# Patient Record
Sex: Male | Born: 1950 | Race: White | Hispanic: No | Marital: Married | Smoking: Former smoker
Health system: Southern US, Community
[De-identification: ages and names within clinical notes are randomized; demographics above are authoritative.]

## PROBLEM LIST (undated history)

## (undated) DIAGNOSIS — Z8709 Personal history of other diseases of the respiratory system: Secondary | ICD-10-CM

## (undated) DIAGNOSIS — M1990 Unspecified osteoarthritis, unspecified site: Secondary | ICD-10-CM

## (undated) DIAGNOSIS — I779 Disorder of arteries and arterioles, unspecified: Secondary | ICD-10-CM

## (undated) DIAGNOSIS — E039 Hypothyroidism, unspecified: Secondary | ICD-10-CM

## (undated) DIAGNOSIS — K760 Fatty (change of) liver, not elsewhere classified: Secondary | ICD-10-CM

## (undated) DIAGNOSIS — I209 Angina pectoris, unspecified: Secondary | ICD-10-CM

## (undated) DIAGNOSIS — G43909 Migraine, unspecified, not intractable, without status migrainosus: Secondary | ICD-10-CM

## (undated) DIAGNOSIS — IMO0001 Reserved for inherently not codable concepts without codable children: Secondary | ICD-10-CM

## (undated) DIAGNOSIS — Z973 Presence of spectacles and contact lenses: Secondary | ICD-10-CM

## (undated) DIAGNOSIS — G629 Polyneuropathy, unspecified: Secondary | ICD-10-CM

## (undated) DIAGNOSIS — E7800 Pure hypercholesterolemia, unspecified: Secondary | ICD-10-CM

## (undated) DIAGNOSIS — E049 Nontoxic goiter, unspecified: Secondary | ICD-10-CM

## (undated) DIAGNOSIS — J45909 Unspecified asthma, uncomplicated: Secondary | ICD-10-CM

## (undated) DIAGNOSIS — I10 Essential (primary) hypertension: Secondary | ICD-10-CM

## (undated) DIAGNOSIS — I2510 Atherosclerotic heart disease of native coronary artery without angina pectoris: Secondary | ICD-10-CM

## (undated) DIAGNOSIS — E782 Mixed hyperlipidemia: Secondary | ICD-10-CM

## (undated) DIAGNOSIS — I6529 Occlusion and stenosis of unspecified carotid artery: Secondary | ICD-10-CM

## (undated) DIAGNOSIS — K219 Gastro-esophageal reflux disease without esophagitis: Secondary | ICD-10-CM

## (undated) DIAGNOSIS — Z8701 Personal history of pneumonia (recurrent): Secondary | ICD-10-CM

## (undated) DIAGNOSIS — S3992XA Unspecified injury of lower back, initial encounter: Secondary | ICD-10-CM

## (undated) DIAGNOSIS — B9681 Helicobacter pylori [H. pylori] as the cause of diseases classified elsewhere: Secondary | ICD-10-CM

## (undated) DIAGNOSIS — K279 Peptic ulcer, site unspecified, unspecified as acute or chronic, without hemorrhage or perforation: Secondary | ICD-10-CM

## (undated) DIAGNOSIS — I639 Cerebral infarction, unspecified: Secondary | ICD-10-CM

## (undated) DIAGNOSIS — J449 Chronic obstructive pulmonary disease, unspecified: Secondary | ICD-10-CM

## (undated) DIAGNOSIS — Z87442 Personal history of urinary calculi: Secondary | ICD-10-CM

## (undated) DIAGNOSIS — J189 Pneumonia, unspecified organism: Secondary | ICD-10-CM

## (undated) HISTORY — DX: Cerebral infarction, unspecified: I63.9

## (undated) HISTORY — PX: CARPAL TUNNEL RELEASE: SHX101

## (undated) HISTORY — DX: Essential (primary) hypertension: I10

## (undated) HISTORY — DX: Personal history of pneumonia (recurrent): Z87.01

## (undated) HISTORY — DX: Fatty (change of) liver, not elsewhere classified: K76.0

## (undated) HISTORY — DX: Hypothyroidism, unspecified: E03.9

## (undated) HISTORY — DX: Peptic ulcer, site unspecified, unspecified as acute or chronic, without hemorrhage or perforation: K27.9

## (undated) HISTORY — DX: Pure hypercholesterolemia, unspecified: E78.00

## (undated) HISTORY — DX: Occlusion and stenosis of unspecified carotid artery: I65.29

## (undated) HISTORY — DX: Disorder of arteries and arterioles, unspecified: I77.9

## (undated) HISTORY — PX: FRACTURE SURGERY: SHX138

## (undated) HISTORY — DX: Helicobacter pylori (H. pylori) as the cause of diseases classified elsewhere: B96.81

## (undated) HISTORY — DX: Polyneuropathy, unspecified: G62.9

## (undated) HISTORY — DX: Nontoxic goiter, unspecified: E04.9

## (undated) HISTORY — DX: Mixed hyperlipidemia: E78.2

## (undated) HISTORY — PX: CAROTID ENDARTERECTOMY: SUR193

## (undated) HISTORY — PX: OTHER SURGICAL HISTORY: SHX169

## (undated) HISTORY — DX: Chronic obstructive pulmonary disease, unspecified: J44.9

## (undated) HISTORY — DX: Atherosclerotic heart disease of native coronary artery without angina pectoris: I25.10

## (undated) HISTORY — DX: Angina pectoris, unspecified: I20.9

---
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
## 1990-11-20 HISTORY — PX: SHOULDER OPEN ROTATOR CUFF REPAIR: SHX2407

## 1997-11-20 HISTORY — PX: SHOULDER ARTHROSCOPY W/ ROTATOR CUFF REPAIR: SHX2400

## 1998-11-20 HISTORY — PX: COLECTOMY: SHX59

## 2001-01-18 ENCOUNTER — Encounter: Payer: Self-pay | Admitting: Emergency Medicine

## 2001-01-18 ENCOUNTER — Emergency Department (HOSPITAL_COMMUNITY): Admission: EM | Admit: 2001-01-18 | Discharge: 2001-01-18 | Payer: Self-pay | Admitting: Emergency Medicine

## 2001-01-28 ENCOUNTER — Encounter: Payer: Self-pay | Admitting: Orthopedic Surgery

## 2001-01-29 ENCOUNTER — Ambulatory Visit (HOSPITAL_COMMUNITY): Admission: RE | Admit: 2001-01-29 | Discharge: 2001-01-30 | Payer: Self-pay | Admitting: Orthopedic Surgery

## 2001-01-29 ENCOUNTER — Encounter: Payer: Self-pay | Admitting: Orthopedic Surgery

## 2001-03-27 ENCOUNTER — Ambulatory Visit (HOSPITAL_COMMUNITY): Admission: RE | Admit: 2001-03-27 | Discharge: 2001-03-27 | Payer: Self-pay | Admitting: Orthopedic Surgery

## 2001-07-16 ENCOUNTER — Ambulatory Visit (HOSPITAL_COMMUNITY): Admission: RE | Admit: 2001-07-16 | Discharge: 2001-07-16 | Payer: Self-pay | Admitting: Orthopedic Surgery

## 2001-10-20 ENCOUNTER — Encounter: Payer: Self-pay | Admitting: Internal Medicine

## 2001-10-20 ENCOUNTER — Emergency Department (HOSPITAL_COMMUNITY): Admission: EM | Admit: 2001-10-20 | Discharge: 2001-10-20 | Payer: Self-pay | Admitting: Internal Medicine

## 2001-11-20 DIAGNOSIS — S3992XA Unspecified injury of lower back, initial encounter: Secondary | ICD-10-CM

## 2001-11-20 HISTORY — DX: Unspecified injury of lower back, initial encounter: S39.92XA

## 2002-01-30 ENCOUNTER — Ambulatory Visit (HOSPITAL_COMMUNITY): Admission: RE | Admit: 2002-01-30 | Discharge: 2002-01-30 | Payer: Self-pay | Admitting: Internal Medicine

## 2002-01-30 ENCOUNTER — Encounter: Payer: Self-pay | Admitting: Internal Medicine

## 2002-05-29 ENCOUNTER — Encounter: Payer: Self-pay | Admitting: Family Medicine

## 2002-05-29 ENCOUNTER — Ambulatory Visit (HOSPITAL_COMMUNITY): Admission: RE | Admit: 2002-05-29 | Discharge: 2002-05-29 | Payer: Self-pay | Admitting: Family Medicine

## 2002-06-20 HISTORY — PX: ORIF SHOULDER FRACTURE: SHX5035

## 2002-06-26 ENCOUNTER — Emergency Department (HOSPITAL_COMMUNITY): Admission: EM | Admit: 2002-06-26 | Discharge: 2002-06-26 | Payer: Self-pay | Admitting: Emergency Medicine

## 2002-06-26 ENCOUNTER — Encounter: Payer: Self-pay | Admitting: General Surgery

## 2002-06-26 ENCOUNTER — Inpatient Hospital Stay (HOSPITAL_COMMUNITY): Admission: EM | Admit: 2002-06-26 | Discharge: 2002-07-03 | Payer: Self-pay | Admitting: *Deleted

## 2002-06-26 ENCOUNTER — Encounter: Payer: Self-pay | Admitting: Emergency Medicine

## 2002-06-27 ENCOUNTER — Encounter: Payer: Self-pay | Admitting: Orthopedic Surgery

## 2002-06-30 ENCOUNTER — Encounter: Payer: Self-pay | Admitting: Orthopedic Surgery

## 2003-02-19 ENCOUNTER — Encounter: Payer: Self-pay | Admitting: Orthopedic Surgery

## 2003-02-24 ENCOUNTER — Ambulatory Visit (HOSPITAL_COMMUNITY): Admission: RE | Admit: 2003-02-24 | Discharge: 2003-02-25 | Payer: Self-pay | Admitting: Orthopedic Surgery

## 2003-02-24 ENCOUNTER — Encounter: Payer: Self-pay | Admitting: Orthopedic Surgery

## 2004-08-02 ENCOUNTER — Ambulatory Visit (HOSPITAL_COMMUNITY): Admission: RE | Admit: 2004-08-02 | Discharge: 2004-08-02 | Payer: Self-pay | Admitting: Family Medicine

## 2004-08-08 ENCOUNTER — Ambulatory Visit (HOSPITAL_COMMUNITY): Admission: RE | Admit: 2004-08-08 | Discharge: 2004-08-08 | Payer: Self-pay | Admitting: Family Medicine

## 2004-08-24 ENCOUNTER — Ambulatory Visit (HOSPITAL_COMMUNITY): Admission: RE | Admit: 2004-08-24 | Discharge: 2004-08-24 | Payer: Self-pay | Admitting: Internal Medicine

## 2004-08-24 HISTORY — PX: COLONOSCOPY: SHX174

## 2006-02-26 ENCOUNTER — Ambulatory Visit (HOSPITAL_COMMUNITY): Admission: RE | Admit: 2006-02-26 | Discharge: 2006-02-26 | Payer: Self-pay | Admitting: Family Medicine

## 2007-05-13 ENCOUNTER — Emergency Department (HOSPITAL_COMMUNITY): Admission: EM | Admit: 2007-05-13 | Discharge: 2007-05-13 | Payer: Self-pay | Admitting: Emergency Medicine

## 2008-02-21 ENCOUNTER — Ambulatory Visit (HOSPITAL_COMMUNITY): Admission: RE | Admit: 2008-02-21 | Discharge: 2008-02-21 | Payer: Self-pay | Admitting: Family Medicine

## 2008-02-26 ENCOUNTER — Ambulatory Visit: Payer: Self-pay | Admitting: Vascular Surgery

## 2008-03-09 ENCOUNTER — Inpatient Hospital Stay (HOSPITAL_COMMUNITY): Admission: RE | Admit: 2008-03-09 | Discharge: 2008-03-10 | Payer: Self-pay | Admitting: Vascular Surgery

## 2008-03-09 ENCOUNTER — Encounter: Payer: Self-pay | Admitting: Vascular Surgery

## 2008-03-09 ENCOUNTER — Ambulatory Visit: Payer: Self-pay | Admitting: Vascular Surgery

## 2008-03-27 ENCOUNTER — Ambulatory Visit: Payer: Self-pay | Admitting: Vascular Surgery

## 2008-04-21 ENCOUNTER — Ambulatory Visit (HOSPITAL_COMMUNITY): Admission: RE | Admit: 2008-04-21 | Discharge: 2008-04-21 | Payer: Self-pay | Admitting: Family Medicine

## 2008-10-02 ENCOUNTER — Ambulatory Visit: Payer: Self-pay | Admitting: Vascular Surgery

## 2009-01-25 ENCOUNTER — Emergency Department (HOSPITAL_COMMUNITY): Admission: EM | Admit: 2009-01-25 | Discharge: 2009-01-25 | Payer: Self-pay | Admitting: Emergency Medicine

## 2009-10-22 ENCOUNTER — Ambulatory Visit: Payer: Self-pay | Admitting: Vascular Surgery

## 2010-11-25 ENCOUNTER — Ambulatory Visit: Admit: 2010-11-25 | Payer: Self-pay | Admitting: Vascular Surgery

## 2011-02-17 ENCOUNTER — Other Ambulatory Visit (HOSPITAL_COMMUNITY): Payer: Self-pay | Admitting: Family Medicine

## 2011-02-17 ENCOUNTER — Ambulatory Visit (HOSPITAL_COMMUNITY)
Admission: RE | Admit: 2011-02-17 | Discharge: 2011-02-17 | Disposition: A | Discharge status: Home / Self Care | Payer: Self-pay | Source: Ambulatory Visit | Attending: Family Medicine | Admitting: Family Medicine

## 2011-02-17 DIAGNOSIS — S298XXA Other specified injuries of thorax, initial encounter: Secondary | ICD-10-CM | POA: Insufficient documentation

## 2011-02-17 DIAGNOSIS — R079 Chest pain, unspecified: Secondary | ICD-10-CM

## 2011-02-17 DIAGNOSIS — W19XXXA Unspecified fall, initial encounter: Secondary | ICD-10-CM | POA: Insufficient documentation

## 2011-02-17 DIAGNOSIS — R0789 Other chest pain: Secondary | ICD-10-CM | POA: Insufficient documentation

## 2011-03-02 LAB — BASIC METABOLIC PANEL
BUN: 15 mg/dL (ref 6–23)
CO2: 27 mEq/L (ref 19–32)
Chloride: 101 mEq/L (ref 96–112)
Creatinine, Ser: 0.84 mg/dL (ref 0.4–1.5)
Potassium: 4.3 mEq/L (ref 3.5–5.1)

## 2011-03-02 LAB — POCT CARDIAC MARKERS

## 2011-03-02 LAB — CBC
HCT: 44.3 % (ref 39.0–52.0)
MCHC: 34.7 g/dL (ref 30.0–36.0)
MCV: 90.5 fL (ref 78.0–100.0)
Platelets: 178 10*3/uL (ref 150–400)
RBC: 4.9 MIL/uL (ref 4.22–5.81)

## 2011-03-02 LAB — D-DIMER, QUANTITATIVE: D-Dimer, Quant: 0.29 ug/mL-FEU (ref 0.00–0.48)

## 2011-04-04 NOTE — Op Note (Signed)
Seth Savage, PAYER               ACCOUNT NO.:  [id number]   MEDICAL RECORD NO.:  [id number]          PATIENT TYPE:  INP   LOCATION:  3301                         FACILITY:  [facility]   PHYSICIAN:  Larina Earthly, M.D.    DATE OF BIRTH:  1951-08-12   DATE OF PROCEDURE:  03/09/2008  DATE OF DISCHARGE:                               OPERATIVE REPORT   PREOPERATIVE DIAGNOSIS:  Left carotid stenosis, severe, asymptomatic.   POSTOPERATIVE DIAGNOSIS:  Left carotid stenosis, severe, asymptomatic.   PROCEDURE:  Left carotid endarterectomy and Dacron patch angioplasty.   SURGEON:  Larina Earthly, MD.   ASSISTANTS:  1. Di Kindle. Edilia Bo, MD.  2. Wilmon Arms, PA-C.   ANESTHESIA:  General endotracheal.   COMPLICATIONS:  None.   DISPOSITION:  To recovery room, neurologically intact.   PROCEDURE IN DETAIL:  The patient was taken to the operating room,  placed in supine position.  The left neck was prepped and draped in  usual sterile fashion.  Incision was made in the anterior  sternocleidomastoid and carried down through the platysmal left cautery.  Sternocleidomastoid was reflected posteriorly and the carotid sheath was  opened.  Facial vein was ligated with 2-0 silk ties and divided.  The  common carotid artery was encircled with an umbilical tape, Rumel  tourniquet.  Dissection was carried on to the bifurcation.  The vagus  and hypoglossal nerves were identified and preserved.  The superior  thyroid artery was encircled with a 2-0 silk Potts tie, the external  carotid circled with a blue vessel loop, the internal carotid circled  with umbilical tape and Rumel tourniquet.  The patient was given 9000  units of intravenous heparin.  After adequate circulation time, the  internal and external common carotid arteries were occluded.  The common  carotid artery was opened with an 11 blade extending using with Potts  scissors through the plaque on to the internal carotid with Potts  scissors.  A 10 shunt was passed up the internal carotid artery, allowed  the back bleed, then down the common carotid artery where it was secured  the Rumel tourniquets.  The endarterectomy was begun on the common  carotid artery and the proximal plaques were divided with Potts  scissors.  The endarterectomy was continued onto the bifurcation.  The  external carotid was endarterectomized with eversion technique and the  internal carotid was endarterectomized in an open fashion.  Remaining  atheromatous debris was removed from endarterectomy plane.  The Finesse  Hemashield Dacron patch was brought into the field and sewn as patch  angioplasty with a running 6-0 Prolene suture.  Prior to completion of  the anastomosis, the flushing maneuvers were undertaken.  The  anastomosis was completed.  Flow was restored first to the external,  then the internal carotid artery.  Excellent flow characteristics were  noted with handheld Doppler in the internal and external carotid  arteries.  The patient was given 50 mg of protamine to reverse heparin.  The wounds were irrigated with saline.  Hemostasis with electrocautery.  Wounds were closed with 3-0 Vicryl reapproximated  sternocleidomastoid with the carotid sheath.  Next, platysma was closed  with running 3-0 Vicryl suture and following this, the skin was closed  with 4-0 subcuticular Vicryl stitch.  Sterile dressing was applied.  The  patient was taken to the recovery in stable condition.      Larina Earthly, M.D.  Electronically Signed     TFE/MEDQ  D:  03/09/2008  T:  03/10/2008  Job:  [id number]   cc:   Kirk Ruths, M.D.  Madelin Rear. Sherwood Gambler, MD

## 2011-04-04 NOTE — Assessment & Plan Note (Signed)
OFFICE VISIT   Driggs, Atanacio S  DOB:  [birth year]                                       10/02/2008  ZOXWR#:[id number]   The patient presents today for follow-up of his left carotid  endarterectomy with Dacron patch angioplasty on 03/09/2008.  He  continues to do quite well.  He has continued his smoking cessation.  He  reports no neurologic deficits.  He does report an occasional tingling  sensation in his left anterior chest but does not relate this to  exercise.  His health status is otherwise unchanged.   PHYSICAL EXAM:  He is a well-developed, well-nourished white male  appearing stated age 60.  His blood pressure is 142/88, pulse 73,  respirations 18.  His radial pulses are 2+ bilaterally.  He is grossly  intact neurologically.  His left neck incision is well-healed and he has  no carotid bruits bilaterally.   He underwent carotid duplex in our office and this reveals a wide  patency of his left endarterectomy with no evidence of restenosis.  He  does have a known 40-60% stenosis in his right internal carotid artery.  He will continue his usual activities and we will see him in our  vascular lab protocol at yearly intervals.  He will notify us  immediately should he develop any new neurologic deficits.   Larina Earthly, M.D.  Electronically Signed   TFE/MEDQ  D:  10/02/2008  T:  10/05/2008  Job:  [id number]   cc:   Kirk Ruths, M.D.

## 2011-04-04 NOTE — Procedures (Signed)
CAROTID DUPLEX EXAM   INDICATION:  Followup known carotid artery disease.   HISTORY:  Diabetes:  No.  Cardiac:  No.  Hypertension:  Yes.  Smoking:  Yes.  Previous Surgery:  No.  CV History:  No.  Amaurosis Fugax No, Paresthesias No, Hemiparesis No                                       RIGHT             LEFT  Brachial systolic pressure:         118               118  Brachial Doppler waveforms:         Biphasic          Biphasic  Vertebral direction of flow:        Antegrade         Antegrade  DUPLEX VELOCITIES (cm/sec)  CCA peak systolic                   88                83  ECA peak systolic                   178               224  ICA peak systolic                   124               327  ICA end diastolic                   37                118  PLAQUE MORPHOLOGY:                  Heterogenous      Heterogenous  PLAQUE AMOUNT:                      Mild              Severe  PLAQUE LOCATION:                    ICA and ECA       ICA and ECA   IMPRESSION:  1. 80-99% stenosis noted at the left bifurcation/proximal ICA.  2. 40-59% stenosis noted in the right ICA.  3. Antegrade bilateral vertebral arteries.   ___________________________________________  Larina Earthly, M.D.   MG/MEDQ  D:  02/26/2008  T:  02/26/2008  Job:  [id number]

## 2011-04-04 NOTE — Discharge Summary (Signed)
Seth Savage, Seth Savage               ACCOUNT NO.:  [id number]   MEDICAL RECORD NO.:  [id number]          PATIENT TYPE:  INP   LOCATION:  3301                         FACILITY:  [facility]   PHYSICIAN:  Larina Earthly, M.D.    DATE OF BIRTH:  12-09-1950   DATE OF ADMISSION:  03/09/2008  DATE OF DISCHARGE:  03/10/2008                               DISCHARGE SUMMARY   REFERRING CARDIOLOGIST:  Nanetta Batty, M.D.   DISCHARGE DIAGNOSES:  1. Left carotid occlusive disease.  2. Hypothyroid.  3. History of migraines.  4. History of tobacco abuse.   PROCEDURE PERFORMED:  March 09, 2008, left carotid endarterectomy by Dr.  Arbie Cookey.   DISCHARGE MEDICATIONS:  1. Synthroid 150 mcg p.o. daily.  2. Benicar 200 mg p.o. daily.  3. Vytorin 10/20 one p.o. nightly.  4. Aspirin 325 mg p.o. daily.  5. Percocet 5/325 one p.o. q.4 h. p.r.n. pain, 20 were given.   DISPOSITION:  He has been discharged home with his wounds healing well.  He is instructed not to drive for 3 weeks or lift heavy objects for 3  weeks.  He is to clean his wounds with soap and warm water.  He is to  observe the wound for drainage, increasing redness, pain, fever greater  than 101.2, or neurological changes.  He is to return to see Dr. Arbie Cookey  in 2 weeks in the office, we will call him for an appointment.   BRIEF IDENTIFYING STATEMENT:  For complete details, please refer to the  typed history and physical.  Briefly, this very pleasant 60 year old  gentleman was referred to Dr. Arbie Cookey with traumatic left carotid  occlusive disease.  Dr. Arbie Cookey evaluated him and found him to be a  suitable candidate for left carotid endarterectomy.  He was informed of  the risks and benefits of the procedure, and after careful  consideration, elected to proceed with surgery.   HOSPITAL COURSE:  Preoperative workup was completed as an outpatient.  He was brought in through [facility] and underwent the  aforementioned left carotid endarterectomy.   For complete details,  please refer to the typed operative report.  Procedure was without  complications.  He was returned to the  postanesthesia care unit,  extubated.  Following stabilization, he was transferred to a bed on a  surgical stepdown unit.  He was observed overnight.  The following  morning, his wound was healing well.  Neurologically, he was intact,  although he did have a very slight deviation of his tongue to the left.  He was found stable and was discharged home.      Wilmon Arms, PA      Larina Earthly, M.D.  Electronically Signed    KEL/MEDQ  D:  03/10/2008  T:  03/10/2008  Job:  [id number]   cc:   Kirk Ruths, M.D.  Madelin Rear. Sherwood Gambler, MD

## 2011-04-04 NOTE — Assessment & Plan Note (Signed)
OFFICE VISIT   Seth Savage, Seth Savage  DOB:  [birth year]                                       03/27/2008  ZOXWR#:[id number]   The patient presents today for followup of his left carotid  endarterectomy on 03/09/2008.  He did well and was discharged to home on  postoperative day 1.  He does have the usual amount of peri-incisional  soreness.  He did have a recurrence of migraine headache following his  endarterectomy and this has now resolved.  He does report that he is no  longer having pulsatile tenderness that he had preop.   PHYSICAL EXAMINATION:  His neck is well-healed.  He has no bruits.  He  is grossly intact neurologically.   He will continue his usual activity.  I plan to see him again in 6  months with repeat carotid duplex at that time.   Larina Earthly, M.D.  Electronically Signed   TFE/MEDQ  D:  03/27/2008  T:  03/28/2008  Job:  [id number]   cc:   Madelin Rear. Sherwood Gambler, MD

## 2011-04-04 NOTE — H&P (Signed)
HISTORY AND PHYSICAL EXAMINATION   February 26, 2008   Re:  Seth, Savage               DOB:  1951-09-10   ADMITTING DIAGNOSIS:  Severe asymptomatic left internal carotid artery  stenosis.   HISTORY OF PRESENT ILLNESS:  The patient is an active, healthy 60-year-  old white male who was found to have carotid bruit on physical  examination.  He underwent carotid duplex at [facility] and  this revealed critical left internal carotid artery stenosis and mild to  moderate right carotid stenosis.  He denies any prior symptoms related  to this, specifically any amaurosis fugax, transient ischemic attack or  stroke.  He has had some bilateral blurred vision in the past which he  attributes to medication.  He denies any prior cardiac history.  He is  hypertensive on medication and also has elevated cholesterol on  medication for this as well.   FAMILY HISTORY:  He denies any premature atherosclerotic disease in his  family.  He did have a brother with stroke secondary to trauma.   SOCIAL HISTORY:  He is married.  He works as a Music therapist.  He does smoke  currently.  He does drink approximately 3 beers per month.   REVIEW OF SYSTEMS:  CONSTITUTIONAL:  His weight is stated at 218 pounds.  Height is 6 feet tall.  CARDIOVASCULAR:  Noted for some occasional palpitations.  He has no  pulmonary, GI or GU symptoms.  NEUROLOGICAL:  He occasionally has dizziness.  ORTHOPEDICS:  He has had multiple orthopedic injuries and arthritic  changes.   MEDICATION ALLERGIES:  LYRICA.   CURRENT MEDICATIONS:  Synthroid, Vytorin and an unknown antihypertensive  medication.   PHYSICAL EXAMINATION:  General:  A well-developed, well-nourished white  male appearing his stated age of 48.  Vital signs:  His blood pressure  122/78 in his left arm and 126/83 in his right arm.  Pulse is 80,  respirations 18.  Neurological:  He is grossly intact neurologically.  His carotid arteries do  show a soft left bruit, no bruit on the right.  Radial pulses are 2+.  He has 2+ femoral and 2+ dorsalis pedis pulses  bilaterally.  Heart:  Regular rate and rhythm without murmur.  Chest:  Clear bilaterally without any rales.  Abdomen:  Exam reveals no  aneurysm.  He has no organomegaly and no tenderness.   I discussed this with the patient.  I explained the significance of his  severe asymptomatic left carotid disease.  He is right-handed.  I  explained this puts his dominant hemisphere at risk.  I explained that  we currently are proceeding with carotid endarterectomy if we can  confirm normal internal carotid artery above the level of plaque.  He  underwent confirmatory duplex in our office and this did concur with  severe stenosis in his left internal carotid artery and normal internal  carotid distal to this.  I have recommended endarterectomy for reduction  of stroke risk.  I explained the procedure to include 1-2% risk of  stroke with surgery and also very low risk for cranial nerve injury.  He  understands and wishes to proceed.  We will schedule this at his  convenience on April 20 for admission day surgery at [facility].  He understands anticipation of discharge on postoperative day  1.  He will continue his aspirin therapy up until the time of surgery.   Tawanna Cooler  Shirline Frees, M.D.  Electronically Signed   TFE/MEDQ  D:  02/26/2008  T:  02/27/2008  Job:  [id number]   cc:   Kirk Ruths, M.D.  Madelin Rear. Sherwood Gambler, MD

## 2011-04-04 NOTE — Procedures (Signed)
CAROTID DUPLEX EXAM   INDICATION:  Follow-up left carotid endarterectomy.   HISTORY:  Diabetes:  No.  Cardiac:  No.  Hypertension:  Yes.  Smoking:  Yes.  Previous Surgery:  Left carotid endarterectomy.  CV History:  Amaurosis Fugax No, Paresthesias No, Hemiparesis No.                                       RIGHT             LEFT  Brachial systolic pressure:         120               122  Brachial Doppler waveforms:         Biphasic          Biphasic  Vertebral direction of flow:        Antegrade         Antegrade  DUPLEX VELOCITIES (cm/sec)  CCA peak systolic                   104               147  ECA peak systolic                   246               100  ICA peak systolic                   143               84  ICA end diastolic                   36                36  PLAQUE MORPHOLOGY:                  Heterogeneous     None  PLAQUE AMOUNT:                      Moderate          None  PLAQUE LOCATION:                    ICA and ECA       None   IMPRESSION:  1. A 40-59% stenosis noted in the right ICA.  2. Normal carotid duplex noted in the left ICA.  3. Status post left carotid endarterectomy.  4. Antegrade bilateral vertebral arteries.   ___________________________________________  Larina Earthly, M.D.   MG/MEDQ  D:  10/02/2008  T:  10/02/2008  Job:  [id number]

## 2011-04-04 NOTE — Procedures (Signed)
CAROTID DUPLEX EXAM   INDICATION:  Follow up of carotid disease.   HISTORY:  Diabetes:  No.  Cardiac:  No.  Hypertension:  Yes.  Smoking:  Yes.  Previous Surgery:  Left carotid endarterectomy.  CV History:  Asymptomatic.  Amaurosis Fugax No, Paresthesias No, Hemiparesis No.                                       RIGHT             LEFT  Brachial systolic pressure:         125               122  Brachial Doppler waveforms:         Triphasic         Triphasic  Vertebral direction of flow:        Antegrade         Antegrade  DUPLEX VELOCITIES (cm/sec)  CCA peak systolic                   112               154  ECA peak systolic                   154               65  ICA peak systolic                   174               62  ICA end diastolic                   48                15  PLAQUE MORPHOLOGY:                  Mixed             None  PLAQUE AMOUNT:                      Moderate          None  PLAQUE LOCATION:                    ICA   IMPRESSION:  1. 40-59% stenosis noted in the right internal carotid artery.  2. No evidence of restenosis of the left internal carotid artery.        ___________________________________________  Larina Earthly, M.D.   CJ/MEDQ  D:  10/22/2009  T:  10/22/2009  Job:  [id number]

## 2011-04-07 NOTE — Op Note (Signed)
NAME:  Seth Savage, Seth Savage                         ACCOUNT NO.:  [id number]   MEDICAL RECORD NO.:  [id number]                   PATIENT TYPE:  INP   LOCATION:  5033                                 FACILITY:  [facility]   PHYSICIAN:  Burnard Bunting, M.D.                 DATE OF BIRTH:  Sep 29, 1951   DATE OF PROCEDURE:  02/24/2003  DATE OF DISCHARGE:  02/25/2003                                 OPERATIVE REPORT   PREOPERATIVE DIAGNOSIS:  Nonunion, right distal radius fracture.   POSTOPERATIVE DIAGNOSIS:  Nonunion, right distal radius fracture.   PROCEDURE:  Right distal radius fracture removal of hardware with replating  and iliac crest bone grafting supplemented by platelet derived growth  factor.   SURGEON:  Burnard Bunting, M.D.   ANESTHESIA:  General endotracheal.   ESTIMATED BLOOD LOSS:  100 mL   DRAINS:  None.   TOURNIQUET TIME:  2 hours and 10 minutes at 250 mmHg.   DESCRIPTION OF PROCEDURE:  The patient was brought to the operating room  where general endotracheal anesthesia was induced. Preoperative IV  antibiotics were administered. The right arm and right iliac crest were  prepped with Duraprep solution and draped in a sterile manner. Collier Flowers was  used to cover the operative field. The iliac crest was first approached  through a 4 cm incision about 2 cm proximal to the anterior superior iliac  crest. The skin and subcutaneous tissue was sharply divided. The avascular  plane from the anterolateral edge of the iliac crest was identified,  periosteal elevation was used to develop full-thickness flaps off of the  iliac crest. A wet sponge was placed in the incision. This time the arm was  elevated and exsanguinated with the Esmarch wrap and the tourniquet was  inflated. A prior incision was utilized. The FCR tendon was visualized. The  FCR tendon and radial artery were mobilized radialward. The median nerve was  visualized and mobilized ulnarward. The blunt dissection was performed  down  to the radius. The plate was then visualized and the screws were removed.  This did require gentle retraction on the median nerve. All screws were  removed. Two of the screws were actually loose. These were removed. At this  time, the bony nonunion site was identified. It was debrided of intervening  fibrous tissue. At this point, using a combination of distraction and ulnar  deviation, an attempt was made to restore radial height, inclination and  offset. This was very difficult due to shortening of the fracture. A K wire  was placed in order to hold the reduced distal radial piece. This was  supplemented with a laminar spreader to achieve distraction. All in all due  to the patient's prior fracture deformity, it was difficult to achieve  radial length and inclination; however, the articular surface did achieve  near neutral angulation. At this time, the plate was then applied with  full-  thickness bicortical screws placed in the distal fragment. Again due to the  size of the distal fragment and the way it tapered towards the ulna, it was  difficult to achieve optimal placement of the plate due to the patient's  distorted anatomy. Contouring of the plate to the shape of the distal radius  would not allow for bicortical fixation, less contouring of the plate would  not allow for secure fixation of the screws proximally into the shaft,  compromise was achieved. The plate also required different angulation from  its original placement in order to achieve purchase in screw holes.  Following plate fixation with the DVR plate, the iliac crest bone was  harvested. This was supplemented with platelet derived growth factor from  the patient's own platelets. This flurry was placed into the nonunion site.  The full amount of bone graft was utilized. This gone graft was harvested  using osteotomes and a window technique from the iliac crest followed by  curettes. The platelet  IV factor was then  placed into the hip incision for  hemostasis. The incision was then closed using #0 figure-of-eight sutures  followed by interrupted inverted 2-0 Vicryl sutures and skin staples. At  this time, the tourniquet on the wrist was released, bleeding point  encountered and were controlled using electrocautery. The incision was  thoroughly irrigated prior to bone graft placement. Good hemostasis was  achieved. The median nerve was intact at the time of closure, skin was  closed using interrupted inverted 2-0 Vicryl followed by interrupted 3-0  nylon sutures. The patient was placed in a sugar-tong splint. The hand  perfused nicely after tourniquet released. The patient tolerated the  procedure well without immediate complications. He was transferred to the  recovery room in stable condition.                                               Burnard Bunting, M.D.    GSD/MEDQ  D:  02/26/2003  T:  02/28/2003  Job:  [id number]

## 2011-04-07 NOTE — H&P (Signed)
[place]  Patient:    Seth Savage, Seth Savage                      MRN: [id number] Adm. Date:  [id number] Disc. Date: [id number] Attending:  Wende Mott                         History and Physical  CHIEF COMPLAINT:  Hardware external fixated bilateral wrist and forearm.  HISTORY OF PRESENT ILLNESS:  This is a 60 year old who had sustained injury to both wrists with severe Colles fractures treated with manipulated reduction and external fixation.  Patient returns presently for removal of the pins and external fixators bilateral wrists and forearm.  PAST MEDICAL HISTORY:  Bilateral wrist fractures with manipulated reduction and external fixation.  Patient also has thyroid disease.  He has had bilateral shoulder arthroscopy and history of introsusception of bowel.  ALLERGIES:  None known.  MEDICATIONS:  Celebrex, Darvocet-N 100, Synthroid 0.125 daily.  HABITS:  Patient drinks beer occasionally.  Smokes one-half pack per day x 28 years.  FAMILY HISTORY:  Not contributory.  REVIEW OF SYSTEMS:  Some shoulder discomfort with stiffness.  No cardiac or respiratory, no urinary or bowel symptoms.  Occasional cluster headaches.  PHYSICAL EXAMINATION:  VITAL SIGNS:  Temperature 97.6, pulse 88, respirations 16, blood pressure 130/80.  Height 6 feet, weight 211.  HEENT:  Normocephalic, ______, sclerae clear.  NECK:  Supple.  CHEST:  Clear.  CARDIAC:  S1, S2, regular.  EXTREMITIES:  Bilateral forearm and wrist with external fixators.  Wounds have healed quite well.  Good finger movement.  Notable swelling of the fingers. Neurovascular status is intact.  No sign of pin tracking.  IMPRESSION:  Colles fracture bilateral wrists with external fixators. DD:  03/27/01 TD:  03/27/01 Job: [id number]

## 2011-04-07 NOTE — Op Note (Signed)
[place]  Patient:    Seth Savage, Seth Savage                      MRN: [id number] Proc. Date: 03/27/01 Adm. Date:  [id number] Disc. Date: [id number] Attending:  Wende Mott                           Operative Report  PREOPERATIVE DIAGNOSIS:  External fixators of bilateral wrists with Colles fractures.  POSTOPERATIVE DIAGNOSIS:  External fixators of bilateral wrists with Colles fractures.  ANESTHESIA:  General.  PROCEDURE:  Removal of pins and external fixator, bilateral wrists and forearms.  DESCRIPTION:  Patient was taken to the operating room after given adequate preoperative medication and given general anesthesia.  Both forearms were scrubbed with Betadine scrub and painted with Betadine solution, draped in a sterile manner.   External fixators were removed and the four pins with both forearms were removed.  The wounds were closed and compressive dressing was applied.  An ______ of both wrists and MCP joints of both hands and wrists were done, and good range of motion passively was able to be obtained.  Range of motion is better in the left wrist than in the right wrist.  Patient tolerated the procedure quite well.  Marcaine 0.5% was injected into the wrist.  Patient went to the recovery room in stable and satisfactory condition.  Discharged on Percocet one q.4h. p.r.n. pain, wrist splint bilaterally, and ice pack.  Return to the office in one week.  Patient was discharged in stable and satisfactory condition. DD:  03/27/01 TD:  03/27/01 Job: [id number]

## 2011-04-07 NOTE — Op Note (Signed)
NAME:  Seth Savage, Seth Savage               ACCOUNT NO.:  [id number]   MEDICAL RECORD NO.:  [id number]          PATIENT TYPE:  AMB   LOCATION:  DAY                           FACILITY:  [facility]   PHYSICIAN:  R. Roetta Sessions, M.D. DATE OF BIRTH:  11/18/1951   DATE OF PROCEDURE:  08/24/2004  DATE OF DISCHARGE:                                 OPERATIVE REPORT   INDICATION FOR PROCEDURE:  The patient is a 60 year old gentleman who has  had intermittent low-volume painless hematochezia.  He has never had a  colonoscopy.  There is no family history of colorectal neoplasia.  Please  see my handwritten H&P for more information.   PROCEDURE NOTE:  O2 saturation, blood pressure, pulse, and respirations were  monitored throughout the entire procedure.   CONSCIOUS SEDATION:  Versed 4 mg IV, Demerol 75 mg IV.   INSTRUMENT USED:  Olympus video chip system.   FINDINGS:  Digital exam revealed no abnormalities.   ENDOSCOPIC FINDINGS:  Prep was good.   Rectum:  Examination of the rectal mucosa including a retroflexed view of  the anal verge revealed a normal-appearing rectal mucosa.   Colon:  Colonic mucosa was surveyed from the rectosigmoid junction through  the left, transverse, and right colon, to the area of the appendiceal  orifice, ileocecal valve, and cecum.  These structures were well-seen and  photographed for the record.  From this level the scope was slowly withdrawn  and all previously-mentioned mucosal surfaces were again seen.  There was a  single 5 mm pedunculated polyp at 25 cm that was cold snared.  The remainder  of colonic mucosa appeared normal.  The patient tolerated the procedure  well, was reacted in endoscopy.   IMPRESSION:  1.  Normal rectum.  2.  Small polyp at 25 cm, cold snared.  3.  The remainder of the colonic mucosa appeared normal.  4.  I suspect the patient has experienced trivial bleeding from anorectal      origin.   RECOMMENDATIONS:  1.  A 10-day course of Anusol  suppositories one per rectum at bedtime.  2.  He has had some right upper quadrant pain recently.  A set of LFTs      recently came back completely normal through our office.  I will plan to      see this gentleman back in the office in three weeks for further      evaluation.     Otelia Sergeant   RMR/MEDQ  D:  08/24/2004  T:  08/24/2004  Job:  [id number]   cc:   Kirk Ruths, M.D.  [address]  [place]  [place] [address]  Fax: [phone]

## 2011-04-07 NOTE — Discharge Summary (Signed)
NAME:  Seth Savage, Seth Savage                         ACCOUNT NO.:  [id number]   MEDICAL RECORD NO.:  [id number]                   PATIENT TYPE:  EMS   LOCATION:  ED                                   FACILITY:  [facility]   PHYSICIAN:  Marta Lamas. Lindie Spruce, M.D.                DATE OF BIRTH:  06-17-1951   DATE OF ADMISSION:  DATE OF DISCHARGE:  06/26/2002                                 DISCHARGE SUMMARY   FINAL DIAGNOSIS:  1. Fall.  2. Right radial and ulna fracture.  3. Left humerus fracture.  4. Left knee injury.  5. Right knee joint effusion.  6. Right anterior cruciate ligament injury.  7. Extensive anterior cruciate ligament sprain.   PROCEDURES:  1. Open reduction internal fixation of right ulna and radial fractures done     on 06/27/02 performed by G. Dorene Grebe, M.D.  2. Open reduction internal fixation of left humerus fracture done on     06/27/02 performed by G. Dorene Grebe, M.D.   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  He was initially taken to [facility] and found to have  bilateral extremity fractures.  At that point, he also had a question of C-  spine fracture.  He was subsequently to [facility] where a work-up  was done, showing negative C-spine fracture per x-ray and CT.  Neck CT was  done which was negative as noted.  He had x-rays done which showed the right  proximal radial and ulnar head fractures.  He also had a humerus fracture.  These findings were noted and subsequently Dr. August Saucer was consulted.  Dr. August Saucer  saw the patient and subsequently he was taken to the OR for open reduction  internal fixation of the right wrist fracture.  He then was scheduled to  undergo open reduction internal fixation of left humerus fracture.  These  were done during his stay.  He tolerated the procedures satisfactorily with  no intraoperative complications  occurring.  Postoperatively, the patient did  well.  Movement was limited due to the fractures.  He needed significant  assistance in the hospital and he will need continued assistance outside  with family for ADLs.  This has been worked out with his wife who will be  home during the evening and also coming home and checking him during  lunchtime.  The patient is satisfied with this and is wanting to go at this  time.  He is doing well and is able to get about and ambulate.  At this  point, he is prepared for discharge.   The patient will be given Percocet 1-2 p.o. q.4-6h. p.r.n. for pain.  He  will continue on his other medications that he was taking prior to admission  and he will followup with Dr. August Saucer as indicated by Dr. August Saucer.  As far as  trauma is  concerned, there are no injuries that require followup from our standpoint.  He will be given our phone number and asked to call if he should have any  other questions or any other problems not pertaining to his fractures.  The  patient is subsequently discharged hom in satisfactory and stable condition  on 07/03/2002.     Phineas Semen, PA                        Marta Lamas. Lindie Spruce, M.D.    CL/MEDQ  D:  07/03/2002  T:  07/07/2002  Job:  [id number]   cc:   Marta Lamas. Gae Bon, M.D.  Fax: [phone]   G. Dorene Grebe, M.D.

## 2011-04-07 NOTE — Op Note (Signed)
[place]. [facility]  Patient:    Seth Savage, Seth Savage                      MRN: [id number] Proc. Date: 01/29/01 Adm. Date:  [id number] Disc. Date: [id number] Attending:  Wende Mott                           Operative Report  PREOPERATIVE DIAGNOSIS:  Bilateral comminuted intra-articular Colles fractures.  POSTOPERATIVE DIAGNOSIS:  Bilateral comminuted intra-articular Colles fractures.  SURGEON:  Kennieth Rad, M.D.  ANESTHESIA:  General.  PROCEDURE:  Closed manipulative reduction, bilateral Colles fractures, and external fixation, bilateral wrists.  DESCRIPTION OF PROCEDURE:  Patient was taken to the operating room after giving adequate preop medication and given general anesthesia and intubated. Bilateral wrists were painted with Duraprep and draped in a sterile manner. The right wrist was done first with a puncture wound made over the second metacarpal.  Two pins were placed into the second metacarpal and two pins proximal to the fracture into the radius.  These were done with small puncture wounds with sharp and blunt dissection down to the bone.  After placement of the pins, external fixator was placed and with traction and manipulation, the fracture site was pulled out back to length, with good position in both AP lateral and obliques.  The external fixator was set, holding the fracture site in a good stable and satisfactory alignment.  Next, attention was turned to the left wrist.  Again, skin incision was made over the second metacarpal and with sharp and blunt dissection down to the bone, with placement of two pins in the second metacarpal and two separate puncture wounds in the radius, followed by sharp and blunt dissection down to the bone and placement of two pins into the radius.  External fixator was attached, manipulative reduction and traction applied and anatomic reduction was possible.  Copious irrigation with antibiotic  solution was done.  Some sutures were placed into the punctured areas.  Compressive dressings were applied.  Patient tolerated procedure quite well and went to the recovery room in stable and satisfactory condition.  Patient was discharged to home directly from the recovery room and to return to the office in one week.  Patient was instructed in using a Nerf ball for gripping exercise, elevation, ice pack, continue his Celebrex 200 mg b.i.d., Percocet 10, one q.4h. p.r.n. for pain.  Patient was discharged in stable and satisfactory condition. DD:  02/11/01 TD:  02/12/01 Job: [id number]

## 2011-04-07 NOTE — Op Note (Signed)
NAME:  Seth Savage, Seth Savage                         ACCOUNT NO.:  [id number]   MEDICAL RECORD NO.:  [id number]                   PATIENT TYPE:  INP   LOCATION:  5002                                 FACILITY:  [facility]   PHYSICIAN:  Burnard Bunting, M.D.                 DATE OF BIRTH:  Oct 13, 1951   DATE OF PROCEDURE:  07/08/2002  DATE OF DISCHARGE:                                 OPERATIVE REPORT   PREOPERATIVE DIAGNOSES:  1. Right distal radius fracture.  2. Right ulnar shaft fracture.  3. Carpal tunnel syndrome.   POSTOPERATIVE DIAGNOSES:  1. Right distal radius fracture.  2. Right ulnar shaft fracture.  3. Carpal tunnel syndrome.   PROCEDURES:  1. Open reduction and internal fixation of right distal radius fracture.  2. Open reduction and internal fixation of right ulnar shaft fracture.  3. Carpal tunnel release.   SURGEON:  Eric L. August Saucer, M.D.   ANESTHESIA:  General endotracheal.   ESTIMATED BLOOD LOSS:  50 cc.   TOURNIQUET TIME:  2 hours 7 minutes at 250 mmHg, followed by 45 minutes down  time with subsequent 8-10 minute tourniquet time for the carpal tunnel  release.   DESCRIPTION OF PROCEDURE:  The patient was brought to the operating room,  where general endotracheal anesthesia was induced.  Preoperative IV  antibiotics were administered.  The right arm was prepped with Duraprep  solution and draped in a sterile manner.  An Collier Flowers was used to cover the  operative field.  The arm was elevated and exsanguinated with the Esmarch  wrap.  The tourniquet was inflated.  A skin incision was first made over the  ulna; skin and subcutaneous tissue were sharply divided.  The anterior  cutaneous branch was identified and mobilized posteriorly.  Using periosteal  elevation, the ulnar shaft and fracture were identified.  The ulnar shaft  was anatomically reduced and then fixed with a six-hole Synthes locking  plate.  Excellent purchase was achieved, and anatomic reduction of the  fracture was achieved.  The wrist incision was irrigated.  Care was taken to  avoid traction on the ulnar nerve on the ventral aspect of  the exposure.  The skin was closed using interrupted, inverted 0 Vicryl, followed by a  simple 3-0 nylon suture.  The skin had some swelling, and thus retention  sutures were used.  At this time a volar FCR-splitting incision was utilized  on the wrist.  Extending at the wrist flexion crease and extending  proximally along the course of the FCR, the skin and subcutaneous tissue  were sharply divided.  The FCR tendon sheath was divided on its volar  aspect.  The tendon was then moved radialward and the inferior dorsal aspect  of the FCR tendon sheath was divided.  The FCR tendon and radial artery and  nerve were mobilized radially.  The median nerve and the finger flexors were  mobilized ulnarly.  The pronator quadratus was identified and the fascia  removed from the distal radius.  The periosteum around the fracture site was  elevated.  The patient had had a prior fracture into the distal radius.  The  fracture site was then manually reduced and held in position with a K-wire  placed under fluoroscopic guidance from the radial styloid into the radial  shaft.  Following confirmation of reduction in the AP and lateral planes, a  volar [employer] plate was placed.  Good screw purchase was obtained  in the distal fragment.  Three cortical screws were placed in the radial  shaft.  Correct reduction and non-intra-articular placement of the screws  was confirmed in the AP, lateral, and fluoroscopic planes using fluoroscopy.  The wrist incision was then irrigated and closed using interrupted, inverted  3-0 Vicryl to reapproximate the skin edges and 3-0 nylon placed in simple as  well as centrally in fashion.  At this time the tourniquet was released  after 2 hours 7 minutes and allowed to stay down for 45 minutes while skin  closure was achieved.  The  tourniquet was then reinflated for the carpal  tunnel release.  Beginning at Kaplan's cardinal line on the radial border of  the ring finger, incision was made down to the wrist flexion crease.  Skin  and subcutaneous tissue were sharply divided.  Palmar fascia was encountered  and divided.  Palmaris brevis was identified and divided.  The proximal and  distal aspect of the transverse carpal ligament were visualized.  A self-  retaining Heiss retractor was placed.  The midportion of the ligament was  divided using a 15 blade.  Freer elevator was placed underneath the  ligament, which was then divided out to its proximal and distal margins  under direct visualization with the Therapist, nutritional present to protect the  nerve.  The nerve itself appeared bruised but structurally intact.  At this  time the tourniquet was released, bleeding points encountered were  controlled with some bipolar electrocautery.  The skin was also closed using  3-0 nylon suture.  The patient was then placed in a bulky double sugar tong  splint, and he tolerated the procedure well without immediate complications.  The motor branch of the median nerve was visualized during the carpal tunnel  release.                                               Burnard Bunting, M.D.    GSD/MEDQ  D:  07/08/2002  T:  07/09/2002  Job:  [id number]

## 2011-04-07 NOTE — H&P (Signed)
[place]. [facility]  Patient:    Seth Savage, Seth Savage                      MRN: [id number] Adm. Date:  [id number] Disc. Date: [id number] Attending:  Wende Mott                         History and Physical  CHIEF COMPLAINT:  Painful swollen bilateral wrists with fractures.  HISTORY OF PRESENT ILLNESS:  This is a 60 year old male who sustained injuries to both his wrists and shoulders after a fall while at work.  The patient was initially seen through an emergency room in [facility] and put in sugar-tong casts and referred out for definitive treatment.  Patient was subsequently seen at [facility] and referred to the office.  Patient was seen at the office with bilateral comminuted deformed Colles fractures and with contusion of his left knee and bilateral shoulder strains.  PAST MEDICAL HISTORY:  Left rotator cuff repair in 1992, right rotator cuff repair in 1999, colectomy in February 2000, thyroid disorder.  ALLERGIES:  None known.  MEDICATIONS:  OxyContin, Celebrex and Synthroid.  HABITS:  Smokes one and a half packs per day.  FAMILY HISTORY:  Noncontributory.  REVIEW OF SYSTEMS:  Basically that of history of present illness.  No cardiac or respiratory, no urinary or bowel symptoms.  PHYSICAL EXAMINATION:  VITAL SIGNS:  Temperature 97.2, pulse 84, respirations 16, blood pressure 160/100.  Height 6 feet.  Weight 217.  GENERAL:  Alert and oriented, no acute distress.  HEENT:  Head:  Normocephalic.  Eyes:  Conjunctivae and sclerae clear.  NECK:  Supple.  CHEST:  Clear.  CARDIAC:  S1, S2, regular.  EXTREMITIES:  Bilateral arms in sugar-tong casts.  Good finger movement. Moderate swelling on the fingers.  Bilateral shoulders tender anteriorly, with good range of motion.  Left knee tender anteriorly over the patella.  Range of motion is full, stable.  X-RAY FINDINGS:  X-rays of bilateral wrists reveal comminuted  angulated Colles fractures with severe impaction, intra-articular, bilaterally.  IMPRESSION:  Comminuted bilateral Colles fractures with angulation, intra-articular. DD:  02/11/01 TD:  02/12/01 Job: [id number]

## 2011-04-07 NOTE — Consult Note (Signed)
NAME:  Seth Savage, Seth Savage               ACCOUNT NO.:  [id number]   MEDICAL RECORD NO.:  [id number]           PATIENT TYPE:   LOCATION:                                 FACILITY:   PHYSICIAN:  R. Roetta Sessions, M.D. DATE OF BIRTH:  [birth year]   DATE OF CONSULTATION:  08/12/2004  DATE OF DISCHARGE:                                   CONSULTATION   REASON FOR CONSULTATION:  Possible obstruction of common bile duct.   HISTORY OF PRESENT ILLNESS:  Seth Savage is a 60 year old Caucasian gentleman  who over the last several weeks has had intermittent bouts of right upper-  quadrant discomfort.  On a couple of occasions, he has had severe pain which  has doubled him over.  He has noted these symptoms without any relationship  to meals.  They tend to last 10-15 minutes at a time.  On one occasion, it  occurred while he was driving.  He does continue to have some mild  discomfort in this region, and notes this at times to eating greasy foods.  He has occasional nausea, but no vomiting.  He denies any heartburn  symptoms.  His bowel movements are very regular.  He does occasionally see  red blood mixed in his stools.  He denies any dysuria, hematuria, dysphagia,  odynophagia or weight loss.   He had an abdominal ultrasound on August 02, 2004, which revealed a  normal common bile duct with diameter of 4 mm.  The gallbladder appeared  normal without any stones or wall thickening.  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  There was no significant  intestinal activity seen within the first hour of the study.  This could be  related to a decreased gallbladder ejection fraction.  However, radiologist  felt that additional partial obstruction could not be excluded.  The patient  does not recall having any symptoms related to drinking the half and half.   CURRENT MEDICATIONS:  Synthroid 0.15 mg  daily.   ALLERGIES:  ARTHRITIC MEDICATIONS UPSET HIS STOMACH, ESPECIALLY CELEBREX.   PAST MEDICAL HISTORY:  Hypothyroidism.   SURGICAL HISTORY:  He has had surgery for intussusception five or six years  ago requiring resection of about 14 inches of bowel.  It sounds like this  involved the terminal ileum and proximal colon.  He also has had multiple  surgeries on both arms, wrists and left shoulder due to fractures.  He has  also broken his back due to falling from a second story roof.   FAMILY HISTORY:  Mother has had MI and COPD.  Sister had a stroke.  No  family history of colorectal cancer or chronic GI illnesses.   SOCIAL HISTORY:  He is married.  He has stepchildren, but no biological  children.  He does random construction-type jobs including carpentry.  He  has been smoking since age 24 and smokes 1-1/2 packs of cigarettes daily.  He consumes alcohol approximately once monthly in the way of one or two  drinks.  REVIEW OF SYSTEMS:  Please see HPI for GI.  CARDIOPULMONARY:  Denies any  chest pain or shortness of breath.  Please see HPI for GU.   PHYSICAL EXAMINATION:  VITAL SIGNS:  Weight 217 pounds, height 6 feet,  temperature 97.9, blood pressure 150/82, pulse 76.  GENERAL:  A pleasant, well-nourished, well-developed Caucasian male in no  acute distress.  SKIN:  Warm and dry, no jaundice.  HEENT:  Conjunctivae are pink.  Sclerae are nonicteric.  Oropharyngeal  mucosa moist and pink, no lesions, erythema or exudate.  No lymphadenopathy  or thyromegaly.  CHEST:  Lungs clear to auscultation.  CARDIAC:  Exam revealed regular rate and rhythm, normal S1 and S2, no  murmurs rubs, or gallops.  ABDOMEN:  Positive bowel sounds.  Soft, nondistended, nontender.  No  organomegaly or masses.  EXTREMITIES:  No edema.  RECTAL:  Examination is deferred to time of colonoscopy.   IMPRESSION:  Seth Savage is a 60 year old gentleman with some vague right  upper quadrant abdominal pain at  times related to greasy foods, but often  unrelated to meals.  He has had a couple of bouts of severe right upper  quadrant pain which lasts 10-15 minutes at a time.  Symptoms are not classic  for biliary disease, nor are they classic for peptic ulcer disease or  gastroesophageal reflux disease.  It may be that he does have some biliary  dyskinesia, and it will eventually declare itself.  I suspect lack of  intestinal activity seen after one hour on hepatobiliary scan is  unremarkable.  We will obtain LFTs, however, common bile duct diameter did  appear to be normal on prior ultrasound.  He also reports occasional  intermittent hematochezia.  Would recommend colonoscopy for further  evaluation as well as screening for colorectal cancer.   PLAN:  1.  Colonoscopy in the near future.  2.  Liver function tests.  3.  Protonix 40 mg daily.  Four weeks of samples given.  4.  If he continues to have right upper quadrant abdominal pain, would      consider further evaluation prior to suggesting cholecystectomy.  5.  Further recommendations to follow.   I would like to thank Dr. Regino Schultze for allowing [place] to take part in the care  of his patient.      ________________________________________  Tana Coast, P.A.  ___________________________________________  R. Roetta Sessions, M.D.    LL/MEDQ  D:  08/12/2004  T:  08/12/2004  Job:  [id number]   cc:   Kirk Ruths, M.D.  [address]  [place]  [place] [address]  Fax: [phone]

## 2011-04-07 NOTE — Op Note (Signed)
[place]. [facility]  Patient:    Seth Savage, Seth Savage Visit Number: [id number] MRN: [id number]          Service Type: DSU Location: [facility] 2899 25 Attending Physician:  Wende Mott Dictated by:   Kennieth Rad, M.D. Proc. Date: 07/16/01 Admit Date:  07/16/2001 Discharge Date: 07/16/2001                             Operative Report  PREOPERATIVE DIAGNOSIS:  Osteophyte, right wrist joint.  POSTOPERATIVE DIAGNOSIS:  Osteophyte, right wrist joint.  ANESTHESIA:  General.  PROCEDURE:  Excision of osteophyte, right wrist, ostectomy.  DESCRIPTION OF PROCEDURE:  The patient was taken to the operating room after given adequate preoperative medications, given general anesthesia, and intubated.  Right wrist and forearm was prepped with Duraprep and draped in a sterile manner.  Tourniquet used for hemostasis.  Curved linear incision made over the dorsal aspect of the wrist going through the skin and subcutaneous tissue down to the fascia.  Sharp and blunt dissection was made down to the distal radius, and the osteophyte was identified and resected with an osteotome, smoothed with a bone rasp of the surface.  Copious irrigation was then done, and fascial tissue reapproximated.  Skin was closed with 4-0 nylon. Compressive dressing was applied, and wrist splint applied.  The patient tolerated the entire procedure quite well and sent to the recovery room in stable and satisfactory condition.  DISPOSITION:  The patient is being discharged home on Percocet one q.4 p.r.n. for pain.  Ice packs, elevation, and return to the office in one week.  CONDITION ON DISCHARGE:  Stable and satisfactory condition. Dictated by:   Kennieth Rad, M.D. Attending Physician:  Wende Mott DD:  08/01/01 TD:  08/01/01 Job: [id number]

## 2011-04-07 NOTE — Consult Note (Signed)
NAME:  LEW, PROUT                         ACCOUNT NO.:  [id number]   MEDICAL RECORD NO.:  [id number]                   PATIENT TYPE:  INP   LOCATION:  5002                                 FACILITY:  [facility]   PHYSICIAN:  Burnard Bunting, M.D.                 DATE OF BIRTH:  1951/10/24   DATE OF CONSULTATION:  DATE OF DISCHARGE:  07/03/2002                          ORTHOPEDIC SURGERY CONSULTATION   CHIEF COMPLAINT:  Multi-trauma with multiple fractures.   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  There was  questionable loss of consciousness.  He does report right wrist pain,left  shoulder pain, back pain, and right knee pain.  The patient was injured and  was transferred from [facility].   PAST MEDICAL HISTORY:  Significant for intussusception.   PAST SURGICAL HISTORY:  Notable for bilateral shoulder rotator cuff repairs;  left shoulder occurred about four years ago.  The right shoulder has been  worked on twice within the past decade.  He did fall one year ago from a  height and sustained bilateral distal radius fractures treated with  bilateral external fixation.   CURRENT MEDICATIONS:  Synthroid and Celebrex.   ALLERGIES:  No known drug allergies   REVIEW OF SYSTEMS:  He denies any orthopedic complaints other than wrist  pain, shoulder pain, back pain, and right knee pain.   PHYSICAL EXAMINATION:  GENERAL:  He is alert and oriented x 3 and in mild  distress.  MUSCULOSKELETAL:  C spine has mild tenderness but full range of motion.  The  patient has right wrist swelling with volar abrasion on the ulnar side.  The  compartments are soft.  He has mild global paresthesias in the ulnar and  median distribution.  Radial pulses 2+/4.  Fingers are perfused.  Hand is  also swollen.  He has excellent elbow and shoulder range of motion on the  right-hand side without crepitus.   EPLs are 5+/5 on the right hand, FPLs  5/5.  Interosseus is very weak. STP is 4+/5.  FBP is 4/5 digits 4 and 5.  Right elbow and shoulder range of motion is full again without crepitus or  tenderness.  He has no paresthesias in the C5 to T1 distribution proximal to  the wrist.  Reflexes are 0 to 1+/4 bilateral biceps and triceps. Left upper  extremity demonstrates significant swelling of the shoulder region.  He has  pain but no open abrasions in this region.  He has good left-sided elbow and  wrist range of motion, palpable radial pulse, intact sensation in the  median, radial, and ulnar distribution with excellent __________  interosseus/__________  biceps and triceps function.  No paresthesias in the  deltoid distribution.  No lymphadenopathy.  No abrasions noted on the skin  on the left-hand  side  The patient does have tenderness to direct palpation  of the lumbosacral spine without bruising.   On lower extremity examination, he has bilateral pedal pulses, intact  sensation L1 to S1 bilaterally.  Negative clonus, negative Babinski.  No hip  or groin pain with internal or external range of motion.  He does have a  right knee effusion which is mild to moderate.  ANTERIOR CRUCIATE LIGAMENT  laxity is noted on that side.  PCL, MCL, and LCL are intact.  There is no  focal joint line tenderness. Extension mechanism is intact bilaterally.  He  has 5/5 dorsiflexion, plantar flexion, quad and hamstring strength.  He has  no nerve root tension signs.  He has no saddle paresthesias.  The left knee  has no effusion. The bilateral ankles have full range of motion with good  tendon function.  No crepitus or swelling is noted in the ankle region.  No  paresthesia on dorsal or plantar aspect of the foot.  The patient has  abrasions bilateral legs on the anterior sides.  ADENOPATHY:  There is no supraclavicular or cervical adenopathy.   LABORATORY DATA:  X-rays are reviewed.  He has a distal radius  fracture on  the right which is intra-articular and dorsally displaced.  He also has an  ulnar shaft fracture with mild angulation.  This was approximately 5 to 6 cm  proximal to the joint line.   Radiographs of the left shoulder show a two to three-part fracture with  located head on the left-hand side.  He also has transverse process  fractures on the right side at L1-2.  He had vertebral body segments L1 an  L2.   Head, neck, and abdominal CTs are negative.   Chest x-ray is negative.   Laboratory values include hematocrit 47.  Sodium 136, potassium 3.8, BUN 22,  creatinine 1.3.  Platelets 263.  PT 12.8, PTT 21.   IMPRESSION:  Right distal radius and ulnar shaft fracture with possible  carpal tunnel syndrome.  The patient has abrasions on the wrists, but none  of  these fractures are enclosed.  The patient may possibly also have an  ulnar neuropathy from the injury.  The patient also needs open reduction,  internal fixation of the left shoulder fracture because he has a multi-  trauma.  I want to get a CT scan through the wrist and shoulder for  preoperative planning.  I also want to get an MRI scan on right knee to  evaluate his anterior cruciate ligament injury.  Plain x-rays of the right  knee area unremarkable.   The risks and benefits of fracture fixation on the wrist and shoulder  include but are not limited to bleeding, infection, nonunion, malunion,  nerve or vessel damage, as well as need for more surgery and possible  amputation.  The patient understands, and we will proceed with open  reduction, internal fixation of the distal radius tomorrow.  I would like to  elevate the hand tonight to decrease swelling.  I want to hold off on the  shoulder until a little bit more swelling comes out of the shoulder joint.  The patient understands. We also discussed this with his wife.                                               G.  Dorene Grebe, M.D.   GSD/MEDQ  D:  07/02/2002   T:  07/06/2002  Job:  [id number]

## 2011-04-07 NOTE — Op Note (Signed)
NAME:  Seth Savage, Seth Savage                           ACCOUNT NO.:  [id number]   MEDICAL RECORD NO.:  [id number]                   PATIENT TYPE:   LOCATION:                                       FACILITY:   PHYSICIAN:  Burnard Bunting, M.D.                 DATE OF BIRTH:   DATE OF PROCEDURE:  DATE OF DISCHARGE:                                 OPERATIVE REPORT   PREOPERATIVE DIAGNOSIS:  Left shoulder comminuted proximal humerus fracture.   POSTOPERATIVE DIAGNOSIS:  Left shoulder comminuted proximal humerus  fracture.   PROCEDURE:  Open reduction and internal fixation of left shoulder proximal  comminuted humerus fracture.   SURGEON:  Burnard Bunting, M.D.   ASSISTANT:  Colon Flattery. Ollen Bowl, M.D.   ANESTHESIA:  General endotracheal anesthesia.   ESTIMATED BLOOD LOSS:  150 cc.   DRAINS:  None.   DESCRIPTION OF PROCEDURE:  The patient was brought to the operating room,  where general endotracheal anesthesia was induced.  Preoperative IV  antibiotics were administered.  The left shoulder was prepped with  Duraprep  solution and draped in a sterile manner.  The operative field was covered  with Ioban.  A standard deltopectoral approach to the shoulder was utilized.  Prior incision was noted, which crossed transversely across the operative  field.  Skin and subcutaneous tissue were sharply divided.  The cephalic  vein was identified distally within the incision and then was moved proximal  and then was dissected proximally to identify the interval between the  deltoid and pectoralis.  The vein was mobilized laterally with the deltoid.  A self-retaining retractor was placed.  Digital mobilization of the deltoid  off of the rotator cuff was made.  The conjoined tendon was also mobilized.  The fracture site was palpated.  The midshaft had displaced anteriorly in  relation to the head.  The head was intact with the exception of a cortical  piece on the posterior aspect.  This piece measured  approximately 1 x 1.5  cm.  The axillary nerve was palpated anteriorly and posteriorly, anterior to  the humeral neck and posterior to the humeral neck, at this time.  The  periosteal elevator was used to elevate periosteum from around the fracture  site to enable cortical reads to be made for reduction.  The fracture was  then reduced.  The lateral locking plate was then fixed to the lateral  aspect of the humerus with the fracture reduced, and a good reduction was  confirmed in the AP and the axillary planes.  A cortical screw was placed  within the plate and several locking screws were placed into the head.  Again, fracture reduction was visualized and found to be intact.  The head  was then filled with locking screws, the correct length of which were  measured and confirmed under fluoroscopic guidance.  The plate was then  locked to  the humeral shaft using three cortical locking screws.  The arm  had excellent range of motion following fracture fixation.  Axillary nerve  remained intact following fracture fixation.  Screw lengths were confirmed  again in the AP and lateral planes and found to be nonpenetrating within the  glenohumeral joint.  At this time the incision was thoroughly irrigated.  The deltopectoral interval was closed using a 0 Vicryl suture.  The  integrity of the cephalic  vein was maintained.  The skin and subcutaneous tissue were then  reapproximated using interrupted, inverted 3-0 Vicryl suture, followed by  skin staples.  Impervious dressing and shoulder sling were placed.  The  patient had a palpable radial pulse and squeezed my hand prior to leaving  the OR.                                               Burnard Bunting, M.D.    GSD/MEDQ  D:  07/08/2002  T:  07/09/2002  Job:  [id number]

## 2011-04-07 NOTE — H&P (Signed)
[place]  Patient:    Seth Savage, Seth Savage Visit Number: [id number] MRN: [id number]          Service Type: DSU Location: [facility] 2899 25 Attending Physician:  Wende Mott Dictated by:   Kennieth Rad, M.D. Admit Date:  07/16/2001 Discharge Date: 07/16/2001                           History and Physical  CHIEF COMPLAINT:  Painful blockage right wrist.  HISTORY OF PRESENT ILLNESS:  The patient is a 60 year old who has been treated for comminuted bilateral wrist fractures.  The patient has done well with the right one but has progressively lost more motion in the right wrist with onset of pain dorsally.  The patient had external fixation which was removed approximately six weeks ago.  The patient had a loose fragment dorsally over the intercarpal joints.  X-rays revealed that the fragment has attached to the distal radius blocking dorsal flexion of the wrist.  PAST MEDICAL HISTORY: 1. Left rotator cuff repair in 1992. 2. Right rotator cuff repair in 1999. 3. Colectomy in 2000. 4. Closed reduction and external fixation bilateral wrists, 2002. 5. Thyroid disorder.  ALLERGIES:  None.  ADMISSION MEDICATIONS: 1. Synthroid. 2. Celebrex.  HABITS:  Smokes 1-1/2 pack per day for the past 30 years.  FAMILY HISTORY:  Noncontributory.  REVIEW OF SYSTEMS:  Basically as in history of the present illness. Otherwise, there is some shoulder discomfort occasionally.  PHYSICAL EXAMINATION:  VITAL SIGNS:  Temperature is 96.9, pulse 70, respirations 16, and blood pressure 144/81.  Height 6 feet.  Weight 217 pounds.  HEENT:  Normocephalic.  Eyes:  Conjunctivae are clear.  NECK:  Supple.  CHEST:  Clear.  CARDIOVASCULAR:  S1 and S2 regular.  EXTREMITIES:  Right wrist with decreased dorsiflexion.  The patient is able to dorsiflex only to a neutral position.  Tender dorsally with palpable nodule over intercarpal joint.  X-ray revealed  osteophyte where the free fragment had attached to the distal radius.  IMPRESSION:  Osteophyte bone fragment, right wrist joint. Dictated by:   Kennieth Rad, M.D. Attending Physician:  Wende Mott DD:  08/01/01 TD:  08/01/01 Job: [id number]

## 2011-08-15 LAB — URINALYSIS, ROUTINE W REFLEX MICROSCOPIC
Bilirubin Urine: NEGATIVE
Glucose, UA: NEGATIVE
Hgb urine dipstick: NEGATIVE
Ketones, ur: NEGATIVE
Protein, ur: NEGATIVE
Urobilinogen, UA: 1

## 2011-08-15 LAB — CBC
HCT: 47.3
Hemoglobin: 12.7 — ABNORMAL LOW
Hemoglobin: 15.9
MCHC: 33.9
MCV: 91.6
RBC: 4.09 — ABNORMAL LOW
RBC: 5.22
RDW: 13.6
WBC: 12.3 — ABNORMAL HIGH

## 2011-08-15 LAB — BASIC METABOLIC PANEL
CO2: 25
Chloride: 103
Creatinine, Ser: 0.99
GFR calc Af Amer: >60
Sodium: 135

## 2011-08-15 LAB — COMPREHENSIVE METABOLIC PANEL
ALT: 29
Alkaline Phosphatase: 99
BUN: 18
CO2: 26
Chloride: 102
GFR calc non Af Amer: >60
Glucose, Bld: 91
Potassium: 4.7
Sodium: 137
Total Bilirubin: 0.5
Total Protein: 6.6

## 2011-08-15 LAB — ABO/RH: ABO/RH(D): A POS

## 2011-08-15 LAB — PROTIME-INR
INR: 0.9
Prothrombin Time: 12.3

## 2012-09-16 ENCOUNTER — Encounter: Payer: Self-pay | Admitting: Internal Medicine

## 2012-09-17 ENCOUNTER — Other Ambulatory Visit: Payer: Self-pay

## 2012-09-17 ENCOUNTER — Encounter: Payer: Self-pay | Admitting: Gastroenterology

## 2012-09-17 ENCOUNTER — Ambulatory Visit (INDEPENDENT_AMBULATORY_CARE_PROVIDER_SITE_OTHER): Payer: Medicare Other | Admitting: Gastroenterology

## 2012-09-17 VITALS — BP 154/83 | HR 72 | Temp 98.4°F | Ht 69.0 in | Wt 219.0 lb

## 2012-09-17 DIAGNOSIS — K921 Melena: Secondary | ICD-10-CM

## 2012-09-17 DIAGNOSIS — R1012 Left upper quadrant pain: Secondary | ICD-10-CM

## 2012-09-17 DIAGNOSIS — K625 Hemorrhage of anus and rectum: Secondary | ICD-10-CM

## 2012-09-17 MED ORDER — PEG-KCL-NACL-NASULF-NA ASC-C 100 G PO SOLR: 1.0000 | ORAL | Status: DC

## 2012-09-17 NOTE — Patient Instructions (Addendum)
We have set you up for a colonoscopy with Dr. Jena Gauss.  If you notice any recurrence of the left-sided discomfort, please call our office.   Follow a low-fat diet. Avoid greasy, fatty foods.

## 2012-09-17 NOTE — Progress Notes (Signed)
Referring Provider: Karleen Hampshire, MD Primary Care Physician:  Kirk Ruths, MD Primary Gastroenterologist:  Dr. Jena Gauss   Chief Complaint  Patient presents with  . Rectal Bleeding  . Abdominal Pain    HPI:   62 year old male presenting today at the request of Dr. Regino Schultze secondary to abdominal pain and rectal bleeding. Last TCS in 2005 by Dr. Jena Gauss with normal rectum and colon, small polyp at 25 cm, path unknown at time of this visit. Notes a few incidences of LUQ pain over the past month, intermittent in nature, lasting 3-4 days each episode. None in past 4 days. No association with eating. Denies nausea or decrease appetite. Notes bloating with these episodes of LUQ discomfort. Denies reflux or dysphagia. No wt loss. Also reports looser stools than normal. Eats corn causing looser stools. Large volume hematochezia "like a bottle of ketchup" recently. Recently prescribed Naprosyn by PCP.   Past Medical History  Diagnosis Date  . Hypertension   . Hypercholesterolemia   . Goiter     now on thyroid medication, s/p radioactive iodine treatment    Past Surgical History  Procedure Date  . Colonoscopy 08/24/2004     Normal rectum,Small polyp at 25 cm, cold snared/ The remainder of the colonic mucosa appeared normal  . Rotator cuff repair 1992    left, metal implant   . Rotator cuff repair 1999    right  . Colectomy 2000    secondary to intussuception  . Radioactive iodine for thyroid   . Back surgery 2003  . Shoulder surgery   . Multiple bilateral arm/wrist surgeries after falls     Current Outpatient Prescriptions  Medication Sig Dispense Refill  . atorvastatin (LIPITOR) 10 MG tablet Take 10 mg by mouth daily.       Marland Kitchen losartan (COZAAR) 100 MG tablet Take 100 mg by mouth daily.       . naproxen (NAPROSYN) 500 MG tablet Take 500 mg by mouth 2 (two) times daily with a meal.       . SYNTHROID 150 MCG tablet Take 150 mcg by mouth daily.       . peg 3350 powder (MOVIPREP)  100 G SOLR Take 1 kit (100 g total) by mouth as directed.  1 kit  0    Allergies as of 09/17/2012 - Review Complete 09/17/2012  Allergen Reaction Noted  . Lyrica (pregabalin)  09/17/2012    Family History  Problem Relation Age of Onset  . Colon cancer Neg Hx     History   Social History  . Marital Status: Married    Spouse Name: N/A    Number of Children: N/A  . Years of Education: N/A   Occupational History  . Not on file.   Social History Main Topics  . Smoking status: Current Some Day Smoker -- 0.2 packs/day    Types: Cigarettes  . Smokeless tobacco: Not on file  . Alcohol Use: Yes     Comment: 6 pack in a month  . Drug Use: No  . Sexually Active: Not on file   Other Topics Concern  . Not on file   Social History Narrative  . No narrative on file    Review of Systems: Gen: Denies any fever, chills, loss of appetite, fatigue, weight loss. CV: Denies chest pain, heart palpitations, syncope, peripheral edema. Resp: Denies shortness of breath with rest, cough, wheezing GI: Denies dysphagia or odynophagia. Denies hematemesis, fecal incontinence, or jaundice.  GU : Denies urinary burning, urinary  frequency, urinary incontinence.  MS: Denies joint pain, muscle weakness, cramps, limited movement Derm: Denies rash, itching, dry skin Psych: Denies depression, anxiety, confusion or memory loss  Heme: Denies bruising, bleeding, and enlarged lymph nodes.  Physical Exam: BP 154/83  Pulse 72  Temp 98.4 F (36.9 C) (Temporal)  Ht 5\' 9"  (1.753 m)  Wt 219 lb (99.338 kg)  BMI 32.34 kg/m2 General:   Alert and oriented. Well-developed, well-nourished, pleasant and cooperative. Head:  Normocephalic and atraumatic. Eyes:  Conjunctiva pink, sclera clear, no icterus.   Conjunctiva pink. Ears:  Normal auditory acuity. Nose:  No deformity, discharge,  or lesions. Mouth:  No deformity or lesions, mucosa pink and moist.  Neck:  Supple, without mass or thyromegaly. Lungs:   Clear to auscultation bilaterally, without wheezing, rales, or rhonchi.  Heart:  S1, S2 present without murmurs noted.  Abdomen:  +BS, soft, non-tender and non-distended. Without mass or HSM. No rebound or guarding. No hernias noted. Rectal:  Deferred  Msk:  Symmetrical without gross deformities. Normal posture. Extremities:  Without clubbing or edema. Neurologic:  Alert and  oriented x4;  grossly normal neurologically. Skin:  Intact, warm and dry without significant lesions or rashes Cervical Nodes:  No significant cervical adenopathy. Psych:  Alert and cooperative. Normal mood and affect.

## 2012-09-18 ENCOUNTER — Encounter (HOSPITAL_COMMUNITY): Payer: Self-pay | Admitting: Pharmacy Technician

## 2012-09-22 ENCOUNTER — Encounter: Payer: Self-pay | Admitting: Gastroenterology

## 2012-09-23 ENCOUNTER — Encounter: Payer: Self-pay | Admitting: Gastroenterology

## 2012-09-23 DIAGNOSIS — K921 Melena: Secondary | ICD-10-CM | POA: Insufficient documentation

## 2012-09-23 DIAGNOSIS — R1012 Left upper quadrant pain: Secondary | ICD-10-CM | POA: Insufficient documentation

## 2012-09-23 NOTE — Assessment & Plan Note (Signed)
Only a few episodes, pt does not want to pursue evaluation. Instructed on avoidance of fatty, greasy foods. Limit naprosyn. May need upper GI evaluation if continues.

## 2012-09-23 NOTE — Progress Notes (Signed)
Faxed to PCP

## 2012-09-23 NOTE — Assessment & Plan Note (Signed)
61 year old male with incidences of large volume hematochezia, last TCS in 2005 by Dr. Jena Gauss overall normal with small polyp, path unknown at time of visit. No FH of colon cancer. Recently prescribed Naprosyn by PCP, but this was after incidence of hematochezia. Doubt dealing with NSAID-related injury, as Naprosyn prescribed after incidents. Query benign anorectal source, but due to amount of time since last TCS, will proceed with re-evaluation of lower GI tract.   Proceed with TCS with Dr. Jena Gauss in near future: the risks, benefits, and alternatives have been discussed with the patient in detail. The patient states understanding and desires to proceed. Seek medical attention if worsening.

## 2012-09-24 ENCOUNTER — Encounter (HOSPITAL_COMMUNITY): Payer: Self-pay | Admitting: *Deleted

## 2012-09-24 ENCOUNTER — Ambulatory Visit (HOSPITAL_COMMUNITY)
Admission: RE | Admit: 2012-09-24 | Discharge: 2012-09-24 | Disposition: A | Discharge status: Home / Self Care | Payer: Medicare Other | Source: Ambulatory Visit | Attending: Internal Medicine | Admitting: Internal Medicine

## 2012-09-24 ENCOUNTER — Encounter (HOSPITAL_COMMUNITY)
Admission: RE | Disposition: A | Discharge status: Home / Self Care | Payer: Self-pay | Source: Ambulatory Visit | Attending: Internal Medicine

## 2012-09-24 DIAGNOSIS — K648 Other hemorrhoids: Secondary | ICD-10-CM | POA: Insufficient documentation

## 2012-09-24 DIAGNOSIS — K5730 Diverticulosis of large intestine without perforation or abscess without bleeding: Secondary | ICD-10-CM

## 2012-09-24 DIAGNOSIS — K625 Hemorrhage of anus and rectum: Secondary | ICD-10-CM

## 2012-09-24 DIAGNOSIS — K921 Melena: Secondary | ICD-10-CM

## 2012-09-24 DIAGNOSIS — I10 Essential (primary) hypertension: Secondary | ICD-10-CM | POA: Insufficient documentation

## 2012-09-24 HISTORY — PX: COLONOSCOPY: SHX5424

## 2012-09-24 SURGERY — COLONOSCOPY
Anesthesia: Moderate Sedation

## 2012-09-24 MED ORDER — MEPERIDINE HCL 100 MG/ML IJ SOLN
INTRAMUSCULAR | Status: AC
  Filled 2012-09-24: qty 1

## 2012-09-24 MED ORDER — SODIUM CHLORIDE 0.45 % IV SOLN
INTRAVENOUS | Status: DC
  Administered 2012-09-24: 1000 mL via INTRAVENOUS

## 2012-09-24 MED ORDER — MIDAZOLAM HCL 5 MG/5ML IJ SOLN
INTRAMUSCULAR | Status: AC
  Filled 2012-09-24: qty 10

## 2012-09-24 MED ORDER — MEPERIDINE HCL 100 MG/ML IJ SOLN
INTRAMUSCULAR | Status: DC | PRN
  Administered 2012-09-24: 25 mg via INTRAVENOUS
  Administered 2012-09-24: 50 mg via INTRAVENOUS

## 2012-09-24 MED ORDER — MIDAZOLAM HCL 5 MG/5ML IJ SOLN
INTRAMUSCULAR | Status: DC | PRN
  Administered 2012-09-24 (×2): 2 mg via INTRAVENOUS

## 2012-09-24 MED ORDER — SIMETHICONE 40 MG/0.6ML PO SUSP
ORAL | Status: DC | PRN
  Administered 2012-09-24: 13:00:00

## 2012-09-24 NOTE — H&P (View-Only) (Signed)
 Referring Provider: McGough, William, MD Primary Care Physician:  MCGOUGH,WILLIAM M, MD Primary Gastroenterologist:  Dr. Rourk   Chief Complaint  Patient presents with  . Rectal Bleeding  . Abdominal Pain    HPI:   61-year-old male presenting today at the request of Dr. McGough secondary to abdominal pain and rectal bleeding. Last TCS in 2005 by Dr. Rourk with normal rectum and colon, small polyp at 25 cm, path unknown at time of this visit. Notes a few incidences of LUQ pain over the past month, intermittent in nature, lasting 3-4 days each episode. None in past 4 days. No association with eating. Denies nausea or decrease appetite. Notes bloating with these episodes of LUQ discomfort. Denies reflux or dysphagia. No wt loss. Also reports looser stools than normal. Eats corn causing looser stools. Large volume hematochezia "like a bottle of ketchup" recently. Recently prescribed Naprosyn by PCP.   Past Medical History  Diagnosis Date  . Hypertension   . Hypercholesterolemia   . Goiter     now on thyroid medication, s/p radioactive iodine treatment    Past Surgical History  Procedure Date  . Colonoscopy 08/24/2004     Normal rectum,Small polyp at 25 cm, cold snared/ The remainder of the colonic mucosa appeared normal  . Rotator cuff repair 1992    left, metal implant   . Rotator cuff repair 1999    right  . Colectomy 2000    secondary to intussuception  . Radioactive iodine for thyroid   . Back surgery 2003  . Shoulder surgery   . Multiple bilateral arm/wrist surgeries after falls     Current Outpatient Prescriptions  Medication Sig Dispense Refill  . atorvastatin (LIPITOR) 10 MG tablet Take 10 mg by mouth daily.       . losartan (COZAAR) 100 MG tablet Take 100 mg by mouth daily.       . naproxen (NAPROSYN) 500 MG tablet Take 500 mg by mouth 2 (two) times daily with a meal.       . SYNTHROID 150 MCG tablet Take 150 mcg by mouth daily.       . peg 3350 powder (MOVIPREP)  100 G SOLR Take 1 kit (100 g total) by mouth as directed.  1 kit  0    Allergies as of 09/17/2012 - Review Complete 09/17/2012  Allergen Reaction Noted  . Lyrica (pregabalin)  09/17/2012    Family History  Problem Relation Age of Onset  . Colon cancer Neg Hx     History   Social History  . Marital Status: Married    Spouse Name: N/A    Number of Children: N/A  . Years of Education: N/A   Occupational History  . Not on file.   Social History Main Topics  . Smoking status: Current Some Day Smoker -- 0.2 packs/day    Types: Cigarettes  . Smokeless tobacco: Not on file  . Alcohol Use: Yes     Comment: 6 pack in a month  . Drug Use: No  . Sexually Active: Not on file   Other Topics Concern  . Not on file   Social History Narrative  . No narrative on file    Review of Systems: Gen: Denies any fever, chills, loss of appetite, fatigue, weight loss. CV: Denies chest pain, heart palpitations, syncope, peripheral edema. Resp: Denies shortness of breath with rest, cough, wheezing GI: Denies dysphagia or odynophagia. Denies hematemesis, fecal incontinence, or jaundice.  GU : Denies urinary burning, urinary   frequency, urinary incontinence.  MS: Denies joint pain, muscle weakness, cramps, limited movement Derm: Denies rash, itching, dry skin Psych: Denies depression, anxiety, confusion or memory loss  Heme: Denies bruising, bleeding, and enlarged lymph nodes.  Physical Exam: BP 154/83  Pulse 72  Temp 98.4 F (36.9 C) (Temporal)  Ht 5' 9" (1.753 m)  Wt 219 lb (99.338 kg)  BMI 32.34 kg/m2 General:   Alert and oriented. Well-developed, well-nourished, pleasant and cooperative. Head:  Normocephalic and atraumatic. Eyes:  Conjunctiva pink, sclera clear, no icterus.   Conjunctiva pink. Ears:  Normal auditory acuity. Nose:  No deformity, discharge,  or lesions. Mouth:  No deformity or lesions, mucosa pink and moist.  Neck:  Supple, without mass or thyromegaly. Lungs:   Clear to auscultation bilaterally, without wheezing, rales, or rhonchi.  Heart:  S1, S2 present without murmurs noted.  Abdomen:  +BS, soft, non-tender and non-distended. Without mass or HSM. No rebound or guarding. No hernias noted. Rectal:  Deferred  Msk:  Symmetrical without gross deformities. Normal posture. Extremities:  Without clubbing or edema. Neurologic:  Alert and  oriented x4;  grossly normal neurologically. Skin:  Intact, warm and dry without significant lesions or rashes Cervical Nodes:  No significant cervical adenopathy. Psych:  Alert and cooperative. Normal mood and affect.   

## 2012-09-24 NOTE — Op Note (Signed)
[facility] [address] [place], [address]   COLONOSCOPY PROCEDURE REPORT  PATIENT: Seth Savage, Seth Savage  MR#:         [id number] BIRTHDATE: 05/09/1951 , 61  yrs. old GENDER: Male ENDOSCOPIST: R.  Roetta Sessions, MD FACP FACG REFERRED BY:  Karleen Hampshire, M.D. PROCEDURE DATE:  09/24/2012 PROCEDURE:   diagnostic colonoscopy  INDICATIONS: Self-limited bout of hematochezia  INFORMED CONSENT:  The risks, benefits, alternatives and imponderables including but not limited to bleeding, perforation as well as the possibility of a missed lesion have been reviewed.  The potential for biopsy, lesion removal, etc. have also been discussed.  Questions have been answered.  All parties agreeable. Please see the history and physical in the medical record for more information.  MEDICATIONS: Versed 4 mg IV and Demerol 75 mg IV in divided doses.  DESCRIPTION OF PROCEDURE:  After a digital rectal exam was performed, the EC-[id number] ([id number])  colonoscope was advanced from the anus through the rectum and colon to the area of the cecum, ileocecal valve and appendiceal orifice.  The cecum was deeply intubated.  These structures were well-seen and photographed for the record.  From the level of the cecum and ileocecal valve, the scope was slowly and cautiously withdrawn.  The mucosal surfaces were carefully surveyed utilizing scope tip deflection to facilitate fold flattening as needed.  The scope was pulled down into the rectum where a thorough examination including retroflexion was performed.    FINDINGS:  Adequate preparation. Minimal internal hemorrhoids; otherwise, normal appearing colonic mucosa. Few scattered pancolonic diverticula; the remainder of the colonic mucosa appeared normal.  THERAPEUTIC / DIAGNOSTIC MANEUVERS PERFORMED:  None  COMPLICATIONS: none  CECAL WITHDRAWAL TIME:  8 minutes  IMPRESSION:  Internal hemorrhoids-likely source of hematochezia. Colonic  diverticulosis.  RECOMMENDATIONS: Hemorrhoid information provided. Consider daily fiber supplement such as Benefiber 2 tablespoons   _______________________________ eSigned:  R. Roetta Sessions, MD FACP [facility] 09/24/2012 1:36 PM   CC:

## 2012-09-24 NOTE — Interval H&P Note (Signed)
History and Physical Interval Note:  09/24/2012 1:02 PM  Seth Savage  has presented today for surgery, with the diagnosis of Rectal Bleed  The various methods of treatment have been discussed with the patient and family. After consideration of risks, benefits and other options for treatment, the patient has consented to  Procedure(s) (LRB) with comments: COLONOSCOPY (N/A) - 2:00 PM as a surgical intervention .  The patient's history has been reviewed, patient examined, no change in status, stable for surgery.  I have reviewed the patient's chart and labs.  Questions were answered to the patient's satisfaction.     Seth Savage  No further abdominal pain and no further hematochezia. Patient describes regular bowel movements daily. Colonoscopy now being performed per plan.The risks, benefits, limitations, alternatives and imponderables have been reviewed with the patient. Questions have been answered. All parties are agreeable.

## 2012-09-27 ENCOUNTER — Encounter (HOSPITAL_COMMUNITY): Payer: Self-pay | Admitting: Internal Medicine

## 2012-11-04 ENCOUNTER — Encounter: Payer: Self-pay | Admitting: Vascular Surgery

## 2012-11-29 ENCOUNTER — Other Ambulatory Visit: Payer: Self-pay | Admitting: *Deleted

## 2012-11-29 DIAGNOSIS — I6529 Occlusion and stenosis of unspecified carotid artery: Secondary | ICD-10-CM

## 2012-11-29 DIAGNOSIS — Z48812 Encounter for surgical aftercare following surgery on the circulatory system: Secondary | ICD-10-CM

## 2012-12-02 ENCOUNTER — Ambulatory Visit (HOSPITAL_COMMUNITY)
Admission: RE | Admit: 2012-12-02 | Discharge: 2012-12-02 | Disposition: A | Discharge status: Home / Self Care | Payer: Medicare Other | Source: Ambulatory Visit | Attending: Family Medicine | Admitting: Family Medicine

## 2012-12-02 ENCOUNTER — Other Ambulatory Visit (HOSPITAL_COMMUNITY): Payer: Self-pay | Admitting: Family Medicine

## 2012-12-02 DIAGNOSIS — J209 Acute bronchitis, unspecified: Secondary | ICD-10-CM

## 2012-12-02 DIAGNOSIS — R05 Cough: Secondary | ICD-10-CM | POA: Insufficient documentation

## 2012-12-02 DIAGNOSIS — R059 Cough, unspecified: Secondary | ICD-10-CM | POA: Insufficient documentation

## 2012-12-02 DIAGNOSIS — J069 Acute upper respiratory infection, unspecified: Secondary | ICD-10-CM

## 2012-12-02 DIAGNOSIS — R509 Fever, unspecified: Secondary | ICD-10-CM | POA: Insufficient documentation

## 2012-12-11 ENCOUNTER — Encounter: Payer: Self-pay | Admitting: Neurosurgery

## 2012-12-12 ENCOUNTER — Other Ambulatory Visit (INDEPENDENT_AMBULATORY_CARE_PROVIDER_SITE_OTHER): Payer: Medicare Other | Admitting: *Deleted

## 2012-12-12 ENCOUNTER — Encounter: Payer: Self-pay | Admitting: Neurosurgery

## 2012-12-12 ENCOUNTER — Ambulatory Visit (INDEPENDENT_AMBULATORY_CARE_PROVIDER_SITE_OTHER): Payer: Medicare Other | Admitting: Neurosurgery

## 2012-12-12 VITALS — BP 140/89 | HR 70 | Resp 16 | Ht 72.0 in | Wt 212.0 lb

## 2012-12-12 DIAGNOSIS — I6529 Occlusion and stenosis of unspecified carotid artery: Secondary | ICD-10-CM

## 2012-12-12 DIAGNOSIS — Z48812 Encounter for surgical aftercare following surgery on the circulatory system: Secondary | ICD-10-CM

## 2012-12-12 NOTE — Progress Notes (Signed)
[facility] Carotid Office Note  CC: Carotid surveillance Referring Physician: Early  History of Present Illness: 62 year old male patient of Dr. Arbie Cookey status post left CEA in April 2009. The patient denies any signs or symptoms of CVA, TIA, amaurosis fugax or any neural deficit. The patient denies any new medical diagnoses or recent surgery.  Past Medical History  Diagnosis Date  . Hypertension   . Hypercholesterolemia   . Goiter     now on thyroid medication, s/p radioactive iodine treatment  . Carotid artery occlusion   . COPD (chronic obstructive pulmonary disease)     ROS: [x]  Positive   [ ]  Denies    General: [ ]  Weight loss, [ ]  Fever, [ ]  chills Neurologic: [ ]  Dizziness, [ ]  Blackouts, [ ]  Seizure [ ]  Stroke, [ ]  "Mini stroke", [ ]  Slurred speech, [ ]  Temporary blindness; [ ]  weakness in arms or legs, [ ]  Hoarseness Cardiac: [ ]  Chest pain/pressure, [ ]  Shortness of breath at rest [ ]  Shortness of breath with exertion, [ ]  Atrial fibrillation or irregular heartbeat Vascular: [ ]  Pain in legs with walking, [ ]  Pain in legs at rest, [ ]  Pain in legs at night,  [ ]  Non-healing ulcer, [ ]  Blood clot in vein/DVT,   Pulmonary: [ ]  Home oxygen, [ ]  Productive cough, [ ]  Coughing up blood, [ ]  Asthma,  [ ]  Wheezing Musculoskeletal:  [ ]  Arthritis, [ ]  Low back pain, [ ]  Joint pain Hematologic: [ ]  Easy Bruising, [ ]  Anemia; [ ]  Hepatitis Gastrointestinal: [ ]  Blood in stool, [ ]  Gastroesophageal Reflux/heartburn, [ ]  Trouble swallowing Urinary: [ ]  chronic Kidney disease, [ ]  on HD - [ ]  MWF or [ ]  TTHS, [ ]  Burning with urination, [ ]  Difficulty urinating Skin: [ ]  Rashes, [ ]  Wounds Psychological: [ ]  Anxiety, [ ]  Depression   Social History History  Substance Use Topics  . Smoking status: Current Some Day Smoker -- 0.2 packs/day    Types: Cigarettes  . Smokeless tobacco: Never Used  . Alcohol Use: Yes     Comment: 6 pack in a month     Family History Family History  Problem Relation Age of Onset  . Colon cancer Neg Hx   . COPD Mother   . Mental illness Mother   . Hypertension Father   . Alcohol abuse Father     Allergies  Allergen Reactions  . Lyrica (Pregabalin) Shortness Of Breath    Current Outpatient Prescriptions  Medication Sig Dispense Refill  . atorvastatin (LIPITOR) 10 MG tablet Take 10 mg by mouth daily.       Marland Kitchen losartan (COZAAR) 100 MG tablet Take 100 mg by mouth daily.       . naproxen (NAPROSYN) 500 MG tablet Take 500 mg by mouth 2 (two) times daily with a meal.       . peg 3350 powder (MOVIPREP) 100 G SOLR Take 1 kit (100 g total) by mouth as directed.  1 kit  0  . SYNTHROID 150 MCG tablet Take 150 mcg by mouth daily.         Physical Examination  Filed Vitals:   12/12/12 1103  BP: 140/89  Pulse: 70  Resp:     Body mass index is 28.75 kg/(m^2).  General:  WDWN in NAD Gait: Normal HEENT: WNL Eyes: Pupils equal Pulmonary: normal non-labored breathing , without Rales, rhonchi,  wheezing Cardiac: RRR, without  Murmurs, rubs  or gallops; Abdomen: soft, NT, no masses Skin: no rashes, ulcers noted  Vascular Exam Pulses: 3+ radial pulses bilaterally Carotid bruits : Carotid pulses to auscultation no bruits are heard Extremities without ischemic changes, no Gangrene , no cellulitis; no open wounds;  Musculoskeletal: no muscle wasting or atrophy   Neurologic: A&O X 3; Appropriate Affect ; SENSATION: normal; MOTOR FUNCTION:  moving all extremities equally. Speech is fluent/normal  Non-Invasive Vascular Imaging CAROTID DUPLEX 12/12/2012  Right ICA 40-59% stenosis Left ICA 0 - 19% stenosis   ASSESSMENT/PLAN: Asymptomatic patient with a slight increase in right ICA stenosis from previous exam. The patient will followup in one year with repeat carotid duplex. The patient's questions were encouraged and answered, he is in agreement with this plan.  Lauree Chandler ANP   Clinic MD:  Edilia Bo on call

## 2012-12-13 NOTE — Addendum Note (Signed)
Addended by: Lorin Mercy K on: 12/13/2012 12:12 PM   Modules accepted: Orders

## 2013-05-19 ENCOUNTER — Encounter (HOSPITAL_COMMUNITY): Payer: Self-pay | Admitting: *Deleted

## 2013-05-19 ENCOUNTER — Emergency Department (HOSPITAL_COMMUNITY)
Admission: EM | Admit: 2013-05-19 | Discharge: 2013-05-20 | Disposition: A | Discharge status: Home / Self Care | Payer: Medicare Other | Attending: Emergency Medicine | Admitting: Emergency Medicine

## 2013-05-19 ENCOUNTER — Emergency Department (HOSPITAL_COMMUNITY): Payer: Medicare Other

## 2013-05-19 DIAGNOSIS — Z8679 Personal history of other diseases of the circulatory system: Secondary | ICD-10-CM | POA: Insufficient documentation

## 2013-05-19 DIAGNOSIS — R6883 Chills (without fever): Secondary | ICD-10-CM | POA: Insufficient documentation

## 2013-05-19 DIAGNOSIS — E049 Nontoxic goiter, unspecified: Secondary | ICD-10-CM | POA: Insufficient documentation

## 2013-05-19 DIAGNOSIS — J449 Chronic obstructive pulmonary disease, unspecified: Secondary | ICD-10-CM | POA: Insufficient documentation

## 2013-05-19 DIAGNOSIS — Z79899 Other long term (current) drug therapy: Secondary | ICD-10-CM | POA: Insufficient documentation

## 2013-05-19 DIAGNOSIS — I10 Essential (primary) hypertension: Secondary | ICD-10-CM | POA: Insufficient documentation

## 2013-05-19 DIAGNOSIS — M549 Dorsalgia, unspecified: Secondary | ICD-10-CM | POA: Insufficient documentation

## 2013-05-19 DIAGNOSIS — J4489 Other specified chronic obstructive pulmonary disease: Secondary | ICD-10-CM | POA: Insufficient documentation

## 2013-05-19 DIAGNOSIS — E7800 Pure hypercholesterolemia, unspecified: Secondary | ICD-10-CM | POA: Insufficient documentation

## 2013-05-19 DIAGNOSIS — Z8719 Personal history of other diseases of the digestive system: Secondary | ICD-10-CM | POA: Insufficient documentation

## 2013-05-19 DIAGNOSIS — F17200 Nicotine dependence, unspecified, uncomplicated: Secondary | ICD-10-CM | POA: Insufficient documentation

## 2013-05-19 DIAGNOSIS — K859 Acute pancreatitis without necrosis or infection, unspecified: Secondary | ICD-10-CM | POA: Insufficient documentation

## 2013-05-19 DIAGNOSIS — Z7982 Long term (current) use of aspirin: Secondary | ICD-10-CM | POA: Insufficient documentation

## 2013-05-19 LAB — COMPREHENSIVE METABOLIC PANEL
Alkaline Phosphatase: 104 U/L (ref 39–117)
BUN: 18 mg/dL (ref 6–23)
CO2: 28 mEq/L (ref 19–32)
Chloride: 101 mEq/L (ref 96–112)
GFR calc Af Amer: >90 mL/min (ref >90)
Glucose, Bld: 96 mg/dL (ref 70–99)
Potassium: 4.2 mEq/L (ref 3.5–5.1)
Total Bilirubin: 0.2 mg/dL — ABNORMAL LOW (ref 0.3–1.2)

## 2013-05-19 LAB — CBC WITH DIFFERENTIAL/PLATELET
Hemoglobin: 15.4 g/dL (ref 13.0–17.0)
Lymphocytes Relative: 17 % (ref 12–46)
Lymphs Abs: 1.6 10*3/uL (ref 0.7–4.0)
MCH: 31 pg (ref 26.0–34.0)
Monocytes Relative: 8 % (ref 3–12)
Neutro Abs: 7.2 10*3/uL (ref 1.7–7.7)
Neutrophils Relative %: 75 % (ref 43–77)
RBC: 4.97 MIL/uL (ref 4.22–5.81)

## 2013-05-19 LAB — LIPASE, BLOOD: Lipase: 46 U/L (ref 11–59)

## 2013-05-19 MED ORDER — ONDANSETRON HCL 4 MG/2ML IJ SOLN
4.0000 mg | Freq: Once | INTRAMUSCULAR | Status: AC
  Administered 2013-05-19: 4 mg via INTRAVENOUS
  Filled 2013-05-19: qty 2

## 2013-05-19 MED ORDER — HYDROMORPHONE HCL PF 1 MG/ML IJ SOLN
1.0000 mg | Freq: Once | INTRAMUSCULAR | Status: AC
  Administered 2013-05-19: 1 mg via INTRAVENOUS
  Filled 2013-05-19: qty 1

## 2013-05-19 MED ORDER — SODIUM CHLORIDE 0.9 % IV SOLN
Freq: Once | INTRAVENOUS | Status: AC
  Administered 2013-05-19: via INTRAVENOUS

## 2013-05-19 MED ORDER — IOHEXOL 300 MG/ML  SOLN
50.0000 mL | Freq: Once | INTRAMUSCULAR | Status: AC | PRN
  Administered 2013-05-19: 50 mL via ORAL

## 2013-05-19 NOTE — ED Provider Notes (Signed)
History     This chart was scribed for [employer]. Colon Branch, MD, MD by Smitty Pluck, ED Scribe. The patient was seen in room APA03/APA03 and the patient's care was started at 11:13 PM.  CSN: [id number] Arrival date & time 05/19/13  2003  Chief Complaint  Patient presents with  . Abdominal Pain   The history is provided by the patient and medical records. No language interpreter was used.   HPI Comments: Seth Savage is a 62 y.o. male who presents to the Emergency Department complaining of sudden, constant, moderate epigastric abdominal pain onset today at 4AM. Pt mentions that he feels bloated. Pt reports hx of gallstones 6 years ago and he states this feels similar. Denies any abdominal surgeries. He reports having chills today and associated back pain. He denies pain being aggravated by eating. He denies taking medication PTA. He states he had normal BM today. Pt denies fever, nausea, vomiting, diarrhea, weakness, cough, SOB and any other pain.    Past Medical History  Diagnosis Date  . Hypertension   . Hypercholesterolemia   . Goiter     now on thyroid medication, s/p radioactive iodine treatment  . Carotid artery occlusion   . COPD (chronic obstructive pulmonary disease)    Past Surgical History  Procedure Laterality Date  . Colonoscopy  08/24/2004     Normal rectum,Small polyp at 25 cm, cold snared/ The remainder of the colonic mucosa appeared normal  . Rotator cuff repair  1992    left, metal implant   . Rotator cuff repair  1999    right  . Colectomy  2000    secondary to intussuception  . Radioactive iodine for thyroid    . Back surgery  2003  . Shoulder surgery    . Multiple bilateral arm/wrist surgeries after falls    . Colonoscopy  09/24/2012    Procedure: COLONOSCOPY;  Surgeon: Corbin Ade, MD;  Location: [facility];  Service: Endoscopy;  Laterality: N/A;  2:00 PM  . Spine surgery    . Carotid endarterectomy    . Decreased functioning of  gb     Family  History  Problem Relation Age of Onset  . Colon cancer Neg Hx   . COPD Mother   . Mental illness Mother   . Hypertension Father   . Alcohol abuse Father    History  Substance Use Topics  . Smoking status: Current Some Day Smoker -- 0.20 packs/day    Types: Cigarettes  . Smokeless tobacco: Never Used  . Alcohol Use: Yes     Comment: 6 pack in a month    Review of Systems 10 Systems reviewed and all are negative for acute change except as noted in the HPI.   Allergies  Lyrica  Home Medications   Current Outpatient Rx  Name  Route  Sig  Dispense  Refill  . aspirin EC 81 MG tablet   Oral   Take 81 mg by mouth daily.         Marland Kitchen atorvastatin (LIPITOR) 10 MG tablet   Oral   Take 10 mg by mouth at bedtime.          Marland Kitchen losartan (COZAAR) 100 MG tablet   Oral   Take 100 mg by mouth at bedtime.          Marland Kitchen SYNTHROID 150 MCG tablet   Oral   Take 150 mcg by mouth every morning.          Marland Kitchen  naproxen (NAPROSYN) 500 MG tablet   Oral   Take 500 mg by mouth 2 (two) times daily with a meal.           BP 152/60  Pulse 74  Temp(Src) 97.8 F (36.6 C) (Oral)  Resp 20  Ht 6' (1.829 m)  SpO2 98% Physical Exam  Nursing note and vitals reviewed. Constitutional: He is oriented to person, place, and time. He appears well-developed and well-nourished. No distress.  HENT:  Head: Normocephalic and atraumatic.  Eyes: Conjunctivae are normal. Pupils are equal, round, and reactive to light.  Neck: Normal range of motion. Neck supple. No tracheal deviation present.  Cardiovascular: Normal rate, regular rhythm and normal heart sounds.   No murmur heard. Pulmonary/Chest: Effort normal and breath sounds normal. No respiratory distress. He has no wheezes. He has no rales.  Abdominal: Soft. Bowel sounds are normal. He exhibits no distension. There is tenderness (mild epigastric ). There is no rebound and no guarding.  No focal tenderness to RUQ  Musculoskeletal: Normal range of  motion.  Neurological: He is alert and oriented to person, place, and time.  Skin: Skin is warm and dry.  Psychiatric: He has a normal mood and affect. His behavior is normal.    ED Course  Procedures (including critical care time) Results for orders placed during the hospital encounter of 05/19/13  CBC WITH DIFFERENTIAL      Result Value Range   WBC 9.7  4.0 - 10.5 K/uL   RBC 4.97  4.22 - 5.81 MIL/uL   Hemoglobin 15.4  13.0 - 17.0 g/dL   HCT 09.8  11.9 - 14.7 %   MCV 88.5  78.0 - 100.0 fL   MCH 31.0  26.0 - 34.0 pg   MCHC 35.0  30.0 - 36.0 g/dL   RDW 82.9  56.2 - 13.0 %   Platelets 191  150 - 400 K/uL   Neutrophils Relative % 75  43 - 77 %   Neutro Abs 7.2  1.7 - 7.7 K/uL   Lymphocytes Relative 17  12 - 46 %   Lymphs Abs 1.6  0.7 - 4.0 K/uL   Monocytes Relative 8  3 - 12 %   Monocytes Absolute 0.8  0.1 - 1.0 K/uL   Eosinophils Relative 1  0 - 5 %   Eosinophils Absolute 0.1  0.0 - 0.7 K/uL   Basophils Relative 0  0 - 1 %   Basophils Absolute 0.0  0.0 - 0.1 K/uL  COMPREHENSIVE METABOLIC PANEL      Result Value Range   Sodium 138  135 - 145 mEq/L   Potassium 4.2  3.5 - 5.1 mEq/L   Chloride 101  96 - 112 mEq/L   CO2 28  19 - 32 mEq/L   Glucose, Bld 96  70 - 99 mg/dL   BUN 18  6 - 23 mg/dL   Creatinine, Ser 8.65  0.50 - 1.35 mg/dL   Calcium 9.3  8.4 - 78.4 mg/dL   Total Protein 7.1  6.0 - 8.3 g/dL   Albumin 3.8  3.5 - 5.2 g/dL   AST 10  0 - 37 U/L   ALT 16  0 - 53 U/L   Alkaline Phosphatase 104  39 - 117 U/L   Total Bilirubin 0.2 (*) 0.3 - 1.2 mg/dL   GFR calc non Af Amer 87 (*) >90 mL/min   GFR calc Af Amer >90  >90 mL/min  LIPASE, BLOOD  Result Value Range   Lipase 46  11 - 59 U/L  AMMONIA      Result Value Range   Ammonia 19  11 - 60 umol/L   DIAGNOSTIC STUDIES: Oxygen Saturation is 98% on room air, normal by my interpretation.    COORDINATION OF CARE: 11:17 PM Discussed ED treatment with pt and pt agrees.  Medications - No data to display  Ct Abdomen  Pelvis W Contrast  05/20/2013   *RADIOLOGY REPORT*  Clinical Data: Epigastric pain beginning at 4:00 am on 05/19/2013.  CT ABDOMEN AND PELVIS WITH CONTRAST  Technique:  Multidetector CT imaging of the abdomen and pelvis was performed following the standard protocol during bolus administration of intravenous contrast.  Contrast: 50mL OMNIPAQUE IOHEXOL 300 MG/ML  SOLN, OMNIPAQUE IOHEXOL 300 MG/ML  SOLN  Comparison: CT abdomen pelvis 06/26/2012.  Findings: Mild linear atelectasis or scar is identified in the lung bases.  There is no pleural or pericardial effusion.  The gallbladder, liver, spleen, adrenal glands and right kidney appear normal.  A punctate nonobstructing stone is identified upper pole of the left kidney.  There is stranding about the head of the pancreas.  The pancreas enhances homogeneously and no fluid collection is identified.  The splenic and portal veins are patent. No focal fluid collection is identified.  The stomach and small and large bowel appear normal.  There is no lymphadenopathy.  No focal bony abnormality is identified.  IMPRESSION:  1.  Mild stranding about the head of the pancreas most consistent with pancreatitis. 2.  Punctate nonobstructing stone upper pole left kidney.   Original Report Authenticated By: Holley Dexter, M.D.   Medications  0.9 %  sodium chloride infusion ( Intravenous New Bag/Given 05/19/13 2340)  ondansetron (ZOFRAN) injection 4 mg (4 mg Intravenous Given 05/19/13 2341)  HYDROmorphone (DILAUDID) injection 1 mg (1 mg Intravenous Given 05/19/13 2341)  iohexol (OMNIPAQUE) 300 MG/ML solution 50 mL (50 mLs Oral Contrast Given 05/19/13 2334)  iohexol (OMNIPAQUE) 300 MG/ML solution 100 mL (100 mLs Intravenous Contrast Given 05/20/13 0036)  HYDROmorphone (DILAUDID) injection 1 mg (1 mg Intravenous Given 05/20/13 0206)     MDM  Patient presents with abdominal pain to the epigastric area with pain that radiates to his back. Denies vomiting, diarrhea. Began around  4 AM and has been steady throughout the day. Given dilaudid x 2 and zofran with relief. Labs are unremarkable. CT with stranding around the head of the pancreas c/w pancreatitis. Reviewed results with patient and his wife.  Pt stable in ED with no significant deterioration in condition.The patient appears reasonably screened and/or stabilized for discharge and I doubt any other medical condition or other [facility] requiring further screening, evaluation, or treatment in the ED at this time prior to discharge.  I personally performed the services described in this documentation, which was scribed in my presence. The recorded information has been reviewed and considered.  MDM Reviewed: nursing note and vitals Interpretation: labs and CT scan        Nicoletta Dress. Colon Branch, MD 05/20/13 [phone]

## 2013-05-19 NOTE — ED Notes (Addendum)
Pt states he has had gallbladder problems in the past, Dr. Benard Rink told the pt his gallbladder "only empties partially and is about 50 percent effective", pt states that was about 5 years ago. Pt co pain that starts under rt ribs and radiates to back, described as "squeezing".

## 2013-05-19 NOTE — ED Notes (Signed)
abd pain, no vomiting,  No diarrhea.  Onset 4 am.

## 2013-05-20 MED ORDER — HYDROMORPHONE HCL PF 1 MG/ML IJ SOLN
1.0000 mg | Freq: Once | INTRAMUSCULAR | Status: AC
  Administered 2013-05-20: 1 mg via INTRAVENOUS
  Filled 2013-05-20: qty 1

## 2013-05-20 MED ORDER — IOHEXOL 300 MG/ML  SOLN
100.0000 mL | Freq: Once | INTRAMUSCULAR | Status: AC | PRN
  Administered 2013-05-20: 100 mL via INTRAVENOUS

## 2013-05-20 MED ORDER — HYDROCODONE-ACETAMINOPHEN 5-325 MG PO TABS: 1.0000 | ORAL_TABLET | ORAL | Status: DC | PRN

## 2013-05-20 MED ORDER — ONDANSETRON 4 MG PO TBDP: 4.0000 mg | ORAL_TABLET | Freq: Three times a day (TID) | ORAL | Status: DC | PRN

## 2013-05-20 NOTE — Discharge Instructions (Signed)
Your blood work is all normal. Your CT shows a pancreatitis. Eat bland food for several days and progress as tolerated.    Acute Pancreatitis Acute pancreatitis is a disease in which the pancreas becomes suddenly inflamed. The pancreas is a large gland located behind your stomach. The pancreas produces enzymes that help digest food. The pancreas also releases the hormones glucagon and insulin that help regulate blood sugar. Damage to the pancreas occurs when the digestive enzymes from the pancreas are activated and begin attacking the pancreas before being released into the intestine. Most acute attacks last a couple of days and can cause serious complications. Some people become dehydrated and develop low blood pressure. In severe cases, bleeding into the pancreas can lead to shock and can be life-threatening. The lungs, heart, and kidneys may fail. CAUSES  Pancreatitis can happen to anyone. In some cases, the cause is unknown. Most cases are caused by:  Alcohol abuse.  Gallstones. Other less common causes are:  Certain medicines.  Exposure to certain chemicals.  Infection.  Damage caused by an accident (trauma).  Abdominal surgery. SYMPTOMS   Pain in the upper abdomen that may radiate to the back.  Tenderness and swelling of the abdomen.  Nausea and vomiting. DIAGNOSIS  Your caregiver will perform a physical exam. Blood and stool tests may be done to confirm the diagnosis. Imaging tests may also be done, such as X-rays, CT scans, or an ultrasound of the abdomen. TREATMENT  Treatment usually requires a stay in the hospital. Treatment may include:  Pain medicine.  Fluid replacement through an intravenous line (IV).  Placing a tube in the stomach to remove stomach contents and control vomiting.  Not eating for 3 or 4 days. This gives your pancreas a rest, because enzymes are not being produced that can cause further damage.  Antibiotic medicines if your condition is caused  by an infection.  Surgery of the pancreas or gallbladder. HOME CARE INSTRUCTIONS   Follow the diet advised by your caregiver. This may involve avoiding alcohol and decreasing the amount of fat in your diet.  Eat smaller, more frequent meals. This reduces the amount of digestive juices the pancreas produces.  Drink enough fluids to keep your urine clear or pale yellow.  Only take over-the-counter or prescription medicines as directed by your caregiver.  Avoid drinking alcohol if it caused your condition.  Do not smoke.  Get plenty of rest.  Check your blood sugar at home as directed by your caregiver.  Keep all follow-up appointments as directed by your caregiver. SEEK MEDICAL CARE IF:   You do not recover as quickly as expected.  You develop new or worsening symptoms.  You have persistent pain, weakness, or nausea.  You recover and then have another episode of pain. SEEK IMMEDIATE MEDICAL CARE IF:   You are unable to eat or keep fluids down.  Your pain becomes severe.  You have a fever or persistent symptoms for more than 2 to 3 days.  You have a fever and your symptoms suddenly get worse.  Your skin or the white part of your eyes turn yellow (jaundice).  You develop vomiting.  You feel dizzy, or you faint.  Your blood sugar is high (over 300 mg/dL). MAKE SURE YOU:   Understand these instructions.  Will watch your condition.  Will get help right away if you are not doing well or get worse. Document Released: 11/06/2005 Document Revised: 05/07/2012 Document Reviewed: 02/15/2012 [facility] Patient Information 2014 [place], [place].

## 2013-05-20 NOTE — ED Notes (Signed)
Pt alert & oriented x4, stable gait. Patient given discharge instructions, paperwork & prescription(s). Patient  instructed to stop at the registration desk to finish any additional paperwork. Patient verbalized understanding. Pt left department w/ no further questions. 

## 2013-08-29 ENCOUNTER — Other Ambulatory Visit (HOSPITAL_COMMUNITY): Payer: Self-pay | Admitting: Family Medicine

## 2013-08-29 DIAGNOSIS — R51 Headache: Secondary | ICD-10-CM

## 2013-09-01 ENCOUNTER — Ambulatory Visit (HOSPITAL_COMMUNITY)
Admission: RE | Admit: 2013-09-01 | Discharge: 2013-09-01 | Disposition: A | Discharge status: Home / Self Care | Payer: Medicare Other | Source: Ambulatory Visit | Attending: Family Medicine | Admitting: Family Medicine

## 2013-09-01 ENCOUNTER — Other Ambulatory Visit (HOSPITAL_COMMUNITY): Payer: Self-pay | Admitting: Family Medicine

## 2013-09-01 DIAGNOSIS — R51 Headache: Secondary | ICD-10-CM | POA: Insufficient documentation

## 2013-09-01 DIAGNOSIS — H5710 Ocular pain, unspecified eye: Secondary | ICD-10-CM | POA: Insufficient documentation

## 2013-09-01 DIAGNOSIS — R220 Localized swelling, mass and lump, head: Secondary | ICD-10-CM | POA: Insufficient documentation

## 2013-09-01 MED ORDER — GADOBENATE DIMEGLUMINE 529 MG/ML IV SOLN
20.0000 mL | Freq: Once | INTRAVENOUS | Status: AC | PRN
  Administered 2013-09-01: 20 mL via INTRAVENOUS

## 2013-09-02 ENCOUNTER — Ambulatory Visit (HOSPITAL_COMMUNITY): Payer: Medicare Other

## 2013-09-02 LAB — POCT I-STAT CREATININE: Creatinine, Ser: 1.3 mg/dL (ref 0.50–1.35)

## 2013-09-03 ENCOUNTER — Ambulatory Visit (INDEPENDENT_AMBULATORY_CARE_PROVIDER_SITE_OTHER): Payer: Medicare Other | Admitting: Neurology

## 2013-09-03 ENCOUNTER — Encounter: Payer: Self-pay | Admitting: Neurology

## 2013-09-03 VITALS — BP 171/78 | HR 72 | Ht 73.0 in | Wt 207.0 lb

## 2013-09-03 DIAGNOSIS — R519 Headache, unspecified: Secondary | ICD-10-CM | POA: Insufficient documentation

## 2013-09-03 DIAGNOSIS — R51 Headache: Secondary | ICD-10-CM | POA: Insufficient documentation

## 2013-09-03 MED ORDER — TOPIRAMATE 100 MG PO TABS: 100.0000 mg | ORAL_TABLET | Freq: Two times a day (BID) | ORAL | Status: DC

## 2013-09-03 MED ORDER — RIZATRIPTAN BENZOATE 5 MG PO TBDP: 5.0000 mg | ORAL_TABLET | ORAL | Status: DC | PRN

## 2013-09-03 NOTE — Progress Notes (Signed)
[facility]  PATIENT: Seth Savage DOB: [birth year]  HISTORICAL Seth Savage is a 62 years old right-handed Caucasian male, referred by his primary care physician Dr. Regino Savage for evaluation of headaches.  He had a history of falling injury in 2003, he fell off the roof, injured his right hand, broke his left shoulder, but there was no significant head trauma, no loss of consciousness,  He had a history of headaches since teenager, they came in clusters, during those headache seasons, he could have 2 or 3 headaches in the day, feels horrible, it usually lasted about 2 months,  Last flare up was in 2009, he had frequent left-sided headaches, Topamax was helpful during that period of time.  Since August 2014, he began to have frequent headaches again, he had left frontal, behind left eye severe pain, he felt his left eye bulging out, blood shot red, small left pupil, headache last few hours sometimes whole day, sometimes he has blurry vision, he has to tilt his head to be able to read.  His headache would get worse when he lies down, gets better if he sits up,  He has tried Vicodin without much help,  REVIEW OF SYSTEMS: Full 14 system review of systems performed and notable only for confusion, headaches, ringing in ears, achy muscles, joint pain, blurred vision, eye pain  ALLERGIES: Allergies  Allergen Reactions  . Lyrica [Pregabalin] Shortness Of Breath    HOME MEDICATIONS: Outpatient Prescriptions Prior to Visit  Medication Sig Dispense Refill  . aspirin EC 81 MG tablet Take 81 mg by mouth daily.      Marland Kitchen HYDROcodone-acetaminophen (NORCO/VICODIN) 5-325 MG per tablet Take 1 tablet by mouth every 4 (four) hours as needed for pain.  30 tablet  0  . losartan (COZAAR) 100 MG tablet Take 100 mg by mouth at bedtime.       . naproxen (NAPROSYN) 500 MG tablet Take 500 mg by mouth 2 (two) times daily with a meal.       . ondansetron (ZOFRAN ODT) 4 MG disintegrating tablet  Take 1 tablet (4 mg total) by mouth every 8 (eight) hours as needed for nausea.  20 tablet  0  . SYNTHROID 150 MCG tablet Take 150 mcg by mouth every morning.       Marland Kitchen atorvastatin (LIPITOR) 10 MG tablet Take 10 mg by mouth at bedtime.        No facility-administered medications prior to visit.    PAST MEDICAL HISTORY: Past Medical History  Diagnosis Date  . Hypertension   . Hypercholesterolemia   . Goiter     now on thyroid medication, s/p radioactive iodine treatment  . Carotid artery occlusion, Left    . COPD (chronic obstructive pulmonary disease)     PAST SURGICAL HISTORY: Past Surgical History  Procedure Laterality Date  . Colonoscopy  08/24/2004     Normal rectum,Small polyp at 25 cm, cold snared/ The remainder of the colonic mucosa appeared normal  . Rotator cuff repair  1992    left, metal implant   . Rotator cuff repair  1999    right  . Colectomy  2000    secondary to intussuception  . Radioactive iodine for thyroid    . Back surgery  2003  . Shoulder surgery    . Multiple bilateral arm/wrist surgeries after falls    . Colonoscopy  09/24/2012    Procedure: COLONOSCOPY;  Surgeon: Corbin Ade, MD;  Location: [facility];  Service:  Endoscopy;  Laterality: N/A;  2:00 PM  . Spine surgery    . Carotid endarterectomy, left side.    . Decreased functioning of  Gallbladder      FAMILY HISTORY: Family History  Problem Relation Age of Onset  . Colon cancer Neg Hx   . COPD Mother   . Mental illness Mother   . Hypertension Father   . Alcohol abuse Father     SOCIAL HISTORY:  History   Social History  . Marital Status: Married    Spouse Name: Margie    Number of Children: 0  . 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   Social History Main Topics  . Smoking status: Current Some Day Smoker -- 0.20 packs/day for 30 years    Types: Cigarettes  . Smokeless tobacco: Never Used  . Alcohol Use: Yes       Comment: 6 pack in a month  . Drug Use: No  . Sexual Activity: Not on file    Social History Narrative   Patient lives at home with his wife. [facility]).    Disabled.   Right handed.   Caffeine- two cups daily.   Three step children.     PHYSICAL EXAM   Filed Vitals:   09/03/13 1035  BP: 171/78  Pulse: 72  Height: 6\' 1"  (1.854 m)  Weight: 207 lb (93.895 kg)   Body mass index is 27.32 kg/(m^2).   Generalized: In no acute distress  Neck: Supple, no carotid bruits   Cardiac: Regular rate rhythm  Pulmonary: Clear to auscultation bilaterally  Musculoskeletal: No deformity  Neurological examination  Mentation: Alert oriented to time, place, history taking, and causual conversation  Cranial nerve II-XII: Pupils were equal round reactive to light extraocular movements were full, visual field were full on confrontational test. facial sensation and strength were normal. hearing was intact to finger rubbing bilaterally. Uvula tongue midline.  head turning and shoulder shrug and were normal and symmetric.Tongue protrusion into cheek strength was normal.  Motor: normal tone, bulk and strength.  Sensory: Intact to fine touch, pinprick, preserved vibratory sensation, and proprioception at toes.  Coordination: Normal finger to nose, heel-to-shin bilaterally there was no truncal ataxia  Gait: Rising up from seated position without assistance, normal stance, without trunk ataxia, moderate stride, good arm swing, smooth turning, able to perform tiptoe, and heel walking without difficulty.   Romberg signs: Negative  Deep tendon reflexes: Brachioradialis 2/2, biceps 2/2, triceps 2/2, patellar 2/2, Achilles 2/2, plantar responses were flexor bilaterally.   DIAGNOSTIC DATA (LABS, IMAGING, TESTING) - I reviewed patient records, labs, notes, testing and imaging myself where available.  Lab Results  Component Value Date   WBC 9.7 05/19/2013   HGB 15.4 05/19/2013   HCT 44.0  05/19/2013   MCV 88.5 05/19/2013   PLT 191 05/19/2013      Component Value Date/Time   NA 138 05/19/2013 2120   K 4.2 05/19/2013 2120   CL 101 05/19/2013 2120   CO2 28 05/19/2013 2120   GLUCOSE 96 05/19/2013 2120   BUN 18 05/19/2013 2120   CREATININE 1.30 09/01/2013 1858   CALCIUM 9.3 05/19/2013 2120   PROT 7.1 05/19/2013 2120   ALBUMIN 3.8 05/19/2013 2120   AST 10 05/19/2013 2120   ALT 16 05/19/2013 2120   ALKPHOS 104 05/19/2013 2120   BILITOT 0.2* 05/19/2013 2120   GFRNONAA 87* 05/19/2013 2120   GFRAA >90  05/19/2013 2120    ASSESSMENT AND PLAN   62 years old male, with history of left-sided headaches, has features consistent with cluster headaches, normal neurological examination  1. Will try preventive medications Topamax 100 mg twice a day 2 Maxalt as needed 3 return to clinic  In one month  .        Levert Feinstein, M.D. Ph.D.  [facility] [address] [place], [place] [address] [phone]

## 2013-10-06 ENCOUNTER — Encounter: Payer: Self-pay | Admitting: Neurology

## 2013-10-06 ENCOUNTER — Encounter (INDEPENDENT_AMBULATORY_CARE_PROVIDER_SITE_OTHER): Payer: Self-pay

## 2013-10-06 ENCOUNTER — Ambulatory Visit (INDEPENDENT_AMBULATORY_CARE_PROVIDER_SITE_OTHER): Payer: Medicare Other | Admitting: Neurology

## 2013-10-06 VITALS — BP 163/87 | HR 76 | Ht 72.0 in | Wt 207.0 lb

## 2013-10-06 DIAGNOSIS — R51 Headache: Secondary | ICD-10-CM

## 2013-10-06 NOTE — Progress Notes (Signed)
[facility]  PATIENT: Seth Savage DOB: 03/01/1951  HISTORICAL Seth Savage is a 62 years old right-handed Caucasian male, referred by his primary care physician Dr. Regino Schultze for evaluation of headaches.  He had a history of falling injury in 2003, he fell off the roof, injured his right hand, broke his left shoulder, but there was no significant head trauma, no loss of consciousness,  He had a history of headaches since teenager, they came in clusters, during those headache seasons, he could have 2 or 3 headaches in a day, feels horrible, it usually lasted about 2 months,  Last flare up was in 2009, he had frequent left-sided headaches, Topamax was helpful during that period of time.  Since August 2014, he began to have frequent headaches again, he had left frontal, behind left eye severe pain, he felt his left eye bulging out, blood shot red, small left pupil, he has tearing from his left eye, but no running nose, his headache last few hours sometimes whole day, sometimes he has blurry vision, he has to tilt his head to be able to read.  His headache would get worse when he lies down, gets better if he sits up,  He has tried Vicodin without much help.  UPDATE 10/06/2013:  After starting Topamax 100 mg twice a day, 2 and half weeks later, his headaches almost disappeared, he has never tried Maxalt, worrying about the side effect, he has stopped Topamax about a week ago, there is no recurrent of his headaches, he was put on antibiotics, because of right ear infection, stuffiness,.     REVIEW OF SYSTEMS: Full 14 system review of systems performed and notable only for confusion, headaches, ringing in ears, achy muscles, joint pain, blurred vision, eye pain  ALLERGIES: Allergies  Allergen Reactions  . Lyrica [Pregabalin] Shortness Of Breath    HOME MEDICATIONS: Outpatient Prescriptions Prior to Visit  Medication Sig Dispense Refill  . aspirin EC 81 MG tablet Take  81 mg by mouth daily.      Marland Kitchen HYDROcodone-acetaminophen (NORCO/VICODIN) 5-325 MG per tablet Take 1 tablet by mouth every 4 (four) hours as needed for pain.  30 tablet  0  . losartan (COZAAR) 100 MG tablet Take 100 mg by mouth at bedtime.       . naproxen (NAPROSYN) 500 MG tablet Take 500 mg by mouth 2 (two) times daily with a meal.       . ondansetron (ZOFRAN ODT) 4 MG disintegrating tablet Take 1 tablet (4 mg total) by mouth every 8 (eight) hours as needed for nausea.  20 tablet  0  . rizatriptan (MAXALT-MLT) 5 MG disintegrating tablet Take 1 tablet (5 mg total) by mouth as needed for migraine. May repeat in 2 hours if needed  15 tablet  6  . SYNTHROID 150 MCG tablet Take 150 mcg by mouth every morning.       . topiramate (TOPAMAX) 100 MG tablet Take 1 tablet (100 mg total) by mouth 2 (two) times daily.  60 tablet  12   No facility-administered medications prior to visit.    PAST MEDICAL HISTORY: Past Medical History  Diagnosis Date  . Hypertension   . Hypercholesterolemia   . Goiter     now on thyroid medication, s/p radioactive iodine treatment  . Carotid artery occlusion, Left    . COPD (chronic obstructive pulmonary disease)     PAST SURGICAL HISTORY: Past Surgical History  Procedure Laterality Date  . Colonoscopy  08/24/2004  Normal rectum,Small polyp at 25 cm, cold snared/ The remainder of the colonic mucosa appeared normal  . Rotator cuff repair  1992    left, metal implant   . Rotator cuff repair  1999    right  . Colectomy  2000    secondary to intussuception  . Radioactive iodine for thyroid    . Back surgery  2003  . Shoulder surgery    . Multiple bilateral arm/wrist surgeries after falls    . Colonoscopy  09/24/2012    Procedure: COLONOSCOPY;  Surgeon: Corbin Ade, MD;  Location: [facility];  Service: Endoscopy;  Laterality: N/A;  2:00 PM  . Spine surgery    . Carotid endarterectomy, left side.    . Decreased functioning of  Gallbladder      FAMILY  HISTORY: Family History  Problem Relation Age of Onset  . Colon cancer Neg Hx   . COPD Mother   . Mental illness Mother   . Hypertension Father   . Alcohol abuse Father     SOCIAL HISTORY:  History   Social History  . Marital Status: Married    Spouse Name: Margie    Number of Children: 0  . 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   Social History Main Topics  . Smoking status: Current Some Day Smoker -- 0.20 packs/day for 30 years    Types: Cigarettes  . Smokeless tobacco: Never Used  . Alcohol Use: Yes     Comment: 6 pack in a month  . Drug Use: No  . Sexual Activity: Not on file    Social History Narrative   Patient lives at home with his wife. [facility]).    Disabled.   Right handed.   Caffeine- two cups daily.   Three step children.     PHYSICAL EXAM   Filed Vitals:   10/06/13 1048  BP: 163/87  Pulse: 76  Height: 6' (1.829 m)  Weight: 207 lb (93.895 kg)   Body mass index is 28.07 kg/(m^2).   Generalized: In no acute distress  Neck: Supple, no carotid bruits   Cardiac: Regular rate rhythm  Pulmonary: Clear to auscultation bilaterally  Musculoskeletal: No deformity  Neurological examination  Mentation: Alert oriented to time, place, history taking, and causual conversation  Cranial nerve II-XII: Pupils were equal round reactive to light extraocular movements were full, visual field were full on confrontational test. facial sensation and strength were normal. hearing was intact to finger rubbing bilaterally. Uvula tongue midline.  head turning and shoulder shrug and were normal and symmetric.Tongue protrusion into cheek strength was normal.bilateral tympanic membrane was clear,      Motor: normal tone, bulk and strength.  Sensory: Intact to fine touch, pinprick, preserved vibratory sensation, and proprioception at toes.  Coordination: Normal finger to nose, heel-to-shin bilaterally  there was no truncal ataxia  Gait: Rising up from seated position without assistance, normal stance, without trunk ataxia, moderate stride, good arm swing, smooth turning, able to perform tiptoe, and heel walking without difficulty.   Romberg signs: Negative  Deep tendon reflexes: Brachioradialis 2/2, biceps 2/2, triceps 2/2, patellar 2/2, Achilles 2/2, plantar responses were flexor bilaterally.   DIAGNOSTIC DATA (LABS, IMAGING, TESTING) - I reviewed patient records, labs, notes, testing and imaging myself where available.  Lab Results  Component Value Date   WBC 9.7 05/19/2013   HGB 15.4 05/19/2013   HCT 44.0 05/19/2013  MCV 88.5 05/19/2013   PLT 191 05/19/2013      Component Value Date/Time   NA 138 05/19/2013 2120   K 4.2 05/19/2013 2120   CL 101 05/19/2013 2120   CO2 28 05/19/2013 2120   GLUCOSE 96 05/19/2013 2120   BUN 18 05/19/2013 2120   CREATININE 1.30 09/01/2013 1858   CALCIUM 9.3 05/19/2013 2120   PROT 7.1 05/19/2013 2120   ALBUMIN 3.8 05/19/2013 2120   AST 10 05/19/2013 2120   ALT 16 05/19/2013 2120   ALKPHOS 104 05/19/2013 2120   BILITOT 0.2* 05/19/2013 2120   GFRNONAA 87* 05/19/2013 2120   GFRAA >90 05/19/2013 2120    ASSESSMENT AND PLAN   62 years old male, with history of left-sided headaches, has features consistent with cluster headaches, normal neurological examination, responded very well to Topamax   Return to clinic as needed    Levert Feinstein, M.D. Ph.D.  [facility] [address], [address] [place], [place] [address] [phone]

## 2013-11-06 ENCOUNTER — Ambulatory Visit (INDEPENDENT_AMBULATORY_CARE_PROVIDER_SITE_OTHER): Payer: Medicare Other | Admitting: Otolaryngology

## 2013-11-06 ENCOUNTER — Encounter (INDEPENDENT_AMBULATORY_CARE_PROVIDER_SITE_OTHER): Payer: Self-pay

## 2013-11-06 DIAGNOSIS — H6980 Other specified disorders of Eustachian tube, unspecified ear: Secondary | ICD-10-CM

## 2013-11-06 DIAGNOSIS — J343 Hypertrophy of nasal turbinates: Secondary | ICD-10-CM

## 2013-11-06 DIAGNOSIS — H903 Sensorineural hearing loss, bilateral: Secondary | ICD-10-CM

## 2013-11-06 DIAGNOSIS — J342 Deviated nasal septum: Secondary | ICD-10-CM

## 2013-11-06 DIAGNOSIS — H902 Conductive hearing loss, unspecified: Secondary | ICD-10-CM

## 2013-11-06 DIAGNOSIS — H6990 Unspecified Eustachian tube disorder, unspecified ear: Secondary | ICD-10-CM

## 2013-12-04 ENCOUNTER — Ambulatory Visit (INDEPENDENT_AMBULATORY_CARE_PROVIDER_SITE_OTHER): Payer: Medicare Other | Admitting: Otolaryngology

## 2013-12-04 DIAGNOSIS — H903 Sensorineural hearing loss, bilateral: Secondary | ICD-10-CM

## 2013-12-04 DIAGNOSIS — H6980 Other specified disorders of Eustachian tube, unspecified ear: Secondary | ICD-10-CM

## 2013-12-10 ENCOUNTER — Encounter: Payer: Self-pay | Admitting: Family

## 2013-12-11 ENCOUNTER — Ambulatory Visit (HOSPITAL_COMMUNITY)
Admission: RE | Admit: 2013-12-11 | Discharge: 2013-12-11 | Disposition: A | Discharge status: Home / Self Care | Payer: Medicare Other | Source: Ambulatory Visit | Attending: Family | Admitting: Family

## 2013-12-11 ENCOUNTER — Ambulatory Visit (INDEPENDENT_AMBULATORY_CARE_PROVIDER_SITE_OTHER): Payer: Medicare Other | Admitting: Family

## 2013-12-11 ENCOUNTER — Encounter (INDEPENDENT_AMBULATORY_CARE_PROVIDER_SITE_OTHER): Payer: Self-pay

## 2013-12-11 DIAGNOSIS — Z48812 Encounter for surgical aftercare following surgery on the circulatory system: Secondary | ICD-10-CM

## 2013-12-11 DIAGNOSIS — I6529 Occlusion and stenosis of unspecified carotid artery: Secondary | ICD-10-CM | POA: Insufficient documentation

## 2013-12-11 DIAGNOSIS — I658 Occlusion and stenosis of other precerebral arteries: Secondary | ICD-10-CM | POA: Insufficient documentation

## 2013-12-12 ENCOUNTER — Other Ambulatory Visit: Payer: Self-pay

## 2013-12-12 DIAGNOSIS — Z48812 Encounter for surgical aftercare following surgery on the circulatory system: Secondary | ICD-10-CM

## 2013-12-12 DIAGNOSIS — I6529 Occlusion and stenosis of unspecified carotid artery: Secondary | ICD-10-CM

## 2013-12-12 NOTE — Patient Instructions (Signed)
Please return in One Year for a repeat scan of the Carotid Artery.     Stroke Prevention Some medical conditions and behaviors are associated with an increased chance of having a stroke. You may prevent a stroke by making healthy choices and managing medical conditions. HOW CAN I REDUCE MY RISK OF HAVING A STROKE?   Stay physically active. Get at least 30 minutes of activity on most or all days.  Do not smoke. It may also be helpful to avoid exposure to secondhand smoke.  Limit alcohol use. Moderate alcohol use is considered to be:  No more than 2 drinks per day for men.  No more than 1 drink per day for nonpregnant women.  Eat healthy foods. This involves  Eating 5 or more servings of fruits and vegetables a day.  Following a diet that addresses high blood pressure (hypertension), high cholesterol, diabetes, or obesity.  Manage your cholesterol levels.  A diet low in saturated fat, trans fat, and cholesterol and high in fiber may control cholesterol levels.  Take any prescribed medicines to control cholesterol as directed by your health care provider.  Manage your diabetes.  A controlled-carbohydrate, controlled-sugar diet is recommended to manage diabetes.  Take any prescribed medicines to control diabetes as directed by your health care provider.  Control your hypertension.  A low-salt (sodium), low-saturated fat, low-trans fat, and low-cholesterol diet is recommended to manage hypertension.  Take any prescribed medicines to control hypertension as directed by your health care provider.  Maintain a healthy weight.  A reduced-calorie, low-sodium, low-saturated fat, low-trans fat, low-cholesterol diet is recommended to manage weight.  Stop drug abuse.  Avoid taking birth control pills.  Talk to your health care provider about the risks of taking birth control pills if you are over 56 years old, smoke, get migraines, or have ever had a blood clot.  Get evaluated  for sleep disorders (sleep apnea).  Talk to your health care provider about getting a sleep evaluation if you snore a lot or have excessive sleepiness.  Take medicines as directed by your health care provider.  For some people, aspirin or blood thinners (anticoagulants) are helpful in reducing the risk of forming abnormal blood clots that can lead to stroke. If you have the irregular heart rhythm of atrial fibrillation, you should be on a blood thinner unless there is a good reason you cannot take them.  Understand all your medicine instructions.  Make sure that other other conditions (such as anemia or atherosclerosis) are addressed. SEEK IMMEDIATE MEDICAL CARE IF:   You have sudden weakness or numbness of the face, arm, or leg, especially on one side of the body.  Your face or eyelid droops to one side.  You have sudden confusion.  You have trouble speaking (aphasia) or understanding.  You have sudden trouble seeing in one or both eyes.  You have sudden trouble walking.  You have dizziness.  You have a loss of balance or coordination.  You have a sudden, severe headache with no known cause.  You have new chest pain or an irregular heartbeat. Any of these symptoms may represent a serious problem that is an emergency. Do not wait to see if the symptoms will go away. Get medical help at once. Call your local emergency services  (911 in [place] Do not drive yourself to the hospital. Document Released: 12/14/2004 Document Revised: 08/27/2013 Document Reviewed: 05/09/2013 [facility] Patient Information 2014 [place].

## 2013-12-15 ENCOUNTER — Encounter: Payer: Self-pay | Admitting: Vascular Surgery

## 2014-01-15 ENCOUNTER — Ambulatory Visit (INDEPENDENT_AMBULATORY_CARE_PROVIDER_SITE_OTHER): Payer: Medicare Other | Admitting: Otolaryngology

## 2014-01-15 DIAGNOSIS — J342 Deviated nasal septum: Secondary | ICD-10-CM

## 2014-01-15 DIAGNOSIS — J310 Chronic rhinitis: Secondary | ICD-10-CM

## 2014-01-15 DIAGNOSIS — H6980 Other specified disorders of Eustachian tube, unspecified ear: Secondary | ICD-10-CM

## 2014-08-27 ENCOUNTER — Encounter (HOSPITAL_COMMUNITY): Payer: Self-pay | Admitting: Emergency Medicine

## 2014-08-27 ENCOUNTER — Emergency Department (HOSPITAL_COMMUNITY): Payer: Medicare Other

## 2014-08-27 ENCOUNTER — Emergency Department (HOSPITAL_COMMUNITY)
Admission: EM | Admit: 2014-08-27 | Discharge: 2014-08-27 | Disposition: A | Discharge status: Home / Self Care | Payer: Medicare Other | Attending: Emergency Medicine | Admitting: Emergency Medicine

## 2014-08-27 DIAGNOSIS — J449 Chronic obstructive pulmonary disease, unspecified: Secondary | ICD-10-CM | POA: Diagnosis not present

## 2014-08-27 DIAGNOSIS — R350 Frequency of micturition: Secondary | ICD-10-CM | POA: Insufficient documentation

## 2014-08-27 DIAGNOSIS — Z720 Tobacco use: Secondary | ICD-10-CM | POA: Diagnosis not present

## 2014-08-27 DIAGNOSIS — Z791 Long term (current) use of non-steroidal anti-inflammatories (NSAID): Secondary | ICD-10-CM | POA: Insufficient documentation

## 2014-08-27 DIAGNOSIS — Z79899 Other long term (current) drug therapy: Secondary | ICD-10-CM | POA: Insufficient documentation

## 2014-08-27 DIAGNOSIS — R42 Dizziness and giddiness: Secondary | ICD-10-CM | POA: Insufficient documentation

## 2014-08-27 DIAGNOSIS — R61 Generalized hyperhidrosis: Secondary | ICD-10-CM | POA: Diagnosis not present

## 2014-08-27 DIAGNOSIS — R079 Chest pain, unspecified: Secondary | ICD-10-CM | POA: Diagnosis present

## 2014-08-27 DIAGNOSIS — Z7982 Long term (current) use of aspirin: Secondary | ICD-10-CM | POA: Insufficient documentation

## 2014-08-27 DIAGNOSIS — I10 Essential (primary) hypertension: Secondary | ICD-10-CM | POA: Diagnosis not present

## 2014-08-27 DIAGNOSIS — R002 Palpitations: Secondary | ICD-10-CM | POA: Diagnosis not present

## 2014-08-27 DIAGNOSIS — R5383 Other fatigue: Secondary | ICD-10-CM | POA: Insufficient documentation

## 2014-08-27 DIAGNOSIS — E860 Dehydration: Secondary | ICD-10-CM | POA: Diagnosis not present

## 2014-08-27 LAB — COMPREHENSIVE METABOLIC PANEL
ALK PHOS: 114 U/L (ref 39–117)
ALT: 20 U/L (ref 0–53)
AST: 12 U/L (ref 0–37)
Albumin: 4 g/dL (ref 3.5–5.2)
Anion gap: 12 (ref 5–15)
BUN: 28 mg/dL — ABNORMAL HIGH (ref 6–23)
CO2: 25 meq/L (ref 19–32)
Calcium: 9.1 mg/dL (ref 8.4–10.5)
Chloride: 98 mEq/L (ref 96–112)
Creatinine, Ser: 1.22 mg/dL (ref 0.50–1.35)
GFR, EST AFRICAN AMERICAN: 72 mL/min — AB (ref >90)
GFR, EST NON AFRICAN AMERICAN: 62 mL/min — AB (ref >90)
GLUCOSE: 107 mg/dL — AB (ref 70–99)
POTASSIUM: 4.1 meq/L (ref 3.7–5.3)
Sodium: 135 mEq/L — ABNORMAL LOW (ref 137–147)
Total Bilirubin: 0.3 mg/dL (ref 0.3–1.2)
Total Protein: 7.3 g/dL (ref 6.0–8.3)

## 2014-08-27 LAB — TROPONIN I: Troponin I: <0.3 ng/mL (ref <0.30)

## 2014-08-27 LAB — CBC WITH DIFFERENTIAL/PLATELET
Basophils Absolute: 0.1 10*3/uL (ref 0.0–0.1)
Basophils Relative: 1 % (ref 0–1)
EOS ABS: 0.1 10*3/uL (ref 0.0–0.7)
EOS PCT: 1 % (ref 0–5)
HCT: 44.9 % (ref 39.0–52.0)
HEMOGLOBIN: 15.8 g/dL (ref 13.0–17.0)
LYMPHS ABS: 1.7 10*3/uL (ref 0.7–4.0)
Lymphocytes Relative: 20 % (ref 12–46)
MCH: 30.9 pg (ref 26.0–34.0)
MCHC: 35.2 g/dL (ref 30.0–36.0)
MCV: 87.7 fL (ref 78.0–100.0)
MONOS PCT: 7 % (ref 3–12)
Monocytes Absolute: 0.6 10*3/uL (ref 0.1–1.0)
NEUTROS PCT: 71 % (ref 43–77)
Neutro Abs: 5.9 10*3/uL (ref 1.7–7.7)
Platelets: 189 10*3/uL (ref 150–400)
RBC: 5.12 MIL/uL (ref 4.22–5.81)
RDW: 13.2 % (ref 11.5–15.5)
WBC: 8.3 10*3/uL (ref 4.0–10.5)

## 2014-08-27 MED ORDER — SODIUM CHLORIDE 0.9 % IV BOLUS (SEPSIS)
500.0000 mL | Freq: Once | INTRAVENOUS | Status: AC
  Administered 2014-08-27: 500 mL via INTRAVENOUS

## 2014-08-27 NOTE — Discharge Instructions (Signed)
Stop the diuretic and follow up with your md next week.

## 2014-08-27 NOTE — ED Provider Notes (Signed)
CSN: [id number]     Arrival date & time 08/27/14  1459 History  This chart was scribed for Maudry Diego, MD by Zola Button, ED Scribe. This patient was seen in room APA17/[facility]17 and the patient's care was started at 3:25 PM.     No chief complaint on file.     Patient is a 63 y.o. male presenting with chest pain. The history is provided by the patient. No language interpreter was used.  Chest Pain Pain location:  L chest Pain radiates to:  Does not radiate Pain radiates to the back: no   Onset quality:  Gradual Duration:  1 day Timing:  Constant Progression:  Unchanged Chronicity:  New Relieved by:  Nothing Worsened by:  Nothing tried Ineffective treatments:  None tried Associated symptoms: diaphoresis, fatigue and palpitations   Associated symptoms: no abdominal pain, no back pain, no cough and no headache    HPI Comments: Seth Savage is a 63 y.o. male who presents to the Emergency Department complaining of gradual onset, constant left sided chest pain that began last night. Patient states he was "weak as water" and noticed palpitations, missing a few beats, last night. He also has associated lightheadedness, increased frequency of urination, diaphoresis and fatigue. He was started on a diuretic by a PA-C because of high BP 5 days ago and did not feel right starting 3 days ago. His regular PCP has retired. Patient denies fever.  Past Medical History  Diagnosis Date  . Hypertension   . Hypercholesterolemia   . Goiter     now on thyroid medication, s/p radioactive iodine treatment  . Carotid artery occlusion   . COPD (chronic obstructive pulmonary disease)    Past Surgical History  Procedure Laterality Date  . Colonoscopy  08/24/2004     Normal rectum,Small polyp at 25 cm, cold snared/ The remainder of the colonic mucosa appeared normal  . Rotator cuff repair  1992    left, metal implant   . Rotator cuff repair  1999    right  . Colectomy  2000    secondary to  intussuception  . Radioactive iodine for thyroid    . Back surgery  2003  . Shoulder surgery    . Multiple bilateral arm/wrist surgeries after falls    . Colonoscopy  09/24/2012    Procedure: COLONOSCOPY;  Surgeon: Daneil Dolin, MD;  Location: [facility];  Service: Endoscopy;  Laterality: N/A;  2:00 PM  . Spine surgery    . Carotid endarterectomy    . Decreased functioning of  gb     Family History  Problem Relation Age of Onset  . Colon cancer Neg Hx   . COPD Mother   . Mental illness Mother   . Hypertension Father   . Alcohol abuse Father    History  Substance Use Topics  . Smoking status: Current Some Day Smoker -- 0.20 packs/day for 30 years    Types: Cigarettes  . Smokeless tobacco: Never Used  . Alcohol Use: Yes     Comment: 6 pack in a month    Review of Systems  Constitutional: Positive for diaphoresis and fatigue. Negative for appetite change.  HENT: Negative for congestion, ear discharge and sinus pressure.   Eyes: Negative for discharge.  Respiratory: Negative for cough.   Cardiovascular: Positive for chest pain and palpitations.  Gastrointestinal: Negative for abdominal pain and diarrhea.  Genitourinary: Positive for frequency. Negative for hematuria.  Musculoskeletal: Negative for back pain.  Skin: Negative for rash.  Neurological: Positive for light-headedness. Negative for seizures and headaches.  Psychiatric/Behavioral: Negative for hallucinations.      Allergies  Lyrica  Home Medications   Prior to Admission medications   Medication Sig Start Date End Date Taking? Authorizing Provider  aspirin EC 81 MG tablet Take 81 mg by mouth daily.    Historical Provider, MD  HYDROcodone-acetaminophen (NORCO/VICODIN) 5-325 MG per tablet Take 1 tablet by mouth every 4 (four) hours as needed for pain. 05/20/13   Prentiss Bells, MD  losartan (COZAAR) 100 MG tablet Take 100 mg by mouth at bedtime.  08/28/12   Historical Provider, MD  naproxen (NAPROSYN) 500 MG  tablet Take 500 mg by mouth 2 (two) times daily with a meal.  09/10/12   Historical Provider, MD  ondansetron (ZOFRAN ODT) 4 MG disintegrating tablet Take 1 tablet (4 mg total) by mouth every 8 (eight) hours as needed for nausea. 05/20/13   Prentiss Bells, MD  rizatriptan (MAXALT-MLT) 5 MG disintegrating tablet Take 1 tablet (5 mg total) by mouth as needed for migraine. May repeat in 2 hours if needed 09/03/13   Marcial Pacas, MD  SYNTHROID 150 MCG tablet Take 150 mcg by mouth every morning.  08/18/12   Historical Provider, MD  topiramate (TOPAMAX) 100 MG tablet Take 1 tablet (100 mg total) by mouth 2 (two) times daily. 09/03/13   Marcial Pacas, MD   BP 120/68  Pulse 83  Temp(Src) 97.9 F (36.6 C) (Oral)  Resp 20  Ht 6' (1.829 m)  Wt 208 lb (94.348 kg)  BMI 28.20 kg/m2  SpO2 97% Physical Exam  Nursing note and vitals reviewed. Constitutional: He is oriented to person, place, and time. He appears well-developed.  HENT:  Head: Normocephalic.  Mouth/Throat: Mucous membranes are dry.  Eyes: Conjunctivae and EOM are normal. No scleral icterus.  Neck: Neck supple. No thyromegaly present.  Cardiovascular: Normal rate and regular rhythm.  Exam reveals no gallop and no friction rub.   No murmur heard. Pulmonary/Chest: No stridor. He has no wheezes. He has no rales. He exhibits no tenderness.  Abdominal: He exhibits no distension. There is no tenderness. There is no rebound.  Musculoskeletal: Normal range of motion. He exhibits no edema.  Lymphadenopathy:    He has no cervical adenopathy.  Neurological: He is oriented to person, place, and time. He exhibits normal muscle tone. Coordination normal.  Skin: No rash noted. No erythema.  Psychiatric: He has a normal mood and affect. His behavior is normal.    ED Course  Procedures  DIAGNOSTIC STUDIES: Oxygen Saturation is 97% on RA, nml by my interpretation.    COORDINATION OF CARE: 3:29 PM-Discussed treatment plan which includes CXR and labs with pt  at bedside and pt agreed to plan.   Labs Review Labs Reviewed - No data to display  Imaging Review No results found.   EKG Interpretation   Date/Time:  Thursday August 27 2014 15:01:26 EDT Ventricular Rate:  83 PR Interval:  160 QRS Duration: 78 QT Interval:  352 QTC Calculation: 413 R Axis:   88 Text Interpretation:  Normal sinus rhythm Septal infarct , age  undetermined Abnormal ECG Confirmed by Desera Graffeo  MD, Homer Miller [phone]) on  08/27/2014 3:20:52 PM      MDM   Final diagnoses:  None   Dehydration,    The chart was scribed for me under my direct supervision.  I personally performed the history, physical, and medical decision making and all  procedures in the evaluation of this patient.. The chart was scribed for me under my direct supervision.  I personally performed the history, physical, and medical decision making and all procedures in the evaluation of this patient.Maudry Diego, MD 08/29/14 [phone]

## 2014-08-27 NOTE — ED Notes (Signed)
Feels heart is skipping and feels tight in chest , No N/v,  Has been sweating.  ,  Sl sob

## 2014-08-28 ENCOUNTER — Encounter (HOSPITAL_COMMUNITY): Payer: Self-pay | Admitting: Emergency Medicine

## 2014-08-28 ENCOUNTER — Emergency Department (HOSPITAL_COMMUNITY)
Admission: EM | Admit: 2014-08-28 | Discharge: 2014-08-28 | Disposition: A | Discharge status: Home / Self Care | Payer: Medicare Other | Attending: Emergency Medicine | Admitting: Emergency Medicine

## 2014-08-28 ENCOUNTER — Emergency Department (HOSPITAL_COMMUNITY): Payer: Medicare Other

## 2014-08-28 DIAGNOSIS — J449 Chronic obstructive pulmonary disease, unspecified: Secondary | ICD-10-CM | POA: Diagnosis not present

## 2014-08-28 DIAGNOSIS — Z7951 Long term (current) use of inhaled steroids: Secondary | ICD-10-CM | POA: Insufficient documentation

## 2014-08-28 DIAGNOSIS — Z79899 Other long term (current) drug therapy: Secondary | ICD-10-CM | POA: Insufficient documentation

## 2014-08-28 DIAGNOSIS — E860 Dehydration: Secondary | ICD-10-CM | POA: Diagnosis not present

## 2014-08-28 DIAGNOSIS — R109 Unspecified abdominal pain: Secondary | ICD-10-CM

## 2014-08-28 DIAGNOSIS — Z720 Tobacco use: Secondary | ICD-10-CM | POA: Insufficient documentation

## 2014-08-28 DIAGNOSIS — N23 Unspecified renal colic: Secondary | ICD-10-CM

## 2014-08-28 DIAGNOSIS — I10 Essential (primary) hypertension: Secondary | ICD-10-CM | POA: Diagnosis not present

## 2014-08-28 DIAGNOSIS — Z7982 Long term (current) use of aspirin: Secondary | ICD-10-CM | POA: Insufficient documentation

## 2014-08-28 DIAGNOSIS — E049 Nontoxic goiter, unspecified: Secondary | ICD-10-CM | POA: Insufficient documentation

## 2014-08-28 LAB — URINE MICROSCOPIC-ADD ON

## 2014-08-28 LAB — URINALYSIS, ROUTINE W REFLEX MICROSCOPIC
GLUCOSE, UA: NEGATIVE mg/dL
KETONES UR: NEGATIVE mg/dL
Leukocytes, UA: NEGATIVE
Nitrite: NEGATIVE
PROTEIN: 30 mg/dL — AB
Specific Gravity, Urine: 1.025 (ref 1.005–1.030)
UROBILINOGEN UA: 0.2 mg/dL (ref 0.0–1.0)
pH: 5.5 (ref 5.0–8.0)

## 2014-08-28 MED ORDER — HYDROMORPHONE HCL 1 MG/ML IJ SOLN
1.0000 mg | Freq: Once | INTRAMUSCULAR | Status: AC
  Administered 2014-08-28: 1 mg via INTRAVENOUS
  Filled 2014-08-28: qty 1

## 2014-08-28 MED ORDER — HYDROMORPHONE HCL 4 MG PO TABS: 4.0000 mg | ORAL_TABLET | ORAL | Status: DC | PRN

## 2014-08-28 MED ORDER — TAMSULOSIN HCL 0.4 MG PO CAPS: ORAL_CAPSULE | ORAL | Status: DC

## 2014-08-28 MED ORDER — TAMSULOSIN HCL 0.4 MG PO CAPS
0.4000 mg | ORAL_CAPSULE | Freq: Once | ORAL | Status: AC
  Administered 2014-08-28: 0.4 mg via ORAL
  Filled 2014-08-28: qty 1

## 2014-08-28 MED ORDER — SODIUM CHLORIDE 0.9 % IV SOLN
INTRAVENOUS | Status: DC
  Administered 2014-08-28: 1000 mL via INTRAVENOUS

## 2014-08-28 MED ORDER — ONDANSETRON HCL 4 MG/2ML IJ SOLN
4.0000 mg | Freq: Once | INTRAMUSCULAR | Status: AC
  Administered 2014-08-28: 4 mg via INTRAVENOUS
  Filled 2014-08-28: qty 2

## 2014-08-28 NOTE — ED Provider Notes (Signed)
CSN: [id number]     Arrival date & time 08/28/14  3235 History   First MD Initiated Contact with Patient 08/28/14 0406     Chief Complaint  Patient presents with  . Flank Pain     (Consider location/radiation/quality/duration/timing/severity/associated sxs/prior Treatment) HPI This is a 63 year old male who was seen here yesterday and treated for dehydration. He is here this morning due to the sudden onset of left flank pain at about 1:30 AM. The pain radiates to the left lower quadrant and is now more prominent in the left lower quadrant. There is little change in pain with movement. He denies nausea or vomiting. He has hematuria. He characterizes the pain as like previous kidney stones.  Past Medical History  Diagnosis Date  . Hypertension   . Hypercholesterolemia   . Goiter     now on thyroid medication, s/p radioactive iodine treatment  . Carotid artery occlusion   . COPD (chronic obstructive pulmonary disease)    Past Surgical History  Procedure Laterality Date  . Colonoscopy  08/24/2004     Normal rectum,Small polyp at 25 cm, cold snared/ The remainder of the colonic mucosa appeared normal  . Rotator cuff repair  1992    left, metal implant   . Rotator cuff repair  1999    right  . Colectomy  2000    secondary to intussuception  . Radioactive iodine for thyroid    . Back surgery  2003  . Shoulder surgery    . Multiple bilateral arm/wrist surgeries after falls    . Colonoscopy  09/24/2012    Procedure: COLONOSCOPY;  Surgeon: Daneil Dolin, MD;  Location: [facility];  Service: Endoscopy;  Laterality: N/A;  2:00 PM  . Spine surgery    . Carotid endarterectomy    . Decreased functioning of  gb     Family History  Problem Relation Age of Onset  . Colon cancer Neg Hx   . COPD Mother   . Mental illness Mother   . Hypertension Father   . Alcohol abuse Father    History  Substance Use Topics  . Smoking status: Current Some Day Smoker -- 0.20 packs/day for 30 years     Types: Cigarettes  . Smokeless tobacco: Never Used  . Alcohol Use: Yes     Comment: 6 pack in a month    Review of Systems  All other systems reviewed and are negative.   Allergies  Lyrica  Home Medications   Prior to Admission medications   Medication Sig Start Date End Date Taking? Authorizing Provider  aspirin EC 81 MG tablet Take 81 mg by mouth daily.   Yes Historical Provider, MD  fluticasone (FLONASE) 50 MCG/ACT nasal spray Place 2 sprays into both nostrils daily.   Yes Historical Provider, MD  hydrochlorothiazide (HYDRODIURIL) 25 MG tablet Take 25 mg by mouth daily.   Yes Historical Provider, MD  losartan (COZAAR) 100 MG tablet Take 100 mg by mouth at bedtime.  08/28/12  Yes Historical Provider, MD  rizatriptan (MAXALT-MLT) 5 MG disintegrating tablet Take 1 tablet (5 mg total) by mouth as needed for migraine. May repeat in 2 hours if needed 09/03/13  Yes Marcial Pacas, MD  SYNTHROID 150 MCG tablet Take 150 mcg by mouth every morning.  08/18/12  Yes Historical Provider, MD  topiramate (TOPAMAX) 100 MG tablet Take 100 mg by mouth 2 (two) times daily as needed (migraines).   Yes Historical Provider, MD   BP 158/80  Pulse  80  Temp(Src) 97.7 F (36.5 C) (Oral)  Resp 20  Ht 6' (1.829 m)  Wt 208 lb (94.348 kg)  BMI 28.20 kg/m2  SpO2 100%  Physical Exam General: Well-developed, well-nourished male in no acute distress; appearance consistent with age of record HENT: normocephalic; atraumatic Eyes: pupils equal, round and reactive to light; extraocular muscles intact Neck: supple Heart: regular rate and rhythm Lungs: clear to auscultation bilaterally Abdomen: soft; nondistended; minimal left lower quadrant tenderness; no masses or hepatosplenomegaly; bowel sounds present GU: No CVA tenderness Extremities: No deformity; full range of motion; pulses normal Neurologic: Awake, alert and oriented; motor function intact in all extremities and symmetric; no facial droop Skin: Warm  and dry Psychiatric: Flat affect    ED Course  Procedures (including critical care time)  MDM   Nursing notes and vitals signs, including pulse oximetry, reviewed.  Summary of this visit's results, reviewed by myself:  Labs:  Results for orders placed during the hospital encounter of 08/28/14 (from the past 24 hour(s))  URINALYSIS, ROUTINE W REFLEX MICROSCOPIC     Status: Abnormal   Collection Time    08/28/14  4:10 AM      Result Value Ref Range   Color, Urine AMBER (*) YELLOW   APPearance HAZY (*) CLEAR   Specific Gravity, Urine 1.025  1.005 - 1.030   pH 5.5  5.0 - 8.0   Glucose, UA NEGATIVE  NEGATIVE mg/dL   Hgb urine dipstick LARGE (*) NEGATIVE   Bilirubin Urine SMALL (*) NEGATIVE   Ketones, ur NEGATIVE  NEGATIVE mg/dL   Protein, ur 30 (*) NEGATIVE mg/dL   Urobilinogen, UA 0.2  0.0 - 1.0 mg/dL   Nitrite NEGATIVE  NEGATIVE   Leukocytes, UA NEGATIVE  NEGATIVE  URINE MICROSCOPIC-ADD ON     Status: Abnormal   Collection Time    08/28/14  4:10 AM      Result Value Ref Range   Squamous Epithelial / LPF FEW (*) RARE   WBC, UA 0-2  <3 WBC/hpf   RBC / HPF TOO NUMEROUS TO COUNT  <3 RBC/hpf   Bacteria, UA MANY (*) RARE    Imaging Studies: Dg Chest Portable 1 View  08/27/2014   CLINICAL DATA:  Several days of cough with sinus drainage  EXAM: PORTABLE CHEST - 1 VIEW  COMPARISON:  December 02, 2012  FINDINGS: There is evidence of underlying emphysematous change. There is no edema or consolidation. The heart size is within normal limits. Pulmonary vascularity is within normal limits. No adenopathy. There is postoperative change in the proximal left humerus.  IMPRESSION: Underlying emphysematous change.  No edema or consolidation.   Electronically Signed   By: Lowella Grip M.D.   On: 08/27/2014 15:46   Ct Renal Stone Study  08/28/2014   CLINICAL DATA:  Sudden onset of left flank pain with difficulty urinating. Personal history of renal stones. Initial encounter.  EXAM: CT  ABDOMEN AND PELVIS WITHOUT CONTRAST  TECHNIQUE: Multidetector CT imaging of the abdomen and pelvis was performed following the standard protocol without IV contrast.  COMPARISON:  CT of the abdomen and pelvis from 05/20/2013  FINDINGS: Minimal left basilar scarring is noted. Scattered coronary artery calcification is seen.  The liver and spleen are unremarkable in appearance. The gallbladder is within normal limits. The pancreas and adrenal glands are unremarkable.  There is mild left-sided hydronephrosis, with diffuse prominence of the left ureter along its entire course, and left-sided perinephric stranding and fluid. An obstructing 4 mm stone  is noted distally at the left vesicoureteral junction, along the base of the bladder.  A nonobstructing 2 mm stone is noted at the lower pole of the left kidney. The right kidney is unremarkable in appearance, aside from minimal right-sided perinephric stranding.  No free fluid is identified. The small bowel is unremarkable in appearance. Postoperative change is noted at the proximal jejunum, without evidence of obstruction. The stomach is within normal limits. No acute vascular abnormalities are seen. Mild scattered calcification is seen along the abdominal aorta.  The appendix is not well seen; there is no evidence for appendicitis. The colon is largely decompressed and is unremarkable in appearance.  The bladder is decompressed and not well assessed. A small urachal remnant is incidentally seen. The prostate remains normal in size. No inguinal lymphadenopathy is seen.  No acute osseous abnormalities are identified. Vacuum phenomenon is seen at L5-S1.  IMPRESSION: 1. Mild left-sided hydronephrosis, with diffuse prominence of the left ureter, and an obstructing 4 mm stone noted distally at the left vesicoureteral junction, along the base of the bladder. 2. Nonobstructing 2 mm stone at the lower pole of the left kidney. 3. Scattered coronary artery calcifications seen. 4.  Postoperative change at the proximal jejunum, without evidence of obstruction. 5. Mild scattered calcification along the abdominal aorta.   Electronically Signed   By: Garald Balding M.D.   On: 08/28/2014 05:49   5:57 AM Pain well controlled.   Wynetta Fines, MD 08/28/14 [phone]

## 2014-08-28 NOTE — ED Notes (Signed)
Pt states he was here earlier for dehydration, and went home feeling much better, now returns with pain to left flank with diff urinating.  Pt reports hx of previous kidney ston

## 2014-09-16 ENCOUNTER — Other Ambulatory Visit: Payer: Self-pay | Admitting: Neurology

## 2014-10-01 ENCOUNTER — Encounter: Payer: Self-pay | Admitting: *Deleted

## 2014-10-09 ENCOUNTER — Encounter: Payer: Self-pay | Admitting: Gastroenterology

## 2014-10-09 ENCOUNTER — Telehealth: Payer: Self-pay | Admitting: Internal Medicine

## 2014-10-09 ENCOUNTER — Ambulatory Visit: Payer: Medicare Other | Admitting: Gastroenterology

## 2014-10-09 NOTE — Telephone Encounter (Signed)
Patient was a no show 10/09/14  Letter sent

## 2014-11-09 ENCOUNTER — Encounter: Payer: Self-pay | Admitting: Gastroenterology

## 2014-11-09 ENCOUNTER — Other Ambulatory Visit: Payer: Self-pay

## 2014-11-09 ENCOUNTER — Ambulatory Visit (INDEPENDENT_AMBULATORY_CARE_PROVIDER_SITE_OTHER): Payer: Medicare Other | Admitting: Gastroenterology

## 2014-11-09 VITALS — BP 130/89 | HR 109 | Temp 97.1°F | Ht 72.0 in | Wt 208.4 lb

## 2014-11-09 DIAGNOSIS — R1012 Left upper quadrant pain: Secondary | ICD-10-CM

## 2014-11-09 DIAGNOSIS — K921 Melena: Secondary | ICD-10-CM

## 2014-11-09 NOTE — Assessment & Plan Note (Signed)
63 year old male with chronic, vague LUQ discomfort occasionally exacerbated by eating/drinking, with last upper GI evaluation possibly in remote past. Denies NSAIDs, aspirin powders, melena, dysphagia. Occasional rare reflux. As it has been a significant amount of time since upper GI evaluation, would pursue EGD to assess for occult gastritis, PUD. As of note, pancreatitis on CT in 2014 during ED visit with normal lipase. After EGD, would recommend CT with pancreatic protocol for further evaluation.   Proceed with upper endoscopy in the near future with Dr. Gala Romney. The risks, benefits, and alternatives have been discussed in detail with patient. They have stated understanding and desire to proceed.

## 2014-11-09 NOTE — Progress Notes (Signed)
Referring Provider: Elsie Lincoln, MD Primary Care Physician:  Leonides Grills, MD  Primary GI: Dr. Gala Romney   Chief Complaint  Patient presents with  . Hematochezia    HPI:   Seth Savage is a pleasant 63 year old male with last colonoscopy in Nov 2013 with internal hemorrhoids and colonic diverticulosis. Notes LUQ discomfort intermittently, annoying. Present for a few years. May occur a few hours after eating. Sometimes drinking fluids bother it. Notes esophageal discomfort with cold foods like milkshakes. No dysphagia. Very rare reflux, perhaps 2 times a year. No unexplained weight loss or lack of appetite. No changes in bowel habits. Very rare hematochezia, known internal hemorrhoids. Possible remote history of EGD at outside facility while on Celebrex. Denies NSAIDs, aspirin powders.   CT in July 2014 during ED visit showed mild stranding about the head of the pancreas most consistent with pancreatitis. Lipase at that time was normal.    Past Medical History  Diagnosis Date  . Hypertension   . Hypercholesterolemia   . Goiter     now on thyroid medication, s/p radioactive iodine treatment  . Carotid artery occlusion   . COPD (chronic obstructive pulmonary disease)   . Neuropathy     Past Surgical History  Procedure Laterality Date  . Colonoscopy  08/24/2004     Normal rectum,Small polyp at 25 cm, cold snared/ The remainder of the colonic mucosa appeared normal  . Rotator cuff repair  1992    left, metal implant   . Rotator cuff repair  1999    right  . Colectomy  2000    secondary to intussuception  . Radioactive iodine for thyroid    . Back surgery  2003  . Shoulder surgery    . Multiple bilateral arm/wrist surgeries after falls    . Colonoscopy  09/24/2012    Dr. Mike Craze hemorrhoids-likely source of hematochezia.Colonic diverticulosis  . Spine surgery    . Carotid endarterectomy      Current Outpatient Prescriptions  Medication Sig Dispense Refill   . aspirin EC 81 MG tablet Take 81 mg by mouth daily.    . fluticasone (FLONASE) 50 MCG/ACT nasal spray Place 2 sprays into both nostrils daily.    . hydrochlorothiazide (HYDRODIURIL) 25 MG tablet Take 25 mg by mouth daily.    Marland Kitchen losartan (COZAAR) 100 MG tablet Take 100 mg by mouth at bedtime.     . rizatriptan (MAXALT-MLT) 5 MG disintegrating tablet Take 1 tablet (5 mg total) by mouth as needed for migraine. May repeat in 2 hours if needed 15 tablet 6  . SYNTHROID 150 MCG tablet Take 150 mcg by mouth every morning.     . topiramate (TOPAMAX) 100 MG tablet Take 100 mg by mouth 2 (two) times daily as needed (migraines).     No current facility-administered medications for this visit.    Allergies as of 11/09/2014 - Review Complete 08/28/2014  Allergen Reaction Noted  . Lyrica [pregabalin] Shortness Of Breath 09/17/2012  . Codeine Rash 11/09/2014    Family History  Problem Relation Age of Onset  . Colon cancer Neg Hx   . COPD Mother   . Mental illness Mother   . Hypertension Father   . Alcohol abuse Father     History   Social History  . Marital Status: Married    Spouse Name: Margie    Number of Children: 0  . Years of Education: Ged   Occupational History  .      Disabled  Social History Main Topics  . Smoking status: Current Some Day Smoker -- 0.20 packs/day for 30 years    Types: Cigarettes  . Smokeless tobacco: Never Used  . Alcohol Use: Yes     Comment: 6 pack in a month  . Drug Use: No  . Sexual Activity: None   Other Topics Concern  . None   Social History Narrative   Patient lives at home with his wife. [facility]).    Disabled.   Right handed.   Caffeine- two cups daily.   Three step children.    Review of Systems: As mentioned in HPI.   Physical Exam: BP 130/89 mmHg  Pulse 109  Temp(Src) 97.1 F (36.2 C)  Ht 6' (1.829 m)  Wt 208 lb 6.4 oz (94.53 kg)  BMI 28.26 kg/m2 General:   Alert and oriented. No distress noted. Pleasant and cooperative.    Head:  Normocephalic and atraumatic. Eyes:  Conjuctiva clear without scleral icterus. Mouth:  Oral mucosa pink and moist. Good dentition. No lesions. Neck:  Supple, without mass or thyromegaly. Heart:  S1, S2 present without murmurs, rubs, or gallops. Regular rate and rhythm. Abdomen:  +BS, soft, non-tender and non-distended. No rebound or guarding. No HSM or masses noted. Msk:  Symmetrical without gross deformities. Normal posture. Extremities:  Without edema. Neurologic:  Alert and  oriented x4;  grossly normal neurologically. Skin:  Intact without significant lesions or rashes. Psych:  Alert and cooperative. Normal mood and affect.

## 2014-11-09 NOTE — Assessment & Plan Note (Signed)
Colonoscopy on file from 2013 as above. Very rare; likely secondary to known internal hemorrhoids. Discussed risks/benefits of outpatient banding procedure. He would like to observe for now and pursue if any change in status.

## 2014-11-09 NOTE — Patient Instructions (Signed)
We have scheduled you for an upper endoscopy with Dr. Gala Romney in the near future.  Further recommendations to follow.  Have a Merry Christmas!

## 2014-11-18 NOTE — Progress Notes (Signed)
cc'ed to pcp °

## 2014-11-20 DIAGNOSIS — K279 Peptic ulcer, site unspecified, unspecified as acute or chronic, without hemorrhage or perforation: Secondary | ICD-10-CM

## 2014-11-20 DIAGNOSIS — B9681 Helicobacter pylori [H. pylori] as the cause of diseases classified elsewhere: Secondary | ICD-10-CM

## 2014-11-20 HISTORY — DX: Helicobacter pylori (H. pylori) as the cause of diseases classified elsewhere: B96.81

## 2014-11-20 HISTORY — DX: Peptic ulcer, site unspecified, unspecified as acute or chronic, without hemorrhage or perforation: K27.9

## 2014-12-02 ENCOUNTER — Telehealth: Payer: Self-pay

## 2014-12-02 NOTE — Telephone Encounter (Signed)
Spoke with pts wife and pt is willing to change appt of procedure from 12/07/2014 to 12/09/2014.  Pt aware of new date and time

## 2014-12-09 ENCOUNTER — Encounter (HOSPITAL_COMMUNITY)
Admission: RE | Disposition: A | Discharge status: Home Health | Payer: Self-pay | Source: Ambulatory Visit | Attending: Internal Medicine

## 2014-12-09 ENCOUNTER — Encounter (HOSPITAL_COMMUNITY): Payer: Self-pay | Admitting: *Deleted

## 2014-12-09 ENCOUNTER — Ambulatory Visit (HOSPITAL_COMMUNITY)
Admission: RE | Admit: 2014-12-09 | Discharge: 2014-12-09 | Disposition: A | Discharge status: Home Health | Payer: Medicare Other | Source: Ambulatory Visit | Attending: Internal Medicine | Admitting: Internal Medicine

## 2014-12-09 DIAGNOSIS — K269 Duodenal ulcer, unspecified as acute or chronic, without hemorrhage or perforation: Secondary | ICD-10-CM | POA: Insufficient documentation

## 2014-12-09 DIAGNOSIS — B9681 Helicobacter pylori [H. pylori] as the cause of diseases classified elsewhere: Secondary | ICD-10-CM | POA: Diagnosis not present

## 2014-12-09 DIAGNOSIS — R1012 Left upper quadrant pain: Secondary | ICD-10-CM

## 2014-12-09 DIAGNOSIS — J449 Chronic obstructive pulmonary disease, unspecified: Secondary | ICD-10-CM | POA: Diagnosis not present

## 2014-12-09 DIAGNOSIS — Z7982 Long term (current) use of aspirin: Secondary | ICD-10-CM | POA: Insufficient documentation

## 2014-12-09 DIAGNOSIS — Z8719 Personal history of other diseases of the digestive system: Secondary | ICD-10-CM | POA: Insufficient documentation

## 2014-12-09 DIAGNOSIS — K2100 Gastro-esophageal reflux disease with esophagitis, without bleeding: Secondary | ICD-10-CM | POA: Insufficient documentation

## 2014-12-09 DIAGNOSIS — I10 Essential (primary) hypertension: Secondary | ICD-10-CM | POA: Insufficient documentation

## 2014-12-09 DIAGNOSIS — K2940 Chronic atrophic gastritis without bleeding: Secondary | ICD-10-CM | POA: Diagnosis not present

## 2014-12-09 DIAGNOSIS — K31819 Angiodysplasia of stomach and duodenum without bleeding: Secondary | ICD-10-CM | POA: Insufficient documentation

## 2014-12-09 DIAGNOSIS — Z800 Family history of malignant neoplasm of digestive organs: Secondary | ICD-10-CM | POA: Diagnosis not present

## 2014-12-09 DIAGNOSIS — E049 Nontoxic goiter, unspecified: Secondary | ICD-10-CM | POA: Diagnosis not present

## 2014-12-09 DIAGNOSIS — K253 Acute gastric ulcer without hemorrhage or perforation: Secondary | ICD-10-CM | POA: Diagnosis not present

## 2014-12-09 DIAGNOSIS — Z885 Allergy status to narcotic agent status: Secondary | ICD-10-CM | POA: Diagnosis not present

## 2014-12-09 DIAGNOSIS — K2950 Unspecified chronic gastritis without bleeding: Secondary | ICD-10-CM | POA: Diagnosis not present

## 2014-12-09 DIAGNOSIS — K259 Gastric ulcer, unspecified as acute or chronic, without hemorrhage or perforation: Secondary | ICD-10-CM | POA: Insufficient documentation

## 2014-12-09 DIAGNOSIS — F17210 Nicotine dependence, cigarettes, uncomplicated: Secondary | ICD-10-CM | POA: Insufficient documentation

## 2014-12-09 DIAGNOSIS — K449 Diaphragmatic hernia without obstruction or gangrene: Secondary | ICD-10-CM | POA: Diagnosis not present

## 2014-12-09 DIAGNOSIS — E780 Pure hypercholesterolemia: Secondary | ICD-10-CM | POA: Insufficient documentation

## 2014-12-09 HISTORY — PX: ESOPHAGOGASTRODUODENOSCOPY: SHX5428

## 2014-12-09 SURGERY — EGD (ESOPHAGOGASTRODUODENOSCOPY)
Anesthesia: Moderate Sedation

## 2014-12-09 MED ORDER — LIDOCAINE VISCOUS 2 % MT SOLN
OROMUCOSAL | Status: AC
  Filled 2014-12-09: qty 15

## 2014-12-09 MED ORDER — MIDAZOLAM HCL 5 MG/5ML IJ SOLN
INTRAMUSCULAR | Status: DC | PRN
  Administered 2014-12-09 (×2): 2 mg via INTRAVENOUS

## 2014-12-09 MED ORDER — ONDANSETRON HCL 4 MG/2ML IJ SOLN
INTRAMUSCULAR | Status: DC | PRN
  Administered 2014-12-09: 4 mg via INTRAVENOUS

## 2014-12-09 MED ORDER — SODIUM CHLORIDE 0.9 % IV SOLN
INTRAVENOUS | Status: DC
  Administered 2014-12-09: 14:00:00 via INTRAVENOUS

## 2014-12-09 MED ORDER — MIDAZOLAM HCL 5 MG/5ML IJ SOLN
INTRAMUSCULAR | Status: AC
  Filled 2014-12-09: qty 10

## 2014-12-09 MED ORDER — LIDOCAINE VISCOUS 2 % MT SOLN
OROMUCOSAL | Status: DC | PRN
  Administered 2014-12-09: 4 mL via OROMUCOSAL

## 2014-12-09 MED ORDER — MEPERIDINE HCL 100 MG/ML IJ SOLN
INTRAMUSCULAR | Status: DC | PRN
  Administered 2014-12-09 (×2): 50 mg via INTRAVENOUS

## 2014-12-09 MED ORDER — MEPERIDINE HCL 100 MG/ML IJ SOLN
INTRAMUSCULAR | Status: AC
  Filled 2014-12-09: qty 2

## 2014-12-09 MED ORDER — ONDANSETRON HCL 4 MG/2ML IJ SOLN
INTRAMUSCULAR | Status: AC
  Filled 2014-12-09: qty 2

## 2014-12-09 MED ORDER — STERILE WATER FOR IRRIGATION IR SOLN
Status: DC | PRN
  Administered 2014-12-09: 15:00:00

## 2014-12-09 NOTE — Interval H&P Note (Signed)
History and Physical Interval Note:  12/09/2014 3:09 PM  Seth Savage  has presented today for surgery, with the diagnosis of LUQ pain  The various methods of treatment have been discussed with the patient and family. After consideration of risks, benefits and other options for treatment, the patient has consented to  Procedure(s) with comments: ESOPHAGOGASTRODUODENOSCOPY (EGD) (N/A) - 1000 - moved to 1/20 @ 1:1510:30 - Candy to notify pt as a surgical intervention .  The patient's history has been reviewed, patient examined, no change in status, stable for surgery.  I have reviewed the patient's chart and labs.  Questions were answered to the patient's satisfaction.     Estelle Skibicki  No change. Diagnostic EGD per plan.The risks, benefits, limitations, alternatives and imponderables have been reviewed with the patient. Potential for esophageal dilation, biopsy, etc. have also been reviewed.  Questions have been answered. All parties agreeable.

## 2014-12-09 NOTE — Op Note (Signed)
[facility] [address] [place], [address]   ENDOSCOPY PROCEDURE REPORT  PATIENT: Seth Savage, Seth Savage  MR#: [id number] BIRTHDATE: 1950-12-04 , 49  yrs. old GENDER: male ENDOSCOPIST: R.  Garfield Cornea, MD FACP FACG REFERRED BY:  Elsie Lincoln, M.D. PROCEDURE DATE:  01-05-15 PROCEDURE:  EGD w/ biopsy INDICATIONS:  left upper quadrant abdominal pain.  ; patient denies NSAID use MEDICATIONS: Versed 4 mg IV and Demerol 100 mg IV in divided doses. Zofran 4 mg IV.  Xylocaine gel orally ASA CLASS:      Class II  CONSENT: The risks, benefits, limitations, alternatives and imponderables have been discussed.  The potential for biopsy, esophogeal dilation, etc. have also been reviewed.  Questions have been answered.  All parties agreeable.  Please see the history and physical in the medical record for more information.  DESCRIPTION OF PROCEDURE: After the risks benefits and alternatives of the procedure were thoroughly explained, informed consent was obtained.  The EG-[id number] ([id number]) endoscope was introduced through the mouth and advanced to the second portion of the duodenum , limited by Without limitations. The instrument was slowly withdrawn as the mucosa was fully examined.    Diistal esophageal erosions within 3 mm of the GE junction involving about one third of the circumference of the esophagus.  No Barrett's esophagus.  Stomach empty.  Patient had approximately (3) 5 mm prepyloric gastric ulcers with satellite erosions.  No obvious infiltrating process.  Patent pylorus.  Examination of bulb revealed erosions but no ulcer ; there was a single 3 mm nonbleeding AVM, otherwise, the duodenum through second portion appeared unremarkable.  Biopsies of the gastric ulcers taken for histologic study. Retroflexed views revealed as previously described.     The scope was then withdrawn from the patient and the procedure completed.  COMPLICATIONS: There were no  immediate complications.  ENDOSCOPIC IMPRESSION: Erosive reflux esophagitis. Peptic ulcer disease as described. Small hiatal hernia. Nonbleeding duodenal AVM. Duodenal erosions. Status post gastric ulcer biopsy.  RECOMMENDATIONS: Begin Protonix 40 mg twice daily. Avoid all nonsteroidal agents. Follow up on pathology. Office visit with [place] in about 10 weeks.  REPEAT EXAM:  eSigned:  R. Garfield Cornea, MD Rosalita Chessman [facility] Jan 05, 2015 3:41 PM    CC:  CPT CODES: ICD CODES:  The ICD and CPT codes recommended by this software are interpretations from the data that the clinical staff has captured with the software.  The verification of the translation of this report to the ICD and CPT codes and modifiers is the sole responsibility of the health care institution and practicing physician where this report was generated.  [employer] will not be held responsible for the validity of the ICD and CPT codes included on this report.  AMA assumes no liability for data contained or not contained herein. CPT is a Designer, television/film set of the [employer].  PATIENT NAME:  Seth Savage, Seth Savage MR#: [id number]

## 2014-12-09 NOTE — H&P (View-Only) (Signed)
Referring Provider: Elsie Lincoln, MD Primary Care Physician:  Leonides Grills, MD  Primary GI: Dr. Gala Romney   Chief Complaint  Patient presents with  . Hematochezia    HPI:   EDKER PUNT is a pleasant 64 year old male with last colonoscopy in Nov 2013 with internal hemorrhoids and colonic diverticulosis. Notes LUQ discomfort intermittently, annoying. Present for a few years. May occur a few hours after eating. Sometimes drinking fluids bother it. Notes esophageal discomfort with cold foods like milkshakes. No dysphagia. Very rare reflux, perhaps 2 times a year. No unexplained weight loss or lack of appetite. No changes in bowel habits. Very rare hematochezia, known internal hemorrhoids. Possible remote history of EGD at outside facility while on Celebrex. Denies NSAIDs, aspirin powders.   CT in July 2014 during ED visit showed mild stranding about the head of the pancreas most consistent with pancreatitis. Lipase at that time was normal.    Past Medical History  Diagnosis Date  . Hypertension   . Hypercholesterolemia   . Goiter     now on thyroid medication, s/p radioactive iodine treatment  . Carotid artery occlusion   . COPD (chronic obstructive pulmonary disease)   . Neuropathy     Past Surgical History  Procedure Laterality Date  . Colonoscopy  08/24/2004     Normal rectum,Small polyp at 25 cm, cold snared/ The remainder of the colonic mucosa appeared normal  . Rotator cuff repair  1992    left, metal implant   . Rotator cuff repair  1999    right  . Colectomy  2000    secondary to intussuception  . Radioactive iodine for thyroid    . Back surgery  2003  . Shoulder surgery    . Multiple bilateral arm/wrist surgeries after falls    . Colonoscopy  09/24/2012    Dr. Mike Craze hemorrhoids-likely source of hematochezia.Colonic diverticulosis  . Spine surgery    . Carotid endarterectomy      Current Outpatient Prescriptions  Medication Sig Dispense Refill   . aspirin EC 81 MG tablet Take 81 mg by mouth daily.    . fluticasone (FLONASE) 50 MCG/ACT nasal spray Place 2 sprays into both nostrils daily.    . hydrochlorothiazide (HYDRODIURIL) 25 MG tablet Take 25 mg by mouth daily.    Marland Kitchen losartan (COZAAR) 100 MG tablet Take 100 mg by mouth at bedtime.     . rizatriptan (MAXALT-MLT) 5 MG disintegrating tablet Take 1 tablet (5 mg total) by mouth as needed for migraine. May repeat in 2 hours if needed 15 tablet 6  . SYNTHROID 150 MCG tablet Take 150 mcg by mouth every morning.     . topiramate (TOPAMAX) 100 MG tablet Take 100 mg by mouth 2 (two) times daily as needed (migraines).     No current facility-administered medications for this visit.    Allergies as of 11/09/2014 - Review Complete 08/28/2014  Allergen Reaction Noted  . Lyrica [pregabalin] Shortness Of Breath 09/17/2012  . Codeine Rash 11/09/2014    Family History  Problem Relation Age of Onset  . Colon cancer Neg Hx   . COPD Mother   . Mental illness Mother   . Hypertension Father   . Alcohol abuse Father     History   Social History  . Marital Status: Married    Spouse Name: Margie    Number of Children: 0  . Years of Education: Ged   Occupational History  .      Disabled  Social History Main Topics  . Smoking status: Current Some Day Smoker -- 0.20 packs/day for 30 years    Types: Cigarettes  . Smokeless tobacco: Never Used  . Alcohol Use: Yes     Comment: 6 pack in a month  . Drug Use: No  . Sexual Activity: None   Other Topics Concern  . None   Social History Narrative   Patient lives at home with his wife. [facility]).    Disabled.   Right handed.   Caffeine- two cups daily.   Three step children.    Review of Systems: As mentioned in HPI.   Physical Exam: BP 130/89 mmHg  Pulse 109  Temp(Src) 97.1 F (36.2 C)  Ht 6' (1.829 m)  Wt 208 lb 6.4 oz (94.53 kg)  BMI 28.26 kg/m2 General:   Alert and oriented. No distress noted. Pleasant and cooperative.    Head:  Normocephalic and atraumatic. Eyes:  Conjuctiva clear without scleral icterus. Mouth:  Oral mucosa pink and moist. Good dentition. No lesions. Neck:  Supple, without mass or thyromegaly. Heart:  S1, S2 present without murmurs, rubs, or gallops. Regular rate and rhythm. Abdomen:  +BS, soft, non-tender and non-distended. No rebound or guarding. No HSM or masses noted. Msk:  Symmetrical without gross deformities. Normal posture. Extremities:  Without edema. Neurologic:  Alert and  oriented x4;  grossly normal neurologically. Skin:  Intact without significant lesions or rashes. Psych:  Alert and cooperative. Normal mood and affect.

## 2014-12-09 NOTE — Discharge Instructions (Signed)
EGD Discharge instructions Please read the instructions outlined below and refer to this sheet in the next few weeks. These discharge instructions provide you with general information on caring for yourself after you leave the hospital. Your doctor may also give you specific instructions. While your treatment has been planned according to the most current medical practices available, unavoidable complications occasionally occur. If you have any problems or questions after discharge, please call your doctor. ACTIVITY  You may resume your regular activity but move at a slower pace for the next 24 hours.   Take frequent rest periods for the next 24 hours.   Walking will help expel (get rid of) the air and reduce the bloated feeling in your abdomen.   No driving for 24 hours (because of the anesthesia (medicine) used during the test).   You may shower.   Do not sign any important legal documents or operate any machinery for 24 hours (because of the anesthesia used during the test).  NUTRITION  Drink plenty of fluids.   You may resume your normal diet.   Begin with a light meal and progress to your normal diet.   Avoid alcoholic beverages for 24 hours or as instructed by your caregiver.  MEDICATIONS  You may resume your normal medications unless your caregiver tells you otherwise.  WHAT YOU CAN EXPECT TODAY  You may experience abdominal discomfort such as a feeling of fullness or gas pains.  FOLLOW-UP  Your doctor will discuss the results of your test with you.  SEEK IMMEDIATE MEDICAL ATTENTION IF ANY OF THE FOLLOWING OCCUR:  Excessive nausea (feeling sick to your stomach) and/or vomiting.   Severe abdominal pain and distention (swelling).   Trouble swallowing.   Temperature over 101 F (37.8 C).   Rectal bleeding or vomiting of blood.    GERD and peptic ulcer disease information provided  Begin Protonix 40 mg twice daily  Avoid all nonsteroidal medications like  aspirin or Advil, ibuprofen, Aleve etc.  Gastroesophageal Reflux Disease, Adult Gastroesophageal reflux disease (GERD) happens when acid from your stomach flows up into the esophagus. When acid comes in contact with the esophagus, the acid causes soreness (inflammation) in the esophagus. Over time, GERD may create small holes (ulcers) in the lining of the esophagus. CAUSES   Increased body weight. This puts pressure on the stomach, making acid rise from the stomach into the esophagus.  Smoking. This increases acid production in the stomach.  Drinking alcohol. This causes decreased pressure in the lower esophageal sphincter (valve or ring of muscle between the esophagus and stomach), allowing acid from the stomach into the esophagus.  Late evening meals and a full stomach. This increases pressure and acid production in the stomach.  A malformed lower esophageal sphincter. Sometimes, no cause is found. SYMPTOMS   Burning pain in the lower part of the mid-chest behind the breastbone and in the mid-stomach area. This may occur twice a week or more often.  Trouble swallowing.  Sore throat.  Dry cough.  Asthma-like symptoms including chest tightness, shortness of breath, or wheezing. DIAGNOSIS  Your caregiver may be able to diagnose GERD based on your symptoms. In some cases, X-rays and other tests may be done to check for complications or to check the condition of your stomach and esophagus. TREATMENT  Your caregiver may recommend over-the-counter or prescription medicines to help decrease acid production. Ask your caregiver before starting or adding any new medicines.  HOME CARE INSTRUCTIONS   Change the factors that  you can control. Ask your caregiver for guidance concerning weight loss, quitting smoking, and alcohol consumption.  Avoid foods and drinks that make your symptoms worse, such as:  Caffeine or alcoholic drinks.  Chocolate.  Peppermint or mint flavorings.  Garlic  and onions.  Spicy foods.  Citrus fruits, such as oranges, lemons, or limes.  Tomato-based foods such as sauce, chili, salsa, and pizza.  Fried and fatty foods.  Avoid lying down for the 3 hours prior to your bedtime or prior to taking a nap.  Eat small, frequent meals instead of large meals.  Wear loose-fitting clothing. Do not wear anything tight around your waist that causes pressure on your stomach.  Raise the head of your bed 6 to 8 inches with wood blocks to help you sleep. Extra pillows will not help.  Only take over-the-counter or prescription medicines for pain, discomfort, or fever as directed by your caregiver.  Do not take aspirin, ibuprofen, or other nonsteroidal anti-inflammatory drugs (NSAIDs). SEEK IMMEDIATE MEDICAL CARE IF:   You have pain in your arms, neck, jaw, teeth, or back.  Your pain increases or changes in intensity or duration.  You develop nausea, vomiting, or sweating (diaphoresis).  You develop shortness of breath, or you faint.  Your vomit is green, yellow, black, or looks like coffee grounds or blood.  Your stool is red, bloody, or black. These symptoms could be signs of other problems, such as heart disease, gastric bleeding, or esophageal bleeding. MAKE SURE YOU:   Understand these instructions.  Will watch your condition.  Will get help right away if you are not doing well or get worse. Further recommendations to follow pending review of pathology report  Office visit with [place] in 10 weeks.  Thursday,April 7 at 8:30AM  Peptic Ulcer A peptic ulcer is a sore in the lining of your esophagus (esophageal ulcer), stomach (gastric ulcer), or in the first part of your small intestine (duodenal ulcer). The ulcer causes erosion into the deeper tissue. CAUSES  Normally, the lining of the stomach and the small intestine protects itself from the acid that digests food. The protective lining can be damaged by:  An infection caused by a bacterium  called Helicobacter pylori (H. pylori).  Regular use of nonsteroidal anti-inflammatory drugs (NSAIDs), such as ibuprofen or aspirin.  Smoking tobacco. Other risk factors include being older than 16, drinking alcohol excessively, and having a family history of ulcer disease.  SYMPTOMS   Burning pain or gnawing in the area between the chest and the belly button.  Heartburn.  Nausea and vomiting.  Bloating. The pain can be worse on an empty stomach and at night. If the ulcer results in bleeding, it can cause:  Black, tarry stools.  Vomiting of bright red blood.  Vomiting of coffee-ground-looking materials. DIAGNOSIS  A diagnosis is usually made based upon your history and an exam. Other tests and procedures may be performed to find the cause of the ulcer. Finding a cause will help determine the best treatment. Tests and procedures may include:  Blood tests, stool tests, or breath tests to check for the bacterium H. pylori.  An upper gastrointestinal (GI) series of the esophagus, stomach, and small intestine.  An endoscopy to examine the esophagus, stomach, and small intestine.  A biopsy. TREATMENT  Treatment may include:  Eliminating the cause of the ulcer, such as smoking, NSAIDs, or alcohol.  Medicines to reduce the amount of acid in your digestive tract.  Antibiotic medicines if the ulcer is  caused by the H. pylori bacterium.  An upper endoscopy to treat a bleeding ulcer.  Surgery if the bleeding is severe or if the ulcer created a hole somewhere in the digestive system. HOME CARE INSTRUCTIONS   Avoid tobacco, alcohol, and caffeine. Smoking can increase the acid in the stomach, and continued smoking will impair the healing of ulcers.  Avoid foods and drinks that seem to cause discomfort or aggravate your ulcer.  Only take medicines as directed by your caregiver. Do not substitute over-the-counter medicines for prescription medicines without talking to your  caregiver.  Keep any follow-up appointments and tests as directed. SEEK MEDICAL CARE IF:   Your do not improve within 7 days of starting treatment.  You have ongoing indigestion or heartburn. SEEK IMMEDIATE MEDICAL CARE IF:   You have sudden, sharp, or persistent abdominal pain.  You have bloody or dark black, tarry stools.  You vomit blood or vomit that looks like coffee grounds.  You become light-headed, weak, or feel faint.  You become sweaty or clammy. MAKE SURE YOU:   Understand these instructions.  Will watch your condition.  Will get help right away if you are not doing well or get worse. Document Released: 11/03/2000 Document Revised: 03/23/2014 Document Reviewed: 06/05/2012 [facility] Patient Information 2015 [place], [place]. This information is not intended to replace advice given to you by your health care provider. Make sure you discuss any questions you have with your health care provider.

## 2014-12-10 ENCOUNTER — Encounter (HOSPITAL_COMMUNITY): Payer: Self-pay | Admitting: Internal Medicine

## 2014-12-14 ENCOUNTER — Telehealth: Payer: Self-pay

## 2014-12-14 ENCOUNTER — Encounter: Payer: Self-pay | Admitting: Internal Medicine

## 2014-12-14 ENCOUNTER — Encounter: Payer: Self-pay | Admitting: Family

## 2014-12-14 ENCOUNTER — Encounter: Payer: Self-pay | Admitting: Vascular Surgery

## 2014-12-14 NOTE — Telephone Encounter (Signed)
Per RMR-  Send letter to patient.  Send copy of letter with path to referring provider and PCP.  [name]: Patient needs PrevPak or generic equivalent x 14 days--hold any acid suppression and/or statin therapy patient may be already taking during treatment- then resume afterwards

## 2014-12-15 ENCOUNTER — Ambulatory Visit (INDEPENDENT_AMBULATORY_CARE_PROVIDER_SITE_OTHER): Payer: Medicare Other | Admitting: Vascular Surgery

## 2014-12-15 ENCOUNTER — Ambulatory Visit (HOSPITAL_COMMUNITY)
Admission: RE | Admit: 2014-12-15 | Discharge: 2014-12-15 | Disposition: A | Discharge status: Home / Self Care | Payer: Medicare Other | Source: Ambulatory Visit | Attending: Vascular Surgery | Admitting: Vascular Surgery

## 2014-12-15 ENCOUNTER — Encounter: Payer: Self-pay | Admitting: Vascular Surgery

## 2014-12-15 VITALS — BP 149/132 | HR 84 | Ht 72.0 in | Wt 212.0 lb

## 2014-12-15 DIAGNOSIS — I6523 Occlusion and stenosis of bilateral carotid arteries: Secondary | ICD-10-CM

## 2014-12-15 DIAGNOSIS — Z48812 Encounter for surgical aftercare following surgery on the circulatory system: Secondary | ICD-10-CM | POA: Diagnosis not present

## 2014-12-15 NOTE — Addendum Note (Signed)
Addended by: Mena Goes on: 12/15/2014 04:54 PM   Modules accepted: Orders

## 2014-12-15 NOTE — Progress Notes (Addendum)
HISTORY AND PHYSICAL     CC:  Follow up carotid duplex scan  Referring Provider:  Elsie Lincoln, MD  HPI: This is a 64 y.o. male who has known carotid stenosis is here for f/u carotid duplex scan.  Denies amaurosis fugax, paresthesias, or hemiparesis.  He states that in 2005, he did have temporary blindness in half his visual field in the right eye that resolved within an hour.  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   He states that he has numbness in both arms if he rests with his hands behind his head.  This improves after putting his arms down.  He states that he had a traumatic injury to his right arm/hand and had significant nerve damage that has improved over time.  He states that he has palpitations "once in a blue moon".  He states these have been evaluated by his PCP.  He states that he was recently found to have a stomach ulcer and was placed on medication bid.    Past Medical History  Diagnosis Date  . Hypertension   . Hypercholesterolemia   . Goiter     now on thyroid medication, s/p radioactive iodine treatment  . Carotid artery occlusion   . COPD (chronic obstructive pulmonary disease)   . Neuropathy     Past Surgical History  Procedure Laterality Date  . Colonoscopy  08/24/2004     Normal rectum,Small polyp at 25 cm, cold snared/ The remainder of the colonic mucosa appeared normal  . Rotator cuff repair  1992    left, metal implant   . Rotator cuff repair  1999    right  . Colectomy  2000    secondary to intussuception  . Radioactive iodine for thyroid    . Back surgery  2003  . Shoulder surgery    . Multiple bilateral arm/wrist surgeries after falls    . Colonoscopy  09/24/2012    Dr. Mike Craze hemorrhoids-likely source of hematochezia.Colonic diverticulosis  . Spine surgery    . Carotid endarterectomy    . Esophagogastroduodenoscopy N/A 12/09/2014    Procedure: ESOPHAGOGASTRODUODENOSCOPY (EGD);  Surgeon:  Daneil Dolin, MD;  Location: [facility];  Service: Endoscopy;  Laterality: N/A;  1000 - moved to 1/20 @ 1:1510:30 - Candy to notify pt    Allergies  Allergen Reactions  . Lyrica [Pregabalin] Shortness Of Breath  . Codeine Rash    Current Outpatient Prescriptions  Medication Sig Dispense Refill  . aspirin EC 81 MG tablet Take 81 mg by mouth daily.    . fluticasone (FLONASE) 50 MCG/ACT nasal spray Place 2 sprays into both nostrils daily.    . hydrochlorothiazide (HYDRODIURIL) 25 MG tablet Take 12.5 mg by mouth daily.     Marland Kitchen losartan (COZAAR) 100 MG tablet Take 100 mg by mouth at bedtime.     . rizatriptan (MAXALT-MLT) 5 MG disintegrating tablet Take 1 tablet (5 mg total) by mouth as needed for migraine. May repeat in 2 hours if needed 15 tablet 6  . SYNTHROID 150 MCG tablet Take 150 mcg by mouth every morning.     . topiramate (TOPAMAX) 100 MG tablet Take 100 mg by mouth 2 (two) times daily as needed (migraines).     No current facility-administered medications for this visit.    Pt's meds include: Statin:  No. Beta Blocker:  No. Aspirin:  Yes.   Other antiplatelets/anticoagulants:  No.    Family  History  Problem Relation Age of Onset  . Colon cancer Neg Hx   . COPD Mother   . Mental illness Mother   . Hypertension Mother   . Varicose Veins Mother   . Heart attack Mother   . Hypertension Father   . Alcohol abuse Father     History   Social History  . Marital Status: Married    Spouse Name: Margie    Number of Children: 0  . Years of Education: Ged   Occupational History  .      Disabled   Social History Main Topics  . Smoking status: Current Every Day Smoker -- 0.20 packs/day for 45 years    Types: Cigarettes  . Smokeless tobacco: Never Used  . Alcohol Use: 0.0 oz/week    0 Not specified per week     Comment: 6 pack in a month  . Drug Use: No  . Sexual Activity: Not on file   Other Topics Concern  . Not on file   Social History Narrative   Patient  lives at home with his wife. [facility]).    Disabled.   Right handed.   Caffeine- two cups daily.   Three step children.     ROS: [x]  Positive   [ ]  Negative   [ ]  All sytems reviewed and are negative  Cardiovascular: []  chest pain/pressure [x]  palpitations []  SOB lying flat []  DOE []  pain in legs while walking []  pain in feet when lying flat []  hx of DVT []  hx of phlebitis []  swelling in legs []  varicose veins  Pulmonary: []  productive cough []  asthma []  wheezing  Neurologic: []  weakness in []  arms []  legs []  numbness in [x]  bilaterally arms []  legs [] difficulty speaking or slurred speech []  temporary loss of vision in one eye []  dizziness  Hematologic: []  bleeding problems []  problems with blood clotting easily  GI []  vomiting blood []  blood in stool  GU: []  burning with urination []  blood in urine  Psychiatric: []  hx of major depression  Integumentary: []  rashes []  ulcers  Constitutional: []  fever []  chills   PHYSICAL EXAMINATION:  Filed Vitals:   12/15/14 1541  BP: 149/132  Pulse:    Body mass index is 28.75 kg/(m^2).  General:  WDWN in NAD Gait: Normal HENT: WNL; normocephalic Pulmonary: normal non-labored breathing , without Rales, rhonchi,  wheezing Cardiac: regular, without  Murmurs, rubs or gallops; without carotid bruits Abdomen: soft, NT, no masses Skin: without rashes,  without ulcers  Vascular Exam/Pulses:  Right Left  Radial absent 2+ (normal)  Ulnar 2+ (normal) 2+ (normal)   Extremities: without ischemic changes, without Gangrene , without cellulitis; without open wounds; well healed scar right forearm.  Deformity right wrist from surgery with bone graft. Musculoskeletal: without muscle wasting or atrophy  Neurologic: A&O X 3; Appropriate Affect ; SENSATION: normal; MOTOR FUNCTION:  moving all extremities equally. Speech is fluent/normal   Non-Invasive Vascular Imaging: Carotid Duplex Scan:  12/15/2014  -no significant  stenosis of the left ECA or bilateral CCA -right ECA stenosis is noted  -patent left CEA site with doppler velocities suggestive of <40% stenosis of the left proximal ICA and 40-59% stenosis of the right proximal ICA.  -no significant change in the bilateral ICA velocities when compared to previous exam on 12/11/13.  ASSESSMENT: 64 y.o. male here for f/u carotid duplex scan after left CEA 2009   PLAN: -pt is doing well and continues to remain asymptomatic.   -he will  return in one year for f/u carotid duplex -he will contact us sooner if becomes symptomatic -he states he has palpitations rarely-he is advised if this happens again, that he should be evaluated by his PCP.   Leontine Locket, PA-C [facility] [phone]  Clinic MD:   Pt seen and examined in conjunction with Dr. Donnetta Hutching    I have examined the patient, reviewed and agree with above.  EARLY, TODD, MD 12/15/2014 4:24 PM

## 2014-12-21 NOTE — Telephone Encounter (Signed)
Tried to call pt- LMOM to return call 

## 2014-12-22 ENCOUNTER — Telehealth: Payer: Self-pay | Admitting: Internal Medicine

## 2014-12-22 MED ORDER — AMOXICILL-CLARITHRO-LANSOPRAZ PO MISC: Freq: Two times a day (BID) | ORAL | Status: DC

## 2014-12-22 NOTE — Telephone Encounter (Signed)
Spoke with the pt about hpylori results. . See other phone note.

## 2014-12-22 NOTE — Telephone Encounter (Signed)
PATIENT RETURNED [name] [phone]

## 2014-12-22 NOTE — Telephone Encounter (Signed)
Pt is aware of results. rx sent to the pharmacy. He is not on any statins. And he is aware to hold his protonix while taking the abx and will restart after finishing abx.

## 2014-12-23 NOTE — Telephone Encounter (Signed)
Pt came by the office. prevpac is going to cost him $250.00. He was also having problems with the pharmacy telling him that he is allergic to several antibiotics. The pt stated he has never been allergic to any antibiotics. He said he is only allergic to lyrica and codeine. I verified that our chart has these in the allergy list. I called and left a message at [facility] that changing him to the generics. Also spoke with [facility]- ok for pt to take his protonix bid with the abx if it is cheaper for him that way. I left that in the message for [facility] and asked them to call me with any questions or problems.

## 2015-02-25 ENCOUNTER — Ambulatory Visit (INDEPENDENT_AMBULATORY_CARE_PROVIDER_SITE_OTHER): Payer: Medicare Other | Admitting: Gastroenterology

## 2015-02-25 ENCOUNTER — Encounter: Payer: Self-pay | Admitting: Gastroenterology

## 2015-02-25 ENCOUNTER — Other Ambulatory Visit: Payer: Self-pay

## 2015-02-25 VITALS — BP 129/74 | HR 76 | Temp 97.6°F | Ht 72.0 in | Wt 211.4 lb

## 2015-02-25 DIAGNOSIS — K279 Peptic ulcer, site unspecified, unspecified as acute or chronic, without hemorrhage or perforation: Secondary | ICD-10-CM | POA: Diagnosis not present

## 2015-02-25 NOTE — Progress Notes (Signed)
Referring Provider: Elsie Lincoln, MD Primary Care Physician:  Rocky Morel, MD Primary GI: Dr. Gala Romney   Chief Complaint  Patient presents with  . Follow-up    HPI:   Seth Savage is a 64 y.o. male presenting today with a history of PUD secondary to H.pylori. EGD Jan 2016.   Completed Prevpac. Occasional hemorrhoid issues, "come and go". Sometimes stool hard, sometimes not. Sometimes like peanut butter. Not taking supplemental fiber. No overt GI bleeding. Next colonoscopy due in 2018.   Still on Protonix BID.   Past Medical History  Diagnosis Date  . Hypertension   . Hypercholesterolemia   . Goiter     now on thyroid medication, s/p radioactive iodine treatment  . Carotid artery occlusion   . COPD (chronic obstructive pulmonary disease)   . Neuropathy   . H pylori ulcer Jan 2016    Treated with Prevpac    Past Surgical History  Procedure Laterality Date  . Colonoscopy  08/24/2004     Normal rectum,Small polyp at 25 cm, cold snared/ The remainder of the colonic mucosa appeared normal  . Rotator cuff repair  1992    left, metal implant   . Rotator cuff repair  1999    right  . Colectomy  2000    secondary to intussuception  . Radioactive iodine for thyroid    . Back surgery  2003  . Shoulder surgery    . Multiple bilateral arm/wrist surgeries after falls    . Colonoscopy  09/24/2012    Dr. Mike Craze hemorrhoids-likely source of hematochezia.Colonic diverticulosis  . Spine surgery    . Carotid endarterectomy    . Esophagogastroduodenoscopy N/A 12/09/2014    Dr. Gala Romney: Erosive reflux esophagitis. Peptic Ulcer disease secondary to H.pylori. small hiatal hernia. Nonbleeding duodenal AVM. Duodenal erosions. TREATED WITH PREVPAC    Current Outpatient Prescriptions  Medication Sig Dispense Refill  . aspirin EC 81 MG tablet Take 81 mg by mouth daily.    . fluticasone (FLONASE) 50 MCG/ACT nasal spray Place 2 sprays into both nostrils daily.    Marland Kitchen  losartan (COZAAR) 100 MG tablet Take 100 mg by mouth at bedtime.     . pantoprazole (PROTONIX) 40 MG tablet Take 40 mg by mouth 2 (two) times daily before a meal.    . rizatriptan (MAXALT-MLT) 5 MG disintegrating tablet Take 1 tablet (5 mg total) by mouth as needed for migraine. May repeat in 2 hours if needed (Patient not taking: Reported on 03/01/2015) 15 tablet 6  . SYNTHROID 150 MCG tablet Take 150 mcg by mouth every morning.     . topiramate (TOPAMAX) 100 MG tablet Take 100 mg by mouth 2 (two) times daily as needed (migraines).    . Pseudoeph-Doxylamine-DM-APAP (NYQUIL PO) Take 30 mLs by mouth at bedtime as needed (cold).     No current facility-administered medications for this visit.    Allergies as of 02/25/2015 - Review Complete 02/25/2015  Allergen Reaction Noted  . Lyrica [pregabalin] Shortness Of Breath 09/17/2012  . Codeine Rash 11/09/2014    Family History  Problem Relation Age of Onset  . Colon cancer Neg Hx   . COPD Mother   . Mental illness Mother   . Hypertension Mother   . Varicose Veins Mother   . Heart attack Mother   . Hypertension Father   . Alcohol abuse Father     History   Social History  . Marital Status: Married    Spouse Name: [name]  .  Number of Children: 0  . Years of Education: Ged   Occupational History  .      Disabled   Social History Main Topics  . Smoking status: Current Every Day Smoker -- 0.20 packs/day for 45 years    Types: Cigarettes  . Smokeless tobacco: Never Used  . Alcohol Use: 0.0 oz/week    0 Standard drinks or equivalent per week     Comment: 6 pack in a month  . Drug Use: No  . Sexual Activity: Not on file   Other Topics Concern  . None   Social History Narrative   Patient lives at home with his wife. [facility]).    Disabled.   Right handed.   Caffeine- two cups daily.   Three step children.    Review of Systems: As mentioned in HPI  Physical Exam: BP 129/74 mmHg  Pulse 76  Temp(Src) 97.6 F (36.4 C)  (Oral)  Ht 6' (1.829 m)  Wt 211 lb 7.2 oz (95.913 kg)  BMI 28.67 kg/m2 General:   Alert and oriented. No distress noted. Pleasant and cooperative.  Head:  Normocephalic and atraumatic. Eyes:  Conjuctiva clear without scleral icterus. Mouth:  Oral mucosa pink and moist. Good dentition. No lesions. Heart:  S1, S2 present without murmurs, rubs, or gallops. Regular rate and rhythm. Abdomen:  +BS, soft, non-tender and non-distended. No rebound or guarding. No HSM or masses noted. Msk:  Symmetrical without gross deformities. Normal posture. Extremities:  Without edema. Neurologic:  Alert and  oriented x4;  grossly normal neurologically. Skin:  Intact without significant lesions or rashes. Psych:  Alert and cooperative. Normal mood and affect.

## 2015-02-25 NOTE — Patient Instructions (Signed)
Continue taking Protonix twice a day, before breakfast and dinner.   We have scheduled you for an upper endoscopy with Dr. Gala Romney to follow-up on the ulcer.   Your next colonoscopy will be in 2018.

## 2015-03-01 ENCOUNTER — Encounter: Payer: Self-pay | Admitting: Gastroenterology

## 2015-03-02 ENCOUNTER — Encounter: Payer: Self-pay | Admitting: Gastroenterology

## 2015-03-02 NOTE — Assessment & Plan Note (Signed)
64 year old male with need for surveillance EGD due to PUD noted on EGD in Jan 2016. H.pylori as culprit, s/p treatment with Prevpac. Remains on Protonix BID with overall good control of GERD. Next colonoscopy due 2018.  Proceed with upper endoscopy in the near future with Dr. Gala Romney. The risks, benefits, and alternatives have been discussed in detail with patient. They have stated understanding and desire to proceed.  Continue Protonix BID

## 2015-03-02 NOTE — Progress Notes (Signed)
cc'ed to pcp °

## 2015-03-15 ENCOUNTER — Encounter (HOSPITAL_COMMUNITY)
Admission: RE | Disposition: A | Discharge status: Home / Self Care | Payer: Self-pay | Source: Ambulatory Visit | Attending: Internal Medicine

## 2015-03-15 ENCOUNTER — Ambulatory Visit (HOSPITAL_COMMUNITY)
Admission: RE | Admit: 2015-03-15 | Discharge: 2015-03-15 | Disposition: A | Discharge status: Home / Self Care | Payer: Medicare Other | Source: Ambulatory Visit | Attending: Internal Medicine | Admitting: Internal Medicine

## 2015-03-15 ENCOUNTER — Encounter (HOSPITAL_COMMUNITY): Payer: Self-pay | Admitting: *Deleted

## 2015-03-15 DIAGNOSIS — G629 Polyneuropathy, unspecified: Secondary | ICD-10-CM | POA: Diagnosis not present

## 2015-03-15 DIAGNOSIS — Z79899 Other long term (current) drug therapy: Secondary | ICD-10-CM | POA: Diagnosis not present

## 2015-03-15 DIAGNOSIS — F17210 Nicotine dependence, cigarettes, uncomplicated: Secondary | ICD-10-CM | POA: Diagnosis not present

## 2015-03-15 DIAGNOSIS — K31819 Angiodysplasia of stomach and duodenum without bleeding: Secondary | ICD-10-CM | POA: Diagnosis not present

## 2015-03-15 DIAGNOSIS — Z7951 Long term (current) use of inhaled steroids: Secondary | ICD-10-CM | POA: Diagnosis not present

## 2015-03-15 DIAGNOSIS — Z8711 Personal history of peptic ulcer disease: Secondary | ICD-10-CM | POA: Diagnosis not present

## 2015-03-15 DIAGNOSIS — Z7982 Long term (current) use of aspirin: Secondary | ICD-10-CM | POA: Insufficient documentation

## 2015-03-15 DIAGNOSIS — I10 Essential (primary) hypertension: Secondary | ICD-10-CM | POA: Insufficient documentation

## 2015-03-15 DIAGNOSIS — Z9049 Acquired absence of other specified parts of digestive tract: Secondary | ICD-10-CM | POA: Diagnosis not present

## 2015-03-15 DIAGNOSIS — E049 Nontoxic goiter, unspecified: Secondary | ICD-10-CM | POA: Diagnosis not present

## 2015-03-15 DIAGNOSIS — K279 Peptic ulcer, site unspecified, unspecified as acute or chronic, without hemorrhage or perforation: Secondary | ICD-10-CM

## 2015-03-15 DIAGNOSIS — J449 Chronic obstructive pulmonary disease, unspecified: Secondary | ICD-10-CM | POA: Insufficient documentation

## 2015-03-15 DIAGNOSIS — Z09 Encounter for follow-up examination after completed treatment for conditions other than malignant neoplasm: Secondary | ICD-10-CM | POA: Diagnosis present

## 2015-03-15 DIAGNOSIS — K449 Diaphragmatic hernia without obstruction or gangrene: Secondary | ICD-10-CM

## 2015-03-15 HISTORY — PX: ESOPHAGOGASTRODUODENOSCOPY: SHX5428

## 2015-03-15 SURGERY — EGD (ESOPHAGOGASTRODUODENOSCOPY)
Anesthesia: Moderate Sedation

## 2015-03-15 MED ORDER — MIDAZOLAM HCL 5 MG/5ML IJ SOLN
INTRAMUSCULAR | Status: DC | PRN
  Administered 2015-03-15 (×2): 2 mg via INTRAVENOUS

## 2015-03-15 MED ORDER — MIDAZOLAM HCL 5 MG/5ML IJ SOLN
INTRAMUSCULAR | Status: AC
  Filled 2015-03-15: qty 10

## 2015-03-15 MED ORDER — SODIUM CHLORIDE 0.9 % IV SOLN
INTRAVENOUS | Status: DC
  Administered 2015-03-15: 13:00:00 via INTRAVENOUS

## 2015-03-15 MED ORDER — LIDOCAINE VISCOUS 2 % MT SOLN
OROMUCOSAL | Status: AC
  Filled 2015-03-15: qty 15

## 2015-03-15 MED ORDER — MEPERIDINE HCL 100 MG/ML IJ SOLN
INTRAMUSCULAR | Status: AC
  Filled 2015-03-15: qty 2

## 2015-03-15 MED ORDER — LIDOCAINE VISCOUS 2 % MT SOLN
OROMUCOSAL | Status: DC | PRN
  Administered 2015-03-15: 3 mL via OROMUCOSAL

## 2015-03-15 MED ORDER — ONDANSETRON HCL 4 MG/2ML IJ SOLN
INTRAMUSCULAR | Status: DC | PRN
  Administered 2015-03-15: 4 mg via INTRAVENOUS

## 2015-03-15 MED ORDER — MEPERIDINE HCL 100 MG/ML IJ SOLN
INTRAMUSCULAR | Status: DC | PRN
  Administered 2015-03-15: 50 mg via INTRAVENOUS
  Administered 2015-03-15: 25 mg via INTRAVENOUS

## 2015-03-15 MED ORDER — ONDANSETRON HCL 4 MG/2ML IJ SOLN
INTRAMUSCULAR | Status: AC
  Filled 2015-03-15: qty 2

## 2015-03-15 MED ORDER — STERILE WATER FOR IRRIGATION IR SOLN
Status: DC | PRN
  Administered 2015-03-15: 13:00:00

## 2015-03-15 NOTE — Interval H&P Note (Signed)
History and Physical Interval Note:  03/15/2015 12:59 PM  Seth Savage  has presented today for surgery, with the diagnosis of PUD  The various methods of treatment have been discussed with the patient and family. After consideration of risks, benefits and other options for treatment, the patient has consented to  Procedure(s) with comments: ESOPHAGOGASTRODUODENOSCOPY (EGD) (N/A) - 145pm as a surgical intervention .  The patient's history has been reviewed, patient examined, no change in status, stable for surgery.  I have reviewed the patient's chart and labs.  Questions were answered to the patient's satisfaction.     Seth Savage  No change. Follow-up EGD per plan.The risks, benefits, limitations, alternatives and imponderables have been reviewed with the patient. Potential for esophageal dilation, biopsy, etc. have also been reviewed.  Questions have been answered. All parties agreeable.

## 2015-03-15 NOTE — H&P (View-Only) (Signed)
Referring Provider: Elsie Lincoln, MD Primary Care Physician:  Rocky Morel, MD Primary GI: Dr. Gala Romney   Chief Complaint  Patient presents with  . Follow-up    HPI:   Seth Savage is a 64 y.o. male presenting today with a history of PUD secondary to H.pylori. EGD Jan 2016.   Completed Prevpac. Occasional hemorrhoid issues, "come and go". Sometimes stool hard, sometimes not. Sometimes like peanut butter. Not taking supplemental fiber. No overt GI bleeding. Next colonoscopy due in 2018.   Still on Protonix BID.   Past Medical History  Diagnosis Date  . Hypertension   . Hypercholesterolemia   . Goiter     now on thyroid medication, s/p radioactive iodine treatment  . Carotid artery occlusion   . COPD (chronic obstructive pulmonary disease)   . Neuropathy   . H pylori ulcer Jan 2016    Treated with Prevpac    Past Surgical History  Procedure Laterality Date  . Colonoscopy  08/24/2004     Normal rectum,Small polyp at 25 cm, cold snared/ The remainder of the colonic mucosa appeared normal  . Rotator cuff repair  1992    left, metal implant   . Rotator cuff repair  1999    right  . Colectomy  2000    secondary to intussuception  . Radioactive iodine for thyroid    . Back surgery  2003  . Shoulder surgery    . Multiple bilateral arm/wrist surgeries after falls    . Colonoscopy  09/24/2012    Dr. Mike Craze hemorrhoids-likely source of hematochezia.Colonic diverticulosis  . Spine surgery    . Carotid endarterectomy    . Esophagogastroduodenoscopy N/A 12/09/2014    Dr. Gala Romney: Erosive reflux esophagitis. Peptic Ulcer disease secondary to H.pylori. small hiatal hernia. Nonbleeding duodenal AVM. Duodenal erosions. TREATED WITH PREVPAC    Current Outpatient Prescriptions  Medication Sig Dispense Refill  . aspirin EC 81 MG tablet Take 81 mg by mouth daily.    . fluticasone (FLONASE) 50 MCG/ACT nasal spray Place 2 sprays into both nostrils daily.    Marland Kitchen  losartan (COZAAR) 100 MG tablet Take 100 mg by mouth at bedtime.     . pantoprazole (PROTONIX) 40 MG tablet Take 40 mg by mouth 2 (two) times daily before a meal.    . rizatriptan (MAXALT-MLT) 5 MG disintegrating tablet Take 1 tablet (5 mg total) by mouth as needed for migraine. May repeat in 2 hours if needed (Patient not taking: Reported on 03/01/2015) 15 tablet 6  . SYNTHROID 150 MCG tablet Take 150 mcg by mouth every morning.     . topiramate (TOPAMAX) 100 MG tablet Take 100 mg by mouth 2 (two) times daily as needed (migraines).    . Pseudoeph-Doxylamine-DM-APAP (NYQUIL PO) Take 30 mLs by mouth at bedtime as needed (cold).     No current facility-administered medications for this visit.    Allergies as of 02/25/2015 - Review Complete 02/25/2015  Allergen Reaction Noted  . Lyrica [pregabalin] Shortness Of Breath 09/17/2012  . Codeine Rash 11/09/2014    Family History  Problem Relation Age of Onset  . Colon cancer Neg Hx   . COPD Mother   . Mental illness Mother   . Hypertension Mother   . Varicose Veins Mother   . Heart attack Mother   . Hypertension Father   . Alcohol abuse Father     History   Social History  . Marital Status: Married    Spouse Name: [name]  .  Number of Children: 0  . Years of Education: Ged   Occupational History  .      Disabled   Social History Main Topics  . Smoking status: Current Every Day Smoker -- 0.20 packs/day for 45 years    Types: Cigarettes  . Smokeless tobacco: Never Used  . Alcohol Use: 0.0 oz/week    0 Standard drinks or equivalent per week     Comment: 6 pack in a month  . Drug Use: No  . Sexual Activity: Not on file   Other Topics Concern  . None   Social History Narrative   Patient lives at home with his wife. [facility]).    Disabled.   Right handed.   Caffeine- two cups daily.   Three step children.    Review of Systems: As mentioned in HPI  Physical Exam: BP 129/74 mmHg  Pulse 76  Temp(Src) 97.6 F (36.4 C)  (Oral)  Ht 6' (1.829 m)  Wt 211 lb 7.2 oz (95.913 kg)  BMI 28.67 kg/m2 General:   Alert and oriented. No distress noted. Pleasant and cooperative.  Head:  Normocephalic and atraumatic. Eyes:  Conjuctiva clear without scleral icterus. Mouth:  Oral mucosa pink and moist. Good dentition. No lesions. Heart:  S1, S2 present without murmurs, rubs, or gallops. Regular rate and rhythm. Abdomen:  +BS, soft, non-tender and non-distended. No rebound or guarding. No HSM or masses noted. Msk:  Symmetrical without gross deformities. Normal posture. Extremities:  Without edema. Neurologic:  Alert and  oriented x4;  grossly normal neurologically. Skin:  Intact without significant lesions or rashes. Psych:  Alert and cooperative. Normal mood and affect.

## 2015-03-15 NOTE — Op Note (Signed)
[facility] [address] [place], [address]   ENDOSCOPY PROCEDURE REPORT  PATIENT: Seth Savage, Seth Savage  MR#: [id number] BIRTHDATE: [birth year] , 71  yrs. old GENDER: male ENDOSCOPIST: R.  Garfield Cornea, MD FACP FACG REFERRED BY:  Elsie Lincoln, M.D. PROCEDURE DATE:  2015-03-29 PROCEDURE:  EGD, diagnostic INDICATIONS:  history of gastric ulcer; surveillance examination. MEDICATIONS: Versed 4 mg IV and Demerol 75 mg IV in divided doses. Zofran 4 mg IV.  Xylocaine gel orally. ASA CLASS:      Class II  CONSENT: The risks, benefits, limitations, alternatives and imponderables have been discussed.  The potential for biopsy, esophogeal dilation, etc. have also been reviewed.  Questions have been answered.  All parties agreeable.  Please see the history and physical in the medical record for more information.  DESCRIPTION OF PROCEDURE: After the risks benefits and alternatives of the procedure were thoroughly explained, informed consent was obtained.  The EG-[id number] ([id number]) endoscope was introduced through the mouth and advanced to the second portion of the duodenum , limited by Without limitations. The instrument was slowly withdrawn as the mucosa was fully examined.    Normal-appearing esophageal mucosa.  Stomach empty.  Small hiatal hernia.  Previously noted gastric ulcers completely healed.  Patent pylorus.  Examination first and second portion of the duodenum revealed a tiny nonbleeding duodenal bulb or AVM only.  Retroflexed views revealed as previously described.     The scope was then withdrawn from the patient and the procedure completed.  COMPLICATIONS: There were no immediate complications.  ENDOSCOPIC IMPRESSION: Small hiatal hernia. Previously noted peptic ulcer disease completely healed . Duodenal AVM  RECOMMENDATIONS: May decrease Protonix to 40 mg once daily. Avoid nonsteroidals in the future as much as possible.  REPEAT EXAM:  eSigned:  R.  Garfield Cornea, MD Rosalita Chessman [facility] 03-29-15 1:23 PM    CC:  CPT CODES: ICD CODES:  The ICD and CPT codes recommended by this software are interpretations from the data that the clinical staff has captured with the software.  The verification of the translation of this report to the ICD and CPT codes and modifiers is the sole responsibility of the health care institution and practicing physician where this report was generated.  [facility]. will not be held responsible for the validity of the ICD and CPT codes included on this report.  AMA assumes no liability for data contained or not contained herein. CPT is a Designer, television/film set of the [employer].  PATIENT NAME:  [name], [name] MR#: [id number]

## 2015-03-15 NOTE — Discharge Instructions (Signed)
EGD Discharge instructions Please read the instructions outlined below and refer to this sheet in the next few weeks. These discharge instructions provide you with general information on caring for yourself after you leave the hospital. Your doctor may also give you specific instructions. While your treatment has been planned according to the most current medical practices available, unavoidable complications occasionally occur. If you have any problems or questions after discharge, please call your doctor. ACTIVITY  You may resume your regular activity but move at a slower pace for the next 24 hours.   Take frequent rest periods for the next 24 hours.   Walking will help expel (get rid of) the air and reduce the bloated feeling in your abdomen.   No driving for 24 hours (because of the anesthesia (medicine) used during the test).   You may shower.   Do not sign any important legal documents or operate any machinery for 24 hours (because of the anesthesia used during the test).  NUTRITION  Drink plenty of fluids.   You may resume your normal diet.   Begin with a light meal and progress to your normal diet.   Avoid alcoholic beverages for 24 hours or as instructed by your caregiver.  MEDICATIONS  You may resume your normal medications unless your caregiver tells you otherwise.  WHAT YOU CAN EXPECT TODAY  You may experience abdominal discomfort such as a feeling of fullness or gas pains.  FOLLOW-UP  Your doctor will discuss the results of your test with you.  SEEK IMMEDIATE MEDICAL ATTENTION IF ANY OF THE FOLLOWING OCCUR:  Excessive nausea (feeling sick to your stomach) and/or vomiting.   Severe abdominal pain and distention (swelling).   Trouble swallowing.   Temperature over 101 F (37.8 C).   Rectal bleeding or vomiting of blood.    Your ulcers have completely healed  You may decrease Protonix to 40 mg once daily (primarily for reflux)  Would avoid nonsteroidals  such as Aleve and Advil as much as possible going forward.

## 2015-03-16 ENCOUNTER — Encounter (HOSPITAL_COMMUNITY): Payer: Self-pay | Admitting: Internal Medicine

## 2015-03-22 ENCOUNTER — Observation Stay (HOSPITAL_COMMUNITY)
Admission: EM | Admit: 2015-03-22 | Discharge: 2015-03-23 | Disposition: A | Discharge status: Home / Self Care | Payer: Medicare Other | Attending: Internal Medicine | Admitting: Internal Medicine

## 2015-03-22 ENCOUNTER — Emergency Department (HOSPITAL_COMMUNITY): Payer: Medicare Other

## 2015-03-22 ENCOUNTER — Encounter (HOSPITAL_COMMUNITY): Payer: Self-pay | Admitting: *Deleted

## 2015-03-22 DIAGNOSIS — I6529 Occlusion and stenosis of unspecified carotid artery: Secondary | ICD-10-CM | POA: Diagnosis not present

## 2015-03-22 DIAGNOSIS — Z720 Tobacco use: Secondary | ICD-10-CM | POA: Diagnosis not present

## 2015-03-22 DIAGNOSIS — J449 Chronic obstructive pulmonary disease, unspecified: Secondary | ICD-10-CM | POA: Insufficient documentation

## 2015-03-22 DIAGNOSIS — I10 Essential (primary) hypertension: Secondary | ICD-10-CM

## 2015-03-22 DIAGNOSIS — G629 Polyneuropathy, unspecified: Secondary | ICD-10-CM | POA: Diagnosis not present

## 2015-03-22 DIAGNOSIS — Z8619 Personal history of other infectious and parasitic diseases: Secondary | ICD-10-CM | POA: Insufficient documentation

## 2015-03-22 DIAGNOSIS — R079 Chest pain, unspecified: Principal | ICD-10-CM | POA: Diagnosis present

## 2015-03-22 DIAGNOSIS — Z7951 Long term (current) use of inhaled steroids: Secondary | ICD-10-CM | POA: Insufficient documentation

## 2015-03-22 DIAGNOSIS — E049 Nontoxic goiter, unspecified: Secondary | ICD-10-CM | POA: Insufficient documentation

## 2015-03-22 DIAGNOSIS — Z79899 Other long term (current) drug therapy: Secondary | ICD-10-CM | POA: Insufficient documentation

## 2015-03-22 DIAGNOSIS — F17200 Nicotine dependence, unspecified, uncomplicated: Secondary | ICD-10-CM

## 2015-03-22 DIAGNOSIS — E780 Pure hypercholesterolemia: Secondary | ICD-10-CM | POA: Diagnosis not present

## 2015-03-22 DIAGNOSIS — Z7982 Long term (current) use of aspirin: Secondary | ICD-10-CM | POA: Insufficient documentation

## 2015-03-22 HISTORY — DX: Essential (primary) hypertension: I10

## 2015-03-22 LAB — CBC WITH DIFFERENTIAL/PLATELET
BASOS ABS: 0 10*3/uL (ref 0.0–0.1)
Basophils Relative: 0 % (ref 0–1)
Eosinophils Absolute: 0.2 10*3/uL (ref 0.0–0.7)
Eosinophils Relative: 2 % (ref 0–5)
HEMATOCRIT: 44.7 % (ref 39.0–52.0)
HEMOGLOBIN: 15.5 g/dL (ref 13.0–17.0)
Lymphocytes Relative: 18 % (ref 12–46)
Lymphs Abs: 1.6 10*3/uL (ref 0.7–4.0)
MCH: 30.9 pg (ref 26.0–34.0)
MCHC: 34.7 g/dL (ref 30.0–36.0)
MCV: 89 fL (ref 78.0–100.0)
Monocytes Absolute: 0.6 10*3/uL (ref 0.1–1.0)
Monocytes Relative: 6 % (ref 3–12)
Neutro Abs: 6.5 10*3/uL (ref 1.7–7.7)
Neutrophils Relative %: 74 % (ref 43–77)
PLATELETS: 197 10*3/uL (ref 150–400)
RBC: 5.02 MIL/uL (ref 4.22–5.81)
RDW: 13.6 % (ref 11.5–15.5)
WBC: 8.9 10*3/uL (ref 4.0–10.5)

## 2015-03-22 LAB — TROPONIN I
Troponin I: <0.03 ng/mL (ref <0.031)
Troponin I: <0.03 ng/mL (ref <0.031)

## 2015-03-22 LAB — COMPREHENSIVE METABOLIC PANEL
ALBUMIN: 4.3 g/dL (ref 3.5–5.0)
ALT: 23 U/L (ref 17–63)
ANION GAP: 8 (ref 5–15)
AST: 15 U/L (ref 15–41)
Alkaline Phosphatase: 91 U/L (ref 38–126)
BUN: 23 mg/dL — AB (ref 6–20)
CO2: 26 mmol/L (ref 22–32)
Calcium: 9.5 mg/dL (ref 8.9–10.3)
Chloride: 104 mmol/L (ref 101–111)
Creatinine, Ser: 1.11 mg/dL (ref 0.61–1.24)
GFR calc Af Amer: >60 mL/min (ref >60)
Glucose, Bld: 99 mg/dL (ref 70–99)
Potassium: 4.1 mmol/L (ref 3.5–5.1)
SODIUM: 138 mmol/L (ref 135–145)
Total Bilirubin: 0.6 mg/dL (ref 0.3–1.2)
Total Protein: 7.4 g/dL (ref 6.5–8.1)

## 2015-03-22 MED ORDER — ASPIRIN EC 81 MG PO TBEC
81.0000 mg | DELAYED_RELEASE_TABLET | Freq: Every day | ORAL | Status: DC
  Administered 2015-03-23: 81 mg via ORAL
  Filled 2015-03-22: qty 1

## 2015-03-22 MED ORDER — PANTOPRAZOLE SODIUM 40 MG PO TBEC: 40.0000 mg | DELAYED_RELEASE_TABLET | Freq: Every day | ORAL | Status: DC

## 2015-03-22 MED ORDER — HYDROCHLOROTHIAZIDE 12.5 MG PO CAPS
12.5000 mg | ORAL_CAPSULE | Freq: Every day | ORAL | Status: DC
  Administered 2015-03-22: 12.5 mg via ORAL
  Filled 2015-03-22: qty 1

## 2015-03-22 MED ORDER — TOPIRAMATE 100 MG PO TABS
100.0000 mg | ORAL_TABLET | Freq: Two times a day (BID) | ORAL | Status: DC | PRN
  Filled 2015-03-22: qty 1

## 2015-03-22 MED ORDER — LEVOTHYROXINE SODIUM 75 MCG PO TABS
150.0000 ug | ORAL_TABLET | Freq: Every day | ORAL | Status: DC
  Administered 2015-03-23: 150 ug via ORAL
  Filled 2015-03-22: qty 2

## 2015-03-22 MED ORDER — NITROGLYCERIN 2 % TD OINT
1.0000 [in_us] | TOPICAL_OINTMENT | Freq: Once | TRANSDERMAL | Status: AC
  Administered 2015-03-22: 1 [in_us] via TOPICAL
  Filled 2015-03-22: qty 1

## 2015-03-22 MED ORDER — FLUTICASONE PROPIONATE 50 MCG/ACT NA SUSP
2.0000 | Freq: Every day | NASAL | Status: DC
  Administered 2015-03-23: 2 via NASAL
  Filled 2015-03-22: qty 16

## 2015-03-22 MED ORDER — HEPARIN SODIUM (PORCINE) 5000 UNIT/ML IJ SOLN
5000.0000 [IU] | Freq: Three times a day (TID) | INTRAMUSCULAR | Status: DC
  Administered 2015-03-22 – 2015-03-23 (×2): 5000 [IU] via SUBCUTANEOUS
  Filled 2015-03-22 (×2): qty 1

## 2015-03-22 MED ORDER — LOSARTAN POTASSIUM 50 MG PO TABS
100.0000 mg | ORAL_TABLET | Freq: Every day | ORAL | Status: DC
  Administered 2015-03-22: 100 mg via ORAL
  Filled 2015-03-22: qty 2

## 2015-03-22 MED ORDER — ACETAMINOPHEN 325 MG PO TABS: 650.0000 mg | ORAL_TABLET | ORAL | Status: DC | PRN

## 2015-03-22 MED ORDER — ASPIRIN EC 81 MG PO TBEC: 81.0000 mg | DELAYED_RELEASE_TABLET | Freq: Every day | ORAL | Status: DC

## 2015-03-22 MED ORDER — LEVOTHYROXINE SODIUM 75 MCG PO TABS: 150.0000 ug | ORAL_TABLET | Freq: Every morning | ORAL | Status: DC

## 2015-03-22 MED ORDER — ONDANSETRON HCL 4 MG/2ML IJ SOLN: 4.0000 mg | Freq: Four times a day (QID) | INTRAMUSCULAR | Status: DC | PRN

## 2015-03-22 MED ORDER — PANTOPRAZOLE SODIUM 40 MG PO TBEC
40.0000 mg | DELAYED_RELEASE_TABLET | Freq: Every day | ORAL | Status: DC
  Administered 2015-03-23: 40 mg via ORAL
  Filled 2015-03-22: qty 1

## 2015-03-22 NOTE — ED Notes (Signed)
Chest pain for 4-5 days "stinging pain"

## 2015-03-22 NOTE — ED Notes (Signed)
MD at bedside. 

## 2015-03-22 NOTE — ED Notes (Signed)
Did have SOB during episode but after pain went away , pt says he felt better.

## 2015-03-22 NOTE — ED Notes (Signed)
"  Having chest pain for 4-5 days on and off.  I would sat down and pain would go away.  Today pain increases, felt like bee stings.  I took two baby ASA ay 1630 today."  No pain at this time.

## 2015-03-22 NOTE — Progress Notes (Signed)
Called for report. 

## 2015-03-22 NOTE — H&P (Signed)
[facility] History and Physical  Seth Savage VOH:[id number] DOB: 11/12/1951 DOA: 03/22/2015  Referring physician: ER PCP: Rocky Morel, MD   Chief Complaint: Chest pain  HPI: Seth Savage is a 64 y.o. male  64 yr old man with intermittent chest 'prickling' sensation/pain in left chest for the last week.Today had associated dyspnea and sweating.No radiation of pain.History of carotid artery stenosis on left with Carotid endarterectomy,hypertension.Continues to smoke 10 cigarettes /day.   Review of Systems:  Apart from symptoms above ,all systems negative.  Past Medical History  Diagnosis Date  . Hypertension   . Hypercholesterolemia   . Goiter     now on thyroid medication, s/p radioactive iodine treatment  . Carotid artery occlusion   . COPD (chronic obstructive pulmonary disease)   . Neuropathy   . H pylori ulcer Jan 2016    Treated with Prevpac   Past Surgical History  Procedure Laterality Date  . Colonoscopy  08/24/2004     Normal rectum,Small polyp at 25 cm, cold snared/ The remainder of the colonic mucosa appeared normal  . Rotator cuff repair  1992    left, metal implant   . Rotator cuff repair  1999    right  . Colectomy  2000    secondary to intussuception  . Radioactive iodine for thyroid    . Back surgery  2003  . Shoulder surgery    . Multiple bilateral arm/wrist surgeries after falls    . Colonoscopy  09/24/2012    Dr. Mike Craze hemorrhoids-likely source of hematochezia.Colonic diverticulosis  . Spine surgery    . Carotid endarterectomy    . Esophagogastroduodenoscopy N/A 12/09/2014    Dr. Gala Romney: Erosive reflux esophagitis. Peptic Ulcer disease secondary to H.pylori. small hiatal hernia. Nonbleeding duodenal AVM. Duodenal erosions. TREATED WITH PREVPAC  . Esophagogastroduodenoscopy N/A 03/15/2015    Procedure: ESOPHAGOGASTRODUODENOSCOPY (EGD);  Surgeon: Daneil Dolin, MD;  Location: [facility];  Service: Endoscopy;   Laterality: N/A;  145pm   Social History:  reports that he has been smoking Cigarettes.  He has a 9 pack-year smoking history. He has never used smokeless tobacco. He reports that he drinks alcohol. He reports that he does not use illicit drugs.  Allergies  Allergen Reactions  . Lyrica [Pregabalin] Shortness Of Breath  . Codeine Rash    Family History  Problem Relation Age of Onset  . Colon cancer Neg Hx   . COPD Mother   . Mental illness Mother   . Hypertension Mother   . Varicose Veins Mother   . Heart attack Mother   . Hypertension Father   . Alcohol abuse Father     Prior to Admission medications   Medication Sig Start Date End Date Taking? Authorizing Provider  aspirin EC 81 MG tablet Take 81 mg by mouth daily.   Yes Historical Provider, MD  fluticasone (FLONASE) 50 MCG/ACT nasal spray Place 2 sprays into both nostrils daily.   Yes Historical Provider, MD  hydrochlorothiazide (HYDRODIURIL) 25 MG tablet Take 12.5 mg by mouth at bedtime.  02/09/15  Yes Historical Provider, MD  losartan (COZAAR) 100 MG tablet Take 100 mg by mouth at bedtime.  08/28/12  Yes Historical Provider, MD  pantoprazole (PROTONIX) 40 MG tablet Take 40 mg by mouth daily.    Yes Historical Provider, MD  SYNTHROID 150 MCG tablet Take 150 mcg by mouth every morning.  08/18/12  Yes Historical Provider, MD  topiramate (TOPAMAX) 100 MG tablet Take 100 mg by mouth 2 (  two) times daily as needed (migraines).   Yes Historical Provider, MD  rizatriptan (MAXALT-MLT) 5 MG disintegrating tablet Take 1 tablet (5 mg total) by mouth as needed for migraine. May repeat in 2 hours if needed Patient not taking: Reported on 03/01/2015 09/03/13   Marcial Pacas, MD   Physical Exam: Filed Vitals:   03/22/15 1730 03/22/15 1745 03/22/15 1800 03/22/15 1830  BP: 122/71  123/74 137/75  Pulse: 72 77 76 74  Temp:      TempSrc:      Resp: 14 16 20 17   Height:      Weight:      SpO2: 96% 96% 96% 97%    Wt Readings from Last 3  Encounters:  03/22/15 95.255 kg (210 lb)  03/15/15 95.709 kg (211 lb)  02/25/15 95.913 kg (211 lb 7.2 oz)    General:  Appears calm and comfortable Eyes: PERRL, normal lids, irises & conjunctiva ENT: grossly normal hearing, lips & tongue Neck: no LAD, masses or thyromegaly Cardiovascular: RRR, no m/r/g. No LE edema. Telemetry: SR, no arrhythmias  Respiratory: CTA bilaterally, no w/r/r. Normal respiratory effort. Abdomen: soft, ntnd Skin: no rash or induration seen on limited exam Musculoskeletal: grossly normal tone BUE/BLE Psychiatric: grossly normal mood and affect, speech fluent and appropriate Neurologic: grossly non-focal.          Labs on Admission:  Basic Metabolic Panel:  Recent Labs Lab 03/22/15 1655  NA 138  K 4.1  CL 104  CO2 26  GLUCOSE 99  BUN 23*  CREATININE 1.11  CALCIUM 9.5   Liver Function Tests:  Recent Labs Lab 03/22/15 1655  AST 15  ALT 23  ALKPHOS 91  BILITOT 0.6  PROT 7.4  ALBUMIN 4.3   No results for input(s): LIPASE, AMYLASE in the last 168 hours. No results for input(s): AMMONIA in the last 168 hours. CBC:  Recent Labs Lab 03/22/15 1655  WBC 8.9  NEUTROABS 6.5  HGB 15.5  HCT 44.7  MCV 89.0  PLT 197   Cardiac Enzymes:  Recent Labs Lab 03/22/15 1655  TROPONINI <0.03    BNP (last 3 results) No results for input(s): BNP in the last 8760 hours.  ProBNP (last 3 results) No results for input(s): PROBNP in the last 8760 hours.  CBG: No results for input(s): GLUCAP in the last 168 hours.  Radiological Exams on Admission: Dg Chest Portable 1 View  03/22/2015   CLINICAL DATA:  Left chest pain for 1 week, worsening.  EXAM: PORTABLE CHEST - 1 VIEW  COMPARISON:  Single view of the chest 08/27/2014. PA and lateral chest 12/02/2012.  FINDINGS: The lungs are clear. Heart size is normal. There is no pneumothorax or pleural effusion. No focal bony abnormality is identified. Remote left humerus fracture is identified with hardware  in place.  IMPRESSION: No acute disease.   Electronically Signed   By: Inge Rise M.D.   On: 03/22/2015 17:33    EKG: Independently reviewed. NSR,no acute ST-T wave changes  Assessment/Plan   1. Chest pain.History does not sound cardiac but patient has several risk factors.Cycle Troponins.Cardiology consult.     Code Status: Full   DVT Prophylaxis:Heparin  Family Communication: Discussed plan with patient at bedside   Disposition Plan: home when medically stable  Time spent: 45 minutes  [facility] Pager [phone]

## 2015-03-22 NOTE — ED Provider Notes (Signed)
CSN: [id number]     Arrival date & time 03/22/15  1641 History   First MD Initiated Contact with Patient 03/22/15 1652     Chief Complaint  Patient presents with  . Chest Pain     (Consider location/radiation/quality/duration/timing/severity/associated sxs/prior Treatment) Patient is a 64 y.o. male presenting with chest pain. The history is provided by the patient (the pt complains of chest pain off and on for about a weak.  he has had some sweating).  Chest Pain Pain location:  Substernal area Pain quality: aching   Pain radiates to:  Does not radiate Pain severity:  Moderate Onset quality:  Sudden Timing:  Intermittent Progression:  Worsening Chronicity:  New Context: not breathing   Associated symptoms: no abdominal pain, no back pain, no cough, no fatigue and no headache     Past Medical History  Diagnosis Date  . Hypertension   . Hypercholesterolemia   . Goiter     now on thyroid medication, s/p radioactive iodine treatment  . Carotid artery occlusion   . COPD (chronic obstructive pulmonary disease)   . Neuropathy   . H pylori ulcer Jan 2016    Treated with Prevpac   Past Surgical History  Procedure Laterality Date  . Colonoscopy  08/24/2004     Normal rectum,Small polyp at 25 cm, cold snared/ The remainder of the colonic mucosa appeared normal  . Rotator cuff repair  1992    left, metal implant   . Rotator cuff repair  1999    right  . Colectomy  2000    secondary to intussuception  . Radioactive iodine for thyroid    . Back surgery  2003  . Shoulder surgery    . Multiple bilateral arm/wrist surgeries after falls    . Colonoscopy  09/24/2012    Dr. Mike Craze hemorrhoids-likely source of hematochezia.Colonic diverticulosis  . Spine surgery    . Carotid endarterectomy    . Esophagogastroduodenoscopy N/A 12/09/2014    Dr. Gala Romney: Erosive reflux esophagitis. Peptic Ulcer disease secondary to H.pylori. small hiatal hernia. Nonbleeding duodenal AVM. Duodenal  erosions. TREATED WITH PREVPAC  . Esophagogastroduodenoscopy N/A 03/15/2015    Procedure: ESOPHAGOGASTRODUODENOSCOPY (EGD);  Surgeon: Daneil Dolin, MD;  Location: [facility];  Service: Endoscopy;  Laterality: N/A;  145pm   Family History  Problem Relation Age of Onset  . Colon cancer Neg Hx   . COPD Mother   . Mental illness Mother   . Hypertension Mother   . Varicose Veins Mother   . Heart attack Mother   . Hypertension Father   . Alcohol abuse Father    History  Substance Use Topics  . Smoking status: Current Every Day Smoker -- 0.20 packs/day for 45 years    Types: Cigarettes  . Smokeless tobacco: Never Used  . Alcohol Use: 0.0 oz/week    0 Standard drinks or equivalent per week     Comment: 6 pack in a month    Review of Systems  Constitutional: Negative for appetite change and fatigue.  HENT: Negative for congestion, ear discharge and sinus pressure.   Eyes: Negative for discharge.  Respiratory: Negative for cough.   Cardiovascular: Positive for chest pain.  Gastrointestinal: Negative for abdominal pain and diarrhea.  Genitourinary: Negative for frequency and hematuria.  Musculoskeletal: Negative for back pain.  Skin: Negative for rash.  Neurological: Negative for seizures and headaches.  Psychiatric/Behavioral: Negative for hallucinations.      Allergies  Lyrica and Codeine  Home Medications  Prior to Admission medications   Medication Sig Start Date End Date Taking? Authorizing Provider  aspirin EC 81 MG tablet Take 81 mg by mouth daily.   Yes Historical Provider, MD  fluticasone (FLONASE) 50 MCG/ACT nasal spray Place 2 sprays into both nostrils daily.   Yes Historical Provider, MD  hydrochlorothiazide (HYDRODIURIL) 25 MG tablet Take 12.5 mg by mouth at bedtime.  02/09/15  Yes Historical Provider, MD  losartan (COZAAR) 100 MG tablet Take 100 mg by mouth at bedtime.  08/28/12  Yes Historical Provider, MD  pantoprazole (PROTONIX) 40 MG tablet Take 40 mg by  mouth daily.    Yes Historical Provider, MD  SYNTHROID 150 MCG tablet Take 150 mcg by mouth every morning.  08/18/12  Yes Historical Provider, MD  topiramate (TOPAMAX) 100 MG tablet Take 100 mg by mouth 2 (two) times daily as needed (migraines).   Yes Historical Provider, MD  rizatriptan (MAXALT-MLT) 5 MG disintegrating tablet Take 1 tablet (5 mg total) by mouth as needed for migraine. May repeat in 2 hours if needed Patient not taking: Reported on 03/01/2015 09/03/13   Marcial Pacas, MD   BP 137/75 mmHg  Pulse 74  Temp(Src) 98.1 F (36.7 C) (Oral)  Resp 17  Ht 6' (1.829 m)  Wt 210 lb (95.255 kg)  BMI 28.47 kg/m2  SpO2 97% Physical Exam  Constitutional: He is oriented to person, place, and time. He appears well-developed.  HENT:  Head: Normocephalic.  Eyes: Conjunctivae and EOM are normal. No scleral icterus.  Neck: Neck supple. No thyromegaly present.  Cardiovascular: Normal rate and regular rhythm.  Exam reveals no gallop and no friction rub.   No murmur heard. Pulmonary/Chest: No stridor. He has no wheezes. He has no rales. He exhibits no tenderness.  Abdominal: He exhibits no distension. There is no tenderness. There is no rebound.  Musculoskeletal: Normal range of motion. He exhibits no edema.  Lymphadenopathy:    He has no cervical adenopathy.  Neurological: He is oriented to person, place, and time. He exhibits normal muscle tone. Coordination normal.  Skin: No rash noted. No erythema.  Psychiatric: He has a normal mood and affect. His behavior is normal.    ED Course  Procedures (including critical care time) Labs Review Labs Reviewed  COMPREHENSIVE METABOLIC PANEL - Abnormal; Notable for the following:    BUN 23 (*)    All other components within normal limits  CBC WITH DIFFERENTIAL/PLATELET  TROPONIN I    Imaging Review Dg Chest Portable 1 View  03/22/2015   CLINICAL DATA:  Left chest pain for 1 week, worsening.  EXAM: PORTABLE CHEST - 1 VIEW  COMPARISON:  Single view  of the chest 08/27/2014. PA and lateral chest 12/02/2012.  FINDINGS: The lungs are clear. Heart size is normal. There is no pneumothorax or pleural effusion. No focal bony abnormality is identified. Remote left humerus fracture is identified with hardware in place.  IMPRESSION: No acute disease.   Electronically Signed   By: Inge Rise M.D.   On: 03/22/2015 17:33     EKG Interpretation   Date/Time:  Monday Mar 22 2015 16:47:09 EDT Ventricular Rate:  95 PR Interval:  160 QRS Duration: 81 QT Interval:  326 QTC Calculation: 410 R Axis:   56 Text Interpretation:  Sinus rhythm Confirmed by Kden Wagster  MD, Marshella Tello [phone])  on 03/22/2015 6:37:15 PM      MDM   Final diagnoses:  Chest pain at rest    Chest pain,  admit  Milton Ferguson, MD 03/22/15 [phone]

## 2015-03-22 NOTE — ED Notes (Signed)
Attempted to call report. Was advised nurse receiving pt will call this nurse back for report. 

## 2015-03-23 ENCOUNTER — Encounter (HOSPITAL_COMMUNITY): Payer: Self-pay | Admitting: Physician Assistant

## 2015-03-23 ENCOUNTER — Other Ambulatory Visit: Payer: Self-pay | Admitting: Cardiology

## 2015-03-23 DIAGNOSIS — F17200 Nicotine dependence, unspecified, uncomplicated: Secondary | ICD-10-CM

## 2015-03-23 DIAGNOSIS — Z720 Tobacco use: Secondary | ICD-10-CM

## 2015-03-23 DIAGNOSIS — R079 Chest pain, unspecified: Secondary | ICD-10-CM

## 2015-03-23 DIAGNOSIS — R072 Precordial pain: Secondary | ICD-10-CM

## 2015-03-23 DIAGNOSIS — E782 Mixed hyperlipidemia: Secondary | ICD-10-CM

## 2015-03-23 DIAGNOSIS — I10 Essential (primary) hypertension: Secondary | ICD-10-CM

## 2015-03-23 DIAGNOSIS — I6522 Occlusion and stenosis of left carotid artery: Secondary | ICD-10-CM

## 2015-03-23 LAB — TROPONIN I

## 2015-03-23 NOTE — Care Management Note (Signed)
Case Management Note  Patient Details  Name: ARPAN ESKELSON MRN: [id number] Date of Birth: [birth year]  Subjective/Objective:                  Pt from home, independent at baseline. Pt plans to discharge home today, no CM needs.   Action/Plan:   Expected Discharge Date:  03/23/15               Expected Discharge Plan:  Home/Self Care  In-House Referral:  NA  Discharge planning Services  CM Consult  Post Acute Care Choice:  NA Choice offered to:  NA  DME Arranged:    DME Agency:     HH Arranged:    HH Agency:     Status of Service:  Completed, signed off  Medicare Important Message Given:    Date Medicare IM Given:    Medicare IM give by:    Date Additional Medicare IM Given:    Additional Medicare Important Message give by:     If discussed at [place] of Stay Meetings, dates discussed:    Additional Comments:  Sherald Barge, RN 03/23/2015, 1:57 PM

## 2015-03-23 NOTE — Progress Notes (Signed)
Discharge instruction reviewed with patient . No distress noted. Escorted to lobby via wheelchair. IV removed. Wife present at bedside.

## 2015-03-23 NOTE — Consult Note (Signed)
CARDIOLOGY CONSULT NOTE   Patient ID: Seth Savage MRN: [id number] DOB/AGE: June 07, 1951 64 y.o.  Admit date: 03/22/2015  Primary Physician   Rocky Morel, MD Consulting Cardiologist   Dr. Satira Sark Reason for Consultation   Chest pain  [name] is a 64 y.o. year old male with a history of cerebrovascular disease, HTN, HL, COPD, tobacco use and H. Pylori. He was admitted 05/02 with chest pain, cardiology consultation requested.    He was previously on cholesterol medication but had muscle aches with several statins. He is not currently taking a statin. He has cut down on smoking, struggles to quit.   He has noticed decreasing exercise tolerance recently, he felt secondary to smoking. He developed episodes of sharp, lower left chest pain pain, starting 5 days ago. 3/10 at the worst. Different from L shoulder pain secondary to injury. Episodes would start at rest, while driving, under emotional stress, with exertion. The worst episode was while walking up an incline, associated with SOB and diaphoresis. No N&V. Total of about 15 episodes. None lasting over 30 seconds. He took ASA once, but the pain was gone before it took effect.    He was admitted and has not had any episodes since admission.   Past Medical History  Diagnosis Date  . Essential hypertension   . Hypercholesterolemia   . Goiter     Radioactive iodine treatment  . Carotid artery occlusion   . COPD (chronic obstructive pulmonary disease)   . Neuropathy   . H pylori ulcer Jan 2016    Treated with Prevpac     Past Surgical History  Procedure Laterality Date  . Colonoscopy  08/24/2004    Normal rectum,Small polyp at 25 cm, cold snared/ The remainder of the colonic mucosa appeared normal  . Rotator cuff repair Left 1992    Metal implant   . Rotator cuff repair Right 1999  . Colectomy  2000    Secondary to intussuception  . Back surgery  2003  . Shoulder surgery    . Multiple  bilateral arm/wrist surgeries after falls    . Colonoscopy  09/24/2012    Dr. Mike Craze hemorrhoids-likely source of hematochezia.Colonic diverticulosis  . Spine surgery    . Carotid endarterectomy    . Esophagogastroduodenoscopy N/A 12/09/2014    Dr. Gala Romney: Erosive reflux esophagitis. Peptic Ulcer disease secondary to H.pylori. small hiatal hernia. Nonbleeding duodenal AVM. Duodenal erosions. TREATED WITH PREVPAC  . Esophagogastroduodenoscopy N/A 03/15/2015    Procedure: ESOPHAGOGASTRODUODENOSCOPY (EGD);  Surgeon: Daneil Dolin, MD;  Location: [facility];  Service: Endoscopy;  Laterality: N/A;  145pm    Allergies  Allergen Reactions  . Lyrica [Pregabalin] Shortness Of Breath  . Codeine Rash    I have reviewed the patient's current medications . aspirin EC  81 mg Oral Daily  . fluticasone  2 spray Each Nare Daily  . heparin  5,000 Units Subcutaneous 3 times per day  . hydrochlorothiazide  12.5 mg Oral QHS  . levothyroxine  150 mcg Oral QAC breakfast  . losartan  100 mg Oral QHS  . pantoprazole  40 mg Oral Daily     acetaminophen, ondansetron (ZOFRAN) IV, topiramate  Prior to Admission medications   Medication Sig Start Date End Date Taking? Authorizing Provider  aspirin EC 81 MG tablet Take 81 mg by mouth daily.   Yes Historical Provider, MD  fluticasone (FLONASE) 50 MCG/ACT nasal spray Place 2 sprays into both nostrils daily.  Yes Historical Provider, MD  hydrochlorothiazide (HYDRODIURIL) 25 MG tablet Take 12.5 mg by mouth at bedtime.  02/09/15  Yes Historical Provider, MD  losartan (COZAAR) 100 MG tablet Take 100 mg by mouth at bedtime.  08/28/12  Yes Historical Provider, MD  pantoprazole (PROTONIX) 40 MG tablet Take 40 mg by mouth daily.    Yes Historical Provider, MD  SYNTHROID 150 MCG tablet Take 150 mcg by mouth every morning.  08/18/12  Yes Historical Provider, MD  topiramate (TOPAMAX) 100 MG tablet Take 100 mg by mouth 2 (two) times daily as needed (migraines).   Yes  Historical Provider, MD  rizatriptan (MAXALT-MLT) 5 MG disintegrating tablet Take 1 tablet (5 mg total) by mouth as needed for migraine. May repeat in 2 hours if needed Patient not taking: Reported on 03/01/2015 09/03/13   Marcial Pacas, MD     History   Social History  . Marital Status: Married    Spouse Name: [name]  . Number of Children: 0  . Years of Education: Ged   Occupational History  .      Disabled   Social History Main Topics  . Smoking status: Current Every Day Smoker -- 0.20 packs/day for 45 years    Types: Cigarettes  . Smokeless tobacco: Never Used  . Alcohol Use: 0.0 oz/week    0 Standard drinks or equivalent per week     Comment: 6 pack in a month  . Drug Use: No  . Sexual Activity: Not on file   Other Topics Concern  . Not on file   Social History Narrative   Patient lives at home with his wife. [facility]).    Disabled.   Right handed.   Caffeine- two cups daily.   Three step children.    Family Status  Relation Status Death Age  . Mother Deceased   . Father Deceased    Family History  Problem Relation Age of Onset  . Colon cancer Neg Hx   . COPD Mother   . Mental illness Mother   . Hypertension Mother   . Varicose Veins Mother   . Heart attack Mother 21  . Hypertension Father   . Alcohol abuse Father      ROS:  Full 14 point review of systems complete and found to be negative unless listed above.  Physical Exam: Blood pressure 107/57, pulse 74, temperature 98.4 F (36.9 C), temperature source Oral, resp. rate 18, height 6' (1.829 m), weight 210 lb (95.255 kg), SpO2 98 %.  General: Well developed, well nourished, male in no acute distress Head: Eyes PERRLA, No xanthomas.   Normocephalic and atraumatic, oropharynx without edema or exudate. Dentition: good Lungs : few dry rales, good air exchange  Heart: HRRR S1 S2, no rub/gallop, no murmur. pulses are 2+ all 4 extrem.   Neck: R carotid bruit, scar on L. No lymphadenopathy.  JVD not  elevated Abdomen: Bowel sounds present, abdomen soft and non-tender without masses or hernias noted. Msk:  No spine or cva tenderness. No weakness, no joint deformities or effusions. Extremities: No clubbing or cyanosis.  edema.  Neuro: Alert and oriented X 3. No focal deficits noted. Psych:  Good affect, responds appropriately Skin: No rashes or lesions noted.  Labs:   Lab Results  Component Value Date   WBC 8.9 03/22/2015   HGB 15.5 03/22/2015   HCT 44.7 03/22/2015   MCV 89.0 03/22/2015   PLT 197 03/22/2015     Recent Labs Lab 03/22/15 1655  NA  138  K 4.1  CL 104  CO2 26  BUN 23*  CREATININE 1.11  CALCIUM 9.5  PROT 7.4  BILITOT 0.6  ALKPHOS 91  ALT 23  AST 15  GLUCOSE 99  ALBUMIN 4.3   No results found for: MG  Recent Labs  03/22/15 1655 03/22/15 1935 03/22/15 2133 03/23/15 0043  TROPONINI <0.03 <0.03 <0.03 <0.03   ECG:  SR, no acute ischemic changes.  Radiology:  Dg Chest Portable 1 View 03/22/2015   CLINICAL DATA:  Left chest pain for 1 week, worsening.  EXAM: PORTABLE CHEST - 1 VIEW  COMPARISON:  Single view of the chest 08/27/2014. PA and lateral chest 12/02/2012.  FINDINGS: The lungs are clear. Heart size is normal. There is no pneumothorax or pleural effusion. No focal bony abnormality is identified. Remote left humerus fracture is identified with hardware in place.  IMPRESSION: No acute disease.   Electronically Signed   By: Inge Rise M.D.   On: 03/22/2015 17:33    ASSESSMENT AND PLAN:   The patient was seen today by Dr Domenic Polite, the patient evaluated and the data reviewed.  Active Problems:   Chest pain - atypical symptoms, multiple CRFs - pt feels he could do a GXT and greatly prefers OP eval. - think he is stable for d/c, can do OP MV or GXT, MD advise    Hyperlipidemia - primary MD tried multiple statins in the past w/out success - f/u with primary MD, would encourage low dose rx once a week if + for CAD  Signed: Rosaria Ferries,  PA-C Beeper [phone]  Co-Sign MD   Attending note:  Patient seen and examined. Reviewed records and discussed the case with Ms. Ahmed Prima PA-C. Mr. Maack presents with recent episodes of chest discomfort since Friday, describes a "prickly" sensation on the left side of his chest without any clear precipitant. Sometimes these sensations have felt more sharp and stinging, he also reports an episode of diaphoresis and fatigue while walking outdoors. The chest discomfort spells have lasted only 10-15 seconds at a time. Otherwise he reports no change in his baseline health status. He does have a history of documented atherosclerotic disease in the form of carotid artery disease status post left CEA with Dr. Donnetta Hutching. Also history of hypertension and hyperlipidemia. Cardiac markers have been normal arguing against ACS, and his ECG is also normal. Chest x-ray shows no acute findings. On examination he appears comfortable, lungs are clear without labored breathing, cardiac exam reveals RRR without significant gallop or rub. I reviewed the situation with the patient and his wife, went over his testing so far. He would like to go home and is agreeable to follow-up outpatient cardiac testing for additional risk stratification. We will obtain an exercise Cardiolite, scheduled for this week. We will plan to call him with the results, and can arrange additional follow-up as needed.  Satira Sark, M.D., F.A.C.C.

## 2015-03-23 NOTE — Discharge Summary (Addendum)
Physician Discharge Summary  Seth Savage ZOX:[id number] DOB: 27-Dec-1950 DOA: 03/22/2015  PCP: Trinna Post, MD  Admit date: 03/22/2015 Discharge date: 03/23/2015  Recommendations for Outpatient Follow-up:  1. Pt will need to follow up with PCP in 2 weeks post discharge   Discharge Diagnoses:  Atypical chest pain -HEART score = 3 -Troponins negative 4 -EKG without any concerning ST-T wave changes - patient was seen by cardiology during the admission-- >agreed the patient was stable for discharge without further inpatient testing -The patient will need a follow-up cardiology for outpatient exercise stress test -Continue aspirin 81 mg daily  Tobacco abuse -Tobacco cessation discussed Hypertension -Continue losartan and HCTZ Peptic ulcer disease -03/15/2015 EGD reveals healed ulcers, small hiatus hernia, duodenal AVM -Continue daily Protonix Hypothyroidism -Continue Synthroid  Discharge Condition: home  Disposition:  Follow-up Information    Follow up with [facility] On 03/25/2015.   Why:  Register at Radiology Dept at 9:30 am for stress test. Do not eat or drink after midnight.   Contact information:   [phone]    home  Diet:heart healthy Wt Readings from Last 3 Encounters:  03/22/15 95.255 kg (210 lb)  03/15/15 95.709 kg (211 lb)  02/25/15 95.913 kg (211 lb 7.2 oz)    History of present illness:  64 year old male with a history of hypertension, hyperlipidemia, COPD, carotid endarterectomy presented with 4-5 day history of chest discomfort. The patient described the chest discomfort as a bee sting-like sensation that lasts approximately 10-15 seconds. He had some associated shortness of breath and diaphoresis, but has not had any progressive dyspnea on exertion. Unfortunately, he continues to smoke one half pack per day. He denies any dizziness or syncope. There is no family history of premature coronary disease. Presently, he is hemodynamically  stable and pain-free.  Cardiology was consulted. They felt that the patient was stable for discharge and follow up for outpatient exercise stress test. The patient was instructed to continue aspirin 81 mg daily  Consultants: cardiology  Discharge Exam: Filed Vitals:   03/23/15 0514  BP: 107/57  Pulse: 74  Temp: 98.4 F (36.9 C)  Resp: 18   Filed Vitals:   03/22/15 1916 03/22/15 1930 03/22/15 2008 03/23/15 0514  BP: 142/91 128/75 147/68 107/57  Pulse: 75 82  74  Temp: 98.2 F (36.8 C)  98.2 F (36.8 C) 98.4 F (36.9 C)  TempSrc: Oral  Oral Oral  Resp: 18 18  18   Height:   6' (1.829 m)   Weight:      SpO2: 97% 97% 98% 98%   General: A&O x 3, NAD, pleasant, cooperative Cardiovascular: RRR, no rub, no gallop, no S3 Respiratory: CTAB, no wheeze, no rhonchi Abdomen:soft, nontender, nondistended, positive bowel sounds Extremities: No edema, No lymphangitis, no petechiae  Discharge Instructions      Discharge Instructions    Diet - low sodium heart healthy    Complete by:  As directed      Increase activity slowly    Complete by:  As directed             Medication List    STOP taking these medications        rizatriptan 5 MG disintegrating tablet  Commonly known as:  MAXALT-MLT      TAKE these medications        aspirin EC 81 MG tablet  Take 81 mg by mouth daily.     fluticasone 50 MCG/ACT nasal spray  Commonly known as:  FLONASE  Place 2 sprays into both nostrils daily.     hydrochlorothiazide 25 MG tablet  Commonly known as:  HYDRODIURIL  Take 12.5 mg by mouth at bedtime.     losartan 100 MG tablet  Commonly known as:  COZAAR  Take 100 mg by mouth at bedtime.     pantoprazole 40 MG tablet  Commonly known as:  PROTONIX  Take 40 mg by mouth daily.     SYNTHROID 150 MCG tablet  Generic drug:  levothyroxine  Take 150 mcg by mouth every morning.     topiramate 100 MG tablet  Commonly known as:  TOPAMAX  Take 100 mg by mouth 2 (two) times daily  as needed (migraines).         The results of significant diagnostics from this hospitalization (including imaging, microbiology, ancillary and laboratory) are listed below for reference.    Significant Diagnostic Studies: Dg Chest Portable 1 View  03/22/2015   CLINICAL DATA:  Left chest pain for 1 week, worsening.  EXAM: PORTABLE CHEST - 1 VIEW  COMPARISON:  Single view of the chest 08/27/2014. PA and lateral chest 12/02/2012.  FINDINGS: The lungs are clear. Heart size is normal. There is no pneumothorax or pleural effusion. No focal bony abnormality is identified. Remote left humerus fracture is identified with hardware in place.  IMPRESSION: No acute disease.   Electronically Signed   By: Drusilla Kanner M.D.   On: 03/22/2015 17:33     Microbiology: No results found for this or any previous visit (from the past 240 hour(s)).   Labs: Basic Metabolic Panel:  Recent Labs Lab 03/22/15 1655  NA 138  K 4.1  CL 104  CO2 26  GLUCOSE 99  BUN 23*  CREATININE 1.11  CALCIUM 9.5   Liver Function Tests:  Recent Labs Lab 03/22/15 1655  AST 15  ALT 23  ALKPHOS 91  BILITOT 0.6  PROT 7.4  ALBUMIN 4.3   No results for input(s): LIPASE, AMYLASE in the last 168 hours. No results for input(s): AMMONIA in the last 168 hours. CBC:  Recent Labs Lab 03/22/15 1655  WBC 8.9  NEUTROABS 6.5  HGB 15.5  HCT 44.7  MCV 89.0  PLT 197   Cardiac Enzymes:  Recent Labs Lab 03/22/15 1655 03/22/15 1935 03/22/15 2133 03/23/15 0043  TROPONINI <0.03 <0.03 <0.03 <0.03   BNP: Invalid input(s): POCBNP CBG: No results for input(s): GLUCAP in the last 168 hours.  Time coordinating discharge:  Greater than 30 minutes  Signed:  Alailah Safley, DO [facility] Pager: [phone] 03/23/2015, 1:52 PM

## 2015-03-25 ENCOUNTER — Other Ambulatory Visit: Payer: Self-pay

## 2015-03-25 ENCOUNTER — Encounter (HOSPITAL_COMMUNITY)
Admission: RE | Admit: 2015-03-25 | Discharge: 2015-03-25 | Disposition: A | Discharge status: Home / Self Care | Payer: Medicare Other | Source: Ambulatory Visit | Attending: Cardiology | Admitting: Cardiology

## 2015-03-25 ENCOUNTER — Encounter (HOSPITAL_COMMUNITY): Payer: Self-pay

## 2015-03-25 ENCOUNTER — Ambulatory Visit (HOSPITAL_COMMUNITY)
Admit: 2015-03-25 | Discharge: 2015-03-25 | Disposition: A | Discharge status: Home / Self Care | Payer: Medicare Other | Source: Ambulatory Visit | Attending: Cardiology | Admitting: Cardiology

## 2015-03-25 DIAGNOSIS — R072 Precordial pain: Secondary | ICD-10-CM | POA: Diagnosis present

## 2015-03-25 DIAGNOSIS — R079 Chest pain, unspecified: Secondary | ICD-10-CM

## 2015-03-25 LAB — MYOCARDIAL PERFUSION IMAGING
CHL CUP RESTING HR STRESS: 62 {beats}/min
CSEPED: 6 min
CSEPHR: 91 %
Estimated workload: 7 METS
Exercise duration (sec): 11 s
LVDIAVOL: 99 mL
LVSYSVOL: 43 mL
MPHR: 159 {beats}/min
NUC STRESS EF: 57 %
Peak HR: 146 {beats}/min
SRS: 0
SSS: 0
TID: 1.02

## 2015-03-25 MED ORDER — REGADENOSON 0.4 MG/5ML IV SOLN
INTRAVENOUS | Status: AC
  Filled 2015-03-25: qty 5

## 2015-03-25 MED ORDER — TECHNETIUM TC 99M SESTAMIBI GENERIC - CARDIOLITE
30.0000 | Freq: Once | INTRAVENOUS | Status: AC | PRN
  Administered 2015-03-25: 30 via INTRAVENOUS

## 2015-03-25 MED ORDER — TECHNETIUM TC 99M SESTAMIBI - CARDIOLITE
10.0000 | Freq: Once | INTRAVENOUS | Status: AC | PRN
  Administered 2015-03-25: 11 via INTRAVENOUS

## 2015-03-25 MED ORDER — SODIUM CHLORIDE 0.9 % IJ SOLN
INTRAMUSCULAR | Status: AC
  Administered 2015-03-25: 10 mL
  Filled 2015-03-25: qty 3

## 2015-03-26 LAB — NM MYOCAR MULTI W/SPECT W/WALL MOTION / EF
LV dias vol: 99 mL
LVSYSVOL: 43 mL
RATE: 0.3
SDS: 0
SRS: 0
SSS: 0
TID: 1.02

## 2015-09-06 ENCOUNTER — Other Ambulatory Visit: Payer: Self-pay

## 2015-09-06 MED ORDER — PANTOPRAZOLE SODIUM 40 MG PO TBEC: 40.0000 mg | DELAYED_RELEASE_TABLET | Freq: Every day | ORAL | Status: DC

## 2015-12-10 ENCOUNTER — Encounter: Payer: Self-pay | Admitting: Vascular Surgery

## 2015-12-13 ENCOUNTER — Encounter: Payer: Self-pay | Admitting: Vascular Surgery

## 2015-12-21 ENCOUNTER — Ambulatory Visit (HOSPITAL_COMMUNITY)
Admission: RE | Admit: 2015-12-21 | Discharge: 2015-12-21 | Disposition: A | Discharge status: Home / Self Care | Payer: Medicare Other | Source: Ambulatory Visit | Attending: Vascular Surgery | Admitting: Vascular Surgery

## 2015-12-21 ENCOUNTER — Encounter: Payer: Self-pay | Admitting: Vascular Surgery

## 2015-12-21 ENCOUNTER — Ambulatory Visit (INDEPENDENT_AMBULATORY_CARE_PROVIDER_SITE_OTHER): Payer: Medicare Other | Admitting: Vascular Surgery

## 2015-12-21 VITALS — BP 151/95 | HR 79 | Temp 97.3°F | Resp 16 | Ht 71.5 in | Wt 217.8 lb

## 2015-12-21 DIAGNOSIS — I6523 Occlusion and stenosis of bilateral carotid arteries: Secondary | ICD-10-CM | POA: Diagnosis not present

## 2015-12-21 DIAGNOSIS — I10 Essential (primary) hypertension: Secondary | ICD-10-CM | POA: Diagnosis not present

## 2015-12-21 DIAGNOSIS — Z48812 Encounter for surgical aftercare following surgery on the circulatory system: Secondary | ICD-10-CM

## 2015-12-21 DIAGNOSIS — E7800 Pure hypercholesterolemia, unspecified: Secondary | ICD-10-CM | POA: Diagnosis not present

## 2015-12-21 NOTE — Progress Notes (Signed)
Vascular and Vein Specialist of [facility]  Patient name: Seth Savage MRN: [id number] DOB: 1950/12/22 Sex: male  REASON FOR VISIT:  Today for follow-up carotid disease  HPI: Seth STANBERRY is a 65 y.o. male  Known to me from prior left carotid endarterectomy in April 2009. He has been followed with serial a duplex follow-up. He has had no neurologic deficits. He quit smoking roughly 5 months ago with a recent diagnosis of COPD. He had been a lifelong smoker. He has had no cardiac difficulties.  Past Medical History  Diagnosis Date  . Essential hypertension   . Hypercholesterolemia   . Goiter     Radioactive iodine treatment  . Carotid artery occlusion   . COPD (chronic obstructive pulmonary disease) ([place])   . Neuropathy ([place])   . H pylori ulcer Jan 2016    Treated with Prevpac  . Asthma     Childhood    Family History  Problem Relation Age of Onset  . Colon cancer Neg Hx   . COPD Mother   . Mental illness Mother   . Hypertension Mother   . Varicose Veins Mother   . Heart attack Mother 21  . Hypertension Father   . Alcohol abuse Father     SOCIAL HISTORY: Social History  Substance Use Topics  . Smoking status: Former Smoker -- 0.20 packs/day for 45 years    Types: Cigarettes    Quit date: 08/25/2015  . Smokeless tobacco: Never Used  . Alcohol Use: 0.0 oz/week    0 Standard drinks or equivalent per week     Comment: 2 beers per month    Allergies  Allergen Reactions  . Lyrica [Pregabalin] Shortness Of Breath  . Codeine Rash    Current Outpatient Prescriptions  Medication Sig Dispense Refill  . aspirin EC 81 MG tablet Take 81 mg by mouth daily.    . fluticasone (FLONASE) 50 MCG/ACT nasal spray Place 1 spray into both nostrils daily.     . hydrochlorothiazide (HYDRODIURIL) 25 MG tablet Take 12.5 mg by mouth at bedtime.     Marland Kitchen losartan (COZAAR) 100 MG tablet Take 100 mg by mouth at bedtime.     . pantoprazole (PROTONIX) 40 MG tablet Take 1 tablet (40 mg  total) by mouth daily. 30 tablet 11  . SYNTHROID 150 MCG tablet Take 150 mcg by mouth every morning.     . topiramate (TOPAMAX) 100 MG tablet Take 100 mg by mouth 2 (two) times daily as needed (migraines). Reported on 12/21/2015     No current facility-administered medications for this visit.    REVIEW OF SYSTEMS:  [X]  denotes positive finding, [ ]  denotes negative finding Cardiac  Comments:  Chest pain or chest pressure:    Shortness of breath upon exertion:    Short of breath when lying flat:    Irregular heart rhythm:        Vascular    Pain in calf, thigh, or hip brought on by ambulation:    Pain in feet at night that wakes you up from your sleep:     Blood clot in your veins:    Leg swelling:         Pulmonary    Oxygen at home:    Productive cough:     Wheezing:         Neurologic    Sudden weakness in arms or legs:     Sudden numbness in arms or legs:  Sudden onset of difficulty speaking or slurred speech:    Temporary loss of vision in one eye:     Problems with dizziness:         Gastrointestinal    Blood in stool:     Vomited blood:         Genitourinary    Burning when urinating:     Blood in urine:        Psychiatric    Major depression:         Hematologic    Bleeding problems:    Problems with blood clotting too easily:        Skin    Rashes or ulcers:        Constitutional    Fever or chills:      PHYSICAL EXAM: Filed Vitals:   12/21/15 1533 12/21/15 1538  BP: 145/84 151/95  Pulse: 79   Temp: 97.3 F (36.3 C)   TempSrc: Oral   Resp: 16   Height: 5' 11.5" (1.816 m)   Weight: 217 lb 12.8 oz (98.793 kg)   SpO2: 97%     GENERAL: The patient is a well-nourished male, in no acute distress. The vital signs are documented above. CARDIAC: There is a regular rate and rhythm.  VASCULAR:  2+ radial pulses bilaterally. Well-healed left carotid endarterectomy site. No bruits bilaterally PULMONARY: There is good air exchange bilaterally  without wheezing or rales. ABDOMEN: Soft and non-tender with normal pitched bowel sounds.  MUSCULOSKELETAL: There are no major deformities or cyanosis. NEUROLOGIC: No focal weakness or paresthesias are detected. SKIN: There are no ulcers or rashes noted. PSYCHIATRIC: The patient has a normal affect.  DATA:   carotid duplex today were widely patent endarterectomy on the left. The right carotid showed a progression to 60-79% stenosis.  MEDICAL ISSUES:  moderate to severe right internal carotid artery stenosis, asymptomatic. Again reviewed symptoms of carotid disease with patient he knows to report [place] immediately should this occur. Otherwise we will see him again in 6 months. We will plan CT angiogram for full visualization of his lesion at that time.  No Follow-up on file.   Curt Jews [facility] [place]: [phone]

## 2015-12-21 NOTE — Progress Notes (Signed)
Filed Vitals:   12/21/15 1533 12/21/15 1538  BP: 145/84 151/95  Pulse: 79   Temp: 97.3 F (36.3 C)   TempSrc: Oral   Resp: 16   Height: 5' 11.5" (1.816 m)   Weight: 217 lb 12.8 oz (98.793 kg)   SpO2: 97%

## 2015-12-22 NOTE — Addendum Note (Signed)
Addended by: Thresa Ross C on: 12/22/2015 10:01 AM   Modules accepted: Orders

## 2016-02-17 IMAGING — CR DG CHEST 1V PORT
1 series · 1 of 1 positions shown · non-contrast
Comparison: Single view of the chest 08/27/2014. PA and lateral
chest 12/02/2012.

CLINICAL DATA: Left chest pain for 1 week, worsening.

EXAM:
PORTABLE CHEST - 1 VIEW

[ap portable]
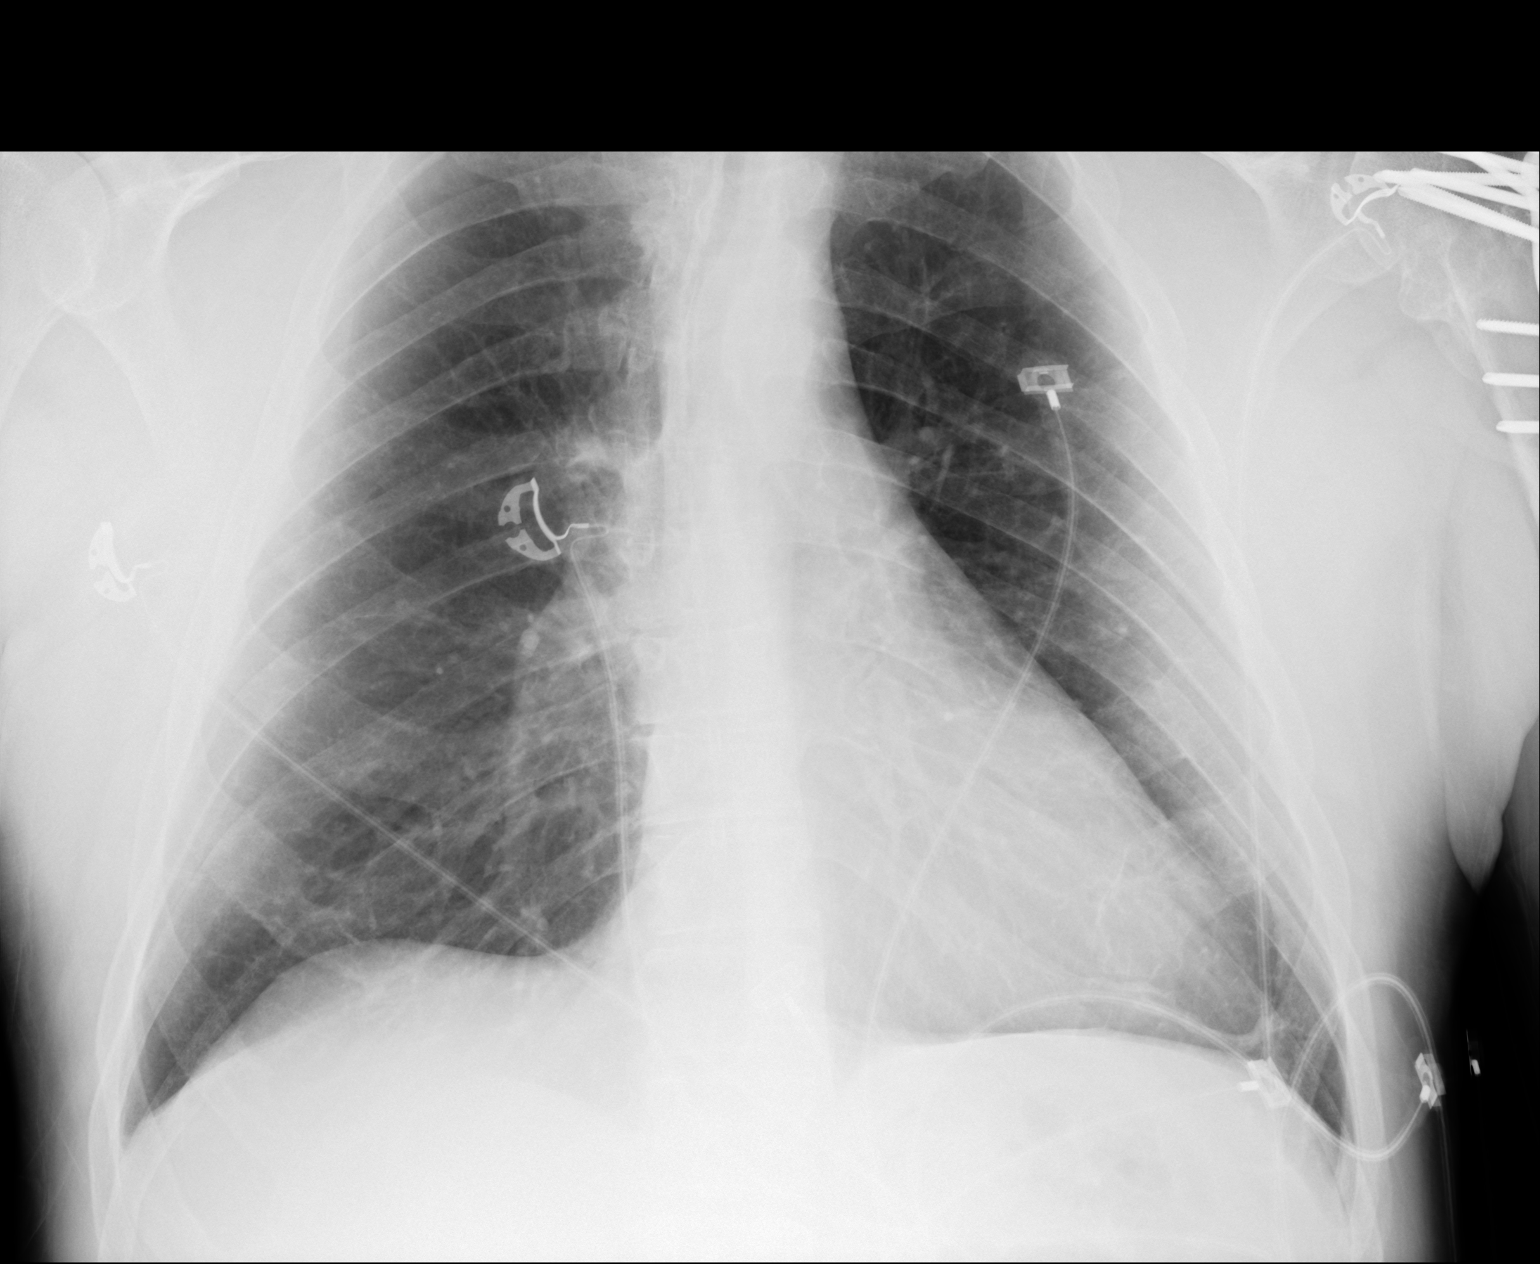

[1 of 1 positions shown; findings below may reference images not displayed]

FINDINGS: The lungs are clear. Heart size is normal. There is no pneumothorax
or pleural effusion. No focal bony abnormality is identified. Remote
left humerus fracture is identified with hardware in place.
IMPRESSION: No acute disease.

## 2016-02-20 IMAGING — NM NM MYOCAR MULTI W/SPECT W/WALL MOTION & EF
2 series · 12 of 12 positions shown · non-contrast
Comparison: none

[Series 1: rest · 8.28mm/px · 6 of 64 frames shown]
[frame 6/64]
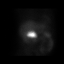
[frame 16/64]
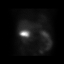
[frame 27/64]
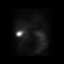
[frame 38/64]
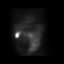
[frame 48/64]
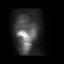
[frame 59/64]
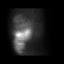

[Series 2: stress gated · 8.28mm/px · 6 of 64 frames shown]
[frame 6/64]
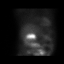
[frame 16/64]
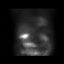
[frame 27/64]
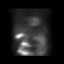
[frame 38/64]
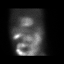
[frame 48/64]
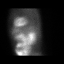
[frame 59/64]
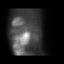

[12 of 12 positions shown; findings below may reference images not displayed]

This is a summary report. The complete report is available in the patient's medical record. If you cannot access the medical record, please contact the sending organization for a detailed fax or copy.

Nuclear History and Indications
History and Indications
63 year old male with history of HTN, hyperlipidemia, COPD, and tobacco abuse referred for ischemic evaluation with recent atypical chest pain.

Nuclear Stress Measurements
LV Systolic Volume 43 mL

TID

LV Diastolic Volume 99 mL

Nuc Stress EF 57 %

SSS 0

SRS 0

Stress Measurements
Baseline Vitals
Rest HR 62 bpm

Rest BP 125/66 mmHg

Exercise Time
Exercise duration (min) 6 min

Exercise duration (sec) 11 sec

Peak Stress Vitals
Post peak HR 146 bpm

Post peak BP 214/53 mmHg

Exercise Data
MPHR 159 bpm

Percent HR 91 %

Estimated workload 7 METS

Nuclear Stress Findings
Isotope administration
The isotope used for nuclear imaging was NcGGm Sestamibi. IV Site: left antecubital fossa.Rest isotope was administered on 03/25/2015 at [DATE] with an IV injection of 10 mCi. Rest SPECT images were obtained approximately 45 minutes post tracer injection.
Stress isotope was administered on 03/25/2015 at [DATE] with an IV injection of 30 mCi at peak stress Stress SPECT images were obtained approximately 30 minutes post tracer injection.
Nuclear Study Quality
Diaphragmatic attenuation artifact was present.
Overall image quality is good.
Nuclear Measurements
Study was gated.
Rest Perfusion
There is a defect present in the basal inferoseptal and basal inferior location.
Stress Perfusion
There is a defect present in the basal inferoseptal and basal inferior location.
Perfusion Summary
Defect 1:

There is a small defect of mild severity present in the basal inferoseptal and basal inferior location. The defect is non-reversible. Consistent with soft tissue attenuation rather than scar.
Study Impression
The study is normal. This is a low risk study. Overall left ventricular systolic function was normal. LV cavity size is normal. The left ventricular ejection fraction is normal (55-65%). There is no prior study for comparison.

From: OBA/REITANO/HARITHA/ALEXA/YOANDY/TIGER/ASHELY/GARNEV 9279 Appropriate Use Criteria for Coronary Revascularization Focused Update

Wall Scoring
Score Index: 1.000 Percent Normal: 100.0%

The left ventricular wall motion is normal.

Conclusion
Low risk study with evidence of diaphragmatic attenuation and no ischemic defects. LVEF 57%.

11
Tc Cardiolite Rest 11 mCi, Tc Cardiolite Stress 30 mCi, Treadmill, Cardiology to read

## 2016-06-07 ENCOUNTER — Emergency Department (HOSPITAL_COMMUNITY)
Admission: EM | Admit: 2016-06-07 | Discharge: 2016-06-07 | Disposition: A | Discharge status: Home / Self Care | Payer: Medicare Other | Attending: Emergency Medicine | Admitting: Emergency Medicine

## 2016-06-07 ENCOUNTER — Emergency Department (HOSPITAL_COMMUNITY): Payer: Medicare Other

## 2016-06-07 ENCOUNTER — Encounter (HOSPITAL_COMMUNITY): Payer: Self-pay

## 2016-06-07 DIAGNOSIS — R0602 Shortness of breath: Secondary | ICD-10-CM | POA: Insufficient documentation

## 2016-06-07 DIAGNOSIS — Z87891 Personal history of nicotine dependence: Secondary | ICD-10-CM | POA: Insufficient documentation

## 2016-06-07 DIAGNOSIS — Z7982 Long term (current) use of aspirin: Secondary | ICD-10-CM | POA: Insufficient documentation

## 2016-06-07 DIAGNOSIS — Z79899 Other long term (current) drug therapy: Secondary | ICD-10-CM | POA: Insufficient documentation

## 2016-06-07 DIAGNOSIS — I10 Essential (primary) hypertension: Secondary | ICD-10-CM | POA: Insufficient documentation

## 2016-06-07 DIAGNOSIS — J449 Chronic obstructive pulmonary disease, unspecified: Secondary | ICD-10-CM | POA: Insufficient documentation

## 2016-06-07 DIAGNOSIS — R42 Dizziness and giddiness: Secondary | ICD-10-CM | POA: Insufficient documentation

## 2016-06-07 LAB — I-STAT TROPONIN, ED
TROPONIN I, POC: 0 ng/mL (ref 0.00–0.08)
Troponin i, poc: 0 ng/mL (ref 0.00–0.08)

## 2016-06-07 LAB — URINALYSIS, ROUTINE W REFLEX MICROSCOPIC
BILIRUBIN URINE: NEGATIVE
Glucose, UA: NEGATIVE mg/dL
HGB URINE DIPSTICK: NEGATIVE
KETONES UR: NEGATIVE mg/dL
Leukocytes, UA: NEGATIVE
Nitrite: NEGATIVE
Protein, ur: NEGATIVE mg/dL
Specific Gravity, Urine: 1.022 (ref 1.005–1.030)
pH: 6.5 (ref 5.0–8.0)

## 2016-06-07 LAB — CBC
HCT: 45.4 % (ref 39.0–52.0)
Hemoglobin: 15.6 g/dL (ref 13.0–17.0)
MCH: 30.4 pg (ref 26.0–34.0)
MCHC: 34.4 g/dL (ref 30.0–36.0)
MCV: 88.5 fL (ref 78.0–100.0)
PLATELETS: 193 10*3/uL (ref 150–400)
RBC: 5.13 MIL/uL (ref 4.22–5.81)
RDW: 13.7 % (ref 11.5–15.5)
WBC: 6.5 10*3/uL (ref 4.0–10.5)

## 2016-06-07 LAB — BASIC METABOLIC PANEL
Anion gap: 6 (ref 5–15)
BUN: 21 mg/dL — ABNORMAL HIGH (ref 6–20)
CALCIUM: 9.6 mg/dL (ref 8.9–10.3)
CO2: 24 mmol/L (ref 22–32)
CREATININE: 1.37 mg/dL — AB (ref 0.61–1.24)
Chloride: 107 mmol/L (ref 101–111)
GFR calc non Af Amer: 53 mL/min — ABNORMAL LOW (ref >60)
GLUCOSE: 104 mg/dL — AB (ref 65–99)
Potassium: 4.7 mmol/L (ref 3.5–5.1)
Sodium: 137 mmol/L (ref 135–145)

## 2016-06-07 MED ORDER — ONDANSETRON HCL 4 MG PO TABS: 4.0000 mg | ORAL_TABLET | Freq: Four times a day (QID) | ORAL | Status: DC

## 2016-06-07 MED ORDER — IOPAMIDOL (ISOVUE-370) INJECTION 76%
INTRAVENOUS | Status: AC
  Administered 2016-06-07: 50 mL
  Filled 2016-06-07: qty 50

## 2016-06-07 MED ORDER — DICYCLOMINE HCL 20 MG PO TABS: 20.0000 mg | ORAL_TABLET | Freq: Two times a day (BID) | ORAL | Status: DC

## 2016-06-07 NOTE — ED Provider Notes (Signed)
CSN: [id number]     Arrival date & time 06/07/16  1110 History   First MD Initiated Contact with Patient 06/07/16 1133     Chief Complaint  Patient presents with  . Shortness of Breath  . Dizziness     (Consider location/radiation/quality/duration/timing/severity/associated sxs/prior Treatment) HPI Seth Savage is a 65 y.o. male with a history of hypertension, hypercholesterolemia, carotid artery occlusion with subsequent left endarterectomy 2009-followed by Dr. Donnetta Hutching, here for evaluation of shortness of breath and dizziness. Patient reports yesterday around 10:00 while driving, he experienced a palpitation that quickly resolved, but reports having difficulty seeing out of his left eye. Reports his vision was somewhat blurry and he had difficulty making out numbers and letters. This resolved some time yesterday evening. He reports today, again around 10:00, he was walking in [place] and became dizzy with shortness of breath and associated "head tingling". He also reports associated numbness in his bilateral upper extremities. He reports going home to lie down. He reports his symptoms mostly resolved except for the numbness in his hands. He denies any chest pain, fevers or chills, cough, leg swelling. He does report very mild left lower quadrant discomfort that he states is typical for his kidney stone discomfort. Denies any urinary symptoms, diarrhea or constipation. Took 2, 81 mg aspirin last night.  Past Medical History  Diagnosis Date  . Essential hypertension   . Hypercholesterolemia   . Goiter     Radioactive iodine treatment  . Carotid artery occlusion   . COPD (chronic obstructive pulmonary disease) ([place])   . Neuropathy ([place])   . H pylori ulcer Jan 2016    Treated with Prevpac  . Asthma     Childhood   Past Surgical History  Procedure Laterality Date  . Colonoscopy  08/24/2004    Normal rectum,Small polyp at 25 cm, cold snared/ The remainder of the colonic mucosa appeared  normal  . Rotator cuff repair Left 1992    Metal implant   . Rotator cuff repair Right 1999  . Colectomy  2000    Secondary to intussuception  . Back surgery  2003  . Shoulder surgery    . Multiple bilateral arm/wrist surgeries after falls    . Colonoscopy  09/24/2012    Dr. Mike Craze hemorrhoids-likely source of hematochezia.Colonic diverticulosis  . Spine surgery    . Carotid endarterectomy    . Esophagogastroduodenoscopy N/A 12/09/2014    Dr. Gala Romney: Erosive reflux esophagitis. Peptic Ulcer disease secondary to H.pylori. small hiatal hernia. Nonbleeding duodenal AVM. Duodenal erosions. TREATED WITH PREVPAC  . Esophagogastroduodenoscopy N/A 03/15/2015    Procedure: ESOPHAGOGASTRODUODENOSCOPY (EGD);  Surgeon: Daneil Dolin, MD;  Location: [facility];  Service: Endoscopy;  Laterality: N/A;  145pm   Family History  Problem Relation Age of Onset  . Colon cancer Neg Hx   . COPD Mother   . Mental illness Mother   . Hypertension Mother   . Varicose Veins Mother   . Heart attack Mother 20  . Hypertension Father   . Alcohol abuse Father    Social History  Substance Use Topics  . Smoking status: Former Smoker -- 0.20 packs/day for 45 years    Types: Cigarettes    Quit date: 08/25/2015  . Smokeless tobacco: Never Used  . Alcohol Use: 0.0 oz/week    0 Standard drinks or equivalent per week     Comment: 2 beers per month    Review of Systems A 10 point review of systems was completed and  was negative except for pertinent positives and negatives as mentioned in the history of present illness     Allergies  Lyrica and Codeine  Home Medications   Prior to Admission medications   Medication Sig Start Date End Date Taking? Authorizing Provider  aspirin EC 81 MG tablet Take 81 mg by mouth daily.    Historical Provider, MD  fluticasone (FLONASE) 50 MCG/ACT nasal spray Place 1 spray into both nostrils daily.     Historical Provider, MD  hydrochlorothiazide (HYDRODIURIL) 25 MG  tablet Take 12.5 mg by mouth at bedtime.  02/09/15   Historical Provider, MD  losartan (COZAAR) 100 MG tablet Take 100 mg by mouth at bedtime.  08/28/12   Historical Provider, MD  pantoprazole (PROTONIX) 40 MG tablet Take 1 tablet (40 mg total) by mouth daily. 09/06/15   Mahala Menghini, PA-C  SYNTHROID 150 MCG tablet Take 150 mcg by mouth every morning.  08/18/12   Historical Provider, MD  topiramate (TOPAMAX) 100 MG tablet Take 100 mg by mouth 2 (two) times daily as needed (migraines). Reported on 12/21/2015    Historical Provider, MD   BP 146/87 mmHg  Pulse 78  Temp(Src) 97.6 F (36.4 C) (Oral)  Resp 16  Ht 6' (1.829 m)  Wt 101.787 kg  BMI 30.43 kg/m2  SpO2 99% Physical Exam  Constitutional: He is oriented to person, place, and time. He appears well-developed and well-nourished. No distress.  HENT:  Head: Normocephalic and atraumatic.  Mouth/Throat: Oropharynx is clear and moist.  Eyes: Conjunctivae and EOM are normal. Pupils are equal, round, and reactive to light. Right eye exhibits no discharge. Left eye exhibits no discharge. No scleral icterus.  Neck: Normal range of motion. Neck supple. No JVD present.  Cardiovascular: Normal rate, regular rhythm and normal heart sounds.   Pulmonary/Chest: Effort normal and breath sounds normal. No respiratory distress. He has no wheezes. He has no rales.  Abdominal: Soft. There is no tenderness.  Musculoskeletal: Normal range of motion. He exhibits no edema or tenderness.  Neurological: He is alert and oriented to person, place, and time.  Cranial Nerves II-XII grossly intact. Motor strength 5/5 in all 4 extremities. Sensation intact to light touch. Gait is baseline. No ataxia. No pronator drift.  Skin: Skin is warm and dry. No rash noted. He is not diaphoretic.  Psychiatric: He has a normal mood and affect.  Nursing note and vitals reviewed.   ED Course  Procedures (including critical care time) Labs Review Labs Reviewed  BASIC METABOLIC  PANEL - Abnormal; Notable for the following:    Glucose, Bld 104 (*)    BUN 21 (*)    Creatinine, Ser 1.37 (*)    GFR calc non Af Amer 53 (*)    All other components within normal limits  CBC  URINALYSIS, ROUTINE W REFLEX MICROSCOPIC (NOT AT [facility])  I-STAT TROPOININ, ED  Randolm Idol, ED    Imaging Review Dg Chest 2 View  06/07/2016  CLINICAL DATA:  Chest tightness for 3 days EXAM: CHEST  2 VIEW COMPARISON:  None. FINDINGS: The heart size and mediastinal contours are within normal limits. Both lungs are clear. The visualized skeletal structures are unremarkable. IMPRESSION: No active cardiopulmonary disease. Electronically Signed   By: Kathreen Devoid   On: 06/07/2016 12:27   Ct Angio Neck W/cm &/or Wo/cm  06/07/2016  CLINICAL DATA:  65 y/o M; lightheaded today with blurry vision in the left high as well but shooting pains to the temples. Concern  for carotid stenosis. EXAM: CT ANGIOGRAPHY NECK TECHNIQUE: Multidetector CT imaging of the neck was performed using the standard protocol during bolus administration of intravenous contrast. Multiplanar CT image reconstructions and MIPs were obtained to evaluate the vascular anatomy. Carotid stenosis measurements (when applicable) are obtained utilizing NASCET criteria, using the distal internal carotid diameter as the denominator. CONTRAST:  50 cc Isovue 370 COMPARISON:  None. FINDINGS: Aortic arch: Mild calcific aortic atherosclerosis. Four vessel arch. Right carotid system: Densely calcified plaque of the carotid bifurcation with severe 80% stenosis series 4, image 81. The vessel is otherwise normally patent. Left carotid system: Fibrofatty plaque of the common carotid with short segment of mild to moderate 50% stenosis proximal to the bifurcation series 4, image 71. There is extensive fibro fatty plaque of the bifurcation without significant stenosis but with several small plaque ulcerations. No aneurysm or dissection. Vertebral arteries:No  hemodynamically significant stenosis. No aneurysm or dissection. 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 Visualized skull base is unremarkable. Visualized paranasal sinuses are clear except and small mucous retention cysts in the left maxillary sinus. Visualized orbits are unremarkable. No abnormal enhancement of visualized intracranial compartment. Multilevel degenerative changes of the spine. Other neck: Mild centrilobular and paraseptal emphysema of the lung apices. Right posterior upper lobe 4 mm nodule series 4, image 17. No lymphadenopathy. Salivary glands are unremarkable. The aerodigestive tract is unremarkable. Minimal residual thyroid, atrophy versus prior resection. IMPRESSION: 1. Right carotid bifurcation calcified plaque with severe 80% stenosis of the proximal internal carotid artery. 2. Fibrofatty plaque left carotid bifurcation with small plaque ulcerations. No hemodynamically significant stenosis. 3. No hemodynamically significant stenosis of the vertebral arteries. 4. No aneurysm or dissection is identified. 5. 4 mm right upper lobe pulmonary nodule. No follow-up needed if patient is low-risk. Non-contrast chest CT can be considered in 12 months if patient is high-risk. This recommendation follows the consensus statement: Guidelines for Management of Incidental Pulmonary Nodules Detected on CT Images:From the Fleischner Society 2017; published online before print (10.1148/radiol.IJ:2314499). 6. Mild lung apex emphysema.  Mild aortic atherosclerosis. Electronically Signed   By: Kristine Garbe M.D.   On: 06/07/2016 14:04   I have personally reviewed and evaluated these images and lab results as part of my medical decision-making.   EKG Interpretation None     ED ECG REPORT   Date: 06/07/2016  Rate: 75  Rhythm: normal sinus rhythm  QRS Axis: normal  Intervals: normal  ST/T Wave abnormalities: normal  Conduction  Disutrbances:none  Narrative Interpretation:   Old EKG Reviewed: unchanged  I have personally reviewed the EKG tracing and agree with the computerized printout as noted.  MDM  Patient with history of carotid artery stenosis, left endarterectomy 2009 here for evaluation of dizziness, shortness of breath, headache and some left eye vision blurriness. On arrival, he is hemodynamically stable, afebrile and appears very well and at baseline. States he feels much better in ED. He has a nonfocal neuro exam. Patient was supposed to follow up with Dr. Donnetta Hutching in June for reevaluation and subsequent CT angiogram of his neck. Given patient's history and that he is symptomatic today, will order CT angiogram. Pending basic labs, troponin. EKG is reassuring, delta troponin is negative. Clinical presentation not consistent with CVA, ICH. CT angiogram shows 80% stenosis in right internal carotid. Discussed with vascular surgery, Dr. Bridgett Larsson, recommend patient follow-up in clinic next week. Discussed results and return precautions with patient, he verbalizes understanding and  agrees with plan and subsequent discharge. Prior to patient discharge, I discussed and reviewed this case with Dr.Long  Final diagnoses:  Dizzy        Comer Locket, PA-C 06/07/16 [place], MD 06/07/16 1919

## 2016-06-07 NOTE — ED Notes (Signed)
Placed patient in a gown and on the monitor

## 2016-06-07 NOTE — Discharge Instructions (Signed)
There is not appear to be an emergent cause for your symptoms at this time. Your exam and labs are reassuring. CT scan of your neck did show a narrowing of your right carotid artery. It is important for you to follow-up with Dr. Donnetta Hutching for further evaluation of this. Please call his office tomorrow to set up an appointment. Return to ED for new or worsening symptoms as we discussed.  Dizziness Dizziness is a common problem. It is a feeling of unsteadiness or light-headedness. You may feel like you are about to faint. Dizziness can lead to injury if you stumble or fall. Anyone can become dizzy, but dizziness is more common in older adults. This condition can be caused by a number of things, including medicines, dehydration, or illness. HOME CARE INSTRUCTIONS Taking these steps may help with your condition: Eating and Drinking  Drink enough fluid to keep your urine clear or pale yellow. This helps to keep you from becoming dehydrated. Try to drink more clear fluids, such as water.  Do not drink alcohol.  Limit your caffeine intake if directed by your health care provider.  Limit your salt intake if directed by your health care provider. Activity  Avoid making quick movements.  Rise slowly from chairs and steady yourself until you feel okay.  In the morning, first sit up on the side of the bed. When you feel okay, stand slowly while you hold onto something until you know that your balance is fine.  Move your legs often if you need to stand in one place for a long time. Tighten and relax your muscles in your legs while you are standing.  Do not drive or operate heavy machinery if you feel dizzy.  Avoid bending down if you feel dizzy. Place items in your home so that they are easy for you to reach without leaning over. Lifestyle  Do not use any tobacco products, including cigarettes, chewing tobacco, or electronic cigarettes. If you need help quitting, ask your health care  provider.  Try to reduce your stress level, such as with yoga or meditation. Talk with your health care provider if you need help. General Instructions  Watch your dizziness for any changes.  Take medicines only as directed by your health care provider. Talk with your health care provider if you think that your dizziness is caused by a medicine that you are taking.  Tell a friend or a family member that you are feeling dizzy. If he or she notices any changes in your behavior, have this person call your health care provider.  Keep all follow-up visits as directed by your health care provider. This is important. SEEK MEDICAL CARE IF:  Your dizziness does not go away.  Your dizziness or light-headedness gets worse.  You feel nauseous.  You have reduced hearing.  You have new symptoms.  You are unsteady on your feet or you feel like the room is spinning. SEEK IMMEDIATE MEDICAL CARE IF:  You vomit or have diarrhea and are unable to eat or drink anything.  You have problems talking, walking, swallowing, or using your arms, hands, or legs.  You feel generally weak.  You are not thinking clearly or you have trouble forming sentences. It may take a friend or family member to notice this.  You have chest pain, abdominal pain, shortness of breath, or sweating.  Your vision changes.  You notice any bleeding.  You have a headache.  You have neck pain or a stiff neck.  You have a fever.   This information is not intended to replace advice given to you by your health care provider. Make sure you discuss any questions you have with your health care provider.   Document Released: 05/02/2001 Document Revised: 03/23/2015 Document Reviewed: 11/02/2014 Elsevier Interactive Patient Education [employer].

## 2016-06-07 NOTE — ED Notes (Signed)
Patient transported to CT 

## 2016-06-07 NOTE — ED Notes (Addendum)
Patient here with chest discomfort while driving yesterday that resolved had dizziness yesterday. While walking at [employer] this am developed dizziness and exertional shortness of breath. Now since getting home ongoing shortness of breath, headache, nausea with same.  Also complains of LLQ discomfort for the past 1 week.

## 2016-06-08 ENCOUNTER — Encounter: Payer: Self-pay | Admitting: Vascular Surgery

## 2016-06-08 ENCOUNTER — Telehealth: Payer: Self-pay | Admitting: Vascular Surgery

## 2016-06-08 NOTE — Telephone Encounter (Signed)
Spoke w/ wife to resch appt to 7/25 at 9:45.

## 2016-06-08 NOTE — Telephone Encounter (Signed)
-----   Message from Mena Goes, RN sent at 06/08/2016 10:28 AM EDT ----- Regarding: Can we move up appt from 06-28-16 per [facility], Appt is for TFE    ----- Message -----    From: Conrad [place], MD    Sent: 06/07/2016   3:47 PM      To: [facility] [address]  Seth Savage [id number] [birth year]  Pt should be already scheduled to see Dr. Donnetta Hutching this month.  If not, schedule him for this month.  CTA neck was completed in the ED recently.

## 2016-06-13 ENCOUNTER — Encounter: Payer: Self-pay | Admitting: Vascular Surgery

## 2016-06-13 ENCOUNTER — Ambulatory Visit (INDEPENDENT_AMBULATORY_CARE_PROVIDER_SITE_OTHER): Payer: Medicare Other | Admitting: Vascular Surgery

## 2016-06-13 ENCOUNTER — Other Ambulatory Visit: Payer: Self-pay

## 2016-06-13 VITALS — BP 133/86 | HR 80 | Temp 98.5°F | Resp 16 | Ht 72.0 in | Wt 223.0 lb

## 2016-06-13 DIAGNOSIS — I6523 Occlusion and stenosis of bilateral carotid arteries: Secondary | ICD-10-CM | POA: Diagnosis not present

## 2016-06-13 NOTE — Progress Notes (Signed)
Vascular and Vein Specialist of [facility]  Patient name: Seth Savage MRN: [id number] DOB: December 30, 1950 Sex: male  REASON FOR VISIT: [place] of carotid disease  HPI: LUISMIGUEL Savage is a 65 y.o. male seen today for follow-up of carotid disease. Well-known to me from prior endarterectomy in 2009 for severe asymptomatic left carotid stenosis. He has known to have a moderate to severe right stenosis which is been progressive over time. He continues to deny any specific neurologic deficits. No amaurosis fugax, transient ischemic attack or stroke. He reports that over the last month or so he has been having episodes of diaphoresis and tingling in both arms and shortness of breath with minimal exertion. No specific chest pain. No prior history of cardiac disease. Has quit smoking and has better respiratory capacity following this.  Past Medical History:  Diagnosis Date  . Asthma    Childhood  . Carotid artery occlusion   . COPD (chronic obstructive pulmonary disease) ([place])   . Essential hypertension   . Goiter    Radioactive iodine treatment  . H pylori ulcer Jan 2016   Treated with Prevpac  . Hypercholesterolemia   . Neuropathy ([place])     Family History  Problem Relation Age of Onset  . Colon cancer Neg Hx   . COPD Mother   . Mental illness Mother   . Hypertension Mother   . Varicose Veins Mother   . Heart attack Mother 27  . Hypertension Father   . Alcohol abuse Father     SOCIAL HISTORY: Social History  Substance Use Topics  . Smoking status: Former Smoker    Packs/day: 0.20    Years: 45.00    Types: Cigarettes    Quit date: 08/25/2015  . Smokeless tobacco: Never Used  . Alcohol use 0.0 oz/week     Comment: 2 beers per month    Allergies  Allergen Reactions  . Lyrica [Pregabalin] Shortness Of Breath  . Codeine Rash    Current Outpatient Prescriptions  Medication Sig Dispense Refill  . aspirin EC 81 MG tablet Take 81 mg by  mouth daily.    . fluticasone (FLONASE) 50 MCG/ACT nasal spray Place 1 spray into both nostrils daily.     . hydrochlorothiazide (HYDRODIURIL) 25 MG tablet Take 12.5 mg by mouth at bedtime.     Marland Kitchen losartan (COZAAR) 100 MG tablet Take 100 mg by mouth at bedtime.     . pantoprazole (PROTONIX) 40 MG tablet Take 1 tablet (40 mg total) by mouth daily. 30 tablet 11  . SYNTHROID 150 MCG tablet Take 150 mcg by mouth every morning.     . topiramate (TOPAMAX) 100 MG tablet Take 100 mg by mouth 2 (two) times daily as needed (migraines). Reported on 12/21/2015     No current facility-administered medications for this visit.     REVIEW OF SYSTEMS:  [X]  denotes positive finding, [ ]  denotes negative finding Cardiac  Comments:  Chest pain or chest pressure:    Shortness of breath upon exertion: x   Short of breath when lying flat:    Irregular heart rhythm:        Vascular    Pain in calf, thigh, or hip brought on by ambulation:    Pain in feet at night that wakes you up from your sleep:     Blood clot in your veins:    Leg swelling:  x       Pulmonary    Oxygen at home:  Productive cough:     Wheezing:         Neurologic    Sudden weakness in arms or legs:     Sudden numbness in arms or legs:  x   Sudden onset of difficulty speaking or slurred speech:    Temporary loss of vision in one eye:     Problems with dizziness:  x       Gastrointestinal    Blood in stool:     Vomited blood:         Genitourinary    Burning when urinating:     Blood in urine:        Psychiatric    Major depression:         Hematologic    Bleeding problems:    Problems with blood clotting too easily:        Skin    Rashes or ulcers:        Constitutional    Fever or chills:      PHYSICAL EXAM: Vitals:   06/13/16 0945 06/13/16 0948  BP: 135/89 133/86  Pulse: 80   Resp: 16   Temp: 98.5 F (36.9 C)   TempSrc: Oral   SpO2: 97%   Weight: 223 lb (101.2 kg)   Height: 6' (1.829 m)     GENERAL:  The patient is a well-nourished male, in no acute distress. The vital signs are documented above. CARDIAC: There is a regular rate and rhythm.  VASCULAR: 2+ left radial pulse. Absent right radial pulse related to trauma. Palpable posterior tibial pulses bilaterally PULMONARY: There is good air exchange bilaterally without wheezing or rales. ABDOMEN: Soft and non-tender    MUSCULOSKELETAL: There are no major deformities or cyanosis. NEUROLOGIC: No focal weakness or paresthesias are detected. SKIN: There are no ulcers or rashes noted. PSYCHIATRIC: The patient has a normal affect.  DATA:  CT angiogram was reviewed with the patient. This reveals at least 80% stenosis in his right carotid bifurcation. Widely patent endarterectomy on the left.  MEDICAL ISSUES: Severe asymptomatic right carotid stenosis. Have recommended endarterectomy for reduction of stroke risk. Explained that admission on the day of surgery with expected overnight circumflex I stay and discharged on postoperative day 1. Am concerned regarding his recent diaphoresis and shortness of breath with exertion. Will schedule a cardiology evaluation in [place] preoperatively. Once this is been satisfied will be scheduled for elective surgery. Discussed 12% risk of stroke with surgery.    Rosetta Posner, MD FACS Vascular and Vein Specialists of [facility] Tel [phone] Pager [phone]

## 2016-06-28 ENCOUNTER — Ambulatory Visit (INDEPENDENT_AMBULATORY_CARE_PROVIDER_SITE_OTHER): Payer: Medicare Other | Admitting: Cardiovascular Disease

## 2016-06-28 ENCOUNTER — Encounter: Payer: Self-pay | Admitting: *Deleted

## 2016-06-28 ENCOUNTER — Ambulatory Visit: Payer: Medicare Other | Admitting: Vascular Surgery

## 2016-06-28 ENCOUNTER — Telehealth: Payer: Self-pay | Admitting: Cardiovascular Disease

## 2016-06-28 ENCOUNTER — Encounter (HOSPITAL_COMMUNITY): Payer: Self-pay

## 2016-06-28 ENCOUNTER — Encounter: Payer: Self-pay | Admitting: Cardiovascular Disease

## 2016-06-28 VITALS — BP 148/64 | HR 62 | Ht 72.0 in | Wt 224.0 lb

## 2016-06-28 DIAGNOSIS — I6523 Occlusion and stenosis of bilateral carotid arteries: Secondary | ICD-10-CM

## 2016-06-28 DIAGNOSIS — R0609 Other forms of dyspnea: Secondary | ICD-10-CM | POA: Diagnosis not present

## 2016-06-28 DIAGNOSIS — Z01818 Encounter for other preprocedural examination: Secondary | ICD-10-CM | POA: Diagnosis not present

## 2016-06-28 DIAGNOSIS — K829 Disease of gallbladder, unspecified: Secondary | ICD-10-CM

## 2016-06-28 DIAGNOSIS — R079 Chest pain, unspecified: Secondary | ICD-10-CM

## 2016-06-28 DIAGNOSIS — I10 Essential (primary) hypertension: Secondary | ICD-10-CM

## 2016-06-28 NOTE — Patient Instructions (Signed)
Medication Instructions:   Continue all current medications.  Labwork:  None.  Testing/Procedures:  Your physician has requested that you have an echocardiogram. Echocardiography is a painless test that uses sound waves to create images of your heart. It provides your doctor with information about the size and shape of your heart and how well your heart's chambers and valves are working. This procedure takes approximately one hour. There are no restrictions for this procedure.  Your physician has requested that you have en exercise stress myoview. For further information please visit [email or web address]. Please follow instruction sheet, as given.  Office will contact with results via phone or letter.    Follow-Up:  To be determined based on test results.   Any Other Special Instructions Will Be Listed Below (If Applicable).  If you need a refill on your cardiac medications before your next appointment, please call your pharmacy.

## 2016-06-28 NOTE — Telephone Encounter (Signed)
Exercise cardiolite & Echo  DX for both - chest pain & DOE  Scheduled for 06-30-16 at [facility]. Checking percert

## 2016-06-28 NOTE — Pre-Procedure Instructions (Signed)
    Seth Savage  06/28/2016      [facility], [place] - [id number] [place] [address] [place] [address] Phone: [phone] Fax: [phone]    Your procedure is scheduled on Friday, August 18th, 2017  Report to [facility] Admitting at 5:30 A.M.   Call this number if you have problems the morning of surgery:  [phone]   Remember:  Do not eat food or drink liquids after midnight.   Take these medicines the morning of surgery with A SIP OF WATER: Aspirin, fluticasone (flonase), pantoprazole (protonix), synthroid, and topiramate (topamax) if needed   Do not wear jewelry.  Do not wear lotions, powders, or colognes.  You may NOT wear deoderant.   Men may shave face and neck.  Do not bring valuables to the hospital.  [facility] is not responsible for any belongings or valuables.  Contacts, dentures or bridgework may not be worn into surgery.  Leave your suitcase in the car.  After surgery it may be brought to your room.  For patients admitted to the hospital, discharge time will be determined by your treatment team.  Patients discharged the day of surgery will not be allowed to drive home.    Special instructions:   [place]- Preparing For Surgery  Before surgery, you can play an important role. Because skin is not sterile, your skin needs to be as free of germs as possible. You can reduce the number of germs on your skin by washing with CHG (chlorahexidine gluconate) Soap before surgery.  CHG is an antiseptic cleaner which kills germs and bonds with the skin to continue killing germs even after washing.  Please do not use if you have an allergy to CHG or antibacterial soaps. If your skin becomes reddened/irritated stop using the CHG.  Do not shave (including legs and underarms) for at least 48 hours prior to first CHG shower. It is OK to shave your face.  Please follow these instructions carefully.   1. Shower the NIGHT  BEFORE SURGERY and the MORNING OF SURGERY with CHG.   2. If you chose to wash your hair, wash your hair first as usual with your normal shampoo.  3. After you shampoo, rinse your hair and body thoroughly to remove the shampoo.  4. Use CHG as you would any other liquid soap. You can apply CHG directly to the skin and wash gently with a scrungie or a clean washcloth.   5. Apply the CHG Soap to your body ONLY FROM THE NECK DOWN.  Do not use on open wounds or open sores. Avoid contact with your eyes, ears, mouth and genitals (private parts). Wash genitals (private parts) with your normal soap.  6. Wash thoroughly, paying special attention to the area where your surgery will be performed.  7. Thoroughly rinse your body with warm water from the neck down.  8. DO NOT shower/wash with your normal soap after using and rinsing off the CHG Soap.  9. Pat yourself dry with a CLEAN TOWEL.   10. Wear CLEAN PAJAMAS   11. Place CLEAN SHEETS on your bed the night of your first shower and DO NOT SLEEP WITH PETS.  Day of Surgery: Do not apply any deodorants/lotions. Please wear clean clothes to the hospital/surgery center.    Please read over the following fact sheets that you were given. MRSA Information

## 2016-06-28 NOTE — Progress Notes (Signed)
CARDIOLOGY CONSULT NOTE  Patient ID: SCHON ETHEREDGE MRN: [id number] DOB/AGE: Nov 03, 1951 65 y.o.  Admit date: (Not on file) Primary Physician: Glenda Chroman, MD Referring Physician: Early MD  Reason for Consultation: preop risk stratification  HPI: The patient is a 65 year old male who is referred for preoperative risk stratification prior to undergoing right carotid endarterectomy. He has a history of left carotid endarterectomy in 2009. He has no history of CVA. He does have hypertension, hyperlipidemia, and GERD.  He has had some recent diaphoresis and shortness of breath with exertion.  He underwent a low risk stress test in May 2016. There was basal inferoseptal and basal inferior soft tissue attenuation artifact with no obvious ischemia or scar. He was evaluated by Dr. Domenic Polite at that time.  ECG which I personally interpreted performed on 06/07/16 demonstrated normal sinus rhythm with no ischemic ST segment or T-wave abnormalities, nor any arrhythmias.  For the past 2 months he has experienced more exertional dyspnea. He does have COPD and quit smoking in November 2016. He has also felt more fatigued. He has had occasional chest pains different from the ones he experienced in May 2016. He also experiences intermittent palpitations.  He said he also has gallbladder disease and his stomach distends and he has discomfort leading to shortness of breath after eating certain foods.  He is statin intolerant as it causes myalgias.   Allergies  Allergen Reactions  . Lyrica [Pregabalin] Shortness Of Breath    Current Outpatient Prescriptions  Medication Sig Dispense Refill  . aspirin EC 81 MG tablet Take 81 mg by mouth daily.    . fluticasone (FLONASE) 50 MCG/ACT nasal spray Place 1 spray into both nostrils daily as needed for allergies.     . hydrochlorothiazide (HYDRODIURIL) 25 MG tablet Take 25 mg by mouth at bedtime.     Marland Kitchen losartan (COZAAR) 100 MG tablet Take 100 mg by  mouth at bedtime.     . pantoprazole (PROTONIX) 40 MG tablet Take 1 tablet (40 mg total) by mouth daily. 30 tablet 11  . SYNTHROID 150 MCG tablet Take 150 mcg by mouth every morning.     . topiramate (TOPAMAX) 100 MG tablet Take 100 mg by mouth 2 (two) times daily as needed (migraines). Reported on 12/21/2015     No current facility-administered medications for this visit.     Past Medical History:  Diagnosis Date  . Asthma    Childhood  . Carotid artery occlusion   . COPD (chronic obstructive pulmonary disease) ([place])   . Essential hypertension   . Goiter    Radioactive iodine treatment  . H pylori ulcer Jan 2016   Treated with Prevpac  . Hypercholesterolemia   . Neuropathy [facility])     Past Surgical History:  Procedure Laterality Date  . BACK SURGERY  2003  . CAROTID ENDARTERECTOMY    . COLECTOMY  2000   Secondary to intussuception  . COLONOSCOPY  08/24/2004   Normal rectum,Small polyp at 25 cm, cold snared/ The remainder of the colonic mucosa appeared normal  . COLONOSCOPY  09/24/2012   Dr. Mike Craze hemorrhoids-likely source of hematochezia.Colonic diverticulosis  . ESOPHAGOGASTRODUODENOSCOPY N/A 12/09/2014   Dr. Gala Romney: Erosive reflux esophagitis. Peptic Ulcer disease secondary to H.pylori. small hiatal hernia. Nonbleeding duodenal AVM. Duodenal erosions. TREATED WITH PREVPAC  . ESOPHAGOGASTRODUODENOSCOPY N/A 03/15/2015   Procedure: ESOPHAGOGASTRODUODENOSCOPY (EGD);  Surgeon: Daneil Dolin, MD;  Location: [facility];  Service: Endoscopy;  Laterality: N/A;  145pm  . Multiple bilateral arm/wrist surgeries after falls    . ROTATOR CUFF REPAIR Left 1992   Metal implant   . ROTATOR CUFF REPAIR Right 1999  . SHOULDER SURGERY    . SPINE SURGERY      Social History   Social History  . Marital status: Married    Spouse name: [name]  . Number of children: 0  . Years of education: Ged   Occupational History  .      Disabled   Social History Main Topics  . Smoking  status: Former Smoker    Packs/day: 0.20    Years: 45.00    Types: Cigarettes    Quit date: 08/25/2015  . Smokeless tobacco: Never Used  . Alcohol use 0.0 oz/week     Comment: 2 beers per month  . Drug use: No  . Sexual activity: Not on file   Other Topics Concern  . Not on file   Social History Narrative   Patient lives at home with his wife. [facility]).    Disabled.   Right handed.   Caffeine- two cups daily.   Three step children.     No family history of premature CAD in 1st degree relatives.  Prior to Admission medications   Medication Sig Start Date End Date Taking? Authorizing Provider  aspirin EC 81 MG tablet Take 81 mg by mouth daily.   Yes Historical Provider, MD  fluticasone (FLONASE) 50 MCG/ACT nasal spray Place 1 spray into both nostrils daily as needed for allergies.    Yes Historical Provider, MD  hydrochlorothiazide (HYDRODIURIL) 25 MG tablet Take 25 mg by mouth at bedtime.  02/09/15  Yes Historical Provider, MD  losartan (COZAAR) 100 MG tablet Take 100 mg by mouth at bedtime.  08/28/12  Yes Historical Provider, MD  pantoprazole (PROTONIX) 40 MG tablet Take 1 tablet (40 mg total) by mouth daily. 09/06/15  Yes Mahala Menghini, PA-C  SYNTHROID 150 MCG tablet Take 150 mcg by mouth every morning.  08/18/12  Yes Historical Provider, MD  topiramate (TOPAMAX) 100 MG tablet Take 100 mg by mouth 2 (two) times daily as needed (migraines). Reported on 12/21/2015   Yes Historical Provider, MD     Review of systems complete and found to be negative unless listed above in HPI     Physical exam Blood pressure (!) 148/64, pulse 62, height 6' (1.829 m), weight 224 lb (101.6 kg), SpO2 97 %. General: NAD Neck: No JVD, no thyromegaly or thyroid nodule.  Lungs: Clear to auscultation bilaterally with normal respiratory effort. CV: Nondisplaced PMI. Regular rate and rhythm, normal S1/S2, no S3/S4, no murmur.  No peripheral edema.    Abdomen: Soft, nontender  Skin: Intact without  lesions or rashes.  Neurologic: Alert and oriented x 3.  Psych: Normal affect. Extremities: No clubbing or cyanosis.  HEENT: Normal.   ECG: Most recent ECG reviewed.  Labs:   Lab Results  Component Value Date   WBC 6.5 06/07/2016   HGB 15.6 06/07/2016   HCT 45.4 06/07/2016   MCV 88.5 06/07/2016   PLT 193 06/07/2016   No results for input(s): NA, K, CL, CO2, BUN, CREATININE, CALCIUM, PROT, BILITOT, ALKPHOS, ALT, AST, GLUCOSE in the last 168 hours.  Invalid input(s): LABALBU Lab Results  Component Value Date   TROPONINI <0.03 03/23/2015   No results found for: CHOL No results found for: HDL No results found for: LDLCALC No results found for: TRIG No results found for: CHOLHDL No results  found for: LDLDIRECT       Studies: No results found.  ASSESSMENT AND PLAN:  1. Chest pain/DOE/diaphoresis/preop risk stratification: He has risk factors for ischemic heart disease including peripheral vascular disease and a long history of tobacco abuse with consequent COPD. He has exertional symptoms but both COPD and gallbladder disease can have overlapping symptoms. Regardless, I will proceed with exercise nuclear stress testing and echocardiography for more accurate preoperative risk stratification. Continue aspirin 81 mg daily. He is intolerant of statin therapy as it leads to myalgias.  2. Essential HTN: Elevated. Needs further monitoring. No changes today.  3. Gallbladder disease: Needs imaging and fu with general surgery.  Disposition: Follow-up to be determined.   Time spent: 40 minutes, of which greater than 50% was spent reviewing symptoms, relevant blood tests and studies, and discussing management plan with the patient.    Signed: Kate Sable, M.D., F.A.C.C.  06/28/2016, 10:34 AM

## 2016-06-29 ENCOUNTER — Encounter (HOSPITAL_COMMUNITY)
Admission: RE | Admit: 2016-06-29 | Discharge: 2016-06-29 | Disposition: A | Discharge status: Home / Self Care | Payer: Medicare Other | Source: Ambulatory Visit | Attending: Vascular Surgery | Admitting: Vascular Surgery

## 2016-06-29 ENCOUNTER — Encounter (HOSPITAL_COMMUNITY): Payer: Self-pay

## 2016-06-29 DIAGNOSIS — Z0183 Encounter for blood typing: Secondary | ICD-10-CM | POA: Diagnosis not present

## 2016-06-29 DIAGNOSIS — I6521 Occlusion and stenosis of right carotid artery: Secondary | ICD-10-CM | POA: Diagnosis not present

## 2016-06-29 DIAGNOSIS — Z01818 Encounter for other preprocedural examination: Secondary | ICD-10-CM | POA: Diagnosis not present

## 2016-06-29 DIAGNOSIS — E049 Nontoxic goiter, unspecified: Secondary | ICD-10-CM | POA: Insufficient documentation

## 2016-06-29 DIAGNOSIS — J449 Chronic obstructive pulmonary disease, unspecified: Secondary | ICD-10-CM | POA: Diagnosis not present

## 2016-06-29 DIAGNOSIS — Z7982 Long term (current) use of aspirin: Secondary | ICD-10-CM | POA: Diagnosis not present

## 2016-06-29 DIAGNOSIS — E785 Hyperlipidemia, unspecified: Secondary | ICD-10-CM | POA: Insufficient documentation

## 2016-06-29 DIAGNOSIS — Z87891 Personal history of nicotine dependence: Secondary | ICD-10-CM | POA: Diagnosis not present

## 2016-06-29 DIAGNOSIS — Z79899 Other long term (current) drug therapy: Secondary | ICD-10-CM | POA: Insufficient documentation

## 2016-06-29 DIAGNOSIS — Z01812 Encounter for preprocedural laboratory examination: Secondary | ICD-10-CM | POA: Diagnosis not present

## 2016-06-29 DIAGNOSIS — I10 Essential (primary) hypertension: Secondary | ICD-10-CM | POA: Insufficient documentation

## 2016-06-29 HISTORY — DX: Personal history of other diseases of the respiratory system: Z87.09

## 2016-06-29 HISTORY — DX: Reserved for inherently not codable concepts without codable children: IMO0001

## 2016-06-29 HISTORY — DX: Presence of spectacles and contact lenses: Z97.3

## 2016-06-29 HISTORY — DX: Unspecified osteoarthritis, unspecified site: M19.90

## 2016-06-29 HISTORY — DX: Personal history of urinary calculi: Z87.442

## 2016-06-29 LAB — URINALYSIS, ROUTINE W REFLEX MICROSCOPIC
BILIRUBIN URINE: NEGATIVE
GLUCOSE, UA: NEGATIVE mg/dL
HGB URINE DIPSTICK: NEGATIVE
Ketones, ur: NEGATIVE mg/dL
Leukocytes, UA: NEGATIVE
Nitrite: NEGATIVE
PH: 6 (ref 5.0–8.0)
Protein, ur: NEGATIVE mg/dL
SPECIFIC GRAVITY, URINE: 1.023 (ref 1.005–1.030)

## 2016-06-29 LAB — CBC
HEMATOCRIT: 47.3 % (ref 39.0–52.0)
HEMOGLOBIN: 16 g/dL (ref 13.0–17.0)
MCH: 30.4 pg (ref 26.0–34.0)
MCHC: 33.8 g/dL (ref 30.0–36.0)
MCV: 89.8 fL (ref 78.0–100.0)
Platelets: 189 10*3/uL (ref 150–400)
RBC: 5.27 MIL/uL (ref 4.22–5.81)
RDW: 13.3 % (ref 11.5–15.5)
WBC: 7 10*3/uL (ref 4.0–10.5)

## 2016-06-29 LAB — COMPREHENSIVE METABOLIC PANEL
ALK PHOS: 94 U/L (ref 38–126)
ALT: 28 U/L (ref 17–63)
ANION GAP: 10 (ref 5–15)
AST: 16 U/L (ref 15–41)
Albumin: 4.3 g/dL (ref 3.5–5.0)
BILIRUBIN TOTAL: 0.4 mg/dL (ref 0.3–1.2)
BUN: 19 mg/dL (ref 6–20)
CALCIUM: 9.4 mg/dL (ref 8.9–10.3)
CO2: 27 mmol/L (ref 22–32)
Chloride: 101 mmol/L (ref 101–111)
Creatinine, Ser: 1.19 mg/dL (ref 0.61–1.24)
Glucose, Bld: 111 mg/dL — ABNORMAL HIGH (ref 65–99)
Potassium: 4.1 mmol/L (ref 3.5–5.1)
Sodium: 138 mmol/L (ref 135–145)
TOTAL PROTEIN: 7.2 g/dL (ref 6.5–8.1)

## 2016-06-29 LAB — SURGICAL PCR SCREEN
MRSA, PCR: NEGATIVE
Staphylococcus aureus: NEGATIVE

## 2016-06-29 LAB — TYPE AND SCREEN
ABO/RH(D): A POS
ANTIBODY SCREEN: NEGATIVE

## 2016-06-29 LAB — PROTIME-INR
INR: 0.9
PROTHROMBIN TIME: 12.2 s (ref 11.4–15.2)

## 2016-06-29 LAB — APTT: aPTT: 26 seconds (ref 24–36)

## 2016-06-29 NOTE — Progress Notes (Signed)
PCP - Dr. Cletis Media Cardiologist - Dr. Bronson Ing  EKG - 06/07/16 CXR - 06/07/16  Echo/Stress test - pending - scheduled for 06/30/16 Cardiac Cath - denies  Patient denies chest pain at PAT appointment.  Patient states that he has had recent shortness of breath with exertion and has seen his cardiologist for issue.  Cardiac clearance is pending.

## 2016-06-29 NOTE — Progress Notes (Signed)
   06/29/16 0928  OBSTRUCTIVE SLEEP APNEA  Have you ever been diagnosed with sleep apnea through a sleep study? No  Do you snore loudly (loud enough to be heard through closed doors)?  1  Do you often feel tired, fatigued, or sleepy during the daytime (such as falling asleep during driving or talking to someone)? 0  Has anyone observed you stop breathing during your sleep? 0  Do you have, or are you being treated for high blood pressure? 1  BMI more than 35 kg/m2? 0  Age > 50 (1-yes) 1  Neck circumference greater than:Male 16 inches or larger, Male 17inches or larger? 1  Male Gender (Yes=1) 1  Obstructive Sleep Apnea Score 5

## 2016-06-30 ENCOUNTER — Encounter (HOSPITAL_COMMUNITY)
Admission: RE | Admit: 2016-06-30 | Discharge: 2016-06-30 | Disposition: A | Discharge status: Home / Self Care | Payer: Medicare Other | Source: Ambulatory Visit | Attending: Cardiovascular Disease | Admitting: Cardiovascular Disease

## 2016-06-30 ENCOUNTER — Encounter (HOSPITAL_COMMUNITY): Payer: Self-pay

## 2016-06-30 ENCOUNTER — Inpatient Hospital Stay (HOSPITAL_COMMUNITY): Admission: RE | Admit: 2016-06-30 | Payer: Medicare Other | Source: Ambulatory Visit

## 2016-06-30 DIAGNOSIS — R0609 Other forms of dyspnea: Secondary | ICD-10-CM

## 2016-06-30 DIAGNOSIS — I510 Cardiac septal defect, acquired: Secondary | ICD-10-CM | POA: Insufficient documentation

## 2016-06-30 DIAGNOSIS — R079 Chest pain, unspecified: Secondary | ICD-10-CM | POA: Diagnosis not present

## 2016-06-30 DIAGNOSIS — R931 Abnormal findings on diagnostic imaging of heart and coronary circulation: Secondary | ICD-10-CM | POA: Diagnosis not present

## 2016-06-30 LAB — NM MYOCAR MULTI W/SPECT W/WALL MOTION / EF
CHL CUP NUCLEAR SSS: 0
CHL CUP RESTING HR STRESS: 63 {beats}/min
CHL RATE OF PERCEIVED EXERTION: 17
Estimated workload: 7 METS
Exercise duration (min): 4 min
Exercise duration (sec): 10 s
LV dias vol: 104 mL (ref 62–150)
LVSYSVOL: 50 mL
MPHR: 157 {beats}/min
NUC STRESS TID: 1.09
Peak HR: 141 {beats}/min
Percent HR: 89 %
RATE: 0.3
SDS: 0
SRS: 0

## 2016-06-30 MED ORDER — SODIUM CHLORIDE 0.9% FLUSH
INTRAVENOUS | Status: AC
  Administered 2016-06-30: 10 mL via INTRAVENOUS
  Filled 2016-06-30: qty 10

## 2016-06-30 MED ORDER — REGADENOSON 0.4 MG/5ML IV SOLN
INTRAVENOUS | Status: AC
  Filled 2016-06-30: qty 5

## 2016-06-30 MED ORDER — TECHNETIUM TC 99M TETROFOSMIN IV KIT
30.0000 | PACK | Freq: Once | INTRAVENOUS | Status: AC | PRN
  Administered 2016-06-30: 31 via INTRAVENOUS

## 2016-06-30 MED ORDER — TECHNETIUM TC 99M TETROFOSMIN IV KIT
10.0000 | PACK | Freq: Once | INTRAVENOUS | Status: AC | PRN
  Administered 2016-06-30: 11 via INTRAVENOUS

## 2016-06-30 NOTE — Progress Notes (Addendum)
Anesthesia Chart Review:  Pt is a 65 year old male scheduled for R CEA on 07/07/2016 with Curt Jews, MD.   PCP is Jerene Bears, MD.   PMH includes:  HTN, COPD, hyperlipidemia, goiter. Former smoker. BMI 30.5  Medications include: ASA, hctz, losartan, protonix, synthroid.   Preoperative labs reviewed.    Chest x-ray 06/07/16: No active cardiopulmonary disease  EKG 06/07/16: NSR.   Echo 07/05/16:   Nuclear stress test 06/30/16:   Carotid duplex 12/21/15:  1. 60-79% stenosis R ICA 2. 40% stenosis L ICA  Will revisit chart when echo and stress results available.   Willeen Cass, FNP-BC [facility]/Anesthesiology Phone: [phone] 06/30/2016 4:10 PM  Addendum:  Pt has stress test 06/30/16 that was abnormal. Subsequently underwent cardiac cath that showed mild non-obstructive CAD. Pt has cardiac clearance for surgery from Jory Sims, NP.   Cardiac cath   Prox RCA lesion, 10 %stenosed.  Ost RPDA lesion, 20 %stenosed.  Dist RCA lesion, 20 %stenosed.  1st Mrg lesion, 30 %stenosed.  Prox LAD to Mid LAD lesion, 20 %stenosed.  There is no mitral valve regurgitation.  The left ventricular systolic function is normal.  LV end diastolic pressure is normal.  The left ventricular ejection fraction is 50-55% by visual estimate. 1. Mild non-obstructive CAD 2. Low normal LV systolic function  If no changes, I anticipate pt can proceed with surgery as scheduled.   Willeen Cass, FNP-BC [facility]/Anesthesiology Phone: [phone] 07/06/2016 2:48 PM

## 2016-07-03 ENCOUNTER — Ambulatory Visit: Payer: Medicare Other | Admitting: Cardiovascular Disease

## 2016-07-03 ENCOUNTER — Telehealth: Payer: Self-pay | Admitting: Cardiovascular Disease

## 2016-07-03 NOTE — Telephone Encounter (Signed)
Wife informed of stress test result.

## 2016-07-03 NOTE — Telephone Encounter (Signed)
-----   Message from Herminio Commons, MD sent at 06/30/2016  5:05 PM EDT ----- Perfusion mildly abnormal, ECG markedly abnormal. Would schedule him for follow up within next 1-2 weeks as I feel he would benefit from coronary angiography.

## 2016-07-03 NOTE — Telephone Encounter (Signed)
Seth Savage called stating that he was under the impression that he might need to be scheduled for a cath.

## 2016-07-04 ENCOUNTER — Encounter: Payer: Self-pay | Admitting: *Deleted

## 2016-07-04 ENCOUNTER — Telehealth: Payer: Self-pay | Admitting: Adult Health

## 2016-07-04 ENCOUNTER — Encounter: Payer: Self-pay | Admitting: Adult Health

## 2016-07-04 ENCOUNTER — Ambulatory Visit (INDEPENDENT_AMBULATORY_CARE_PROVIDER_SITE_OTHER): Payer: Medicare Other | Admitting: Adult Health

## 2016-07-04 ENCOUNTER — Other Ambulatory Visit: Payer: Self-pay | Admitting: Adult Health

## 2016-07-04 VITALS — BP 128/79 | HR 73 | Ht 72.0 in | Wt 226.0 lb

## 2016-07-04 DIAGNOSIS — I2510 Atherosclerotic heart disease of native coronary artery without angina pectoris: Secondary | ICD-10-CM | POA: Diagnosis not present

## 2016-07-04 DIAGNOSIS — I739 Peripheral vascular disease, unspecified: Secondary | ICD-10-CM

## 2016-07-04 DIAGNOSIS — I779 Disorder of arteries and arterioles, unspecified: Secondary | ICD-10-CM | POA: Diagnosis not present

## 2016-07-04 DIAGNOSIS — Z9889 Other specified postprocedural states: Secondary | ICD-10-CM

## 2016-07-04 DIAGNOSIS — I10 Essential (primary) hypertension: Secondary | ICD-10-CM

## 2016-07-04 NOTE — Telephone Encounter (Signed)
No precert required 

## 2016-07-04 NOTE — Telephone Encounter (Signed)
Checking percert for Left heart cath - tomorrow, 8/16 - 8:30 - [place]

## 2016-07-04 NOTE — Patient Instructions (Signed)
Medication Instructions:  Continue all current medications.  Labwork: None (already done)  Testing/Procedures: Your physician has requested that you have a cardiac catheterization. Cardiac catheterization is used to diagnose and/or treat various heart conditions. Doctors may recommend this procedure for a number of different reasons. The most common reason is to evaluate chest pain. Chest pain can be a symptom of coronary artery disease (CAD), and cardiac catheterization can show whether plaque is narrowing or blocking your heart's arteries. This procedure is also used to evaluate the valves, as well as measure the blood flow and oxygen levels in different parts of your heart. For further information please visit [email or web address]. Please follow instruction sheet, as given.  Follow-Up: 2-3 weeks after cath   Any Other Special Instructions Will Be Listed Below (If Applicable).  If you need a refill on your cardiac medications before your next appointment, please call your pharmacy.

## 2016-07-04 NOTE — Progress Notes (Signed)
Cardiology Office Note   Date:  07/04/2016   ID:  [name], DOB 11-25-1950, MRN [id number]  PCP:  Glenda Chroman, MD  Cardiologist:  Woodroe Chen, NP   No chief complaint on file. I will discontinue his knee the patient's name and medical record number (11 nodes in reviewing and let me know    History of Present Illness: Seth Savage is a 65 y.o. male who presents for ongoing assessment and management of dyspnea on exertion with diaphoresis with known history of hypertension, hyperlipidemia, GERD, and was last seen in the office on 06/28/2016 for preoperative risk stratification prior to undergoing right carotid endarterectomy. The patient underwent nuclear medicine myocardial perfusion study on 06/30/2016. This was found to be abnormal.   Blood pressure demonstrated a hypertensive response to exercise.  Downsloping ST segment depression ST segment depression was noted during stress in the II, III, aVF, V5 and V6 leads.  Defect 1: There is a medium defect of mild severity present in the basal inferoseptal, basal inferior, mid inferoseptal and mid inferior location.  Findings consistent with possible prior myocardial infarction with very mild peri-infarct ischemia.  This is a high risk study based on ECG finding during stress and recovery.  Nuclear stress EF: 52%.  He is here to discuss sending for catheterization request of Dr. Bronson Ing.  He denies any recurrent chest discomfort. He has been noticing decreased energy and easily fatigued with minimal exertion. He was scheduled in 3 days to have a right carotid endarterectomy but this is been canceled in the setting of abnormal stress test. He is already had all of his pre-surgical labs, and chest x-ray completed.  Past Medical History:  Diagnosis Date  . Arthritis   . Asthma    Childhood  . Carotid artery occlusion   . COPD (chronic obstructive pulmonary disease) ([place])   . Essential hypertension   . Goiter     Radioactive iodine treatment  . H pylori ulcer Jan 2016   Treated with Prevpac  . Headache    migraines - Topamax  . History of bronchitis   . History of kidney stones   . History of pneumonia   . Hypercholesterolemia   . Hypertension   . Neuropathy ([place])   . Shortness of breath dyspnea    with exertion  . Wears glasses     Past Surgical History:  Procedure Laterality Date  . BACK SURGERY  2003  . CAROTID ENDARTERECTOMY Left   . COLECTOMY  2000   Secondary to intussuception  . COLONOSCOPY  08/24/2004   Normal rectum,Small polyp at 25 cm, cold snared/ The remainder of the colonic mucosa appeared normal  . COLONOSCOPY  09/24/2012   Dr. Mike Craze hemorrhoids-likely source of hematochezia.Colonic diverticulosis  . ESOPHAGOGASTRODUODENOSCOPY N/A 12/09/2014   Dr. Gala Romney: Erosive reflux esophagitis. Peptic Ulcer disease secondary to H.pylori. small hiatal hernia. Nonbleeding duodenal AVM. Duodenal erosions. TREATED WITH PREVPAC  . ESOPHAGOGASTRODUODENOSCOPY N/A 03/15/2015   Procedure: ESOPHAGOGASTRODUODENOSCOPY (EGD);  Surgeon: Daneil Dolin, MD;  [facility];  Service: Endoscopy;  Laterality: N/A;  145pm  . Multiple bilateral arm/wrist surgeries after falls    . ROTATOR CUFF REPAIR Left 1992   Metal implant   . ROTATOR CUFF REPAIR Right 1999  . SHOULDER SURGERY    . SPINE SURGERY       Current Outpatient Prescriptions  Medication Sig Dispense Refill  . aspirin EC 81 MG tablet Take 81 mg by mouth daily.    Marland Kitchen  fluticasone (FLONASE) 50 MCG/ACT nasal spray Place 1 spray into both nostrils daily as needed for allergies.     . hydrochlorothiazide (HYDRODIURIL) 12.5 MG tablet Take 1 tablet by mouth daily.    Marland Kitchen losartan (COZAAR) 100 MG tablet Take 100 mg by mouth at bedtime.     . pantoprazole (PROTONIX) 40 MG tablet Take 1 tablet (40 mg total) by mouth daily. 30 tablet 11  . SYNTHROID 150 MCG tablet Take 150 mcg by mouth every morning.     . topiramate (TOPAMAX) 100  MG tablet Take 100 mg by mouth 2 (two) times daily as needed (migraines). Reported on 12/21/2015     No current facility-administered medications for this visit.     Allergies:   Lyrica [pregabalin]    Social History:  The patient  reports that he quit smoking about 9 months ago. His smoking use included Cigarettes. He has a 9.00 pack-year smoking history. He has never used smokeless tobacco. He reports that he drinks alcohol. He reports that he does not use drugs.   Family History:  The patient's family history includes Alcohol abuse in his father; COPD in his mother; Heart attack (age of onset: 64) in his mother; Hypertension in his father and mother; Mental illness in his mother; Varicose Veins in his mother.    ROS: All other systems are reviewed and negative. Unless otherwise mentioned in H&P    PHYSICAL EXAM: VS:  BP 128/79   Pulse 73   Ht 6' (1.829 m)   Wt 226 lb (102.5 kg)   BMI 30.65 kg/m  , BMI Body mass index is 30.65 kg/m. GEN: Well nourished, well developed, in no acute distress  HEENT: normal  Neck: no JVD, carotid bruits, or masses Cardiac: RRR; no murmurs, rubs, or gallops,no edema  Respiratory:  clear to auscultation bilaterally, normal work of breathing GI: soft, nontender, nondistended, + BS MS: no deformity or atrophy  Skin: warm and dry, no rash Neuro:  Strength and sensation are intact Psych: euthymic mood, full affect   Recent Labs: 06/29/2016: ALT 28; BUN 19; Creatinine, Ser 1.19; Hemoglobin 16.0; Platelets 189; Potassium 4.1; Sodium 138    Lipid Panel No results found for: CHOL, TRIG, HDL, CHOLHDL, VLDL, LDLCALC, LDLDIRECT    Wt Readings from Last 3 Encounters:  07/04/16 226 lb (102.5 kg)  06/29/16 225 lb (102.1 kg)  06/28/16 224 lb (101.6 kg)     ASSESSMENT AND PLAN:  1.  Abnormal stress Myoview: As described above, and found to be high-risk. I discussed with the patient the results of the stress test, the need to proceed with  catheterization. I discussed the procedure, risks, benefits, and answered multiple questions from himself and from his wife. The patient has recently undergone catheterization with stent placement within the last month. She is a patient of Dr. Gwenlyn Found.  I've given time for questions and explanations. They would like to move forward with catheterization this week. He is scheduled for Wednesday, October 16, with Dr. Angelena Form. Cardiac catheterization orders are completed but no new labs or x-rays are ordered as he had been done within the last 3 days. He will followup with Dr. Bronson Ing post procedure.he will likely need to be placed on statin therapy post procedure.  2. Carotid artery disease:patient was scheduled for carotid endarterectomy on 07/07/2016 but this has been delayed in the setting of abnormal stress Myoview. This will need to be rescheduled once diagnostic catheterization is completed, and will need to be discussed with pain  in vascular specialist, Dr. Donnetta Hutching, concerning timing with possible use of antiplatelet therapy post intervention.  3. Hypertension:the patient is well-controlled currently. No changes in medication regimen at this time.  Current medicines are reviewed at length with the patient today.    Labs/ tests ordered today include: precardiac catheterization orders (no new labs or x-rays if these were just completed 3 days ago) No orders of the defined types were placed in this encounter.    Disposition:   FU with to 3 weeks post catheterization  Signed, Jory Sims, NP  07/04/2016 4:22 PM    [facility] [address] [place], [place] [address] Phone: [phone]; Fax: [phone]

## 2016-07-05 ENCOUNTER — Encounter (HOSPITAL_COMMUNITY)
Admission: RE | Disposition: A | Discharge status: Home / Self Care | Payer: Self-pay | Source: Ambulatory Visit | Attending: Cardiovascular Disease

## 2016-07-05 ENCOUNTER — Ambulatory Visit (HOSPITAL_BASED_OUTPATIENT_CLINIC_OR_DEPARTMENT_OTHER)
Admission: RE | Admit: 2016-07-05 | Discharge: 2016-07-05 | Disposition: A | Discharge status: Home / Self Care | Payer: Medicare Other | Source: Ambulatory Visit | Attending: Cardiovascular Disease | Admitting: Cardiovascular Disease

## 2016-07-05 ENCOUNTER — Encounter (HOSPITAL_COMMUNITY): Payer: Self-pay | Admitting: Cardiovascular Disease

## 2016-07-05 ENCOUNTER — Telehealth: Payer: Self-pay | Admitting: *Deleted

## 2016-07-05 ENCOUNTER — Other Ambulatory Visit: Payer: Medicare Other

## 2016-07-05 ENCOUNTER — Other Ambulatory Visit: Payer: Self-pay

## 2016-07-05 DIAGNOSIS — I6529 Occlusion and stenosis of unspecified carotid artery: Secondary | ICD-10-CM | POA: Insufficient documentation

## 2016-07-05 DIAGNOSIS — K210 Gastro-esophageal reflux disease with esophagitis: Secondary | ICD-10-CM | POA: Insufficient documentation

## 2016-07-05 DIAGNOSIS — Z7982 Long term (current) use of aspirin: Secondary | ICD-10-CM

## 2016-07-05 DIAGNOSIS — Z7951 Long term (current) use of inhaled steroids: Secondary | ICD-10-CM | POA: Insufficient documentation

## 2016-07-05 DIAGNOSIS — Z87442 Personal history of urinary calculi: Secondary | ICD-10-CM

## 2016-07-05 DIAGNOSIS — G629 Polyneuropathy, unspecified: Secondary | ICD-10-CM

## 2016-07-05 DIAGNOSIS — J449 Chronic obstructive pulmonary disease, unspecified: Secondary | ICD-10-CM

## 2016-07-05 DIAGNOSIS — Z87891 Personal history of nicotine dependence: Secondary | ICD-10-CM

## 2016-07-05 DIAGNOSIS — I2510 Atherosclerotic heart disease of native coronary artery without angina pectoris: Secondary | ICD-10-CM

## 2016-07-05 DIAGNOSIS — R9439 Abnormal result of other cardiovascular function study: Secondary | ICD-10-CM

## 2016-07-05 DIAGNOSIS — E7800 Pure hypercholesterolemia, unspecified: Secondary | ICD-10-CM | POA: Insufficient documentation

## 2016-07-05 DIAGNOSIS — E785 Hyperlipidemia, unspecified: Secondary | ICD-10-CM

## 2016-07-05 DIAGNOSIS — I10 Essential (primary) hypertension: Secondary | ICD-10-CM

## 2016-07-05 DIAGNOSIS — M1990 Unspecified osteoarthritis, unspecified site: Secondary | ICD-10-CM

## 2016-07-05 DIAGNOSIS — G43909 Migraine, unspecified, not intractable, without status migrainosus: Secondary | ICD-10-CM

## 2016-07-05 DIAGNOSIS — Z8719 Personal history of other diseases of the digestive system: Secondary | ICD-10-CM

## 2016-07-05 HISTORY — PX: CARDIAC CATHETERIZATION: SHX172

## 2016-07-05 SURGERY — LEFT HEART CATH AND CORONARY ANGIOGRAPHY

## 2016-07-05 MED ORDER — SODIUM CHLORIDE 0.9 % WEIGHT BASED INFUSION
3.0000 mL/kg/h | INTRAVENOUS | Status: AC
  Administered 2016-07-05: 3 mL/kg/h via INTRAVENOUS

## 2016-07-05 MED ORDER — VERAPAMIL HCL 2.5 MG/ML IV SOLN
INTRAVENOUS | Status: DC | PRN
  Administered 2016-07-05: 10 mL via INTRA_ARTERIAL

## 2016-07-05 MED ORDER — IOPAMIDOL (ISOVUE-370) INJECTION 76%
INTRAVENOUS | Status: AC
  Filled 2016-07-05: qty 100

## 2016-07-05 MED ORDER — HEPARIN SODIUM (PORCINE) 1000 UNIT/ML IJ SOLN
INTRAMUSCULAR | Status: AC
  Filled 2016-07-05: qty 1

## 2016-07-05 MED ORDER — LIDOCAINE HCL (PF) 1 % IJ SOLN
INTRAMUSCULAR | Status: DC | PRN
  Administered 2016-07-05: 2 mL via INTRADERMAL

## 2016-07-05 MED ORDER — HEPARIN (PORCINE) IN NACL 2-0.9 UNIT/ML-% IJ SOLN
INTRAMUSCULAR | Status: DC | PRN
  Administered 2016-07-05: 1500 mL

## 2016-07-05 MED ORDER — FENTANYL CITRATE (PF) 100 MCG/2ML IJ SOLN
INTRAMUSCULAR | Status: AC
  Filled 2016-07-05: qty 2

## 2016-07-05 MED ORDER — MIDAZOLAM HCL 2 MG/2ML IJ SOLN
INTRAMUSCULAR | Status: DC | PRN
  Administered 2016-07-05: 1 mg via INTRAVENOUS

## 2016-07-05 MED ORDER — SODIUM CHLORIDE 0.9 % IV SOLN: 250.0000 mL | INTRAVENOUS | Status: DC | PRN

## 2016-07-05 MED ORDER — HEPARIN SODIUM (PORCINE) 1000 UNIT/ML IJ SOLN
INTRAMUSCULAR | Status: DC | PRN
  Administered 2016-07-05: 5000 [IU] via INTRAVENOUS

## 2016-07-05 MED ORDER — FENTANYL CITRATE (PF) 100 MCG/2ML IJ SOLN
INTRAMUSCULAR | Status: DC | PRN
  Administered 2016-07-05: 50 ug via INTRAVENOUS

## 2016-07-05 MED ORDER — SODIUM CHLORIDE 0.9 % WEIGHT BASED INFUSION: 1.0000 mL/kg/h | INTRAVENOUS | Status: DC

## 2016-07-05 MED ORDER — SODIUM CHLORIDE 0.9% FLUSH: 3.0000 mL | Freq: Two times a day (BID) | INTRAVENOUS | Status: DC

## 2016-07-05 MED ORDER — IOPAMIDOL (ISOVUE-370) INJECTION 76%
INTRAVENOUS | Status: AC
  Filled 2016-07-05: qty 125

## 2016-07-05 MED ORDER — LIDOCAINE HCL (PF) 1 % IJ SOLN
INTRAMUSCULAR | Status: AC
  Filled 2016-07-05: qty 30

## 2016-07-05 MED ORDER — ASPIRIN 81 MG PO CHEW
CHEWABLE_TABLET | ORAL | Status: AC
  Filled 2016-07-05: qty 1

## 2016-07-05 MED ORDER — ASPIRIN 81 MG PO CHEW
81.0000 mg | CHEWABLE_TABLET | ORAL | Status: AC
  Administered 2016-07-05: 81 mg via ORAL

## 2016-07-05 MED ORDER — SODIUM CHLORIDE 0.9 % IV SOLN: INTRAVENOUS | Status: AC

## 2016-07-05 MED ORDER — IOPAMIDOL (ISOVUE-370) INJECTION 76%
INTRAVENOUS | Status: DC | PRN
  Administered 2016-07-05: 90 mL via INTRA_ARTERIAL

## 2016-07-05 MED ORDER — HEPARIN (PORCINE) IN NACL 2-0.9 UNIT/ML-% IJ SOLN
INTRAMUSCULAR | Status: AC
  Filled 2016-07-05: qty 1500

## 2016-07-05 MED ORDER — MIDAZOLAM HCL 2 MG/2ML IJ SOLN
INTRAMUSCULAR | Status: AC
  Filled 2016-07-05: qty 2

## 2016-07-05 MED ORDER — SODIUM CHLORIDE 0.9% FLUSH: 3.0000 mL | INTRAVENOUS | Status: DC | PRN

## 2016-07-05 SURGICAL SUPPLY — 9 items
CATH INFINITI 5FR MULTPACK ANG (CATHETERS) ×2 IMPLANT
DEVICE RAD COMP TR BAND LRG (VASCULAR PRODUCTS) ×2 IMPLANT
GLIDESHEATH SLEND SS 6F .021 (SHEATH) ×2 IMPLANT
KIT HEART LEFT (KITS) ×3 IMPLANT
PACK CARDIAC CATHETERIZATION (CUSTOM PROCEDURE TRAY) ×3 IMPLANT
SYR MEDRAD MARK V 150ML (SYRINGE) ×3 IMPLANT
TRANSDUCER W/STOPCOCK (MISCELLANEOUS) ×3 IMPLANT
TUBING CIL FLEX 10 FLL-RA (TUBING) ×3 IMPLANT
WIRE SAFE-T 1.5MM-J .035X260CM (WIRE) ×2 IMPLANT

## 2016-07-05 NOTE — Telephone Encounter (Signed)
Message left on nurse voice mail - [facility] requesting clearance for right carotid surgery this Friday.  Patient just had cath today.

## 2016-07-05 NOTE — Research (Signed)
CAD LAD Informed Consent   Subject Name: Seth Savage  Subject met inclusion and exclusion criteria.  The informed consent form, study requirements and expectations were reviewed with the subject and questions and concerns were addressed prior to the signing of the consent form.  The subject verbalized understanding of the trail requirements.  The subject agreed to participate in the CAD LAD trial and signed the informed consent.  The informed consent was obtained prior to performance of any protocol-specific procedures for the subject.  A copy of the signed informed consent was given to the subject and a copy was placed in the subject's medical record.  Hedrick,Tammy W 07/05/2016, 0715  

## 2016-07-05 NOTE — H&P (View-Only) (Signed)
Cardiology Office Note   Date:  07/04/2016   ID:  Seth Savage, DOB [birth year], MRN [id number]  PCP:  Glenda Chroman, MD  Cardiologist:  Woodroe Chen, NP   No chief complaint on file. I will discontinue his knee the patient's name and medical record number (11 nodes in reviewing and let me know    History of Present Illness: Seth Savage is a 65 y.o. male who presents for ongoing assessment and management of dyspnea on exertion with diaphoresis with known history of hypertension, hyperlipidemia, GERD, and was last seen in the office on 06/28/2016 for preoperative risk stratification prior to undergoing right carotid endarterectomy. The patient underwent nuclear medicine myocardial perfusion study on 06/30/2016. This was found to be abnormal.   Blood pressure demonstrated a hypertensive response to exercise.  Downsloping ST segment depression ST segment depression was noted during stress in the II, III, aVF, V5 and V6 leads.  Defect 1: There is a medium defect of mild severity present in the basal inferoseptal, basal inferior, mid inferoseptal and mid inferior location.  Findings consistent with possible prior myocardial infarction with very mild peri-infarct ischemia.  This is a high risk study based on ECG finding during stress and recovery.  Nuclear stress EF: 52%.  He is here to discuss sending for catheterization request of Dr. Bronson Ing.  He denies any recurrent chest discomfort. He has been noticing decreased energy and easily fatigued with minimal exertion. He was scheduled in 3 days to have a right carotid endarterectomy but this is been canceled in the setting of abnormal stress test. He is already had all of his pre-surgical labs, and chest x-ray completed.  Past Medical History:  Diagnosis Date  . Arthritis   . Asthma    Childhood  . Carotid artery occlusion   . COPD (chronic obstructive pulmonary disease) ([place])   . Essential hypertension   . Goiter     Radioactive iodine treatment  . H pylori ulcer Jan 2016   Treated with Prevpac  . Headache    migraines - Topamax  . History of bronchitis   . History of kidney stones   . History of pneumonia   . Hypercholesterolemia   . Hypertension   . Neuropathy ([place])   . Shortness of breath dyspnea    with exertion  . Wears glasses     Past Surgical History:  Procedure Laterality Date  . BACK SURGERY  2003  . CAROTID ENDARTERECTOMY Left   . COLECTOMY  2000   Secondary to intussuception  . COLONOSCOPY  08/24/2004   Normal rectum,Small polyp at 25 cm, cold snared/ The remainder of the colonic mucosa appeared normal  . COLONOSCOPY  09/24/2012   Dr. Mike Craze hemorrhoids-likely source of hematochezia.Colonic diverticulosis  . ESOPHAGOGASTRODUODENOSCOPY N/A 12/09/2014   Dr. Gala Romney: Erosive reflux esophagitis. Peptic Ulcer disease secondary to H.pylori. small hiatal hernia. Nonbleeding duodenal AVM. Duodenal erosions. TREATED WITH PREVPAC  . ESOPHAGOGASTRODUODENOSCOPY N/A 03/15/2015   Procedure: ESOPHAGOGASTRODUODENOSCOPY (EGD);  Surgeon: Daneil Dolin, MD;  [facility];  Service: Endoscopy;  Laterality: N/A;  145pm  . Multiple bilateral arm/wrist surgeries after falls    . ROTATOR CUFF REPAIR Left 1992   Metal implant   . ROTATOR CUFF REPAIR Right 1999  . SHOULDER SURGERY    . SPINE SURGERY       Current Outpatient Prescriptions  Medication Sig Dispense Refill  . aspirin EC 81 MG tablet Take 81 mg by mouth daily.    Marland Kitchen  fluticasone (FLONASE) 50 MCG/ACT nasal spray Place 1 spray into both nostrils daily as needed for allergies.     . hydrochlorothiazide (HYDRODIURIL) 12.5 MG tablet Take 1 tablet by mouth daily.    Marland Kitchen losartan (COZAAR) 100 MG tablet Take 100 mg by mouth at bedtime.     . pantoprazole (PROTONIX) 40 MG tablet Take 1 tablet (40 mg total) by mouth daily. 30 tablet 11  . SYNTHROID 150 MCG tablet Take 150 mcg by mouth every morning.     . topiramate (TOPAMAX) 100  MG tablet Take 100 mg by mouth 2 (two) times daily as needed (migraines). Reported on 12/21/2015     No current facility-administered medications for this visit.     Allergies:   Lyrica [pregabalin]    Social History:  The patient  reports that he quit smoking about 9 months ago. His smoking use included Cigarettes. He has a 9.00 pack-year smoking history. He has never used smokeless tobacco. He reports that he drinks alcohol. He reports that he does not use drugs.   Family History:  The patient's family history includes Alcohol abuse in his father; COPD in his mother; Heart attack (age of onset: 30) in his mother; Hypertension in his father and mother; Mental illness in his mother; Varicose Veins in his mother.    ROS: All other systems are reviewed and negative. Unless otherwise mentioned in H&P    PHYSICAL EXAM: VS:  BP 128/79   Pulse 73   Ht 6' (1.829 m)   Wt 226 lb (102.5 kg)   BMI 30.65 kg/m  , BMI Body mass index is 30.65 kg/m. GEN: Well nourished, well developed, in no acute distress  HEENT: normal  Neck: no JVD, carotid bruits, or masses Cardiac: RRR; no murmurs, rubs, or gallops,no edema  Respiratory:  clear to auscultation bilaterally, normal work of breathing GI: soft, nontender, nondistended, + BS MS: no deformity or atrophy  Skin: warm and dry, no rash Neuro:  Strength and sensation are intact Psych: euthymic mood, full affect   Recent Labs: 06/29/2016: ALT 28; BUN 19; Creatinine, Ser 1.19; Hemoglobin 16.0; Platelets 189; Potassium 4.1; Sodium 138    Lipid Panel No results found for: CHOL, TRIG, HDL, CHOLHDL, VLDL, LDLCALC, LDLDIRECT    Wt Readings from Last 3 Encounters:  07/04/16 226 lb (102.5 kg)  06/29/16 225 lb (102.1 kg)  06/28/16 224 lb (101.6 kg)     ASSESSMENT AND PLAN:  1.  Abnormal stress Myoview: As described above, and found to be high-risk. I discussed with the patient the results of the stress test, the need to proceed with  catheterization. I discussed the procedure, risks, benefits, and answered multiple questions from himself and from his wife. The patient has recently undergone catheterization with stent placement within the last month. She is a patient of Dr. Gwenlyn Found.  I've given time for questions and explanations. They would like to move forward with catheterization this week. He is scheduled for Wednesday, October 16, with Dr. Angelena Form. Cardiac catheterization orders are completed but no new labs or x-rays are ordered as he had been done within the last 3 days. He will followup with Dr. Bronson Ing post procedure.he will likely need to be placed on statin therapy post procedure.  2. Carotid artery disease:patient was scheduled for carotid endarterectomy on 07/07/2016 but this has been delayed in the setting of abnormal stress Myoview. This will need to be rescheduled once diagnostic catheterization is completed, and will need to be discussed with pain  in vascular specialist, Dr. Donnetta Hutching, concerning timing with possible use of antiplatelet therapy post intervention.  3. Hypertension:the patient is well-controlled currently. No changes in medication regimen at this time.  Current medicines are reviewed at length with the patient today.    Labs/ tests ordered today include: precardiac catheterization orders (no new labs or x-rays if these were just completed 3 days ago) No orders of the defined types were placed in this encounter.    Disposition:   FU with to 3 weeks post catheterization  Signed, Jory Sims, NP  07/04/2016 4:22 PM    [facility] [address], [place], [place] [address] Phone: [phone]; Fax: [phone]

## 2016-07-05 NOTE — Discharge Instructions (Signed)
° ° °  Radial Site Care Refer to this sheet in the next few weeks. These instructions provide you with information about caring for yourself after your procedure. Your health care provider may also give you more specific instructions. Your treatment has been planned according to current medical practices, but problems sometimes occur. Call your health care provider if you have any problems or questions after your procedure. WHAT TO EXPECT AFTER THE PROCEDURE After your procedure, it is typical to have the following:  Bruising at the radial site that usually fades within 1-2 weeks.  Blood collecting in the tissue (hematoma) that may be painful to the touch. It should usually decrease in size and tenderness within 1-2 weeks. HOME CARE INSTRUCTIONS  Take medicines only as directed by your health care provider.  You may shower 24 hours after the procedure. Remove the bandage (dressing) and gently wash the site with plain soap and water. Pat the area dry with a clean towel. Do not rub the site, because this may cause bleeding.  Do not take baths, swim, or use a hot tub until your health care provider approves (5-7 days).  Check your insertion site every day for redness, swelling, or drainage.  Do not apply powder or lotion to the site.  Do not flex or bend the affected wrist for 24 hours or as directed by your health care provider.  Do not push or pull heavy objects with the affected arm for 24 hours or as directed by your health care provider.  Do not lift over 10 lb (4.5 kg) for 5 days after your procedure or as directed by your health care provider.  Ask your health care provider when it is okay to:  Return to work or school.  Resume usual physical activities or sports.  Resume sexual activity.  Do not drive home if you are discharged the same day as the procedure. Have someone else drive you.  You may drive 24 hours after the procedure unless otherwise instructed by your health care  provider.  Do not operate machinery or power tools for 24 hours after the procedure.  If your procedure was done as an outpatient procedure, which means that you went home the same day as your procedure, a responsible adult should be with you for the first 24 hours after you arrive home.  Keep all follow-up visits as directed by your health care provider. This is important. SEEK MEDICAL CARE IF:  You have a fever.  You have chills.  You have increased bleeding from the radial site. Hold pressure on the site. SEEK IMMEDIATE MEDICAL CARE IF:  You have unusual pain at the radial site.  You have redness, warmth, or swelling at the radial site.  You have drainage (other than a small amount of blood on the dressing) from the radial site.  The radial site is bleeding, and the bleeding does not stop after 30 minutes of holding steady pressure on the site call 911  Your arm or hand becomes pale, cool, tingly, or numb.   This information is not intended to replace advice given to you by your health care provider. Make sure you discuss any questions you have with your health care provider.   Document Released: 12/09/2010 Document Revised: 11/27/2014 Document Reviewed: 05/25/2014 Elsevier Interactive Patient Education [employer].

## 2016-07-05 NOTE — Interval H&P Note (Signed)
History and Physical Interval Note:  07/05/2016 8:37 AM  [name]  has presented today for cardiac cath with the diagnosis of abnormal stress test  The various methods of treatment have been discussed with the patient and family. After consideration of risks, benefits and other options for treatment, the patient has consented to  Procedure(s): Left Heart Cath and Coronary Angiography (N/A) as a surgical intervention .  The patient's history has been reviewed, patient examined, no change in status, stable for surgery.  I have reviewed the patient's chart and labs.  Questions were answered to the patient's satisfaction.    Cath Lab Visit (complete for each Cath Lab visit)  Clinical Evaluation Leading to the Procedure:   ACS: No.  Non-ACS:    Anginal Classification: CCS II  Anti-ischemic medical therapy: Minimal Therapy (1 class of medications)  Non-Invasive Test Results: High-risk stress test findings: cardiac mortality >3%/year  Prior CABG: No previous CABG         Lauree Chandler

## 2016-07-06 NOTE — Telephone Encounter (Signed)
He is cleared for carotid endarterectomy from cardiology standpoint as cath showed non-obstructive CAD.

## 2016-07-06 NOTE — Anesthesia Preprocedure Evaluation (Addendum)
Anesthesia Evaluation  Patient identified by MRN, date of birth, ID band Patient awake    Reviewed: Allergy & Precautions, NPO status , Patient's Chart, lab work & pertinent test results  History of Anesthesia Complications Negative for: history of anesthetic complications  Airway Mallampati: II  TM Distance: >3 FB Neck ROM: Full    Dental  (+) Dental Advisory Given   Pulmonary shortness of breath, COPD,  COPD inhaler, former smoker (quit 2016),    breath sounds clear to auscultation       Cardiovascular hypertension, Pt. on medications (-) angina+ Peripheral Vascular Disease   Rhythm:Regular Rate:Normal  07/05/16 cath: very mild non-obstructive disease, normal heart function   Neuro/Psych  Headaches,    GI/Hepatic Neg liver ROS, GERD  Medicated and Controlled,  Endo/Other  Hypothyroidism Morbid obesity  Renal/GU      Musculoskeletal  (+) Arthritis , Osteoarthritis,    Abdominal (+) + obese,   Peds  Hematology negative hematology ROS (+)   Anesthesia Other Findings   Reproductive/Obstetrics                            Anesthesia Physical Anesthesia Plan  ASA: III  Anesthesia Plan: General   Post-op Pain Management:    Induction: Intravenous  Airway Management Planned: Oral ETT  Additional Equipment:   Intra-op Plan:   Post-operative Plan: Extubation in OR  Informed Consent: I have reviewed the patients History and Physical, chart, labs and discussed the procedure including the risks, benefits and alternatives for the proposed anesthesia with the patient or authorized representative who has indicated his/her understanding and acceptance.   Dental advisory given  Plan Discussed with: CRNA and Surgeon  Anesthesia Plan Comments: (Plan routine monitors, A line, GETA)        Anesthesia Quick Evaluation

## 2016-07-06 NOTE — Telephone Encounter (Signed)
Will fax to [facility]

## 2016-07-07 ENCOUNTER — Telehealth: Payer: Self-pay | Admitting: Vascular Surgery

## 2016-07-07 ENCOUNTER — Encounter (HOSPITAL_COMMUNITY): Payer: Self-pay | Admitting: *Deleted

## 2016-07-07 ENCOUNTER — Inpatient Hospital Stay (HOSPITAL_COMMUNITY): Payer: Medicare Other | Admitting: Anesthesiology

## 2016-07-07 ENCOUNTER — Inpatient Hospital Stay (HOSPITAL_COMMUNITY): Payer: Medicare Other | Admitting: Emergency Medicine

## 2016-07-07 ENCOUNTER — Encounter (HOSPITAL_COMMUNITY)
Admission: RE | Disposition: A | Discharge status: Home / Self Care | Payer: Self-pay | Source: Ambulatory Visit | Attending: Vascular Surgery

## 2016-07-07 ENCOUNTER — Inpatient Hospital Stay (HOSPITAL_COMMUNITY)
Admission: RE | Admit: 2016-07-07 | Discharge: 2016-07-08 | DRG: 039 | Disposition: A | Discharge status: Home / Self Care | Payer: Medicare Other | Source: Ambulatory Visit | Attending: Vascular Surgery | Admitting: Vascular Surgery

## 2016-07-07 DIAGNOSIS — J449 Chronic obstructive pulmonary disease, unspecified: Secondary | ICD-10-CM | POA: Diagnosis present

## 2016-07-07 DIAGNOSIS — G629 Polyneuropathy, unspecified: Secondary | ICD-10-CM | POA: Diagnosis present

## 2016-07-07 DIAGNOSIS — I10 Essential (primary) hypertension: Secondary | ICD-10-CM | POA: Diagnosis present

## 2016-07-07 DIAGNOSIS — E669 Obesity, unspecified: Secondary | ICD-10-CM | POA: Diagnosis present

## 2016-07-07 DIAGNOSIS — I959 Hypotension, unspecified: Secondary | ICD-10-CM | POA: Diagnosis present

## 2016-07-07 DIAGNOSIS — Z6830 Body mass index (BMI) 30.0-30.9, adult: Secondary | ICD-10-CM | POA: Diagnosis not present

## 2016-07-07 DIAGNOSIS — Z79899 Other long term (current) drug therapy: Secondary | ICD-10-CM | POA: Diagnosis not present

## 2016-07-07 DIAGNOSIS — Z825 Family history of asthma and other chronic lower respiratory diseases: Secondary | ICD-10-CM | POA: Diagnosis not present

## 2016-07-07 DIAGNOSIS — I6522 Occlusion and stenosis of left carotid artery: Secondary | ICD-10-CM

## 2016-07-07 DIAGNOSIS — Z87891 Personal history of nicotine dependence: Secondary | ICD-10-CM

## 2016-07-07 DIAGNOSIS — Z7982 Long term (current) use of aspirin: Secondary | ICD-10-CM

## 2016-07-07 DIAGNOSIS — I6521 Occlusion and stenosis of right carotid artery: Principal | ICD-10-CM | POA: Diagnosis present

## 2016-07-07 DIAGNOSIS — E7800 Pure hypercholesterolemia, unspecified: Secondary | ICD-10-CM | POA: Diagnosis present

## 2016-07-07 DIAGNOSIS — I6529 Occlusion and stenosis of unspecified carotid artery: Secondary | ICD-10-CM | POA: Diagnosis present

## 2016-07-07 DIAGNOSIS — I739 Peripheral vascular disease, unspecified: Secondary | ICD-10-CM | POA: Diagnosis present

## 2016-07-07 HISTORY — PX: PATCH ANGIOPLASTY: SHX6230

## 2016-07-07 HISTORY — PX: ENDARTERECTOMY: SHX5162

## 2016-07-07 LAB — CREATININE, SERUM
CREATININE: 1.21 mg/dL (ref 0.61–1.24)
GFR calc non Af Amer: >60 mL/min (ref >60)

## 2016-07-07 LAB — CBC
HCT: 38.7 % — ABNORMAL LOW (ref 39.0–52.0)
Hemoglobin: 12.7 g/dL — ABNORMAL LOW (ref 13.0–17.0)
MCH: 30 pg (ref 26.0–34.0)
MCHC: 32.8 g/dL (ref 30.0–36.0)
MCV: 91.5 fL (ref 78.0–100.0)
PLATELETS: 171 10*3/uL (ref 150–400)
RBC: 4.23 MIL/uL (ref 4.22–5.81)
RDW: 13.5 % (ref 11.5–15.5)
WBC: 9.5 10*3/uL (ref 4.0–10.5)

## 2016-07-07 SURGERY — ENDARTERECTOMY, CAROTID
Anesthesia: General | Site: Neck | Laterality: Right

## 2016-07-07 MED ORDER — METOPROLOL TARTRATE 5 MG/5ML IV SOLN: 2.0000 mg | INTRAVENOUS | Status: DC | PRN

## 2016-07-07 MED ORDER — SODIUM CHLORIDE 0.9 % IV SOLN
500.0000 mL | Freq: Once | INTRAVENOUS | Status: AC | PRN
  Administered 2016-07-07: 500 mL via INTRAVENOUS

## 2016-07-07 MED ORDER — LABETALOL HCL 5 MG/ML IV SOLN: 10.0000 mg | INTRAVENOUS | Status: DC | PRN

## 2016-07-07 MED ORDER — SUGAMMADEX SODIUM 200 MG/2ML IV SOLN
INTRAVENOUS | Status: DC | PRN
  Administered 2016-07-07: 200 mg via INTRAVENOUS

## 2016-07-07 MED ORDER — POTASSIUM CHLORIDE CRYS ER 20 MEQ PO TBCR: 20.0000 meq | EXTENDED_RELEASE_TABLET | Freq: Every day | ORAL | Status: DC | PRN

## 2016-07-07 MED ORDER — PROMETHAZINE HCL 25 MG/ML IJ SOLN: 6.2500 mg | INTRAMUSCULAR | Status: DC | PRN

## 2016-07-07 MED ORDER — FENTANYL CITRATE (PF) 100 MCG/2ML IJ SOLN: 25.0000 ug | INTRAMUSCULAR | Status: DC | PRN

## 2016-07-07 MED ORDER — MIDAZOLAM HCL 2 MG/2ML IJ SOLN: 0.5000 mg | Freq: Once | INTRAMUSCULAR | Status: DC | PRN

## 2016-07-07 MED ORDER — ASPIRIN EC 81 MG PO TBEC
81.0000 mg | DELAYED_RELEASE_TABLET | Freq: Every day | ORAL | Status: DC
  Administered 2016-07-08: 81 mg via ORAL
  Filled 2016-07-07: qty 1

## 2016-07-07 MED ORDER — SODIUM CHLORIDE 0.9 % IV SOLN: INTRAVENOUS | Status: DC

## 2016-07-07 MED ORDER — OXYCODONE-ACETAMINOPHEN 5-325 MG PO TABS: 1.0000 | ORAL_TABLET | ORAL | Status: DC | PRN

## 2016-07-07 MED ORDER — LIDOCAINE 2% (20 MG/ML) 5 ML SYRINGE
INTRAMUSCULAR | Status: AC
  Filled 2016-07-07: qty 5

## 2016-07-07 MED ORDER — ACETAMINOPHEN 325 MG PO TABS: 325.0000 mg | ORAL_TABLET | ORAL | Status: DC | PRN

## 2016-07-07 MED ORDER — ATORVASTATIN CALCIUM 10 MG PO TABS: 10.0000 mg | ORAL_TABLET | Freq: Every day | ORAL | 11 refills | Status: DC

## 2016-07-07 MED ORDER — ONDANSETRON HCL 4 MG/2ML IJ SOLN
INTRAMUSCULAR | Status: DC | PRN
  Administered 2016-07-07: 4 mg via INTRAVENOUS

## 2016-07-07 MED ORDER — GLYCOPYRROLATE 0.2 MG/ML IJ SOLN
INTRAMUSCULAR | Status: DC | PRN
  Administered 2016-07-07: 0.2 mg via INTRAVENOUS

## 2016-07-07 MED ORDER — ONDANSETRON HCL 4 MG/2ML IJ SOLN
4.0000 mg | Freq: Four times a day (QID) | INTRAMUSCULAR | Status: DC | PRN
Start: 2016-07-07 — End: 2016-07-08
  Administered 2016-07-07: 4 mg via INTRAVENOUS
  Filled 2016-07-07: qty 2

## 2016-07-07 MED ORDER — GUAIFENESIN-DM 100-10 MG/5ML PO SYRP: 15.0000 mL | ORAL_SOLUTION | ORAL | Status: DC | PRN

## 2016-07-07 MED ORDER — HYDROCHLOROTHIAZIDE 25 MG PO TABS: 12.5000 mg | ORAL_TABLET | Freq: Every day | ORAL | Status: DC

## 2016-07-07 MED ORDER — LACTATED RINGERS IV SOLN
INTRAVENOUS | Status: DC | PRN
  Administered 2016-07-07 (×2): via INTRAVENOUS

## 2016-07-07 MED ORDER — PROPOFOL 10 MG/ML IV BOLUS
INTRAVENOUS | Status: AC
  Filled 2016-07-07: qty 20

## 2016-07-07 MED ORDER — MIDAZOLAM HCL 2 MG/2ML IJ SOLN
INTRAMUSCULAR | Status: AC
  Filled 2016-07-07: qty 2

## 2016-07-07 MED ORDER — PROMETHAZINE HCL 25 MG/ML IJ SOLN
INTRAMUSCULAR | Status: AC
  Filled 2016-07-07: qty 1

## 2016-07-07 MED ORDER — MAGNESIUM SULFATE 2 GM/50ML IV SOLN: 2.0000 g | Freq: Every day | INTRAVENOUS | Status: DC | PRN

## 2016-07-07 MED ORDER — FENTANYL CITRATE (PF) 100 MCG/2ML IJ SOLN
INTRAMUSCULAR | Status: DC | PRN
  Administered 2016-07-07 (×4): 50 ug via INTRAVENOUS

## 2016-07-07 MED ORDER — PROPOFOL 10 MG/ML IV BOLUS
INTRAVENOUS | Status: DC | PRN
  Administered 2016-07-07: 100 mg via INTRAVENOUS
  Administered 2016-07-07: 40 mg via INTRAVENOUS

## 2016-07-07 MED ORDER — PROTAMINE SULFATE 10 MG/ML IV SOLN
INTRAVENOUS | Status: DC | PRN
  Administered 2016-07-07: 50 mg via INTRAVENOUS

## 2016-07-07 MED ORDER — THROMBIN 20000 UNITS EX SOLR
CUTANEOUS | Status: AC
  Filled 2016-07-07: qty 20000

## 2016-07-07 MED ORDER — LEVOTHYROXINE SODIUM 75 MCG PO TABS
150.0000 ug | ORAL_TABLET | Freq: Every day | ORAL | Status: DC
  Administered 2016-07-08: 150 ug via ORAL
  Filled 2016-07-07: qty 2

## 2016-07-07 MED ORDER — PHENYLEPHRINE HCL 10 MG/ML IJ SOLN
INTRAMUSCULAR | Status: DC | PRN
  Administered 2016-07-07: 40 ug via INTRAVENOUS

## 2016-07-07 MED ORDER — FENTANYL CITRATE (PF) 100 MCG/2ML IJ SOLN
INTRAMUSCULAR | Status: AC
  Filled 2016-07-07: qty 4

## 2016-07-07 MED ORDER — HEPARIN SODIUM (PORCINE) 5000 UNIT/ML IJ SOLN
INTRAMUSCULAR | Status: DC | PRN
  Administered 2016-07-07: 500 mL

## 2016-07-07 MED ORDER — HEPARIN SODIUM (PORCINE) 1000 UNIT/ML IJ SOLN
INTRAMUSCULAR | Status: DC | PRN
  Administered 2016-07-07: 9000 [IU] via INTRAVENOUS

## 2016-07-07 MED ORDER — ALUM & MAG HYDROXIDE-SIMETH 200-200-20 MG/5ML PO SUSP
15.0000 mL | ORAL | Status: DC | PRN
  Administered 2016-07-07: 30 mL via ORAL
  Filled 2016-07-07: qty 30

## 2016-07-07 MED ORDER — CHLORHEXIDINE GLUCONATE CLOTH 2 % EX PADS: 6.0000 | MEDICATED_PAD | Freq: Once | CUTANEOUS | Status: DC

## 2016-07-07 MED ORDER — DOPAMINE-DEXTROSE 3.2-5 MG/ML-% IV SOLN: 0.0000 ug/kg/min | INTRAVENOUS | Status: DC

## 2016-07-07 MED ORDER — DEXTROSE 5 % IV SOLN
1.5000 g | INTRAVENOUS | Status: AC
  Administered 2016-07-07: 1.5 g via INTRAVENOUS
  Filled 2016-07-07: qty 1.5

## 2016-07-07 MED ORDER — ACETAMINOPHEN 325 MG RE SUPP: 325.0000 mg | RECTAL | Status: DC | PRN

## 2016-07-07 MED ORDER — 0.9 % SODIUM CHLORIDE (POUR BTL) OPTIME
TOPICAL | Status: DC | PRN
  Administered 2016-07-07: 2000 mL

## 2016-07-07 MED ORDER — SODIUM CHLORIDE 0.9 % IV SOLN
Freq: Once | INTRAVENOUS | Status: AC
  Administered 2016-07-07: 500 mL via INTRAVENOUS

## 2016-07-07 MED ORDER — ROCURONIUM BROMIDE 100 MG/10ML IV SOLN
INTRAVENOUS | Status: DC | PRN
  Administered 2016-07-07: 10 mg via INTRAVENOUS
  Administered 2016-07-07: 50 mg via INTRAVENOUS
  Administered 2016-07-07: 10 mg via INTRAVENOUS

## 2016-07-07 MED ORDER — LIDOCAINE HCL (CARDIAC) 20 MG/ML IV SOLN
INTRAVENOUS | Status: DC | PRN
  Administered 2016-07-07: 20 mg via INTRAVENOUS

## 2016-07-07 MED ORDER — LIDOCAINE HCL (PF) 1 % IJ SOLN
INTRAMUSCULAR | Status: AC
  Filled 2016-07-07: qty 30

## 2016-07-07 MED ORDER — PANTOPRAZOLE SODIUM 40 MG PO TBEC
40.0000 mg | DELAYED_RELEASE_TABLET | Freq: Every day | ORAL | Status: DC
  Administered 2016-07-08: 40 mg via ORAL
  Filled 2016-07-07: qty 1

## 2016-07-07 MED ORDER — MORPHINE SULFATE (PF) 2 MG/ML IV SOLN: 1.0000 mg | INTRAVENOUS | Status: DC | PRN

## 2016-07-07 MED ORDER — DOCUSATE SODIUM 100 MG PO CAPS
100.0000 mg | ORAL_CAPSULE | Freq: Every day | ORAL | Status: DC
  Administered 2016-07-08: 100 mg via ORAL
  Filled 2016-07-07: qty 1

## 2016-07-07 MED ORDER — MEPERIDINE HCL 25 MG/ML IJ SOLN: 6.2500 mg | INTRAMUSCULAR | Status: DC | PRN

## 2016-07-07 MED ORDER — LOSARTAN POTASSIUM 50 MG PO TABS
100.0000 mg | ORAL_TABLET | Freq: Every day | ORAL | Status: DC
  Administered 2016-07-07: 100 mg via ORAL
  Filled 2016-07-07: qty 2

## 2016-07-07 MED ORDER — FLUTICASONE PROPIONATE 50 MCG/ACT NA SUSP
1.0000 | Freq: Every day | NASAL | Status: DC | PRN
  Filled 2016-07-07: qty 16

## 2016-07-07 MED ORDER — ENOXAPARIN SODIUM 40 MG/0.4ML ~~LOC~~ SOLN: 40.0000 mg | SUBCUTANEOUS | Status: DC

## 2016-07-07 MED ORDER — CEFUROXIME SODIUM 1.5 G IJ SOLR
1.5000 g | Freq: Two times a day (BID) | INTRAMUSCULAR | Status: AC
  Administered 2016-07-07 – 2016-07-08 (×2): 1.5 g via INTRAVENOUS
  Filled 2016-07-07 (×2): qty 1.5

## 2016-07-07 MED ORDER — SODIUM CHLORIDE 0.9 % IV SOLN
0.0125 ug/kg/min | INTRAVENOUS | Status: AC
  Administered 2016-07-07: .05 ug/kg/min via INTRAVENOUS
  Filled 2016-07-07: qty 2000

## 2016-07-07 MED ORDER — EPHEDRINE SULFATE 50 MG/ML IJ SOLN
INTRAMUSCULAR | Status: DC | PRN
  Administered 2016-07-07 (×4): 5 mg via INTRAVENOUS

## 2016-07-07 MED ORDER — DOPAMINE-DEXTROSE 3.2-5 MG/ML-% IV SOLN
2.0000 ug/kg/min | INTRAVENOUS | Status: DC
  Administered 2016-07-07: 5 ug/kg/min via INTRAVENOUS
  Filled 2016-07-07: qty 250

## 2016-07-07 MED ORDER — HYDRALAZINE HCL 20 MG/ML IJ SOLN: 5.0000 mg | INTRAMUSCULAR | Status: DC | PRN

## 2016-07-07 MED ORDER — OXYCODONE-ACETAMINOPHEN 5-325 MG PO TABS: 1.0000 | ORAL_TABLET | Freq: Four times a day (QID) | ORAL | 0 refills | Status: DC | PRN

## 2016-07-07 MED ORDER — PHENYLEPHRINE HCL 10 MG/ML IJ SOLN
INTRAVENOUS | Status: DC | PRN
  Administered 2016-07-07: 40 ug/min via INTRAVENOUS

## 2016-07-07 MED ORDER — TOPIRAMATE 100 MG PO TABS: 100.0000 mg | ORAL_TABLET | Freq: Two times a day (BID) | ORAL | Status: DC | PRN

## 2016-07-07 MED ORDER — PHENOL 1.4 % MT LIQD
1.0000 | OROMUCOSAL | Status: DC | PRN
Start: 2016-07-07 — End: 2016-07-08

## 2016-07-07 MED ORDER — LACTATED RINGERS IV SOLN
INTRAVENOUS | Status: DC | PRN
  Administered 2016-07-07: 08:00:00 via INTRAVENOUS

## 2016-07-07 SURGICAL SUPPLY — 51 items
APL SKNCLS STERI-STRIP NONHPOA (GAUZE/BANDAGES/DRESSINGS) ×1
BENZOIN TINCTURE PRP APPL 2/3 (GAUZE/BANDAGES/DRESSINGS) ×3 IMPLANT
CANISTER SUCTION 2500CC (MISCELLANEOUS) ×3 IMPLANT
CANNULA VESSEL 3MM 2 BLNT TIP (CANNULA) IMPLANT
CATH ROBINSON RED A/P 18FR (CATHETERS) ×3 IMPLANT
CLIP LIGATING EXTRA MED SLVR (CLIP) ×3 IMPLANT
CLIP LIGATING EXTRA SM BLUE (MISCELLANEOUS) ×3 IMPLANT
CLOSURE STERI-STRIP 1/2X4 (GAUZE/BANDAGES/DRESSINGS) ×1
CLOSURE WOUND 1/2 X4 (GAUZE/BANDAGES/DRESSINGS) ×1
CLSR STERI-STRIP ANTIMIC 1/2X4 (GAUZE/BANDAGES/DRESSINGS) ×1 IMPLANT
CRADLE DONUT ADULT HEAD (MISCELLANEOUS) ×3 IMPLANT
DECANTER SPIKE VIAL GLASS SM (MISCELLANEOUS) IMPLANT
DRAIN HEMOVAC 1/8 X 5 (WOUND CARE) IMPLANT
DRAPE PROXIMA HALF (DRAPES) ×2 IMPLANT
DRSG COVADERM 4X8 (GAUZE/BANDAGES/DRESSINGS) ×2 IMPLANT
ELECT REM PT RETURN 9FT ADLT (ELECTROSURGICAL) ×3
ELECTRODE REM PT RTRN 9FT ADLT (ELECTROSURGICAL) ×1 IMPLANT
EVACUATOR SILICONE 100CC (DRAIN) IMPLANT
GAUZE SPONGE 4X4 12PLY STRL (GAUZE/BANDAGES/DRESSINGS) ×3 IMPLANT
GEL ULTRASOUND 20GR AQUASONIC (MISCELLANEOUS) IMPLANT
GLOVE BIO SURGEON STRL SZ 6.5 (GLOVE) ×2 IMPLANT
GLOVE BIO SURGEON STRL SZ7.5 (GLOVE) ×2 IMPLANT
GLOVE BIO SURGEONS STRL SZ 6.5 (GLOVE) ×2
GLOVE BIOGEL PI IND STRL 6.5 (GLOVE) IMPLANT
GLOVE BIOGEL PI INDICATOR 6.5 (GLOVE) ×6
GLOVE SS BIOGEL STRL SZ 7.5 (GLOVE) ×1 IMPLANT
GLOVE SUPERSENSE BIOGEL SZ 7.5 (GLOVE) ×2
GLOVE SURG SS PI 7.0 STRL IVOR (GLOVE) ×2 IMPLANT
GOWN STRL REUS W/ TWL LRG LVL3 (GOWN DISPOSABLE) ×3 IMPLANT
GOWN STRL REUS W/TWL LRG LVL3 (GOWN DISPOSABLE) ×15
GOWN STRL REUS W/TWL XL LVL3 (GOWN DISPOSABLE) ×2 IMPLANT
KIT BASIN OR (CUSTOM PROCEDURE TRAY) ×3 IMPLANT
KIT ROOM TURNOVER OR (KITS) ×3 IMPLANT
NEEDLE 22X1 1/2 (OR ONLY) (NEEDLE) IMPLANT
NS IRRIG 1000ML POUR BTL (IV SOLUTION) ×6 IMPLANT
PACK CAROTID (CUSTOM PROCEDURE TRAY) ×3 IMPLANT
PAD ARMBOARD 7.5X6 YLW CONV (MISCELLANEOUS) ×6 IMPLANT
PATCH HEMASHIELD 8X75 (Vascular Products) ×2 IMPLANT
SHUNT CAROTID BYPASS 10 (VASCULAR PRODUCTS) ×2 IMPLANT
SHUNT CAROTID BYPASS 12FRX15.5 (VASCULAR PRODUCTS) IMPLANT
SPONGE INTESTINAL PEANUT (DISPOSABLE) ×3 IMPLANT
STRIP CLOSURE SKIN 1/2X4 (GAUZE/BANDAGES/DRESSINGS) ×2 IMPLANT
SUT ETHILON 3 0 PS 1 (SUTURE) IMPLANT
SUT PROLENE 6 0 CC (SUTURE) ×3 IMPLANT
SUT SILK 3 0 (SUTURE)
SUT SILK 3-0 18XBRD TIE 12 (SUTURE) IMPLANT
SUT VIC AB 3-0 SH 27 (SUTURE) ×6
SUT VIC AB 3-0 SH 27X BRD (SUTURE) ×2 IMPLANT
SUT VICRYL 4-0 PS2 18IN ABS (SUTURE) ×3 IMPLANT
SYR CONTROL 10ML LL (SYRINGE) IMPLANT
WATER STERILE IRR 1000ML POUR (IV SOLUTION) ×3 IMPLANT

## 2016-07-07 NOTE — Transfer of Care (Signed)
Immediate Anesthesia Transfer of Care Note  Patient: Seth Savage  Procedure(s) Performed: Procedure(s): ENDARTERECTOMY CAROTID (Right) PATCH ANGIOPLASTY USING 0.8CM X 7.6CM HEMASHIELD PATCH (Right)  Patient Location: PACU  Anesthesia Type:General  Level of Consciousness: awake, alert , oriented and patient cooperative  Airway & Oxygen Therapy: Patient Spontanous Breathing and Patient connected to nasal cannula oxygen  Post-op Assessment: Report given to RN and Post -op Vital signs reviewed and stable  Post vital signs: Reviewed and stable  Last Vitals:  Vitals:   07/07/16 0546  BP: (!) 160/78  Pulse: 70  Resp: 20  Temp: 36.1 C    Last Pain:  Vitals:   07/07/16 0546  TempSrc: Oral      Patients Stated Pain Goal: 3 ([id number])  Complications: No apparent anesthesia complications

## 2016-07-07 NOTE — OR Nursing (Signed)
07/07/2016 0700 At pre-op assessment: Pt. Denies any previous CVA, visual changes, weakness or speech changes presently or in past. Pt. States he has bilateral tingling and numbness intermittently in fingers. Pt. 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

## 2016-07-07 NOTE — Care Management Note (Signed)
Case Management Note  Patient Details  Name: Seth Savage MRN: [id number] Date of Birth: 1951/11/17  Subjective/Objective:   Patient is from home with spouse, pta indep.  S/p CEA, NCM will cont to follow for dc needs.                 Action/Plan:   Expected Discharge Date:                  Expected Discharge Plan:  Home/Self Care  In-House Referral:     Discharge planning Services  CM Consult  Post Acute Care Choice:    Choice offered to:     DME Arranged:    DME Agency:     HH Arranged:    HH Agency:     Status of Service:  Completed, signed off  If discussed at [employer] of Stay Meetings, dates discussed:    Additional Comments:  Zenon Mayo, RN 07/07/2016, 3:49 PM

## 2016-07-07 NOTE — Progress Notes (Signed)
    Right CEA dressing clean and dry No slurred speech and smile is symmetric   S/P right CEA Disposition stable Hypotensive requiring Dopamine 2 mic MAP of 65 or systolic greater than 90.  COLLINS, EMMA MAUREEN PA-C

## 2016-07-07 NOTE — Anesthesia Postprocedure Evaluation (Signed)
Anesthesia Post Note  Patient: Seth Savage  Procedure(s) Performed: Procedure(s) (LRB): ENDARTERECTOMY CAROTID (Right) PATCH ANGIOPLASTY USING 0.8CM X 7.6CM HEMASHIELD PATCH (Right)  Patient location during evaluation: PACU Anesthesia Type: General Level of consciousness: sedated, oriented and patient cooperative Pain management: pain level controlled Vital Signs Assessment: post-procedure vital signs reviewed and stable Respiratory status: spontaneous breathing, nonlabored ventilation, respiratory function stable and patient connected to nasal cannula oxygen Cardiovascular status: blood pressure returned to baseline and stable Postop Assessment: no signs of nausea or vomiting Anesthetic complications: no    Last Vitals:  Vitals:   07/07/16 1230 07/07/16 1245  BP: (!) 133/49   Pulse: 66 (!) 58  Resp: (!) 23 14  Temp:      Last Pain:  Vitals:   07/07/16 1245  TempSrc:   PainSc: Asleep                 Erikah Thumm,E. Kaspar Albornoz

## 2016-07-07 NOTE — Interval H&P Note (Signed)
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 After consideration of risks, benefits and other options for treatment, the patient has consented to  Procedure(s): ENDARTERECTOMY CAROTID (Right) as a surgical intervention .  The patient's history has been reviewed, patient examined, no change in status, stable for surgery.  I have reviewed the patient's chart and labs.  Questions were answered to the patient's satisfaction.     Curt Jews

## 2016-07-07 NOTE — H&P (View-Only) (Signed)
[facility] of [facility]  Patient name: Seth Savage MRN: [id number] DOB: [birth year] Sex: male  REASON FOR VISIT: [place] of carotid disease  HPI: [name] is a 65 y.o. male seen today for follow-up of carotid disease. Well-known to me from prior endarterectomy in 2009 for severe asymptomatic left carotid stenosis. He has known to have a moderate to severe right stenosis which is been progressive over time. He continues to deny any specific neurologic deficits. No amaurosis fugax, transient ischemic attack or stroke. He reports that over the last month or so he has been having episodes of diaphoresis and tingling in both arms and shortness of breath with minimal exertion. No specific chest pain. No prior history of cardiac disease. Has quit smoking and has better respiratory capacity following this.  Past Medical History:  Diagnosis Date  . Asthma    Childhood  . Carotid artery occlusion   . COPD (chronic obstructive pulmonary disease) ([place])   . Essential hypertension   . Goiter    Radioactive iodine treatment  . H pylori ulcer Jan 2016   Treated with Prevpac  . Hypercholesterolemia   . Neuropathy ([place])     Family History  Problem Relation Age of Onset  . Colon cancer Neg Hx   . COPD Mother   . Mental illness Mother   . Hypertension Mother   . Varicose Veins Mother   . Heart attack Mother 51  . Hypertension Father   . Alcohol abuse Father     SOCIAL HISTORY: Social History  Substance Use Topics  . Smoking status: Former Smoker    Packs/day: 0.20    Years: 45.00    Types: Cigarettes    Quit date: 08/25/2015  . Smokeless tobacco: Never Used  . Alcohol use 0.0 oz/week     Comment: 2 beers per month    Allergies  Allergen Reactions  . Lyrica [Pregabalin] Shortness Of Breath  . Codeine Rash    Current Outpatient Prescriptions  Medication Sig Dispense Refill  . aspirin EC 81 MG tablet Take 81 mg by  mouth daily.    . fluticasone (FLONASE) 50 MCG/ACT nasal spray Place 1 spray into both nostrils daily.     . hydrochlorothiazide (HYDRODIURIL) 25 MG tablet Take 12.5 mg by mouth at bedtime.     Marland Kitchen losartan (COZAAR) 100 MG tablet Take 100 mg by mouth at bedtime.     . pantoprazole (PROTONIX) 40 MG tablet Take 1 tablet (40 mg total) by mouth daily. 30 tablet 11  . SYNTHROID 150 MCG tablet Take 150 mcg by mouth every morning.     . topiramate (TOPAMAX) 100 MG tablet Take 100 mg by mouth 2 (two) times daily as needed (migraines). Reported on 12/21/2015     No current facility-administered medications for this visit.     REVIEW OF SYSTEMS:  [X]  denotes positive finding, [ ]  denotes negative finding Cardiac  Comments:  Chest pain or chest pressure:    Shortness of breath upon exertion: x   Short of breath when lying flat:    Irregular heart rhythm:        Vascular    Pain in calf, thigh, or hip brought on by ambulation:    Pain in feet at night that wakes you up from your sleep:     Blood clot in your veins:    Leg swelling:  x       Pulmonary    Oxygen at home:  Productive cough:     Wheezing:         Neurologic    Sudden weakness in arms or legs:     Sudden numbness in arms or legs:  x   Sudden onset of difficulty speaking or slurred speech:    Temporary loss of vision in one eye:     Problems with dizziness:  x       Gastrointestinal    Blood in stool:     Vomited blood:         Genitourinary    Burning when urinating:     Blood in urine:        Psychiatric    Major depression:         Hematologic    Bleeding problems:    Problems with blood clotting too easily:        Skin    Rashes or ulcers:        Constitutional    Fever or chills:      PHYSICAL EXAM: Vitals:   06/13/16 0945 06/13/16 0948  BP: 135/89 133/86  Pulse: 80   Resp: 16   Temp: 98.5 F (36.9 C)   TempSrc: Oral   SpO2: 97%   Weight: 223 lb (101.2 kg)   Height: 6' (1.829 m)     GENERAL:  The patient is a well-nourished male, in no acute distress. The vital signs are documented above. CARDIAC: There is a regular rate and rhythm.  VASCULAR: 2+ left radial pulse. Absent right radial pulse related to trauma. Palpable posterior tibial pulses bilaterally PULMONARY: There is good air exchange bilaterally without wheezing or rales. ABDOMEN: Soft and non-tender    MUSCULOSKELETAL: There are no major deformities or cyanosis. NEUROLOGIC: No focal weakness or paresthesias are detected. SKIN: There are no ulcers or rashes noted. PSYCHIATRIC: The patient has a normal affect.  DATA:  CT angiogram was reviewed with the patient. This reveals at least 80% stenosis in his right carotid bifurcation. Widely patent endarterectomy on the left.  MEDICAL ISSUES: Severe asymptomatic right carotid stenosis. Have recommended endarterectomy for reduction of stroke risk. Explained that admission on the day of surgery with expected overnight circumflex I stay and discharged on postoperative day 1. Am concerned regarding his recent diaphoresis and shortness of breath with exertion. Will schedule a cardiology evaluation in [place] preoperatively. Once this is been satisfied will be scheduled for elective surgery. Discussed 12% risk of stroke with surgery.    Rosetta Posner, MD FACS [facility] Tel [phone] Pager [phone]

## 2016-07-07 NOTE — Op Note (Signed)
     Patient name: Seth Savage MRN: [id number] DOB: 22-Feb-1951 Sex: male  07/07/2016 Pre-operative Diagnosis: Asymptomatic right carotid stenosis Post-operative diagnosis:  Same Surgeon:  Rosetta Posner, M.D. Assistants:  Dr. Oneida Alar, kim Trinh PA-C Procedure:    right carotid Endarterectomy with Dacron patch angioplasty Anesthesia:  General Blood Loss:  See anesthesia record Specimens:  Carotid Plaque to pathology  Indications for surgery:  Severe asymptomatic carotid stenosis  Procedure in detail:  The patient was taken to the operating and placed in the supine position. The neck was prepped and draped in the usual sterile fashion. An incision was made anterior to the sternocleidomastoid muscle and continued with electrocautery through the platysma muscle. The muscle was retracted posteriorly and the carotid sheath was opened. The facial vein was ligated with 2-0 silk ties and divided. The common carotid artery was encircled with an umbilical tape and Rummel tourniquet. Dissection was continued onto the carotid bifurcation. The superior thyroid artery was controlled with a 2-0 silk Potts tie. The external carotid organ was encircled with a vessel loop and the internal carotid was encircled with umbilical tape and Rummel tourniquet. The hypoglossal and vagus nerves were identified and preserved.  The patient was given systemic heparinization. After adequate circulation time, the internal,external and common carotid arteries were occluded. The common carotid was opened with an 11 blade and the arteriotomy was continued with Potts scissors onto the internal carotid artery. A 10 shunt was passed up the internal carotid artery, allowed to back bleed, and then passed down the common carotid artery. The shunt was secured with Rummel tourniquet. The endarterectomy was begun on the common carotid artery  plaque was divided proximally with Potts scissors. The endarterectomy was continued onto the carotid  bifurcation. The external carotid was endarterectomized by eversion technique and the internal carotid artery was endarterectomized in an open fashion. Remaining debris was removed from the endarterectomy plane. A Dacron patch was brought to the field and sewn as a patch angioplasty. Prior to completing the anastomosis, the shunt was removed and the usual flushing maneuvers were undertaken. The anastomosis was then completed and flow was restored first to the external and then the internal carotid artery. Excellent flow characteristics were noted with hand-held Doppler in the internal and external carotid arteries.  The patient was given protamine to reverse the heparin. Hemostasis was obtained with electrocautery. The wounds were irrigated with saline. The wound was closed by first reapproximating the sternocleidomastoid muscle over the carotid artery with interrupted 3-0 Vicryl sutures. Next, the platysma was closed with a running 3-0 Vicryl suture. The skin was closed with a 4-0 subcuticular Vicryl suture. Benzoin and Steri-Strips were applied to the incision. A sterile dressing was placed over the incision. All sponge and needle counts were correct. The patient was awakened in the operating room, neurologically intact. They were transferred to the PACU in stable condition.  Carotid stenosis at surgery: Greater than 80%  Disposition:  To PACU in stable condition,neurologically intact  Relevant Operative Details:  Normal location of bifurcation  Rosetta Posner, M.D. [facility] [place] Office: [phone] Pager:  [phone]

## 2016-07-07 NOTE — Discharge Summary (Signed)
[facility]ists Discharge Summary  Seth Savage 1951-11-19 65 y.o. 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 male who presented for follow-up of carotid disease. Well-known to Dr. Donnetta Hutching from prior endarterectomy in 2009 for severe asymptomatic left carotid stenosis. He has known to have a moderate to severe right stenosis which is been progressive over time. He continues to deny any specific neurologic deficits. No amaurosis fugax, transient ischemic attack or stroke. He reports that over the last month or so he has been having episodes of diaphoresis and tingling in both arms and shortness of breath with minimal exertion. No specific chest pain. No prior history of cardiac disease. Has quit smoking and has better respiratory capacity following this.  Hospital Course:  The patient was admitted to the hospital and taken to the operating room on 07/07/2016 and underwent left carotid endarterectomy.  The patient tolerated the procedure well and was transported to the PACU in stable condition.  In the recovery room, the patient required dopamine. He was neuro intact.   By POD 1, the patient's neuro status was intact. His blood pressure was stable off of dopamine. His left neck incision was intact without hematoma. He was tolerating a diet. He was discharged home on POD 1 in good condition.    No results for input(s): NA, K, CL, CO2, GLUCOSE, BUN, CALCIUM in the last 72 hours.  Invalid input(s): CRATININE No results for input(s): WBC, HGB, HCT, PLT in the last 72 hours. No results for input(s): INR in the last 72 hours.  Discharge Instructions:   The patient is discharged to home with extensive instructions on wound care and progressive ambulation.  They are instructed not to drive or perform any heavy lifting until returning  to see the physician in his office.  Discharge Instructions    CAROTID Sugery: Call MD for difficulty swallowing or speaking; weakness in arms or legs that is a new symtom; severe headache.  If you have increased swelling in the neck and/or  are having difficulty breathing, CALL [phone]    Complete by:  As directed   Call MD for:  redness, tenderness, or signs of infection (pain, swelling, bleeding, redness, odor or green/yellow discharge around incision site)    Complete by:  As directed   Call MD for:  severe or increased pain, loss or decreased feeling  in affected limb(s)    Complete by:  As directed   Call MD for:  temperature >100.5    Complete by:  As directed   Discharge wound care:    Complete by:  As directed   Wash wound daily with soap and water and pat dry. You may wash over the strips. Do not remove the strips. They will fall off on their own.  Do not apply any creams or ointments on your incisions.   Driving Restrictions    Complete by:  As directed   No driving for 2 weeks   Increase activity slowly    Complete by:  As directed   Walk with assistance use walker or cane as needed   Lifting restrictions    Complete by:  As directed   No lifting for 2 weeks   Resume previous diet    Complete by:  As directed      Discharge Diagnosis:  Right carotid artery stenosis  I65.21  Secondary Diagnosis: Patient Active Problem List   Diagnosis Date Noted  . Abnormal stress test   . Coronary artery disease involving native coronary artery of native heart without angina pectoris   . Tobacco use disorder 03/23/2015  . Essential hypertension 03/23/2015  . Chest pain 03/22/2015  . History of peptic ulcer disease   . PUD (peptic ulcer disease) 02/25/2015  . Acute gastric ulcer   . Reflux esophagitis   . Headache(784.0) 09/03/2013  . Occlusion and stenosis of carotid artery without mention of cerebral infarction 12/12/2012  . LUQ pain 09/23/2012  . Hematochezia 09/23/2012   Past Medical  History:  Diagnosis Date  . Arthritis   . Asthma    Childhood  . Carotid artery occlusion   . COPD (chronic obstructive pulmonary disease) ([place])   . Essential hypertension   . Goiter    Radioactive iodine treatment  . H pylori ulcer Jan 2016   Treated with Prevpac  . Headache    migraines - Topamax  . History of bronchitis   . History of kidney stones   . History of pneumonia   . Hypercholesterolemia   . Hypertension   . Neuropathy ([place])   . Shortness of breath dyspnea    with exertion  . Wears glasses       Medication List    TAKE these medications   aspirin EC 81 MG tablet Take 81 mg by mouth daily.   fluticasone 50 MCG/ACT nasal spray Commonly known as:  FLONASE Place 1 spray into both nostrils daily as needed for allergies.   hydrochlorothiazide 12.5 MG tablet Commonly known as:  HYDRODIURIL Take 1 tablet by mouth daily.   losartan 100 MG tablet Commonly known as:  COZAAR Take 100 mg by mouth at bedtime.   oxyCODONE-acetaminophen 5-325 MG tablet Commonly known as:  ROXICET Take 1 tablet by mouth every 6 (six) hours as needed.   pantoprazole 40 MG tablet Commonly known as:  PROTONIX Take 1 tablet (40 mg total) by mouth daily.   SYNTHROID 150 MCG tablet Generic drug:  levothyroxine Take 150 mcg by mouth every morning.   topiramate 100 MG tablet Commonly known as:  TOPAMAX Take 100 mg by mouth 2 (two) times daily as needed (migraines). Reported on 12/21/2015       Percocet #8 No Refill  Disposition: Homd  Patient's condition: is Good  Follow up: 1. Dr.  Donnetta Hutching  in 2 weeks.   Virgina Jock, PA-C [facility] [phone]  --- For [facility] use --- Instructions: Press F2 to tab through selections.  Delete question if not applicable.   Modified Rankin score at D/C (0-6): 0  IV medication needed for:  1. Hypertension: No 2. Hypotension: Yes  Post-op Complications: No  1. Post-op CVA or TIA: No  2. CN injury:  No  3. Myocardial infarction: No  4.  CHF: No  5.  Dysrhythmia (new): No  6. Wound infection: No  7. Reperfusion symptoms: No  8. Return to OR: No   Discharge medications: Statin use:  Yes If No: [ ]  For Medical reasons, [ ]  Non-compliant, [ ]  Not-indicated ASA use:  Yes  If No: [ ]  For Medical reasons, [ ]  Non-compliant, [ ]  Not-indicated Beta blocker use:  No If No: [ ]  For Medical reasons, [ ]  Non-compliant, [ ]  Not-indicated ACE-Inhibitor use:  No, on ARB If No: [ ]  For Medical reasons, [ ]  Non-compliant, [ ]  Not-indicated P2Y12 Antagonist use: No, [ ]   Plavix, [ ]  Plasugrel, [ ]  Ticlopinine, [ ]  Ticagrelor, [ ]  Other, [ ]  No for medical reason, [ ]  Non-compliant, [x ] Not-indicated Anti-coagulant use:  No, [ ]  Warfarin, [ ]  Rivaroxaban, [ ]  Dabigatran, [ ]  Other, [ ]  No for medical reason, [ ]  Non-compliant, [x ] Not-indicated

## 2016-07-07 NOTE — Telephone Encounter (Signed)
-----   Message from Mena Goes, RN sent at 07/07/2016 10:38 AM EDT ----- Regarding: 2 weeks postop   ----- Message ----- From: Alvia Grove, PA-C Sent: 07/07/2016   9:12 AM To: Vvs Charge Pool  S/p right CEA 07/07/16  F/u with Dr. Donnetta Hutching in 2 weeks.   Thanks Maudie Mercury

## 2016-07-07 NOTE — Telephone Encounter (Signed)
Sched appt 8/29 at 10:15. Lm on hm# to inform pt.

## 2016-07-07 NOTE — Anesthesia Procedure Notes (Signed)
Procedure Name: Intubation Date/Time: 07/07/2016 7:52 AM Performed by: Myna Bright Pre-anesthesia Checklist: Patient identified, Emergency Drugs available, Suction available and Patient being monitored Patient Re-evaluated:Patient Re-evaluated prior to inductionOxygen Delivery Method: Circle System Utilized Preoxygenation: Pre-oxygenation with 100% oxygen Intubation Type: IV induction Ventilation: Oral airway inserted - appropriate to patient size and Two handed mask ventilation required Laryngoscope Size: Miller and 2 Grade View: Grade II Tube type: Oral Tube size: 7.5 mm Number of attempts: 1 Airway Equipment and Method: Stylet and LTA kit utilized Placement Confirmation: ETT inserted through vocal cords under direct vision,  positive ETCO2 and breath sounds checked- equal and bilateral Secured at: 22 cm Tube secured with: Tape Dental Injury: Teeth and Oropharynx as per pre-operative assessment

## 2016-07-08 LAB — CBC
HCT: 38.3 % — ABNORMAL LOW (ref 39.0–52.0)
Hemoglobin: 12.6 g/dL — ABNORMAL LOW (ref 13.0–17.0)
MCH: 29.8 pg (ref 26.0–34.0)
MCHC: 32.9 g/dL (ref 30.0–36.0)
MCV: 90.5 fL (ref 78.0–100.0)
PLATELETS: 178 10*3/uL (ref 150–400)
RBC: 4.23 MIL/uL (ref 4.22–5.81)
RDW: 13.4 % (ref 11.5–15.5)
WBC: 8.4 10*3/uL (ref 4.0–10.5)

## 2016-07-08 LAB — BASIC METABOLIC PANEL
ANION GAP: 8 (ref 5–15)
BUN: 14 mg/dL (ref 6–20)
CALCIUM: 8.5 mg/dL — AB (ref 8.9–10.3)
CO2: 26 mmol/L (ref 22–32)
CREATININE: 1.12 mg/dL (ref 0.61–1.24)
Chloride: 105 mmol/L (ref 101–111)
Glucose, Bld: 139 mg/dL — ABNORMAL HIGH (ref 65–99)
Potassium: 3.8 mmol/L (ref 3.5–5.1)
SODIUM: 139 mmol/L (ref 135–145)

## 2016-07-08 NOTE — Progress Notes (Addendum)
[facility] [place]  Subjective  - He has ambulated this am, Systolic [id number], off dopamine for trial.  Tolerating PO's and has voided.   Objective (!) 93/55 89 98.7 F (37.1 C) (Oral) 15 96%  Intake/Output Summary (Last 24 hours) at 07/08/16 0655 Last data filed at 07/08/16 0600  Gross per 24 hour  Intake          2807.58 ml  Output             1000 ml  Net          1807.58 ml    No tongue deviation, smile symmetric, grip 5/5 equal bilaterally Right neck incision soft without hematoma Heart RRR  Assessment/Planning: POD #Right CEA  Pending systolic pressure > 90 off dopamine will plan discharge home today He states he had hypotension when he had his left CEA as well. F/U in 2 weeks with Dr. Tonie Griffith, [place] 07/08/2016 6:55 AM --  Laboratory Lab Results:  Recent Labs  07/07/16 1435 07/08/16 0440  WBC 9.5 8.4  HGB 12.7* 12.6*  HCT 38.7* 38.3*  PLT 171 178   BMET  Recent Labs  07/07/16 1435 07/08/16 0440  NA  --  139  K  --  3.8  CL  --  105  CO2  --  26  GLUCOSE  --  139*  BUN  --  14  CREATININE 1.21 1.12  CALCIUM  --  8.5*    COAG Lab Results  Component Value Date   INR 0.90 06/29/2016   INR 0.9 03/04/2008   No results found for: PTT   Addendum  I have independently interviewed and examined the patient, and I agree with the physician assistant's findings.  BP ok.  Neuro intact.  Swallow ok.  No hematoma.  Ok to D/C.  Follow in 2 weeks with Dr Donnetta Hutching  Adele Barthel, MD [facility]: [phone] Pager: [phone]  07/08/2016, 9:35 AM

## 2016-07-10 ENCOUNTER — Encounter (HOSPITAL_COMMUNITY): Payer: Self-pay | Admitting: Vascular Surgery

## 2016-07-14 ENCOUNTER — Encounter: Payer: Self-pay | Admitting: Vascular Surgery

## 2016-07-18 ENCOUNTER — Ambulatory Visit (INDEPENDENT_AMBULATORY_CARE_PROVIDER_SITE_OTHER): Payer: Medicare Other | Admitting: Vascular Surgery

## 2016-07-18 ENCOUNTER — Other Ambulatory Visit: Payer: Self-pay | Admitting: Vascular Surgery

## 2016-07-18 ENCOUNTER — Encounter: Payer: Self-pay | Admitting: Vascular Surgery

## 2016-07-18 VITALS — BP 152/102 | HR 82 | Temp 98.2°F | Resp 20 | Ht 72.0 in | Wt 225.0 lb

## 2016-07-18 DIAGNOSIS — Z48812 Encounter for surgical aftercare following surgery on the circulatory system: Secondary | ICD-10-CM

## 2016-07-18 DIAGNOSIS — I6523 Occlusion and stenosis of bilateral carotid arteries: Secondary | ICD-10-CM

## 2016-07-18 NOTE — Progress Notes (Signed)
sdkl                                    Patient name: Seth Savage MRN: [id number] DOB: May 03, 1951 Sex: male  REASON FOR VISIT: Follow-up of right carotid endarterectomy for asymptomatic disease on 07/07/2016  HPI: Seth Savage is a 65 y.o. male today for follow-up. Did well time of surgery and was discharged home on postoperative day 1. He does report significant amount of swelling but minimal bruising in his right neck following the surgery. Has had no neurologic deficits  Current Outpatient Prescriptions  Medication Sig Dispense Refill  . aspirin EC 81 MG tablet Take 81 mg by mouth daily.    Marland Kitchen atorvastatin (LIPITOR) 10 MG tablet Take 1 tablet (10 mg total) by mouth daily. 30 tablet 11  . fluticasone (FLONASE) 50 MCG/ACT nasal spray Place 1 spray into both nostrils daily as needed for allergies.     . hydrochlorothiazide (HYDRODIURIL) 12.5 MG tablet Take 1 tablet by mouth daily.    Marland Kitchen losartan (COZAAR) 100 MG tablet Take 100 mg by mouth at bedtime.     . pantoprazole (PROTONIX) 40 MG tablet Take 1 tablet (40 mg total) by mouth daily. 30 tablet 11  . SYNTHROID 150 MCG tablet Take 150 mcg by mouth every morning.     . topiramate (TOPAMAX) 100 MG tablet Take 100 mg by mouth 2 (two) times daily as needed (migraines). Reported on 12/21/2015    . oxyCODONE-acetaminophen (ROXICET) 5-325 MG tablet Take 1 tablet by mouth every 6 (six) hours as needed. (Patient not taking: Reported on 07/18/2016) 8 tablet 0   No current facility-administered medications for this visit.      PHYSICAL EXAM: Vitals:   07/18/16 1017 07/18/16 1018  BP: (!) 148/95 (!) 152/102  Pulse: 82   Resp: 20   Temp: 98.2 F (36.8 C)   TempSrc: Oral   SpO2: 96%   Weight: 225 lb (102.1 kg)   Height: 6' (1.829 m)     GENERAL: The patient is a well-nourished male, in no acute distress. The vital signs are documented above. Right neck incision is healing quite nicely. Typical healing ridge in the incision. No bruits in  his right hour left neck. Neurologically intact  MEDICAL ISSUES: Stable status post right carotid endarterectomy. Remote history of left carotid endarterectomy. Will resume full activities. We will see him again in 6 months with repeat carotid duplex. He will notify us of any wound issues or neurologic deficits   Rosetta Posner, MD [facility] Tel [phone] Pager [phone]

## 2016-07-20 ENCOUNTER — Other Ambulatory Visit: Payer: Medicare Other

## 2016-08-09 ENCOUNTER — Telehealth: Payer: Self-pay | Admitting: *Deleted

## 2016-08-09 NOTE — Telephone Encounter (Signed)
Returned call to patient.  Seth Savage states that he is having some left jaw pain and earache and bloody drainage on his pillow during the night.  He states that for "some time" since his left carotid endarterectomy he has had fluid build up under his chin.  He says that it feels like a "jug of water".  He denies any fever or warmness of the area.  An appointment was scheduled for the NP Friday, 08/11/16 at 3:15 as he did not wish to come sooner because of work schedule.  He is fearful of losing his job. Patient is to call sooner if his symptoms should worsen or report to the nearest ED.

## 2016-08-10 ENCOUNTER — Encounter: Payer: Self-pay | Admitting: Family

## 2016-08-11 ENCOUNTER — Encounter: Payer: Self-pay | Admitting: Family

## 2016-08-11 ENCOUNTER — Ambulatory Visit (INDEPENDENT_AMBULATORY_CARE_PROVIDER_SITE_OTHER): Payer: Medicare Other | Admitting: Family

## 2016-08-11 VITALS — BP 143/90 | HR 85 | Temp 98.0°F | Resp 16 | Ht 72.0 in | Wt 226.0 lb

## 2016-08-11 DIAGNOSIS — I6523 Occlusion and stenosis of bilateral carotid arteries: Secondary | ICD-10-CM

## 2016-08-11 DIAGNOSIS — Z48812 Encounter for surgical aftercare following surgery on the circulatory system: Secondary | ICD-10-CM

## 2016-08-11 NOTE — Patient Instructions (Signed)
Stroke Prevention Some medical conditions and behaviors are associated with an increased chance of having a stroke. You may prevent a stroke by making healthy choices and managing medical conditions. HOW CAN I REDUCE MY RISK OF HAVING A STROKE?   Stay physically active. Get at least 30 minutes of activity on most or all days.  Do not smoke. It may also be helpful to avoid exposure to secondhand smoke.  Limit alcohol use. Moderate alcohol use is considered to be:  No more than 2 drinks per day for men.  No more than 1 drink per day for nonpregnant women.  Eat healthy foods. This involves:  Eating 5 or more servings of fruits and vegetables a day.  Making dietary changes that address high blood pressure (hypertension), high cholesterol, diabetes, or obesity.  Manage your cholesterol levels.  Making food choices that are high in fiber and low in saturated fat, trans fat, and cholesterol may control cholesterol levels.  Take any prescribed medicines to control cholesterol as directed by your health care provider.  Manage your diabetes.  Controlling your carbohydrate and sugar intake is recommended to manage diabetes.  Take any prescribed medicines to control diabetes as directed by your health care provider.  Control your hypertension.  Making food choices that are low in salt (sodium), saturated fat, trans fat, and cholesterol is recommended to manage hypertension.  Ask your health care provider if you need treatment to lower your blood pressure. Take any prescribed medicines to control hypertension as directed by your health care provider.  If you are 18-39 years of age, have your blood pressure checked every 3-5 years. If you are 40 years of age or older, have your blood pressure checked every year.  Maintain a healthy weight.  Reducing calorie intake and making food choices that are low in sodium, saturated fat, trans fat, and cholesterol are recommended to manage  weight.  Stop drug abuse.  Avoid taking birth control pills.  Talk to your health care provider about the risks of taking birth control pills if you are over 35 years old, smoke, get migraines, or have ever had a blood clot.  Get evaluated for sleep disorders (sleep apnea).  Talk to your health care provider about getting a sleep evaluation if you snore a lot or have excessive sleepiness.  Take medicines only as directed by your health care provider.  For some people, aspirin or blood thinners (anticoagulants) are helpful in reducing the risk of forming abnormal blood clots that can lead to stroke. If you have the irregular heart rhythm of atrial fibrillation, you should be on a blood thinner unless there is a good reason you cannot take them.  Understand all your medicine instructions.  Make sure that other conditions (such as anemia or atherosclerosis) are addressed. SEEK IMMEDIATE MEDICAL CARE IF:   You have sudden weakness or numbness of the face, arm, or leg, especially on one side of the body.  Your face or eyelid droops to one side.  You have sudden confusion.  You have trouble speaking (aphasia) or understanding.  You have sudden trouble seeing in one or both eyes.  You have sudden trouble walking.  You have dizziness.  You have a loss of balance or coordination.  You have a sudden, severe headache with no known cause.  You have new chest pain or an irregular heartbeat. Any of these symptoms may represent a serious problem that is an emergency. Do not wait to see if the symptoms will   go away. Get medical help at once. Call your local emergency services (911 in [place] Do not drive yourself to the hospital.   This information is not intended to replace advice given to you by your health care provider. Make sure you discuss any questions you have with your health care provider.   Document Released: 12/14/2004 Document Revised: 11/27/2014 Document Reviewed:  05/09/2013 Elsevier Interactive Patient Education 2016 Elsevier Inc.  

## 2016-08-11 NOTE — Progress Notes (Signed)
Postoperative Visit   History of Present Illness  Seth Savage is a 65 y.o. male patient of Dr. Donnetta Hutching who is s/p right carotid endarterectomy for asymptomatic disease on 07/07/2016. He also has a history of left CEA in 2009.   Dr. Donnetta Hutching last evaluated pt on 07/18/16. At that time his right neck incision was healing quite nicely. Typical healing ridge in the incision. No bruits in his right or left side of neck. Neurologically intact Stable status post right carotid endarterectomy. Will resume full activities. We will see him again in 6 months with repeat carotid duplex. He will notify us of any wound issues or neurologic deficits.  He returns today with c/o having some right jaw pain and earache and bloody drainage on his pillow during the night.  He states that for "some time" since his left carotid endarterectomy he has had swelling around his right side of neck incision, states the swelling has decreased.  He denies any fever or warmness of the area, denies constitutional fever or chills.  The patient's neck incision is healing.  The patient has had not stroke or TIA symptoms.  For VQI Use Only  PRE-ADM LIVING: Home  AMB STATUS: Ambulatory   Active Ambulatory Problems    Diagnosis Date Noted  . LUQ pain 09/23/2012  . Hematochezia 09/23/2012  . Occlusion and stenosis of carotid artery without mention of cerebral infarction 12/12/2012  . Headache(784.0) 09/03/2013  . Acute gastric ulcer   . Reflux esophagitis   . PUD (peptic ulcer disease) 02/25/2015  . History of peptic ulcer disease   . Chest pain 03/22/2015  . Tobacco use disorder 03/23/2015  . Essential hypertension 03/23/2015  . Abnormal stress test   . Coronary artery disease involving native coronary artery of native heart without angina pectoris   . Asymptomatic carotid artery stenosis 07/07/2016   Resolved Ambulatory Problems    Diagnosis Date Noted  . No Resolved Ambulatory Problems   Past Medical History:   Diagnosis Date  . Arthritis   . Asthma   . Carotid artery occlusion   . COPD (chronic obstructive pulmonary disease) ([place])   . Essential hypertension   . Goiter   . H pylori ulcer Jan 2016  . Headache   . History of bronchitis   . History of kidney stones   . History of pneumonia   . Hypercholesterolemia   . Hypertension   . Neuropathy ([place])   . Shortness of breath dyspnea   . Wears glasses      Social History   Social History  . Marital status: Married    Spouse name: [name]  . Number of children: 0  . Years of education: Ged   Occupational History  .      Disabled   Social History Main Topics  . Smoking status: Former Smoker    Packs/day: 0.20    Years: 45.00    Types: Cigarettes    Quit date: 09/25/2015  . Smokeless tobacco: Never Used  . Alcohol use 0.0 oz/week     Comment: 2 beers per month  . Drug use: No  . Sexual activity: Not on file   Other Topics Concern  . Not on file   Social History Narrative   Patient lives at home with his wife. [facility]).    Disabled.   Right handed.   Caffeine- two cups daily.   Three step children.    Current Outpatient Prescriptions on File Prior to Visit  Medication  Sig Dispense Refill  . aspirin EC 81 MG tablet Take 81 mg by mouth daily.    Marland Kitchen atorvastatin (LIPITOR) 10 MG tablet Take 1 tablet (10 mg total) by mouth daily. 30 tablet 11  . fluticasone (FLONASE) 50 MCG/ACT nasal spray Place 1 spray into both nostrils daily as needed for allergies.     . hydrochlorothiazide (HYDRODIURIL) 12.5 MG tablet Take 1 tablet by mouth daily.    Marland Kitchen losartan (COZAAR) 100 MG tablet Take 100 mg by mouth at bedtime.     Marland Kitchen oxyCODONE-acetaminophen (ROXICET) 5-325 MG tablet Take 1 tablet by mouth every 6 (six) hours as needed. 8 tablet 0  . pantoprazole (PROTONIX) 40 MG tablet Take 1 tablet (40 mg total) by mouth daily. 30 tablet 11  . topiramate (TOPAMAX) 100 MG tablet Take 100 mg by mouth 2 (two) times daily as needed (migraines).  Reported on 12/21/2015    . [DISCONTINUED] dicyclomine (BENTYL) 20 MG tablet Take 1 tablet (20 mg total) by mouth 2 (two) times daily. 20 tablet 0   No current facility-administered medications on file prior to visit.     Physical Examination  Vitals:   08/11/16 1513 08/11/16 1517  BP: 130/88 (!) 143/90  Pulse: 85   Resp: 16   Temp: 98 F (36.7 C)   TempSrc: Oral   SpO2: 98%   Weight: 226 lb (102.5 kg)   Height: 6' (1.829 m)    Body mass index is 30.65 kg/m.  Right side of neck: Incision is healing with a likely small hematoma along the mid to distal portion of the incision Neuro: CN 2-12 are intact, Motor strength is 5/5  bilaterally, sensation is grossly intact. No drainage along incision until expressed a scant amount of bloody drainage from the mid portion of the incision.   Medical Decision Making  Seth Savage is a 65 y.o. male who presents s/p right CEA on 07/07/2016. Dr. Bridgett Larsson spoke with and examined pt. He has a mild hematoma from the mid to the distal portion of the incision, no signs of infection. Advised warm moist compress to right side of neck incision 2-3x/day to expedite the hematoma draining.   The patient's neck incision is healing with no stroke symptoms.  He is scheduled to follow up 6 months after his August visit with Dr. Donnetta Hutching, with a carotid duplex.  I advised him to notify our office if he develops tenderness or redness at the incision.   Thank you for allowing [place] to participate in this patient's care.  NICKEL, Sharmon Leyden, RN, MSN, FNP-C [facility] [place] [facility] Office: [phone]  08/11/2016, 3:49 PM  Clinic MD: Bridgett Larsson

## 2017-01-16 ENCOUNTER — Encounter: Payer: Self-pay | Admitting: Vascular Surgery

## 2017-01-23 ENCOUNTER — Ambulatory Visit (INDEPENDENT_AMBULATORY_CARE_PROVIDER_SITE_OTHER): Payer: Medicare Other | Admitting: Vascular Surgery

## 2017-01-23 ENCOUNTER — Encounter: Payer: Self-pay | Admitting: Vascular Surgery

## 2017-01-23 ENCOUNTER — Ambulatory Visit (HOSPITAL_COMMUNITY)
Admission: RE | Admit: 2017-01-23 | Discharge: 2017-01-23 | Disposition: A | Discharge status: Home / Self Care | Payer: Medicare Other | Source: Ambulatory Visit | Attending: Vascular Surgery | Admitting: Vascular Surgery

## 2017-01-23 VITALS — BP 146/91 | HR 75 | Temp 97.0°F | Resp 16 | Ht 72.0 in | Wt 226.0 lb

## 2017-01-23 DIAGNOSIS — I6523 Occlusion and stenosis of bilateral carotid arteries: Secondary | ICD-10-CM | POA: Diagnosis present

## 2017-01-23 DIAGNOSIS — Z9889 Other specified postprocedural states: Secondary | ICD-10-CM | POA: Insufficient documentation

## 2017-01-23 LAB — VAS US CAROTID
LCCADDIAS: 28 cm/s
LCCAPDIAS: 38 cm/s
LEFT ECA DIAS: -12 cm/s
LICADDIAS: -28 cm/s
LICAPDIAS: -25 cm/s
LICAPSYS: -69 cm/s
Left CCA dist sys: 138 cm/s
Left CCA prox sys: 169 cm/s
Left ICA dist sys: -65 cm/s
RCCAPSYS: 109 cm/s
RIGHT CCA MID DIAS: 29 cm/s
RIGHT ECA DIAS: -12 cm/s
Right CCA prox dias: 22 cm/s
Right cca dist sys: -56 cm/s

## 2017-01-23 NOTE — Progress Notes (Signed)
[facility]  Patient name: Seth Savage MRN: [id number] DOB: Dec 14, 1950 Sex: male  REASON FOR VISIT: Follow-up bilateral carotid endarterectomies  HPI: Seth Savage is a 66 y.o. male here today for follow-up. He is status post right carotid endarterectomy in August 2017 and left carotid endarterectomy in April 2009. He does report some peri-incisional numbness. Otherwise no new medical problems and no neurologic deficits. He quit smoking 09/25/2015 and has continued to not smoke. I congratulated him on this.  Past Medical History:  Diagnosis Date  . Arthritis   . Asthma    Childhood  . Carotid artery occlusion   . COPD (chronic obstructive pulmonary disease) ([place])   . Essential hypertension   . Goiter    Radioactive iodine treatment  . H pylori ulcer Jan 2016   Treated with Prevpac  . Headache    migraines - Topamax  . History of bronchitis   . History of kidney stones   . History of pneumonia   . Hypercholesterolemia   . Hypertension   . Neuropathy ([place])   . Shortness of breath dyspnea    with exertion  . Wears glasses     Family History  Problem Relation Age of Onset  . COPD Mother   . Mental illness Mother   . Hypertension Mother   . Varicose Veins Mother   . Heart attack Mother 41  . Hypertension Father   . Alcohol abuse Father   . Colon cancer Neg Hx     SOCIAL HISTORY: Social History  Substance Use Topics  . Smoking status: Former Smoker    Packs/day: 0.20    Years: 45.00    Types: Cigarettes    Quit date: 09/25/2015  . Smokeless tobacco: Never Used  . Alcohol use 0.0 oz/week     Comment: 2 beers per month    Allergies  Allergen Reactions  . Lyrica [Pregabalin] Shortness Of Breath    Current Outpatient Prescriptions  Medication Sig Dispense Refill  . aspirin EC 81 MG tablet Take 81 mg by mouth daily.    Marland Kitchen atorvastatin (LIPITOR) 10 MG tablet Take 1 tablet (10 mg total) by  mouth daily. 30 tablet 11  . fluticasone (FLONASE) 50 MCG/ACT nasal spray Place 1 spray into both nostrils daily as needed for allergies.     . hydrochlorothiazide (HYDRODIURIL) 12.5 MG tablet Take 1 tablet by mouth daily.    Marland Kitchen levothyroxine (SYNTHROID, LEVOTHROID) 175 MCG tablet Take 175 mcg by mouth daily before breakfast.    . losartan (COZAAR) 100 MG tablet Take 100 mg by mouth at bedtime.     Marland Kitchen oxyCODONE-acetaminophen (ROXICET) 5-325 MG tablet Take 1 tablet by mouth every 6 (six) hours as needed. 8 tablet 0  . pantoprazole (PROTONIX) 40 MG tablet Take 1 tablet (40 mg total) by mouth daily. 30 tablet 11  . topiramate (TOPAMAX) 100 MG tablet Take 100 mg by mouth 2 (two) times daily as needed (migraines). Reported on 12/21/2015     No current facility-administered medications for this visit.     REVIEW OF SYSTEMS:  [X]  denotes positive finding, [ ]  denotes negative finding Cardiac  Comments:  Chest pain or chest pressure:    Shortness of breath upon exertion: x   Short of breath when lying flat:    Irregular heart rhythm:        Vascular    Pain in calf, thigh, or hip brought on by ambulation:    Pain  in feet at night that wakes you up from your sleep:     Blood clot in your veins:    Leg swelling:           PHYSICAL EXAM: Vitals:   01/23/17 1047  BP: (!) 146/91  Pulse: 75  Resp: 16  Temp: 97 F (36.1 C)  TempSrc: Oral  SpO2: (!) 70%  Weight: 226 lb (102.5 kg)  Height: 6' (1.829 m)    GENERAL: The patient is a well-nourished male, in no acute distress. The vital signs are documented above. CARDIOVASCULAR: Carotid incisions are well-healed bilaterally with no bruits. 2+ radial pulses PULMONARY: There is good air exchange  MUSCULOSKELETAL: There are no major deformities or cyanosis. NEUROLOGIC: No focal weakness or paresthesias are detected. SKIN: There are no ulcers or rashes noted. PSYCHIATRIC: The patient has a normal affect.  DATA:  Carotid duplex today reveals  no evidence of recurrent stenosis. Endarterectomies sites widely patent bilaterally  MEDICAL ISSUES: Stable status post bilateral carotid endarterectomies most recently August 2017 on the right. He will continue his usual activities. We will see him again in 6 months with repeat duplex and then drop back to yearly assuming this is normal. He will notify should he develop any neurologic deficits    Rosetta Posner, MD [facility] Tel [phone] Pager [phone]

## 2017-01-31 NOTE — Addendum Note (Signed)
Addended by: Lianne Cure A on: 01/31/2017 09:12 AM   Modules accepted: Orders

## 2017-02-07 ENCOUNTER — Encounter: Payer: Self-pay | Admitting: Gastroenterology

## 2017-02-07 ENCOUNTER — Ambulatory Visit (INDEPENDENT_AMBULATORY_CARE_PROVIDER_SITE_OTHER): Payer: Medicare Other | Admitting: Gastroenterology

## 2017-02-07 VITALS — BP 135/74 | HR 81 | Temp 98.0°F | Ht 72.0 in | Wt 230.8 lb

## 2017-02-07 DIAGNOSIS — R1011 Right upper quadrant pain: Secondary | ICD-10-CM

## 2017-02-07 LAB — CBC
HCT: 43.8 % (ref 38.5–50.0)
HEMOGLOBIN: 14.7 g/dL (ref 13.2–17.1)
MCH: 29.4 pg (ref 27.0–33.0)
MCHC: 33.6 g/dL (ref 32.0–36.0)
MCV: 87.6 fL (ref 80.0–100.0)
MPV: 9.5 fL (ref 7.5–12.5)
Platelets: 204 10*3/uL (ref 140–400)
RBC: 5 MIL/uL (ref 4.20–5.80)
RDW: 14.2 % (ref 11.0–15.0)
WBC: 6.9 10*3/uL (ref 3.8–10.8)

## 2017-02-07 LAB — COMPLETE METABOLIC PANEL WITH GFR
ALT: 21 U/L (ref 9–46)
AST: 13 U/L (ref 10–35)
Albumin: 4.2 g/dL (ref 3.6–5.1)
Alkaline Phosphatase: 95 U/L (ref 40–115)
BILIRUBIN TOTAL: 0.4 mg/dL (ref 0.2–1.2)
BUN: 17 mg/dL (ref 7–25)
CALCIUM: 9.4 mg/dL (ref 8.6–10.3)
CO2: 31 mmol/L (ref 20–31)
CREATININE: 1.03 mg/dL (ref 0.70–1.25)
Chloride: 101 mmol/L (ref 98–110)
GFR, EST AFRICAN AMERICAN: 89 mL/min (ref >=60)
GFR, Est Non African American: 77 mL/min (ref >=60)
Glucose, Bld: 105 mg/dL — ABNORMAL HIGH (ref 65–99)
Potassium: 4.3 mmol/L (ref 3.5–5.3)
SODIUM: 140 mmol/L (ref 135–146)
TOTAL PROTEIN: 6.7 g/dL (ref 6.1–8.1)

## 2017-02-07 LAB — LIPASE: Lipase: 15 U/L (ref 7–60)

## 2017-02-07 NOTE — Progress Notes (Signed)
cc'ed to pcp °

## 2017-02-07 NOTE — Assessment & Plan Note (Addendum)
66 year old male with RUQ pain exacerbated by eating, no alarm features. History of PUD in 2016 with EGD surveillance noting healing of ulcer. Denies NSAIDs. Query biliary etiology, as gallbladder remains in situ. Will order CBC, CMP, lipase now. US abdomen planned for now unless any concerning features with blood work. Follow low fat diet; handout provided.    As of note, he will need a colonoscopy at some point later this year.

## 2017-02-07 NOTE — Patient Instructions (Addendum)
Please have blood work done today.   I have also ordered an ultrasound of your gallbladder. We may need to do further testing.  We will do a colonoscopy at a later date.     Low-Fat Diet for Pancreatitis or Gallbladder Conditions A low-fat diet can be helpful if you have pancreatitis or a gallbladder condition. With these conditions, your pancreas and gallbladder have trouble digesting fats. A healthy eating plan with less fat will help rest your pancreas and gallbladder and reduce your symptoms. What do I need to know about this diet?  Eat a low-fat diet.  Reduce your fat intake to less than 20-30% of your total daily calories. This is less than 50-60 g of fat per day.  Remember that you need some fat in your diet. Ask your dietician what your daily goal should be.  Choose nonfat and low-fat healthy foods. Look for the words "nonfat," "low fat," or "fat free."  As a guide, look on the label and choose foods with less than 3 g of fat per serving. Eat only one serving.  Avoid alcohol.  Do not smoke. If you need help quitting, talk with your health care provider.  Eat small frequent meals instead of three large heavy meals. What foods can I eat? Grains  Include healthy grains and starches such as potatoes, wheat bread, fiber-rich cereal, and brown rice. Choose whole grain options whenever possible. In adults, whole grains should account for 45-65% of your daily calories. Fruits and Vegetables  Eat plenty of fruits and vegetables. Fresh fruits and vegetables add fiber to your diet. Meats and Other Protein Sources  Eat lean meat such as chicken and pork. Trim any fat off of meat before cooking it. Eggs, fish, and beans are other sources of protein. In adults, these foods should account for 10-35% of your daily calories. Dairy  Choose low-fat milk and dairy options. Dairy includes fat and protein, as well as calcium. Fats and Oils  Limit high-fat foods such as fried foods, sweets,  baked goods, sugary drinks. Other  Creamy sauces and condiments, such as mayonnaise, can add extra fat. Think about whether or not you need to use them, or use smaller amounts or low fat options. What foods are not recommended?  High fat foods, such as:  [employer].  Ice cream.  [place] toast.  Sweet rolls.  Pizza.  Cheese bread.  Foods covered with batter, butter, creamy sauces, or cheese.  Fried foods.  Sugary drinks and desserts.  Foods that cause gas or bloating This information is not intended to replace advice given to you by your health care provider. Make sure you discuss any questions you have with your health care provider. Document Released: 11/11/2013 Document Revised: 04/13/2016 Document Reviewed: 10/20/2013 Elsevier Interactive Patient Education  2017 [employer].

## 2017-02-07 NOTE — Progress Notes (Signed)
Referring Provider: Glenda Chroman, MD Primary Care Physician:  Seth Chroman, MD Primary GI: Dr. Gala Savage   Chief Complaint  Patient presents with  . Abdominal Pain    HPI:   Seth Savage is a 66 y.o. male presenting today with a history of PUD secondary to H.pylori with EGD in Jan 2016. Surveillance EGD April 2016 with small hiatal hernia. Previously noted peptic ulcer disease completely healed . Duodenal AVM. He is due for colonoscopy this year due to remote history of polyps.   Having pain in RUQ, going on for about a month, worse after eating. No N/V. Sometimes has to push his side in to help relieve. Has to sit up due to reflux. Severe reflux. Protonix once daily. No NSAIDs, takes an 81 mg aspirin. No weight loss or lack of appetite. Gallbladder present. Has had diarrhea since pain starting. "the only kind of BM I have". Usually just once a day. Eating fatty foods to include hot dogs, cheeseburgers, gravy.     Past Medical History:  Diagnosis Date  . Arthritis   . Asthma    Childhood  . Carotid artery occlusion   . COPD (chronic obstructive pulmonary disease) ([place])   . Essential hypertension   . Goiter    Radioactive iodine treatment  . H pylori ulcer Jan 2016   Treated with Prevpac  . Headache    migraines - Topamax  . History of bronchitis   . History of kidney stones   . History of pneumonia   . Hypercholesterolemia   . Hypertension   . Neuropathy ([place])   . Shortness of breath dyspnea    with exertion  . Wears glasses     Past Surgical History:  Procedure Laterality Date  . BACK SURGERY  2003  . CARDIAC CATHETERIZATION N/A 07/05/2016   Procedure: Left Heart Cath and Coronary Angiography;  Surgeon: Seth Blanks, MD;  Location: [facility];  Service: Cardiovascular;  Laterality: N/A;  . CAROTID ENDARTERECTOMY Left   . COLECTOMY  2000   Secondary to intussuception  . COLONOSCOPY  08/24/2004   Normal rectum,Small polyp at 25 cm, cold  snared/ The remainder of the colonic mucosa appeared normal  . COLONOSCOPY  09/24/2012   Dr. Mike Savage hemorrhoids-likely source of hematochezia.Colonic diverticulosis  . ENDARTERECTOMY Right 07/07/2016   Procedure: ENDARTERECTOMY CAROTID;  Surgeon: Seth Posner, MD;  Location: [facility] OR;  Service: Vascular;  Laterality: Right;  . ESOPHAGOGASTRODUODENOSCOPY N/A 12/09/2014   Dr. Gala Savage: Erosive reflux esophagitis. Peptic Ulcer disease secondary to H.pylori. small hiatal hernia. Nonbleeding duodenal AVM. Duodenal erosions. TREATED WITH PREVPAC  . ESOPHAGOGASTRODUODENOSCOPY N/A 03/15/2015   Dr. Gala Savage: small hiatal hernia, healed PUD, duodenal AVM.   Marland Kitchen Multiple bilateral arm/wrist surgeries after falls    . PATCH ANGIOPLASTY Right 07/07/2016   Procedure: PATCH ANGIOPLASTY USING 0.8CM X 7.6CM HEMASHIELD PATCH;  Surgeon: Seth Posner, MD;  Location: [facility];  Service: Vascular;  Laterality: Right;  . ROTATOR CUFF REPAIR Left 1992   Metal implant   . ROTATOR CUFF REPAIR Right 1999  . SHOULDER SURGERY    . SPINE SURGERY      Current Outpatient Prescriptions  Medication Sig Dispense Refill  . aspirin EC 81 MG tablet Take 81 mg by mouth daily.    Marland Kitchen atorvastatin (LIPITOR) 10 MG tablet Take 1 tablet (10 mg total) by mouth daily. 30 tablet 11  . fluticasone (FLONASE) 50 MCG/ACT nasal spray Place 1  spray into both nostrils daily as needed for allergies.     . hydrochlorothiazide (HYDRODIURIL) 12.5 MG tablet Take 1 tablet by mouth daily.    Marland Kitchen levothyroxine (SYNTHROID, LEVOTHROID) 175 MCG tablet Take 175 mcg by mouth daily before breakfast.    . losartan (COZAAR) 100 MG tablet Take 100 mg by mouth at bedtime.     . pantoprazole (PROTONIX) 40 MG tablet Take 1 tablet (40 mg total) by mouth daily. 30 tablet 11  . topiramate (TOPAMAX) 100 MG tablet Take 100 mg by mouth 2 (two) times daily as needed (migraines). Reported on 12/21/2015     No current facility-administered medications for this visit.     Allergies  as of 02/07/2017 - Review Complete 02/07/2017  Allergen Reaction Noted  . Lyrica [pregabalin] Shortness Of Breath 09/17/2012    Family History  Problem Relation Age of Onset  . COPD Mother   . Mental illness Mother   . Hypertension Mother   . Varicose Veins Mother   . Heart attack Mother 88  . Hypertension Father   . Alcohol abuse Father   . Colon cancer Neg Hx     Social History   Social History  . Marital status: Married    Spouse name: Seth Savage  . Number of children: 0  . Years of education: Ged   Occupational History  .      Disabled   Social History Main Topics  . Smoking status: Former Smoker    Packs/day: 0.20    Years: 45.00    Types: Cigarettes    Quit date: 09/25/2015  . Smokeless tobacco: Never Used  . Alcohol use 0.0 oz/week     Comment: 2 beers per month, rarely   . Drug use: No  . Sexual activity: Not Asked   Other Topics Concern  . None   Social History Narrative   Patient lives at home with his wife. [facility]).    Disabled.   Right handed.   Caffeine- two cups daily.   Three step children.    Review of Systems: As mentioned in HPI.   Physical Exam: BP 135/74   Pulse 81   Temp 98 F (36.7 C) (Oral)   Ht 6' (1.829 m)   Wt 230 lb 12.8 oz (104.7 kg)   BMI 31.30 kg/m  General:   Alert and oriented. No distress noted. Pleasant and cooperative.  Head:  Normocephalic and atraumatic. Eyes:  Conjuctiva clear without scleral icterus. Mouth:  Oral mucosa pink and moist.  Heart:  S1, S2 present without murmurs, rubs, or gallops. Regular rate and rhythm. Abdomen:  +BS, soft, mild TTP RUQ and non-distended. No rebound or guarding. No HSM or masses noted. Msk:  Symmetrical without gross deformities. Normal posture. Extremities:  Without edema. Neurologic:  Alert and  oriented x4;  grossly normal neurologically. Psych:  Alert and cooperative. Normal mood and affect.

## 2017-02-07 NOTE — Patient Instructions (Signed)
Attempted to do PA for [place] abd limited RUQ via [facility] website. No PA needed.

## 2017-02-09 ENCOUNTER — Ambulatory Visit (HOSPITAL_COMMUNITY)
Admission: RE | Admit: 2017-02-09 | Discharge: 2017-02-09 | Disposition: A | Discharge status: Home / Self Care | Payer: Medicare Other | Source: Ambulatory Visit | Attending: Gastroenterology | Admitting: Gastroenterology

## 2017-02-09 DIAGNOSIS — K769 Liver disease, unspecified: Secondary | ICD-10-CM | POA: Diagnosis not present

## 2017-02-09 DIAGNOSIS — R1011 Right upper quadrant pain: Secondary | ICD-10-CM | POA: Diagnosis present

## 2017-02-12 NOTE — Progress Notes (Signed)
CBC, CMP, lipase all normal. [place] without gallstones. Fatty liver noted. Would recommend HIDA. Please schedule HIDA due to RUQ pain.

## 2017-02-13 ENCOUNTER — Other Ambulatory Visit: Payer: Self-pay

## 2017-02-13 DIAGNOSIS — R1011 Right upper quadrant pain: Secondary | ICD-10-CM

## 2017-02-19 ENCOUNTER — Encounter (HOSPITAL_COMMUNITY)
Admission: RE | Admit: 2017-02-19 | Discharge: 2017-02-19 | Disposition: A | Discharge status: Home / Self Care | Payer: Medicare Other | Source: Ambulatory Visit | Attending: Gastroenterology | Admitting: Gastroenterology

## 2017-02-19 ENCOUNTER — Encounter (HOSPITAL_COMMUNITY): Payer: Self-pay

## 2017-02-19 DIAGNOSIS — R1011 Right upper quadrant pain: Secondary | ICD-10-CM | POA: Diagnosis not present

## 2017-02-19 MED ORDER — SODIUM CHLORIDE 0.9% FLUSH
INTRAVENOUS | Status: AC
  Filled 2017-02-19: qty 180

## 2017-02-19 MED ORDER — TECHNETIUM TC 99M MEBROFENIN IV KIT
5.0000 | PACK | Freq: Once | INTRAVENOUS | Status: AC | PRN
  Administered 2017-02-19: 5 via INTRAVENOUS

## 2017-02-26 ENCOUNTER — Telehealth: Payer: Self-pay

## 2017-02-26 NOTE — Telephone Encounter (Signed)
Pt received letter from DS to be triaged. Please call [phone]

## 2017-02-27 NOTE — Progress Notes (Signed)
LMOM to call.

## 2017-02-27 NOTE — Telephone Encounter (Signed)
See separate result note. LMOM for a return call.

## 2017-02-28 NOTE — Progress Notes (Signed)
Pt's wife [name] is aware and appt has been made for an OV on 03/23/2017 at 8:30 am with Neil Crouch, PA.

## 2017-03-23 ENCOUNTER — Encounter: Payer: Self-pay | Admitting: Gastroenterology

## 2017-03-23 ENCOUNTER — Ambulatory Visit (INDEPENDENT_AMBULATORY_CARE_PROVIDER_SITE_OTHER): Payer: Medicare Other | Admitting: Gastroenterology

## 2017-03-23 ENCOUNTER — Other Ambulatory Visit: Payer: Self-pay

## 2017-03-23 VITALS — BP 131/73 | HR 73 | Temp 97.8°F | Ht 72.0 in | Wt 228.6 lb

## 2017-03-23 DIAGNOSIS — Z86010 Personal history of colonic polyps: Secondary | ICD-10-CM

## 2017-03-23 DIAGNOSIS — R1011 Right upper quadrant pain: Secondary | ICD-10-CM

## 2017-03-23 MED ORDER — PEG 3350-KCL-NA BICARB-NACL 420 G PO SOLR: 4000.0000 mL | ORAL | 0 refills | Status: DC

## 2017-03-23 NOTE — Patient Instructions (Addendum)
1. With next significant episode of right upper abdominal pain, go get blood work at [facility] approximate 6 hours afterwards or the next soonest time once the lab is open. 2. If severe pain, nausea, vomiting, fever please go to the ER. 3. Increase pantoprazole to 40 mg twice daily, 30 minutes before meals until your upper endoscopy. 4. Colonoscopy and upper endoscopy as scheduled. Please see separate instructions.

## 2017-03-23 NOTE — Progress Notes (Signed)
Primary Care Physician:  Glenda Chroman, MD  Primary Gastroenterologist:  Garfield Cornea, MD   Chief Complaint  Patient presents with  . Abdominal Pain    right upper side  . Colonoscopy    HPI:  Seth Savage is a 66 y.o. male here At the request to PCP for colonoscopy for history of remote colon polyps. Patient also complains of ongoing right upper quadrant pain. Last seen in March of this year.  Patient has a history of PUD secondary to H.pylori with EGD in Jan 2016. Surveillance EGD April 2016 with small hiatal hernia. Previously noted peptic ulcer disease completely healed . Duodenal AVM.   Patient continues to have postprandial right upper quadrant pain for several months. Associated with nausea and sometimes diaphoresis. Sometimes postprandial but not always. Pain can be quite severe at times. Definitely episodic. Regurgitation at night. Denies being woken up with right upper quadrant pain.Chronically ill contacts although was off of it for about one month but now back once daily. No N/V. No weight loss or anorexia. Bowel movements soft like "peanut butter". No melena. Some bright red blood per rectum? From hemorrhoids. No NSAIDs. Takes aspirin 81 mg daily.  Right upper quadrant ultrasound March showed fatty liver but otherwise unremarkable. HIDA scan unremarkable as well. No reproduction of symptoms. Was advised to consider EGD, colonoscopy    Current Outpatient Prescriptions  Medication Sig Dispense Refill  . aspirin EC 81 MG tablet Take 81 mg by mouth daily.    Marland Kitchen atorvastatin (LIPITOR) 10 MG tablet Take 1 tablet (10 mg total) by mouth daily. 30 tablet 11  . fluticasone (FLONASE) 50 MCG/ACT nasal spray Place 1 spray into both nostrils daily as needed for allergies.     . hydrochlorothiazide (HYDRODIURIL) 12.5 MG tablet Take 1 tablet by mouth daily.    Marland Kitchen levothyroxine (SYNTHROID, LEVOTHROID) 175 MCG tablet Take 175 mcg by mouth daily before breakfast.    . losartan (COZAAR) 100  MG tablet Take 100 mg by mouth at bedtime.     . pantoprazole (PROTONIX) 40 MG tablet Take 1 tablet (40 mg total) by mouth daily. 30 tablet 11   No current facility-administered medications for this visit.     Allergies as of 03/23/2017 - Review Complete 03/23/2017  Allergen Reaction Noted  . Lyrica [pregabalin] Shortness Of Breath 09/17/2012    Past Medical History:  Diagnosis Date  . Arthritis   . Asthma    Childhood  . Carotid artery occlusion   . COPD (chronic obstructive pulmonary disease) ([place])   . Essential hypertension   . Goiter    Radioactive iodine treatment  . H pylori ulcer Jan 2016   Treated with Prevpac  . Headache    migraines - Topamax  . History of bronchitis   . History of kidney stones   . History of pneumonia   . Hypercholesterolemia   . Hypertension   . Hypothyroid   . Neuropathy   . Shortness of breath dyspnea    with exertion  . Wears glasses     Past Surgical History:  Procedure Laterality Date  . BACK SURGERY  2003  . CARDIAC CATHETERIZATION N/A 07/05/2016   Procedure: Left Heart Cath and Coronary Angiography;  Surgeon: Burnell Blanks, MD;  Location: [facility];  Service: Cardiovascular;  Laterality: N/A;  . CAROTID ENDARTERECTOMY Left   . COLECTOMY  2000   Secondary to intussuception  . COLONOSCOPY  08/24/2004   Normal rectum,Small polyp at 25 cm,  cold snared/ The remainder of the colonic mucosa appeared normal  . COLONOSCOPY  09/24/2012   Dr. Mike Craze hemorrhoids-likely source of hematochezia.Colonic diverticulosis  . ENDARTERECTOMY Right 07/07/2016   Procedure: ENDARTERECTOMY CAROTID;  Surgeon: Rosetta Posner, MD;  Location: [facility];  Service: Vascular;  Laterality: Right;  . ESOPHAGOGASTRODUODENOSCOPY N/A 12/09/2014   Dr. Gala Romney: Erosive reflux esophagitis. Peptic Ulcer disease secondary to H.pylori. small hiatal hernia. Nonbleeding duodenal AVM. Duodenal erosions. TREATED WITH PREVPAC  . ESOPHAGOGASTRODUODENOSCOPY N/A  03/15/2015   Dr. Gala Romney: small hiatal hernia, healed PUD, duodenal AVM.   Marland Kitchen Multiple bilateral arm/wrist surgeries after falls    . PATCH ANGIOPLASTY Right 07/07/2016   Procedure: PATCH ANGIOPLASTY USING 0.8CM X 7.6CM HEMASHIELD PATCH;  Surgeon: Rosetta Posner, MD;  Location: [facility];  Service: Vascular;  Laterality: Right;  . ROTATOR CUFF REPAIR Left 1992   Metal implant   . ROTATOR CUFF REPAIR Right 1999  . SHOULDER SURGERY    . SPINE SURGERY      Family History  Problem Relation Age of Onset  . COPD Mother   . Mental illness Mother   . Hypertension Mother   . Varicose Veins Mother   . Heart attack Mother 60  . Hypertension Father   . Alcohol abuse Father   . Colon cancer Neg Hx     Social History   Social History  . Marital status: Married    Spouse name: [name]  . Number of children: 0  . Years of education: Ged   Occupational History  .      Disabled   Social History Main Topics  . Smoking status: Former Smoker    Packs/day: 0.20    Years: 45.00    Types: Cigarettes    Quit date: 09/25/2015  . Smokeless tobacco: Never Used  . Alcohol use 0.0 oz/week     Comment: 2 beers per month, rarely   . Drug use: No  . Sexual activity: Not on file   Other Topics Concern  . Not on file   Social History Narrative   Patient lives at home with his wife. [facility]).    Disabled.   Right handed.   Caffeine- two cups daily.   Three step children.      ROS:  General: Negative for anorexia, weight loss, fever, chills, fatigue, weakness. Eyes: Negative for vision changes.  ENT: Negative for hoarseness, difficulty swallowing , nasal congestion. CV: Negative for chest pain, angina, palpitations, dyspnea on exertion, peripheral edema.  Respiratory: Negative for dyspnea at rest, dyspnea on exertion, cough, sputum, wheezing.  GI: See history of present illness. GU:  Negative for dysuria, hematuria, urinary incontinence, urinary frequency, nocturnal urination.  MS: Negative for  joint pain, low back pain.  Derm: Negative for rash or itching.  Neuro: Negative for weakness, abnormal sensation, seizure, frequent headaches, memory loss, confusion.  Psych: Negative for anxiety, depression, suicidal ideation, hallucinations.  Endo: Negative for unusual weight change.  Heme: Negative for bruising or bleeding. Allergy: Negative for rash or hives.    Physical Examination:  BP 131/73   Pulse 73   Temp 97.8 F (36.6 C) (Oral)   Ht 6' (1.829 m)   Wt 228 lb 9.6 oz (103.7 kg)   BMI 31.00 kg/m    General: Well-nourished, well-developed in no acute distress.  Head: Normocephalic, atraumatic.   Eyes: Conjunctiva pink, no icterus. Mouth: Oropharyngeal mucosa moist and pink , no lesions erythema or exudate. Neck: Supple without thyromegaly, masses, or lymphadenopathy.  Lungs: Clear to auscultation bilaterally.  Heart: Regular rate and rhythm, no murmurs rubs or gallops.  Abdomen: Bowel sounds are normal, tender anterior right ribs and ruq, nondistended, no hepatosplenomegaly or masses, no abdominal bruits or    hernia , no rebound or guarding.   Rectal: deferred Extremities: No lower extremity edema. No clubbing or deformities.  Neuro: Alert and oriented x 4 , grossly normal neurologically.  Skin: Warm and dry, no rash or jaundice.   Psych: Alert and cooperative, normal mood and affect.  Labs: Lab Results  Component Value Date   LIPASE 15 02/07/2017   Lab Results  Component Value Date   CREATININE 1.03 02/07/2017   BUN 17 02/07/2017   NA 140 02/07/2017   K 4.3 02/07/2017   CL 101 02/07/2017   CO2 31 02/07/2017   Lab Results  Component Value Date   ALT 21 02/07/2017   AST 13 02/07/2017   ALKPHOS 95 02/07/2017   BILITOT 0.4 02/07/2017   Lab Results  Component Value Date   WBC 6.9 02/07/2017   HGB 14.7 02/07/2017   HCT 43.8 02/07/2017   MCV 87.6 02/07/2017   PLT 204 02/07/2017   09/26/2016 TSH 2.5  Imaging Studies: No results found.

## 2017-03-26 ENCOUNTER — Encounter: Payer: Self-pay | Admitting: Gastroenterology

## 2017-03-26 NOTE — Assessment & Plan Note (Addendum)
66 year old gentleman with right upper quadrant pain for several months, exacerbated by eating at times. Gallbladder workup unremarkable. Remote history of peptic ulcer disease with documented healing of ulcer in 2016. Denies NSAIDs. Takes a daily aspirin. Proceed with upper endoscopy for further evaluation given chronic persistent symptoms.  I have discussed the risks, alternatives, benefits with regards to but not limited to the risk of reaction to medication, bleeding, infection, perforation and the patient is agreeable to proceed. Written consent to be obtained.  With next significant episode, he will go to the lab approximately 6 hours after onset of symptoms for labs. Warning signs explained to patient for which she should go to the ED. Increase PPI to twice a day in the meantime.

## 2017-03-26 NOTE — Assessment & Plan Note (Signed)
Due surveillance colonoscopy for history of colon polyps in the past.  I have discussed the risks, alternatives, benefits with regards to but not limited to the risk of reaction to medication, bleeding, infection, perforation and the patient is agreeable to proceed. Written consent to be obtained.

## 2017-03-27 NOTE — Progress Notes (Signed)
CC'ED TO PCP 

## 2017-04-30 ENCOUNTER — Telehealth: Payer: Self-pay

## 2017-04-30 NOTE — Telephone Encounter (Signed)
Called pt. TCS/EGD for tomorrow moved up to 12:00pm, pt to arrive at 11:00am. Advised to start drinking 2nd half of prep in the morning at 7:00am and complete by 9:00am. LMOVM and informed Endo Scheduler.

## 2017-05-01 ENCOUNTER — Encounter (HOSPITAL_COMMUNITY): Payer: Self-pay | Admitting: *Deleted

## 2017-05-01 ENCOUNTER — Encounter (HOSPITAL_COMMUNITY)
Admission: RE | Disposition: A | Discharge status: Home / Self Care | Payer: Self-pay | Source: Ambulatory Visit | Attending: Internal Medicine

## 2017-05-01 ENCOUNTER — Ambulatory Visit (HOSPITAL_COMMUNITY)
Admission: RE | Admit: 2017-05-01 | Discharge: 2017-05-01 | Disposition: A | Discharge status: Home / Self Care | Payer: Medicare Other | Source: Ambulatory Visit | Attending: Internal Medicine | Admitting: Internal Medicine

## 2017-05-01 DIAGNOSIS — K209 Esophagitis, unspecified: Secondary | ICD-10-CM

## 2017-05-01 DIAGNOSIS — K5730 Diverticulosis of large intestine without perforation or abscess without bleeding: Secondary | ICD-10-CM | POA: Diagnosis not present

## 2017-05-01 DIAGNOSIS — Z1211 Encounter for screening for malignant neoplasm of colon: Secondary | ICD-10-CM | POA: Insufficient documentation

## 2017-05-01 DIAGNOSIS — Z86010 Personal history of colonic polyps: Secondary | ICD-10-CM

## 2017-05-01 DIAGNOSIS — I10 Essential (primary) hypertension: Secondary | ICD-10-CM | POA: Insufficient documentation

## 2017-05-01 DIAGNOSIS — Z9049 Acquired absence of other specified parts of digestive tract: Secondary | ICD-10-CM | POA: Diagnosis not present

## 2017-05-01 DIAGNOSIS — Z87891 Personal history of nicotine dependence: Secondary | ICD-10-CM | POA: Diagnosis not present

## 2017-05-01 DIAGNOSIS — R1011 Right upper quadrant pain: Secondary | ICD-10-CM | POA: Diagnosis not present

## 2017-05-01 DIAGNOSIS — Z7982 Long term (current) use of aspirin: Secondary | ICD-10-CM | POA: Insufficient documentation

## 2017-05-01 DIAGNOSIS — Z888 Allergy status to other drugs, medicaments and biological substances status: Secondary | ICD-10-CM | POA: Insufficient documentation

## 2017-05-01 DIAGNOSIS — K3189 Other diseases of stomach and duodenum: Secondary | ICD-10-CM | POA: Diagnosis not present

## 2017-05-01 DIAGNOSIS — E039 Hypothyroidism, unspecified: Secondary | ICD-10-CM | POA: Diagnosis not present

## 2017-05-01 DIAGNOSIS — Z79899 Other long term (current) drug therapy: Secondary | ICD-10-CM | POA: Diagnosis not present

## 2017-05-01 DIAGNOSIS — J449 Chronic obstructive pulmonary disease, unspecified: Secondary | ICD-10-CM | POA: Diagnosis not present

## 2017-05-01 DIAGNOSIS — G43909 Migraine, unspecified, not intractable, without status migrainosus: Secondary | ICD-10-CM | POA: Diagnosis not present

## 2017-05-01 HISTORY — PX: ESOPHAGOGASTRODUODENOSCOPY: SHX5428

## 2017-05-01 HISTORY — PX: BIOPSY: SHX5522

## 2017-05-01 HISTORY — PX: COLONOSCOPY: SHX5424

## 2017-05-01 SURGERY — COLONOSCOPY
Anesthesia: Moderate Sedation

## 2017-05-01 MED ORDER — LIDOCAINE VISCOUS 2 % MT SOLN
OROMUCOSAL | Status: DC | PRN
  Administered 2017-05-01: 3 mL via OROMUCOSAL

## 2017-05-01 MED ORDER — MEPERIDINE HCL 100 MG/ML IJ SOLN
INTRAMUSCULAR | Status: AC
  Filled 2017-05-01: qty 2

## 2017-05-01 MED ORDER — ONDANSETRON HCL 4 MG/2ML IJ SOLN
INTRAMUSCULAR | Status: AC
  Filled 2017-05-01: qty 2

## 2017-05-01 MED ORDER — LIDOCAINE VISCOUS 2 % MT SOLN
OROMUCOSAL | Status: AC
  Filled 2017-05-01: qty 15

## 2017-05-01 MED ORDER — MEPERIDINE HCL 100 MG/ML IJ SOLN
INTRAMUSCULAR | Status: DC | PRN
  Administered 2017-05-01 (×2): 25 mg via INTRAVENOUS
  Administered 2017-05-01: 50 mg via INTRAVENOUS
  Administered 2017-05-01: 25 mg via INTRAVENOUS

## 2017-05-01 MED ORDER — ONDANSETRON HCL 4 MG/2ML IJ SOLN
INTRAMUSCULAR | Status: DC | PRN
  Administered 2017-05-01: 4 mg via INTRAVENOUS

## 2017-05-01 MED ORDER — SODIUM CHLORIDE 0.9 % IV SOLN
INTRAVENOUS | Status: DC
  Administered 2017-05-01: 11:00:00 via INTRAVENOUS

## 2017-05-01 MED ORDER — MIDAZOLAM HCL 5 MG/5ML IJ SOLN
INTRAMUSCULAR | Status: AC
  Filled 2017-05-01: qty 10

## 2017-05-01 MED ORDER — MIDAZOLAM HCL 5 MG/5ML IJ SOLN
INTRAMUSCULAR | Status: DC | PRN
  Administered 2017-05-01: 2 mg via INTRAVENOUS
  Administered 2017-05-01 (×2): 1 mg via INTRAVENOUS
  Administered 2017-05-01: 2 mg via INTRAVENOUS

## 2017-05-01 MED ORDER — STERILE WATER FOR IRRIGATION IR SOLN
Status: DC | PRN
  Administered 2017-05-01: 13:00:00

## 2017-05-01 NOTE — Op Note (Signed)
[facility] Patient Name: Seth Savage Procedure Date: 05/01/2017 1:04 PM MRN: [id number] Date of Birth: February 16, 1951 Attending MD: Gennette Pac , MD CSN: [id number] Age: 66 Admit Type: Outpatient Procedure:                Colonoscopy Indications:              High risk colon cancer surveillance: Personal                            history of colonic polyps Providers:                Gennette Pac, MD, Loma Messing B. Mathis Fare RN, RN,                            Burke Keels, Technician Referring MD:              Medicines:                Midazolam 6 mg IV, Meperidine 125 mg IV,                            Ondansetron 4 mg IV Complications:            No immediate complications. Estimated Blood Loss:     Estimated blood loss: none. Procedure:                Pre-Anesthesia Assessment:                           - Prior to the procedure, a History and Physical                            was performed, and patient medications and                            allergies were reviewed. The patient's tolerance of                            previous anesthesia was also reviewed. The risks                            and benefits of the procedure and the sedation                            options and risks were discussed with the patient.                            All questions were answered, and informed consent                            was obtained. Prior Anticoagulants: The patient has                            taken no previous anticoagulant or antiplatelet  agents. ASA Grade Assessment: II - A patient with                            mild systemic disease. After reviewing the risks                            and benefits, the patient was deemed in                            satisfactory condition to undergo the procedure.                           After obtaining informed consent, the colonoscope                            was passed under direct  vision. Throughout the                            procedure, the patient's blood pressure, pulse, and                            oxygen saturations were monitored continuously. The                            EC-[id number] ([id number]) scope was introduced through                            the anus and advanced to the the cecum, identified                            by appendiceal orifice and ileocecal valve. The                            ileocecal valve, appendiceal orifice, and rectum                            were photographed. The quality of the bowel                            preparation was adequate. Scope In: 1:09:04 PM Scope Out: 1:19:58 PM Scope Withdrawal Time: 0 hours 7 minutes 39 seconds  Total Procedure Duration: 0 hours 10 minutes 54 seconds  Findings:      The perianal and digital rectal examinations were normal.      Multiple small and large-mouthed diverticula were found in the sigmoid       colon and descending colon.      The exam was otherwise without abnormality on direct and retroflexion       views. Impression:               - Diverticulosis in the sigmoid colon and in the                            descending colon.                           -  The examination was otherwise normal on direct                            and retroflexion views.                           - No specimens collected. Moderate Sedation:      Moderate (conscious) sedation was administered by the endoscopy nurse       and supervised by the endoscopist. The following parameters were       monitored: oxygen saturation, heart rate, blood pressure, respiratory       rate, EKG, adequacy of pulmonary ventilation, and response to care.       Total physician intraservice time was 28 minutes. Recommendation:           - Written discharge instructions were provided to                            the patient.                           - The signs and symptoms of potential delayed                             complications were discussed with the patient.                           - Patient has a contact number available for                            emergencies.                           - Return to normal activities tomorrow.                           - Continue present medications.                           - Repeat colonoscopy in 5 years for surveillance.                           - Return to GI clinic in 3 months. See EGD report.                           - Resume previous diet. Procedure Code(s):        --- Professional ---                           [phone], Colonoscopy, flexible; diagnostic, including                            collection of specimen(s) by brushing or washing,                            when performed (separate procedure)  [id number], Moderate sedation services provided by the                            same physician or other qualified health care                            professional performing the diagnostic or                            therapeutic service that the sedation supports,                            requiring the presence of an independent trained                            observer to assist in the monitoring of the                            patient's level of consciousness and physiological                            status; initial 15 minutes of intraservice time,                            patient age 45 years or older                           [phone], Moderate sedation services; each additional                            15 minutes intraservice time Diagnosis Code(s):        --- Professional ---                           Z86.010, Personal history of colonic polyps                           K57.30, Diverticulosis of large intestine without                            perforation or abscess without bleeding CPT copyright 2016 [employer]. All rights reserved. The codes documented in this report are preliminary and  upon coder review may  be revised to meet current compliance requirements. Gerrit Friends. Marshal Eskew, MD Gennette Pac, MD 05/01/2017 1:39:27 PM This report has been signed electronically. Number of Addenda: 0

## 2017-05-01 NOTE — Discharge Instructions (Addendum)
°Colonoscopy °Discharge Instructions ° °Read the instructions outlined below and refer to this sheet in the next few weeks. These discharge instructions provide you with general information on caring for yourself after you leave the hospital. Your doctor may also give you specific instructions. While your treatment has been planned according to the most current medical practices available, unavoidable complications occasionally occur. If you have any problems or questions after discharge, call Dr. Rourk at [phone]. °ACTIVITY °· You may resume your regular activity, but move at a slower pace for the next 24 hours.  °· Take frequent rest periods for the next 24 hours.  °· Walking will help get rid of the air and reduce the bloated feeling in your belly (abdomen).  °· No driving for 24 hours (because of the medicine (anesthesia) used during the test).   °· Do not sign any important legal documents or operate any machinery for 24 hours (because of the anesthesia used during the test).  °NUTRITION °· Drink plenty of fluids.  °· You may resume your normal diet as instructed by your doctor.  °· Begin with a light meal and progress to your normal diet. Heavy or fried foods are harder to digest and may make you feel sick to your stomach (nauseated).  °· Avoid alcoholic beverages for 24 hours or as instructed.  °MEDICATIONS °· You may resume your normal medications unless your doctor tells you otherwise.  °WHAT YOU CAN EXPECT TODAY °· Some feelings of bloating in the abdomen.  °· Passage of more gas than usual.  °· Spotting of blood in your stool or on the toilet paper.  °IF YOU HAD POLYPS REMOVED DURING THE COLONOSCOPY: °· No aspirin products for 7 days or as instructed.  °· No alcohol for 7 days or as instructed.  °· Eat a soft diet for the next 24 hours.  °FINDING OUT THE RESULTS OF YOUR TEST °Not all test results are available during your visit. If your test results are not back during the visit, make an appointment  with your caregiver to find out the results. Do not assume everything is normal if you have not heard from your caregiver or the medical facility. It is important for you to follow up on all of your test results.  °SEEK IMMEDIATE MEDICAL ATTENTION IF: °· You have more than a spotting of blood in your stool.  °· Your belly is swollen (abdominal distention).  °· You are nauseated or vomiting.  °· You have a temperature over 101.  °· You have abdominal pain or discomfort that is severe or gets worse throughout the day.  °EGD °Discharge instructions °Please read the instructions outlined below and refer to this sheet in the next few weeks. These discharge instructions provide you with general information on caring for yourself after you leave the hospital. Your doctor may also give you specific instructions. While your treatment has been planned according to the most current medical practices available, unavoidable complications occasionally occur. If you have any problems or questions after discharge, please call your doctor. °ACTIVITY °· You may resume your regular activity but move at a slower pace for the next 24 hours.  °· Take frequent rest periods for the next 24 hours.  °· Walking will help expel (get rid of) the air and reduce the bloated feeling in your abdomen.  °· No driving for 24 hours (because of the anesthesia (medicine) used during the test).  °· You may shower.  °· Do not sign any important   legal documents or operate any machinery for 24 hours (because of the anesthesia used during the test).  NUTRITION  Drink plenty of fluids.   You may resume your normal diet.   Begin with a light meal and progress to your normal diet.   Avoid alcoholic beverages for 24 hours or as instructed by your caregiver.  MEDICATIONS  You may resume your normal medications unless your caregiver tells you otherwise.  WHAT YOU CAN EXPECT TODAY  You may experience abdominal discomfort such as a feeling of fullness  or gas pains.  FOLLOW-UP  Your doctor will discuss the results of your test with you.  SEEK IMMEDIATE MEDICAL ATTENTION IF ANY OF THE FOLLOWING OCCUR:  Excessive nausea (feeling sick to your stomach) and/or vomiting.   Severe abdominal pain and distention (swelling).   Trouble swallowing.   Temperature over 101 F (37.8 C).   Rectal bleeding or vomiting of blood.    GERD information provided  Colon diverticulosis information provided  Continue Protonix 40 mg twice daily  Repeat colonoscopy in 5 years  Further recommendations to follow pending review of pathology report  Office visit with [place] in 3 months   Diverticulosis Diverticulosis is a condition that develops when small pouches (diverticula) form in the wall of the large intestine (colon). The colon is where water is absorbed and stool is formed. The pouches form when the inside layer of the colon pushes through weak spots in the outer layers of the colon. You may have a few pouches or many of them. What are the causes? The cause of this condition is not known. What increases the risk? The following factors may make you more likely to develop this condition:  Being older than age 43. Your risk for this condition increases with age. Diverticulosis is rare among people younger than age 86. By age 36, many people have it.  Eating a low-fiber diet.  Having frequent constipation.  Being overweight.  Not getting enough exercise.  Smoking.  Taking over-the-counter pain medicines, like aspirin and ibuprofen.  Having a family history of diverticulosis.  What are the signs or symptoms? In most people, there are no symptoms of this condition. If you do have symptoms, they may include:  Bloating.  Cramps in the abdomen.  Constipation or diarrhea.  Pain in the lower left side of the abdomen.  How is this diagnosed? This condition is most often diagnosed during an exam for other colon problems. Because  diverticulosis usually has no symptoms, it often cannot be diagnosed independently. This condition may be diagnosed by:  Using a flexible scope to examine the colon (colonoscopy).  Taking an X-ray of the colon after dye has been put into the colon (barium enema).  Doing a CT scan.  How is this treated? You may not need treatment for this condition if you have never developed an infection related to diverticulosis. If you have had an infection before, treatment may include:  Eating a high-fiber diet. This may include eating more fruits, vegetables, and grains.  Taking a fiber supplement.  Taking a live bacteria supplement (probiotic).  Taking medicine to relax your colon.  Taking antibiotic medicines.  Follow these instructions at home:  Drink 6-8 glasses of water or more each day to prevent constipation.  Try not to strain when you have a bowel movement.  If you have had an infection before: ? Eat more fiber as directed by your health care provider or your diet and nutrition specialist (dietitian). ?  Take a fiber supplement or probiotic, if your health care provider approves.  Take over-the-counter and prescription medicines only as told by your health care provider.  If you were prescribed an antibiotic, take it as told by your health care provider. Do not stop taking the antibiotic even if you start to feel better.  Keep all follow-up visits as told by your health care provider. This is important. Contact a health care provider if:  You have pain in your abdomen.  You have bloating.  You have cramps.  You have not had a bowel movement in 3 days. Get help right away if:  Your pain gets worse.  Your bloating becomes very bad.  You have a fever or chills, and your symptoms suddenly get worse.  You vomit.  You have bowel movements that are bloody or black.  You have bleeding from your rectum. Summary  Diverticulosis is a condition that develops when small  pouches (diverticula) form in the wall of the large intestine (colon).  You may have a few pouches or many of them.  This condition is most often diagnosed during an exam for other colon problems.  If you have had an infection related to diverticulosis, treatment may include increasing the fiber in your diet, taking supplements, or taking medicines. This information is not intended to replace advice given to you by your health care provider. Make sure you discuss any questions you have with your health care provider. Document Released: 08/03/2004 Document Revised: 09/25/2016 Document Reviewed: 09/25/2016 Elsevier Interactive Patient Education  2017 [employer].  Gastroesophageal Reflux Disease, Adult Normally, food travels down the esophagus and stays in the stomach to be digested. However, when a person has gastroesophageal reflux disease (GERD), food and stomach acid move back up into the esophagus. When this happens, the esophagus becomes sore and inflamed. Over time, GERD can create small holes (ulcers) in the lining of the esophagus. What are the causes? This condition is caused by a problem with the muscle between the esophagus and the stomach (lower esophageal sphincter, or LES). Normally, the LES muscle closes after food passes through the esophagus to the stomach. When the LES is weakened or abnormal, it does not close properly, and that allows food and stomach acid to go back up into the esophagus. The LES can be weakened by certain dietary substances, medicines, and medical conditions, including:  Tobacco use.  Pregnancy.  Having a hiatal hernia.  Heavy alcohol use.  Certain foods and beverages, such as coffee, chocolate, onions, and peppermint.  What increases the risk? This condition is more likely to develop in:  People who have an increased body weight.  People who have connective tissue disorders.  People who use NSAID medicines.  What are the signs or  symptoms? Symptoms of this condition include:  Heartburn.  Difficult or painful swallowing.  The feeling of having a lump in the throat.  Abitter taste in the mouth.  Bad breath.  Having a large amount of saliva.  Having an upset or bloated stomach.  Belching.  Chest pain.  Shortness of breath or wheezing.  Ongoing (chronic) cough or a night-time cough.  Wearing away of tooth enamel.  Weight loss.  Different conditions can cause chest pain. Make sure to see your health care provider if you experience chest pain. How is this diagnosed? Your health care provider will take a medical history and perform a physical exam. To determine if you have mild or severe GERD, your health care provider  may also monitor how you respond to treatment. You may also have other tests, including:  An endoscopy toexamine your stomach and esophagus with a small camera.  A test thatmeasures the acidity level in your esophagus.  A test thatmeasures how much pressure is on your esophagus.  A barium swallow or modified barium swallow to show the shape, size, and functioning of your esophagus.  How is this treated? The goal of treatment is to help relieve your symptoms and to prevent complications. Treatment for this condition may vary depending on how severe your symptoms are. Your health care provider may recommend:  Changes to your diet.  Medicine.  Surgery.  Follow these instructions at home: Diet  Follow a diet as recommended by your health care provider. This may involve avoiding foods and drinks such as: ? Coffee and tea (with or without caffeine). ? Drinks that containalcohol. ? Energy drinks and sports drinks. ? Carbonated drinks or sodas. ? Chocolate and cocoa. ? Peppermint and mint flavorings. ? Garlic and onions. ? Horseradish. ? Spicy and acidic foods, including peppers, chili powder, curry powder, vinegar, hot sauces, and barbecue sauce. ? Citrus fruit juices and  citrus fruits, such as oranges, lemons, and limes. ? Tomato-based foods, such as red sauce, chili, salsa, and pizza with red sauce. ? Fried and fatty foods, such as donuts, french fries, potato chips, and high-fat dressings. ? High-fat meats, such as hot dogs and fatty cuts of red and white meats, such as rib eye steak, sausage, ham, and bacon. ? High-fat dairy items, such as whole milk, butter, and cream cheese.  Eat small, frequent meals instead of large meals.  Avoid drinking large amounts of liquid with your meals.  Avoid eating meals during the 2-3 hours before bedtime.  Avoid lying down right after you eat.  Do not exercise right after you eat. General instructions  Pay attention to any changes in your symptoms.  Take over-the-counter and prescription medicines only as told by your health care provider. Do not take aspirin, ibuprofen, or other NSAIDs unless your health care provider told you to do so.  Do not use any tobacco products, including cigarettes, chewing tobacco, and e-cigarettes. If you need help quitting, ask your health care provider.  Wear loose-fitting clothing. Do not wear anything tight around your waist that causes pressure on your abdomen.  Raise (elevate) the head of your bed 6 inches (15cm).  Try to reduce your stress, such as with yoga or meditation. If you need help reducing stress, ask your health care provider.  If you are overweight, reduce your weight to an amount that is healthy for you. Ask your health care provider for guidance about a safe weight loss goal.  Keep all follow-up visits as told by your health care provider. This is important. Contact a health care provider if:  You have new symptoms.  You have unexplained weight loss.  You have difficulty swallowing, or it hurts to swallow.  You have wheezing or a persistent cough.  Your symptoms do not improve with treatment.  You have a hoarse voice. Get help right away if:  You have  pain in your arms, neck, jaw, teeth, or back.  You feel sweaty, dizzy, or light-headed.  You have chest pain or shortness of breath.  You vomit and your vomit looks like blood or coffee grounds.  You faint.  Your stool is bloody or black.  You cannot swallow, drink, or eat. This information is not intended to replace  advice given to you by your health care provider. Make sure you discuss any questions you have with your health care provider. Document Released: 08/16/2005 Document Revised: 04/05/2016 Document Reviewed: 03/03/2015 Elsevier Interactive Patient Education  2017 [employer].

## 2017-05-01 NOTE — H&P (Signed)
@LOGO @   Primary Care Physician:  Glenda Chroman, MD Primary Gastroenterologist:  Dr.   Pre-Procedure History & Physical: HPI:  Seth Savage is a 66 y.o. male here for EGD to further evaluate right upper quadrant abdominal pain-which has essentially resolved over the past 1 month. History colonic polyps; due for surveillance colonoscopy at this time. Patient denies dysphagia.  Past Medical History:  Diagnosis Date  . Arthritis   . Asthma    Childhood  . Carotid artery occlusion   . COPD (chronic obstructive pulmonary disease) ([place])   . Essential hypertension   . Goiter    Radioactive iodine treatment  . H pylori ulcer Jan 2016   Treated with Prevpac  . Headache    migraines - Topamax  . History of bronchitis   . History of kidney stones   . History of pneumonia   . Hypercholesterolemia   . Hypertension   . Hypothyroid   . Neuropathy   . Shortness of breath dyspnea    with exertion  . Wears glasses     Past Surgical History:  Procedure Laterality Date  . BACK SURGERY  2003  . CARDIAC CATHETERIZATION N/A 07/05/2016   Procedure: Left Heart Cath and Coronary Angiography;  Surgeon: Burnell Blanks, MD;  Location: [facility];  Service: Cardiovascular;  Laterality: N/A;  . CAROTID ENDARTERECTOMY Left   . COLECTOMY  2000   Secondary to intussuception  . COLONOSCOPY  08/24/2004   Normal rectum,Small polyp at 25 cm, cold snared/ The remainder of the colonic mucosa appeared normal  . COLONOSCOPY  09/24/2012   Dr. Mike Craze hemorrhoids-likely source of hematochezia.Colonic diverticulosis  . ENDARTERECTOMY Right 07/07/2016   Procedure: ENDARTERECTOMY CAROTID;  Surgeon: Rosetta Posner, MD;  [facility];  Service: Vascular;  Laterality: Right;  . ESOPHAGOGASTRODUODENOSCOPY N/A 12/09/2014   Dr. Gala Romney: Erosive reflux esophagitis. Peptic Ulcer disease secondary to H.pylori. small hiatal hernia. Nonbleeding duodenal AVM. Duodenal erosions. TREATED WITH PREVPAC  .  ESOPHAGOGASTRODUODENOSCOPY N/A 03/15/2015   Dr. Gala Romney: small hiatal hernia, healed PUD, duodenal AVM.   Marland Kitchen Multiple bilateral arm/wrist surgeries after falls    . PATCH ANGIOPLASTY Right 07/07/2016   Procedure: PATCH ANGIOPLASTY USING 0.8CM X 7.6CM HEMASHIELD PATCH;  Surgeon: Rosetta Posner, MD;  Location: [facility] [facility];  Service: Vascular;  Laterality: Right;  . ROTATOR CUFF REPAIR Left 1992   Metal implant   . ROTATOR CUFF REPAIR Right 1999  . SHOULDER SURGERY    . SPINE SURGERY      Prior to Admission medications   Medication Sig Start Date End Date Taking? Authorizing Provider  aspirin EC 81 MG tablet Take 81 mg by mouth at bedtime.    Yes [provider]  atorvastatin (LIPITOR) 10 MG tablet Take 1 tablet (10 mg total) by mouth daily. Patient taking differently: Take 10 mg by mouth at bedtime.  07/07/16  Yes Virgina Jock A, PA-C  fluticasone (FLONASE) 50 MCG/ACT nasal spray Place 1 spray into both nostrils at bedtime.    Yes [provider]  hydrochlorothiazide (HYDRODIURIL) 12.5 MG tablet Take 1 tablet by mouth at bedtime.  06/30/16  Yes [provider]  levothyroxine (SYNTHROID, LEVOTHROID) 175 MCG tablet Take 175 mcg by mouth daily before breakfast.   Yes [provider]  losartan (COZAAR) 100 MG tablet Take 100 mg by mouth at bedtime.  08/28/12  Yes [provider]  pantoprazole (PROTONIX) 40 MG tablet Take 1 tablet (40 mg total) by mouth daily.  Patient taking differently: Take 40 mg by mouth 2 (two) times daily.  09/06/15  Yes Mahala Menghini, PA-C  polyethylene glycol-electrolytes (TRILYTE) 420 g solution Take 4,000 mLs by mouth as directed. 03/23/17  Yes Rourk, Cristopher Estimable, MD  topiramate (TOPAMAX) 100 MG tablet Take 100 mg by mouth daily as needed (migraines).    [provider]    Allergies as of 03/23/2017 - Review Complete 03/23/2017  Allergen Reaction Noted  . Lyrica [pregabalin] Shortness Of Breath 09/17/2012    Family History   Problem Relation Age of Onset  . COPD Mother   . Mental illness Mother   . Hypertension Mother   . Varicose Veins Mother   . Heart attack Mother 60  . Hypertension Father   . Alcohol abuse Father   . Colon cancer Neg Hx     Social History   Social History  . Marital status: Married    Spouse name: [name]  . Number of children: 0  . Years of education: Ged   Occupational History  .      Disabled   Social History Main Topics  . Smoking status: Former Smoker    Packs/day: 0.20    Years: 45.00    Types: Cigarettes    Quit date: 09/25/2015  . Smokeless tobacco: Never Used  . Alcohol use 0.0 oz/week     Comment: 2 beers per month, rarely   . Drug use: No  . Sexual activity: Not on file   Other Topics Concern  . Not on file   Social History Narrative   Patient lives at home with his wife[facility]    Disabled.   Right handed.   Caffeine- two cups daily.   Three step children.    Review of Systems: See HPI, otherwise negative ROS  Physical Exam: BP (!) 143/79   Pulse 75   Temp 97.8 F (36.6 C) (Oral)   Resp 16   Ht 6' (1.829 m)   Wt 226 lb (102.5 kg)   SpO2 98%   BMI 30.65 kg/m  General:   Alert,  Well-developed, well-nourished, pleasant and cooperative in NAD Neck:  Supple; no masses or thyromegaly. No significant cervical adenopathy. Lungs:  Clear throughout to auscultation.   No wheezes, crackles, or rhonchi. No acute distress. Heart:  Regular rate and rhythm; no murmurs, clicks, rubs,  or gallops. Abdomen: Non-distended, normal bowel sounds.  Soft and nontender without appreciable mass or hepatosplenomegaly.  Pulses:  Normal pulses noted. Extremities:  Without clubbing or edema.  Impression:  Pleasant 66 year old gentleman with a history of right upper quadrant abdominal pain which is improved significantly-essentially resolved over the past 4 weeks since last seen in the office. History of colonic adenoma; due for some aunts colonoscopy at this  time.   Recommendations:   I have offered the patient an EGD and colonoscopy per plan.  The risks, benefits, limitations, imponderables and alternatives regarding both EGD and colonoscopy have been reviewed with the patient. Questions have been answered. All parties agreeable.   Further recommendations to follow.                      Notice: This dictation was prepared with Dragon dictation along with smaller phrase technology. Any transcriptional errors that result from this process are unintentional and may not be corrected upon review.

## 2017-05-01 NOTE — Op Note (Signed)
[facility] Patient Name: Seth Savage Procedure Date: 05/01/2017 12:40 PM MRN: [id number] Date of Birth: Jun 19, 1951 Attending MD: Gennette Pac , MD CSN: [id number] Age: 66 Admit Type: Outpatient Procedure:                Upper GI endoscopy Indications:              Abdominal pain in the right upper quadrant- Better                            since beginning twice a day PPI therapy. Providers:                Gennette Pac, MD, Criselda Peaches. Mathis Fare RN, RN,                            Burke Keels, Technician Referring MD:              Medicines:                Midazolam 5 mg IV, Meperidine 100 mg IV,                            Ondansetron 4 mg IV Complications:            No immediate complications. Estimated Blood Loss:     Estimated blood loss was minimal. Procedure:                Pre-Anesthesia Assessment:                           - Prior to the procedure, a History and Physical                            was performed, and patient medications and                            allergies were reviewed. The patient's tolerance of                            previous anesthesia was also reviewed. The risks                            and benefits of the procedure and the sedation                            options and risks were discussed with the patient.                            All questions were answered, and informed consent                            was obtained. Prior Anticoagulants: The patient has                            taken no previous anticoagulant or antiplatelet  agents. ASA Grade Assessment: II - A patient with                            mild systemic disease. After reviewing the risks                            and benefits, the patient was deemed in                            satisfactory condition to undergo the procedure.                           After obtaining informed consent, the endoscope was        passed under direct vision. Throughout the                            procedure, the patient's blood pressure, pulse, and                            oxygen saturations were monitored continuously. The                            EG-[id number] ([id number]) scope was introduced through the                            mouth, and advanced to the second part of duodenum.                            The upper GI endoscopy was accomplished without                            difficulty. The patient tolerated the procedure                            well. Scope In: 12:59:02 PM Scope Out: 1:03:25 PM Total Procedure Duration: 0 hours 4 minutes 23 seconds  Findings:      LA Grade A (one or more mucosal breaks less than 5 mm, not extending       between tops of 2 mucosal folds) esophagitis was found. No Barrett's       epithelium seen.      Multiple erosions were found in the gastric antrum. This was biopsied       with a cold forceps for histology. Estimated blood loss was minimal.      The duodenal bulb and second portion of the duodenum were normal. Impression:               - LA Grade A esophagitis. Reflux likely explains                            right upper quadrant abdominal pain recently.                           - Erosive gastropathy. Biopsied.                           -  Normal duodenal bulb and second portion of the                            duodenum. Moderate Sedation:      Moderate (conscious) sedation was administered by the endoscopy nurse       and supervised by the endoscopist. The following parameters were       monitored: oxygen saturation, heart rate, blood pressure, respiratory       rate, EKG, adequacy of pulmonary ventilation, and response to care.       Total physician intraservice time was 13 minutes. Recommendation:           - Patient has a contact number available for                            emergencies. The signs and symptoms of potential                             delayed complications were discussed with the                            patient. Return to normal activities tomorrow.                            Written discharge instructions were provided to the                            patient.                           - Resume previous diet.                           - Continue present medications. Continue Protonix                            40 mg twice daily.                           - Await pathology results. Office visit with [place] in                            3 months Procedure Code(s):        --- Professional ---                           [phone], Esophagogastroduodenoscopy, flexible,                            transoral; with biopsy, single or multiple                           99152, Moderate sedation services provided by the                            same physician or other qualified health care  professional performing the diagnostic or                            therapeutic service that the sedation supports,                            requiring the presence of an independent trained                            observer to assist in the monitoring of the                            patient's level of consciousness and physiological                            status; initial 15 minutes of intraservice time,                            patient age 24 years or older Diagnosis Code(s):        --- Professional ---                           K20.9, Esophagitis, unspecified                           K31.89, Other diseases of stomach and duodenum                           R10.11, Right upper quadrant pain CPT copyright 2016 American Medical Association. All rights reserved. The codes documented in this report are preliminary and upon coder review may  be revised to meet current compliance requirements. Gerrit Friends. Corwyn Vora, MD Gennette Pac, MD 05/01/2017 1:26:30 PM This report has been signed electronically. Number of  Addenda: 0

## 2017-05-04 ENCOUNTER — Encounter (HOSPITAL_COMMUNITY): Payer: Self-pay | Admitting: Internal Medicine

## 2017-05-04 ENCOUNTER — Encounter: Payer: Self-pay | Admitting: Internal Medicine

## 2017-08-01 ENCOUNTER — Ambulatory Visit (INDEPENDENT_AMBULATORY_CARE_PROVIDER_SITE_OTHER): Payer: Medicare Other | Admitting: Gastroenterology

## 2017-08-01 ENCOUNTER — Encounter: Payer: Self-pay | Admitting: Gastroenterology

## 2017-08-01 VITALS — BP 148/82 | HR 78 | Temp 97.0°F | Ht 72.0 in | Wt 225.4 lb

## 2017-08-01 DIAGNOSIS — R1011 Right upper quadrant pain: Secondary | ICD-10-CM

## 2017-08-01 DIAGNOSIS — K2100 Gastro-esophageal reflux disease with esophagitis, without bleeding: Secondary | ICD-10-CM

## 2017-08-01 NOTE — Progress Notes (Signed)
cc'ed to pcp °

## 2017-08-01 NOTE — Assessment & Plan Note (Signed)
66 year old gentleman with history of right upper quadrant pain earlier this year, underwent EGD and colonoscopy and found to have LA grade a esophagitis and gastritis. Patient clinically is doing well. He is on protonix once daily. Encouraged him to continue. Discussed anti-reflex measures. He'll we due for his next colonoscopy in 5 years given his history of colon polyps in the past. Return to our office as needed.

## 2017-08-01 NOTE — Progress Notes (Signed)
Primary Care Physician: Glenda Chroman, MD  Primary Gastroenterologist:  Garfield Cornea, MD   Chief Complaint  Patient presents with  . Abdominal Pain    ruq occ; pp f/u    HPI: Seth Savage is a 66 y.o. male here for follow-up. He is seen back in May with complaints of right upper quadrant pain and remote colon polyps. He has a history of peptic ulcer disease secondary to H. pylori in January 2016. Surveillance EGD in April 2016 with ulcer completely healed. History of duodenal AVM. Patient had right upper quadrant ultrasound in March showing fatty liver. HIDA scan unremarkable.  EGD and colonoscopy in June showed LA grade a esophagitis, gastritis but no H pylori, colonic diverticulosis. Plans for next colonoscopy in 5 years given history of colon polyps in the past.  Clinically he is doing well. Right upper quadrant pain resolved. Has only had 2 episodes in the past 4-5 months. Reflux well controlled. No bowel concerns. No blood in the stool or melena. Taking Protonix once daily.     Current Outpatient Prescriptions  Medication Sig Dispense Refill  . aspirin EC 81 MG tablet Take 81 mg by mouth at bedtime.     Marland Kitchen atorvastatin (LIPITOR) 10 MG tablet Take 1 tablet (10 mg total) by mouth daily. (Patient taking differently: Take 10 mg by mouth at bedtime. ) 30 tablet 11  . fluticasone (FLONASE) 50 MCG/ACT nasal spray Place 1 spray into both nostrils as needed for allergies.     . hydrochlorothiazide (HYDRODIURIL) 12.5 MG tablet Take 1 tablet by mouth at bedtime.     Marland Kitchen levothyroxine (SYNTHROID, LEVOTHROID) 175 MCG tablet Take 175 mcg by mouth daily before breakfast.    . losartan (COZAAR) 100 MG tablet Take 100 mg by mouth at bedtime.     . pantoprazole (PROTONIX) 40 MG tablet Take 1 tablet (40 mg total) by mouth daily. 30 tablet 11   No current facility-administered medications for this visit.     Allergies as of 08/01/2017 - Review Complete 08/01/2017  Allergen Reaction  Noted  . Lyrica [pregabalin] Shortness Of Breath 09/17/2012  . Celecoxib    . Codeine Itching 11/09/2014    ROS:  General: Negative for anorexia, weight loss, fever, chills, fatigue, weakness. ENT: Negative for hoarseness, difficulty swallowing , nasal congestion. CV: Negative for chest pain, angina, palpitations, dyspnea on exertion, peripheral edema.  Respiratory: Negative for dyspnea at rest, dyspnea on exertion, cough, sputum, wheezing.  GI: See history of present illness. GU:  Negative for dysuria, hematuria, urinary incontinence, urinary frequency, nocturnal urination.  Endo: Negative for unusual weight change.    Physical Examination:   BP (!) 148/82   Pulse 78   Temp (!) 97 F (36.1 C) (Oral)   Ht 6' (1.829 m)   Wt 225 lb 6.4 oz (102.2 kg)   BMI 30.57 kg/m   General: Well-nourished, well-developed in no acute distress.  Eyes: No icterus. Mouth: Oropharyngeal mucosa moist and pink , no lesions erythema or exudate. Lungs: Clear to auscultation bilaterally.  Heart: Regular rate and rhythm, no murmurs rubs or gallops.  Abdomen: Bowel sounds are normal, nontender, nondistended, no hepatosplenomegaly or masses, no abdominal bruits or hernia , no rebound or guarding.   Extremities: No lower extremity edema. No clubbing or deformities. Neuro: Alert and oriented x 4   Skin: Warm and dry, no jaundice.   Psych: Alert and cooperative, normal mood and affect.    Imaging Studies: No results  found.

## 2017-08-01 NOTE — Patient Instructions (Signed)
1. Continue pantoprazole once daily. 2. Next colonoscopy will be due in 5 years. We will send a reminder in the mail. 3. Call us if you have any questions or concerns.

## 2017-08-07 ENCOUNTER — Ambulatory Visit (INDEPENDENT_AMBULATORY_CARE_PROVIDER_SITE_OTHER): Payer: Medicare Other | Admitting: Family

## 2017-08-07 ENCOUNTER — Ambulatory Visit (HOSPITAL_COMMUNITY)
Admission: RE | Admit: 2017-08-07 | Discharge: 2017-08-07 | Disposition: A | Discharge status: Home / Self Care | Payer: Medicare Other | Source: Ambulatory Visit | Attending: Vascular Surgery | Admitting: Vascular Surgery

## 2017-08-07 ENCOUNTER — Encounter: Payer: Self-pay | Admitting: Family

## 2017-08-07 VITALS — BP 148/56 | HR 71 | Temp 97.1°F | Ht 72.0 in | Wt 226.0 lb

## 2017-08-07 DIAGNOSIS — I6523 Occlusion and stenosis of bilateral carotid arteries: Secondary | ICD-10-CM | POA: Diagnosis not present

## 2017-08-07 DIAGNOSIS — Z9889 Other specified postprocedural states: Secondary | ICD-10-CM | POA: Diagnosis not present

## 2017-08-07 LAB — VAS US CAROTID
LCCAPDIAS: 41 cm/s
LEFT ECA DIAS: 16 cm/s
LEFT VERTEBRAL DIAS: -12 cm/s
LICAPDIAS: -20 cm/s
Left CCA dist dias: 41 cm/s
Left CCA dist sys: 147 cm/s
Left CCA prox sys: 144 cm/s
Left ICA dist dias: -38 cm/s
Left ICA dist sys: -95 cm/s
Left ICA prox sys: -81 cm/s
RCCADSYS: -65 cm/s
RCCAPDIAS: 22 cm/s
RCCAPSYS: 119 cm/s
RIGHT CCA MID DIAS: 23 cm/s
RIGHT ECA DIAS: -47 cm/s

## 2017-08-07 NOTE — Patient Instructions (Signed)
Stroke Prevention Some medical conditions and behaviors are associated with an increased chance of having a stroke. You may prevent a stroke by making healthy choices and managing medical conditions. How can I reduce my risk of having a stroke?  Stay physically active. Get at least 30 minutes of activity on most or all days.  Do not smoke. It may also be helpful to avoid exposure to secondhand smoke.  Limit alcohol use. Moderate alcohol use is considered to be:  No more than 2 drinks per day for men.  No more than 1 drink per day for nonpregnant women.  Eat healthy foods. This involves:  Eating 5 or more servings of fruits and vegetables a day.  Making dietary changes that address high blood pressure (hypertension), high cholesterol, diabetes, or obesity.  Manage your cholesterol levels.  Making food choices that are high in fiber and low in saturated fat, trans fat, and cholesterol may control cholesterol levels.  Take any prescribed medicines to control cholesterol as directed by your health care provider.  Manage your diabetes.  Controlling your carbohydrate and sugar intake is recommended to manage diabetes.  Take any prescribed medicines to control diabetes as directed by your health care provider.  Control your hypertension.  Making food choices that are low in salt (sodium), saturated fat, trans fat, and cholesterol is recommended to manage hypertension.  Ask your health care provider if you need treatment to lower your blood pressure. Take any prescribed medicines to control hypertension as directed by your health care provider.  If you are 18-39 years of age, have your blood pressure checked every 3-5 years. If you are 40 years of age or older, have your blood pressure checked every year.  Maintain a healthy weight.  Reducing calorie intake and making food choices that are low in sodium, saturated fat, trans fat, and cholesterol are recommended to manage  weight.  Stop drug abuse.  Avoid taking birth control pills.  Talk to your health care provider about the risks of taking birth control pills if you are over 35 years old, smoke, get migraines, or have ever had a blood clot.  Get evaluated for sleep disorders (sleep apnea).  Talk to your health care provider about getting a sleep evaluation if you snore a lot or have excessive sleepiness.  Take medicines only as directed by your health care provider.  For some people, aspirin or blood thinners (anticoagulants) are helpful in reducing the risk of forming abnormal blood clots that can lead to stroke. If you have the irregular heart rhythm of atrial fibrillation, you should be on a blood thinner unless there is a good reason you cannot take them.  Understand all your medicine instructions.  Make sure that other conditions (such as anemia or atherosclerosis) are addressed. Get help right away if:  You have sudden weakness or numbness of the face, arm, or leg, especially on one side of the body.  Your face or eyelid droops to one side.  You have sudden confusion.  You have trouble speaking (aphasia) or understanding.  You have sudden trouble seeing in one or both eyes.  You have sudden trouble walking.  You have dizziness.  You have a loss of balance or coordination.  You have a sudden, severe headache with no known cause.  You have new chest pain or an irregular heartbeat. Any of these symptoms may represent a serious problem that is an emergency. Do not wait to see if the symptoms will go away.   Get medical help at once. Call your local emergency services (911 in [place] Do not drive yourself to the hospital. This information is not intended to replace advice given to you by your health care provider. Make sure you discuss any questions you have with your health care provider. Document Released: 12/14/2004 Document Revised: 04/13/2016 Document Reviewed: 05/09/2013 Elsevier  Interactive Patient Education  2017 Elsevier Inc.  

## 2017-08-07 NOTE — Progress Notes (Signed)
Chief Complaint: Follow up Extracranial Carotid Artery Stenosis   History of Present Illness  Seth Savage is a 66 y.o. male who is s/p right carotid endarterectomy for asymptomatic disease on 07/07/2016 by Dr. Donnetta Hutching. He also has a history of left CEA in 2009.   Dr. Donnetta Hutching last evaluated pt on 01-23-17. At that time carotid duplex revealed no evidence of recurrent stenosis. Endarterectomy sites were widely patent bilaterally. Stable status post bilateral carotid endarterectomies most recently August 2017 on the right. He will continue his usual activities. We will see him again in 6 months with repeat duplex and then drop back to yearly assuming this is normal.   I last evaluated pt on 08-11-16. At that time he had a mild hematoma from the mid to the distal portion of the incision, no signs of infection. Advised warm moist compress to right side of neck incision 2-3x/day to expedite the hematoma draining. The patient's neck incision is well healed, hematoma has resolved.    He has a hx of hyperthyroidism, treated with radioactive iodine about 1985, has exophthalmos from that.   He denies any known history of stroke or TIA. Specifically he denies a history of amaurosis fugax or monocular blindness, unilateral facial drooping, hemiplegia, or receptive or expressive aphasia.    He denies any claudication sx's with walking.   Pt Diabetic: no Pt smoker: former smoker, started at age 55 years, quit again, last cigarette was 3 days ago  Pt meds include: Statin : yes ASA: yes Other anticoagulants/antiplatelets: no   Past Medical History:  Diagnosis Date  . Arthritis   . Asthma    Childhood  . Carotid artery occlusion   . COPD (chronic obstructive pulmonary disease) ([place])   . Essential hypertension   . Goiter    Radioactive iodine treatment  . H pylori ulcer Jan 2016   Treated with Prevpac  . Headache    migraines - Topamax  . History of bronchitis   . History of kidney  stones   . History of pneumonia   . Hypercholesterolemia   . Hypertension   . Hypothyroid   . Neuropathy   . Shortness of breath dyspnea    with exertion  . Wears glasses     Social History Social History  Substance Use Topics  . Smoking status: Current Some Day Smoker    Packs/day: 0.20    Years: 45.00    Types: Cigarettes    Last attempt to quit: 09/25/2015  . Smokeless tobacco: Never Used     Comment: started back  . Alcohol use 0.0 oz/week     Comment: 2 beers per month, rarely     Family History Family History  Problem Relation Age of Onset  . COPD Mother   . Mental illness Mother   . Hypertension Mother   . Varicose Veins Mother   . Heart attack Mother 11  . Hypertension Father   . Alcohol abuse Father   . Colon cancer Neg Hx     Surgical History Past Surgical History:  Procedure Laterality Date  . BACK SURGERY  2003  . BIOPSY  05/01/2017   Procedure: BIOPSY;  Surgeon: Daneil Dolin, MD;  Location: [facility];  Service: Endoscopy;;  gastric  . CARDIAC CATHETERIZATION N/A 07/05/2016   Procedure: Left Heart Cath and Coronary Angiography;  Surgeon: Burnell Blanks, MD;  Location: [facility];  Service: Cardiovascular;  Laterality: N/A;  . CAROTID ENDARTERECTOMY Left   .  COLECTOMY  2000   Secondary to intussuception  . COLONOSCOPY  08/24/2004   Normal rectum,Small polyp at 25 cm, cold snared/ The remainder of the colonic mucosa appeared normal  . COLONOSCOPY  09/24/2012   Dr. Mike Craze hemorrhoids-likely source of hematochezia.Colonic diverticulosis  . COLONOSCOPY N/A 05/01/2017   Dr. Gala Romney: Diverticulosis, next colonoscopy in 5 years.  Marland Kitchen ENDARTERECTOMY Right 07/07/2016   Procedure: ENDARTERECTOMY CAROTID;  Surgeon: Rosetta Posner, MD;  Location: [facility];  Service: Vascular;  Laterality: Right;  . ESOPHAGOGASTRODUODENOSCOPY N/A 12/09/2014   Dr. Gala Romney: Erosive reflux esophagitis. Peptic Ulcer disease secondary to H.pylori. small hiatal hernia.  Nonbleeding duodenal AVM. Duodenal erosions. TREATED WITH PREVPAC  . ESOPHAGOGASTRODUODENOSCOPY N/A 03/15/2015   Dr. Gala Romney: small hiatal hernia, healed PUD, duodenal AVM.   Marland Kitchen ESOPHAGOGASTRODUODENOSCOPY N/A 05/01/2017   Dr. Gala Romney: LA grade a esophagitis, gastritis, no H pylori  . Multiple bilateral arm/wrist surgeries after falls    . PATCH ANGIOPLASTY Right 07/07/2016   Procedure: PATCH ANGIOPLASTY USING 0.8CM X 7.6CM HEMASHIELD PATCH;  Surgeon: Rosetta Posner, MD;  Location: [facility];  Service: Vascular;  Laterality: Right;  . ROTATOR CUFF REPAIR Left 1992   Metal implant   . ROTATOR CUFF REPAIR Right 1999  . SHOULDER SURGERY    . SPINE SURGERY      Allergies  Allergen Reactions  . Lyrica [Pregabalin] Shortness Of Breath  . Celecoxib     Other reaction(s): fatigue ? GI BLEED  . Codeine Itching    Current Outpatient Prescriptions  Medication Sig Dispense Refill  . aspirin EC 81 MG tablet Take 81 mg by mouth at bedtime.     Marland Kitchen atorvastatin (LIPITOR) 10 MG tablet Take 1 tablet (10 mg total) by mouth daily. (Patient taking differently: Take 10 mg by mouth at bedtime. ) 30 tablet 11  . buPROPion (WELLBUTRIN SR) 150 MG 12 hr tablet 150 mg 2 (two) times daily.     . fluticasone (FLONASE) 50 MCG/ACT nasal spray Place 1 spray into both nostrils as needed for allergies.     . hydrochlorothiazide (HYDRODIURIL) 12.5 MG tablet Take 1 tablet by mouth at bedtime.     Marland Kitchen levothyroxine (SYNTHROID, LEVOTHROID) 175 MCG tablet Take 175 mcg by mouth daily before breakfast.    . losartan (COZAAR) 100 MG tablet Take 100 mg by mouth at bedtime.     . pantoprazole (PROTONIX) 40 MG tablet Take 1 tablet (40 mg total) by mouth daily. 30 tablet 11   No current facility-administered medications for this visit.     Review of Systems : See HPI for pertinent positives and negatives.  Physical Examination  Vitals:   08/07/17 1032 08/07/17 1040  BP: (!) 152/84 (!) 148/56  Pulse: 71   Temp: (!) 97.1 F (36.2 C)    TempSrc: Oral   SpO2: 96%   Weight: 226 lb (102.5 kg)   Height: 6' (1.829 m)    Body mass index is 30.65 kg/m.  General: WDWN obese male in NAD GAIT: normal Eyes: PERRLA Pulmonary:  Respirations are non-labored, good air movement, CTAB, no rales, rhonchi, or wheezing.  Cardiac: regular rhythm, no detected murmur.  VASCULAR EXAM Carotid Bruits Right Left   Negative Negative     Abdominal aortic pulse is not palpable. Radial pulses: 1+ right, 2+ left palpable.  LE Pulses Right Left       POPLITEAL  not palpable   not palpable       POSTERIOR TIBIAL   palpable    palpable        DORSALIS PEDIS      ANTERIOR TIBIAL not palpable  not palpable     Gastrointestinal: soft, nontender, BS WNL, no r/g, no palpable masses.  Musculoskeletal: No muscle atrophy/wasting. M/S 5/5 throughout, extremities without ischemic changes. Well healed scar, slight malformation right wrist from injury and surgery.  Neurologic:  A&O X 3; appropriate affect, sensation is normal; speech is normal, CN 2-12 intact, pain and light touch intact in extremities, motor exam as listed above.    Assessment: Seth Savage is a 66 y.o. male who is s/p right carotid endarterectomy for asymptomatic disease on 07/07/2016 by Dr. Donnetta Hutching. He also has a history of left CEA in 2009.  He has no hx of stroke or TIA.   DATA  Carotid Duplex (08-07-17):  Right ICA: CEA site with 1-39% stenosis.  Left ICA: CEA site with 1-39% stenosis.  Right ECA with >50% stenosis. Bilateral vertebral artery flow is antegrade.  Bilateral subclavian artery waveforms are normal.  No significant change compared to the last exam on 01-23-17.    Plan: Follow-up in 1 year with Carotid Duplex scan.   I discussed in depth with the patient the nature of atherosclerosis, and emphasized the importance of maximal  medical management including strict control of blood pressure, blood glucose, and lipid levels, obtaining regular exercise, and continued cessation of smoking.  The patient is aware that without maximal medical management the underlying atherosclerotic disease process will progress, limiting the benefit of any interventions. The patient was given information about stroke prevention and what symptoms should prompt the patient to seek immediate medical care. Thank you for allowing [place] to participate in this patient's care.  Clemon Chambers, RN, MSN, FNP-C [facility] Office: [facility] Physician: Early  08/07/17 10:42 AM

## 2017-10-17 NOTE — Addendum Note (Signed)
Addended by: Lianne Cure A on: 10/17/2017 11:06 AM   Modules accepted: Orders

## 2018-02-27 ENCOUNTER — Encounter: Payer: Self-pay | Admitting: Internal Medicine

## 2018-03-12 ENCOUNTER — Emergency Department (HOSPITAL_COMMUNITY): Payer: Medicare Other

## 2018-03-12 ENCOUNTER — Inpatient Hospital Stay (HOSPITAL_COMMUNITY)
Admission: EM | Disposition: A | Discharge status: Home / Self Care | Payer: Self-pay | Source: Home / Self Care | Attending: Cardiology

## 2018-03-12 ENCOUNTER — Encounter (HOSPITAL_COMMUNITY): Payer: Self-pay | Admitting: Emergency Medicine

## 2018-03-12 ENCOUNTER — Other Ambulatory Visit: Payer: Self-pay

## 2018-03-12 ENCOUNTER — Inpatient Hospital Stay (HOSPITAL_COMMUNITY)
Admission: EM | Admit: 2018-03-12 | Discharge: 2018-03-13 | DRG: 287 | Disposition: A | Discharge status: Home / Self Care | Payer: Medicare Other | Attending: Cardiology | Admitting: Cardiology

## 2018-03-12 DIAGNOSIS — G43909 Migraine, unspecified, not intractable, without status migrainosus: Secondary | ICD-10-CM | POA: Diagnosis present

## 2018-03-12 DIAGNOSIS — Z7982 Long term (current) use of aspirin: Secondary | ICD-10-CM | POA: Diagnosis not present

## 2018-03-12 DIAGNOSIS — I200 Unstable angina: Secondary | ICD-10-CM | POA: Diagnosis present

## 2018-03-12 DIAGNOSIS — I10 Essential (primary) hypertension: Secondary | ICD-10-CM | POA: Diagnosis not present

## 2018-03-12 DIAGNOSIS — E7800 Pure hypercholesterolemia, unspecified: Secondary | ICD-10-CM | POA: Diagnosis not present

## 2018-03-12 DIAGNOSIS — Z888 Allergy status to other drugs, medicaments and biological substances status: Secondary | ICD-10-CM

## 2018-03-12 DIAGNOSIS — K219 Gastro-esophageal reflux disease without esophagitis: Secondary | ICD-10-CM | POA: Diagnosis not present

## 2018-03-12 DIAGNOSIS — I25110 Atherosclerotic heart disease of native coronary artery with unstable angina pectoris: Secondary | ICD-10-CM | POA: Diagnosis not present

## 2018-03-12 DIAGNOSIS — R072 Precordial pain: Secondary | ICD-10-CM | POA: Diagnosis not present

## 2018-03-12 DIAGNOSIS — Z79890 Hormone replacement therapy (postmenopausal): Secondary | ICD-10-CM

## 2018-03-12 DIAGNOSIS — F17210 Nicotine dependence, cigarettes, uncomplicated: Secondary | ICD-10-CM | POA: Diagnosis present

## 2018-03-12 DIAGNOSIS — Z885 Allergy status to narcotic agent status: Secondary | ICD-10-CM

## 2018-03-12 DIAGNOSIS — Z9049 Acquired absence of other specified parts of digestive tract: Secondary | ICD-10-CM

## 2018-03-12 DIAGNOSIS — R079 Chest pain, unspecified: Secondary | ICD-10-CM | POA: Diagnosis present

## 2018-03-12 DIAGNOSIS — Z8249 Family history of ischemic heart disease and other diseases of the circulatory system: Secondary | ICD-10-CM | POA: Diagnosis not present

## 2018-03-12 DIAGNOSIS — Z825 Family history of asthma and other chronic lower respiratory diseases: Secondary | ICD-10-CM

## 2018-03-12 DIAGNOSIS — E890 Postprocedural hypothyroidism: Secondary | ICD-10-CM | POA: Diagnosis present

## 2018-03-12 DIAGNOSIS — R0789 Other chest pain: Secondary | ICD-10-CM | POA: Diagnosis not present

## 2018-03-12 DIAGNOSIS — Z886 Allergy status to analgesic agent status: Secondary | ICD-10-CM

## 2018-03-12 DIAGNOSIS — J449 Chronic obstructive pulmonary disease, unspecified: Secondary | ICD-10-CM | POA: Diagnosis present

## 2018-03-12 DIAGNOSIS — G629 Polyneuropathy, unspecified: Secondary | ICD-10-CM | POA: Diagnosis present

## 2018-03-12 HISTORY — DX: Unspecified injury of lower back, initial encounter: S39.92XA

## 2018-03-12 HISTORY — DX: Migraine, unspecified, not intractable, without status migrainosus: G43.909

## 2018-03-12 HISTORY — DX: Unspecified asthma, uncomplicated: J45.909

## 2018-03-12 HISTORY — DX: Gastro-esophageal reflux disease without esophagitis: K21.9

## 2018-03-12 HISTORY — PX: CARDIAC CATHETERIZATION: SHX172

## 2018-03-12 HISTORY — PX: LEFT HEART CATH AND CORONARY ANGIOGRAPHY: CATH118249

## 2018-03-12 HISTORY — DX: Pneumonia, unspecified organism: J18.9

## 2018-03-12 LAB — CBC
HCT: 45.7 % (ref 39.0–52.0)
Hemoglobin: 15 g/dL (ref 13.0–17.0)
MCH: 29.1 pg (ref 26.0–34.0)
MCHC: 32.8 g/dL (ref 30.0–36.0)
MCV: 88.7 fL (ref 78.0–100.0)
Platelets: 182 10*3/uL (ref 150–400)
RBC: 5.15 MIL/uL (ref 4.22–5.81)
RDW: 12.9 % (ref 11.5–15.5)
WBC: 7 10*3/uL (ref 4.0–10.5)

## 2018-03-12 LAB — BASIC METABOLIC PANEL
Anion gap: 13 (ref 5–15)
BUN: 17 mg/dL (ref 6–20)
CALCIUM: 9.4 mg/dL (ref 8.9–10.3)
CO2: 24 mmol/L (ref 22–32)
CREATININE: 1.18 mg/dL (ref 0.61–1.24)
Chloride: 101 mmol/L (ref 101–111)
GFR calc Af Amer: >60 mL/min (ref >60)
GFR calc non Af Amer: >60 mL/min (ref >60)
GLUCOSE: 124 mg/dL — AB (ref 65–99)
Potassium: 4.1 mmol/L (ref 3.5–5.1)
Sodium: 138 mmol/L (ref 135–145)

## 2018-03-12 LAB — HEPATIC FUNCTION PANEL
ALT: 25 U/L (ref 17–63)
AST: 17 U/L (ref 15–41)
Albumin: 4.1 g/dL (ref 3.5–5.0)
Alkaline Phosphatase: 105 U/L (ref 38–126)
BILIRUBIN DIRECT: 0.1 mg/dL (ref 0.1–0.5)
BILIRUBIN INDIRECT: 0.7 mg/dL (ref 0.3–0.9)
Total Bilirubin: 0.8 mg/dL (ref 0.3–1.2)
Total Protein: 7.3 g/dL (ref 6.5–8.1)

## 2018-03-12 LAB — TROPONIN I

## 2018-03-12 LAB — APTT: aPTT: 28 seconds (ref 24–36)

## 2018-03-12 SURGERY — LEFT HEART CATH AND CORONARY ANGIOGRAPHY
Anesthesia: LOCAL

## 2018-03-12 MED ORDER — SODIUM CHLORIDE 0.9 % IV SOLN: INTRAVENOUS | Status: DC

## 2018-03-12 MED ORDER — ASPIRIN 81 MG PO CHEW: 81.0000 mg | CHEWABLE_TABLET | ORAL | Status: DC

## 2018-03-12 MED ORDER — HEPARIN (PORCINE) IN NACL 100-0.45 UNIT/ML-% IJ SOLN
1200.0000 [IU]/h | INTRAMUSCULAR | Status: DC
  Administered 2018-03-12: 1200 [IU]/h via INTRAVENOUS
  Filled 2018-03-12: qty 250

## 2018-03-12 MED ORDER — ASPIRIN 81 MG PO CHEW
324.0000 mg | CHEWABLE_TABLET | Freq: Once | ORAL | Status: AC
  Administered 2018-03-12: 324 mg via ORAL
  Filled 2018-03-12: qty 4

## 2018-03-12 MED ORDER — SODIUM CHLORIDE 0.9% FLUSH: 3.0000 mL | INTRAVENOUS | Status: DC | PRN

## 2018-03-12 MED ORDER — SODIUM CHLORIDE 0.9 % IV SOLN: 250.0000 mL | INTRAVENOUS | Status: DC | PRN

## 2018-03-12 MED ORDER — LIDOCAINE HCL (PF) 1 % IJ SOLN
INTRAMUSCULAR | Status: AC
  Filled 2018-03-12: qty 30

## 2018-03-12 MED ORDER — ATORVASTATIN CALCIUM 10 MG PO TABS
10.0000 mg | ORAL_TABLET | Freq: Every day | ORAL | Status: DC
  Administered 2018-03-12 – 2018-03-13 (×2): 10 mg via ORAL
  Filled 2018-03-12 (×2): qty 1

## 2018-03-12 MED ORDER — HEPARIN BOLUS VIA INFUSION
4000.0000 [IU] | Freq: Once | INTRAVENOUS | Status: AC
  Administered 2018-03-12: 4000 [IU] via INTRAVENOUS

## 2018-03-12 MED ORDER — SODIUM CHLORIDE 0.9 % IV SOLN: INTRAVENOUS | Status: AC

## 2018-03-12 MED ORDER — NITROGLYCERIN 0.4 MG SL SUBL: 0.4000 mg | SUBLINGUAL_TABLET | SUBLINGUAL | Status: DC | PRN

## 2018-03-12 MED ORDER — SODIUM CHLORIDE 0.9% FLUSH
3.0000 mL | Freq: Two times a day (BID) | INTRAVENOUS | Status: DC
  Administered 2018-03-12: 22:00:00 3 mL via INTRAVENOUS

## 2018-03-12 MED ORDER — LOSARTAN POTASSIUM 50 MG PO TABS: 100.0000 mg | ORAL_TABLET | Freq: Every day | ORAL | Status: DC

## 2018-03-12 MED ORDER — HEPARIN SODIUM (PORCINE) 1000 UNIT/ML IJ SOLN
INTRAMUSCULAR | Status: AC
  Filled 2018-03-12: qty 1

## 2018-03-12 MED ORDER — HEPARIN SODIUM (PORCINE) 1000 UNIT/ML IJ SOLN
INTRAMUSCULAR | Status: DC | PRN
  Administered 2018-03-12: 5000 [IU] via INTRAVENOUS

## 2018-03-12 MED ORDER — ACETAMINOPHEN 325 MG PO TABS: 650.0000 mg | ORAL_TABLET | ORAL | Status: DC | PRN

## 2018-03-12 MED ORDER — HEPARIN (PORCINE) IN NACL 1000-0.9 UT/500ML-% IV SOLN
INTRAVENOUS | Status: AC
  Filled 2018-03-12: qty 1000

## 2018-03-12 MED ORDER — VERAPAMIL HCL 2.5 MG/ML IV SOLN
INTRAVENOUS | Status: AC
  Filled 2018-03-12: qty 2

## 2018-03-12 MED ORDER — SODIUM CHLORIDE 0.9% FLUSH: 3.0000 mL | Freq: Two times a day (BID) | INTRAVENOUS | Status: DC

## 2018-03-12 MED ORDER — LEVOTHYROXINE SODIUM 75 MCG PO TABS
175.0000 ug | ORAL_TABLET | Freq: Every day | ORAL | Status: DC
  Administered 2018-03-13: 175 ug via ORAL
  Filled 2018-03-12: qty 1

## 2018-03-12 MED ORDER — MIDAZOLAM HCL 2 MG/2ML IJ SOLN
INTRAMUSCULAR | Status: AC
  Filled 2018-03-12: qty 2

## 2018-03-12 MED ORDER — PANTOPRAZOLE SODIUM 40 MG PO TBEC
40.0000 mg | DELAYED_RELEASE_TABLET | Freq: Every day | ORAL | Status: DC
  Administered 2018-03-13: 10:00:00 40 mg via ORAL
  Filled 2018-03-12: qty 1

## 2018-03-12 MED ORDER — MIDAZOLAM HCL 2 MG/2ML IJ SOLN
INTRAMUSCULAR | Status: DC | PRN
  Administered 2018-03-12: 1 mg via INTRAVENOUS
  Administered 2018-03-12: 2 mg via INTRAVENOUS

## 2018-03-12 MED ORDER — ASPIRIN EC 81 MG PO TBEC: 81.0000 mg | DELAYED_RELEASE_TABLET | Freq: Every day | ORAL | Status: DC

## 2018-03-12 MED ORDER — HEPARIN (PORCINE) IN NACL 2-0.9 UNITS/ML
INTRAMUSCULAR | Status: AC | PRN
  Administered 2018-03-12 (×2): 500 mL

## 2018-03-12 MED ORDER — IOHEXOL 350 MG/ML SOLN
INTRAVENOUS | Status: DC | PRN
  Administered 2018-03-12: 80 mL

## 2018-03-12 MED ORDER — LIDOCAINE HCL (PF) 1 % IJ SOLN
INTRAMUSCULAR | Status: DC | PRN
  Administered 2018-03-12: 2 mL

## 2018-03-12 MED ORDER — BUPROPION HCL ER (SR) 150 MG PO TB12
150.0000 mg | ORAL_TABLET | Freq: Two times a day (BID) | ORAL | Status: DC
Start: 2018-03-12 — End: 2018-03-13
  Administered 2018-03-12 – 2018-03-13 (×2): 150 mg via ORAL
  Filled 2018-03-12 (×2): qty 1

## 2018-03-12 MED ORDER — FENTANYL CITRATE (PF) 100 MCG/2ML IJ SOLN
INTRAMUSCULAR | Status: DC | PRN
  Administered 2018-03-12 (×2): 25 ug via INTRAVENOUS

## 2018-03-12 MED ORDER — ONDANSETRON HCL 4 MG/2ML IJ SOLN: 4.0000 mg | Freq: Four times a day (QID) | INTRAMUSCULAR | Status: DC | PRN

## 2018-03-12 MED ORDER — FENTANYL CITRATE (PF) 100 MCG/2ML IJ SOLN
INTRAMUSCULAR | Status: AC
  Filled 2018-03-12: qty 2

## 2018-03-12 MED ORDER — HEPARIN (PORCINE) IN NACL 2-0.9 UNIT/ML-% IJ SOLN
INTRAMUSCULAR | Status: DC | PRN
  Administered 2018-03-12: 10 mL via INTRA_ARTERIAL

## 2018-03-12 SURGICAL SUPPLY — 12 items
BAND CMPR LRG ZPHR (HEMOSTASIS) ×1
BAND ZEPHYR COMPRESS 30 LONG (HEMOSTASIS) ×1 IMPLANT
CATH INFINITI 5FR MULTPACK ANG (CATHETERS) ×1 IMPLANT
GUIDEWIRE INQWIRE 1.5J.035X260 (WIRE) IMPLANT
INQWIRE 1.5J .035X260CM (WIRE) ×2
KIT HEART LEFT (KITS) ×2 IMPLANT
NDL PERC 21GX4CM (NEEDLE) IMPLANT
NEEDLE PERC 21GX4CM (NEEDLE) ×2 IMPLANT
PACK CARDIAC CATHETERIZATION (CUSTOM PROCEDURE TRAY) ×2 IMPLANT
SHEATH RAIN RADIAL 21G 6FR (SHEATH) ×1 IMPLANT
TRANSDUCER W/STOPCOCK (MISCELLANEOUS) ×2 IMPLANT
TUBING CIL FLEX 10 FLL-RA (TUBING) ×2 IMPLANT

## 2018-03-12 NOTE — Progress Notes (Signed)
ANTICOAGULATION CONSULT NOTE - Initial Consult  Pharmacy Consult for heparin Indication: ACS/STEMI  Allergies  Allergen Reactions  . Lyrica [Pregabalin] Shortness Of Breath  . Celecoxib     Other reaction(s): fatigue ? GI BLEED  . Codeine Itching    Patient Measurements: Height: 6' (182.9 cm) Weight: 225 lb (102.1 kg) IBW/kg (Calculated) : 77.6 Heparin Dosing Weight: 98.5 kg  Vital Signs: Temp: 98 F (36.7 C) (04/23 0948) Temp Source: Oral (04/23 0948) BP: 147/86 (04/23 1230) Pulse Rate: 65 (04/23 1230)  Labs: Recent Labs    03/12/18 1035  HGB 15.0  HCT 45.7  PLT 182  CREATININE 1.18  TROPONINI <0.03    Estimated Creatinine Clearance: 78.2 mL/min (by C-G formula based on SCr of 1.18 mg/dL).   Medical History: Past Medical History:  Diagnosis Date  . Arthritis   . Asthma    Childhood  . Carotid artery occlusion   . COPD (chronic obstructive pulmonary disease) ([place])   . Essential hypertension   . Goiter    Radioactive iodine treatment  . H pylori ulcer Jan 2016   Treated with Prevpac  . Headache    migraines - Topamax  . History of bronchitis   . History of kidney stones   . History of pneumonia   . Hypercholesterolemia   . Hypertension   . Hypothyroid   . Neuropathy   . Shortness of breath dyspnea    with exertion  . Wears glasses     Medications:   (Not in a hospital admission)  Assessment: Pharmacy consulted to dose heparin for patient with ACS/STEMI. Patient has no listed home anticoagulants.  Goal of Therapy:  Heparin level 0.3-0.7 units/ml Monitor platelets by anticoagulation protocol: Yes   Plan:  Give 4000 units bolus x 1 Start heparin infusion at 1200 units/hr Check anti-Xa level in 6 hours and daily while on heparin Continue to monitor H&H and platelets  Margot Ables, PharmD Clinical Pharmacist 03/12/2018 1:36 PM

## 2018-03-12 NOTE — ED Notes (Signed)
Pt returned from X-ray.  

## 2018-03-12 NOTE — ED Notes (Signed)
Patient transported to X-ray 

## 2018-03-12 NOTE — Interval H&P Note (Signed)
History and Physical Interval Note:  03/12/2018 4:24 PM  Seth Savage  has presented today for cardiac cath with the diagnosis of unstable angina. The various methods of treatment have been discussed with the patient and family. After consideration of risks, benefits and other options for treatment, the patient has consented to  Procedure(s): LEFT HEART CATH AND CORONARY ANGIOGRAPHY (N/A) as a surgical intervention .  The patient's history has been reviewed, patient examined, no change in status, stable for surgery.  I have reviewed the patient's chart and labs.  Questions were answered to the patient's satisfaction.    Cath Lab Visit (complete for each Cath Lab visit)  Clinical Evaluation Leading to the Procedure:   ACS: No.  Non-ACS:    Anginal Classification: CCS III  Anti-ischemic medical therapy: No Therapy  Non-Invasive Test Results: No non-invasive testing performed  Prior CABG: No previous CABG         Lauree Chandler

## 2018-03-12 NOTE — ED Provider Notes (Signed)
[facility] EMERGENCY DEPARTMENT Provider Note   CSN: [id number] Arrival date & time: 03/12/18  4166     History   Chief Complaint Chief Complaint  Patient presents with  . Chest Pain    HPI Seth Savage is a 67 y.o. male with a history of CAD, carotid artery occlusion with surgical intervention, COPD, hypertension, hypercholesterolemia with a history of cardiac catheterization August 2017 showing multiple occlusions, greatest measuring 30% with no need for intervention other than medical management presenting with a one-week history of intermittent chest pain, shortness of breath and numbness in his upper extremities with exertion.  However he does report the first episode occurring upon just awaking 1 week ago, but since has had episodes lasting up to 15 minutes with heavy lifting or other exertional activities.  Symptoms resolve at rest.  He describes a midsternal tightness sensation in association with shortness of breath, diaphoresis and bilateral arm tingling.  His last episode occurred about 8 this morning while carrying a car battery.  He is currently asymptomatic.  He takes aspirin 81 mg last dose yesterday evening.  He denies heart palpitations, dizziness, peripheral edema, increased cough.  He does endorse generalized weakness.  He also has a history of GERD but is on Protonix and states his symptoms are not similar to acid reflux.  He also reports a history of gallstones in the past, still has his gallbladder and has chronic right upper quadrant discomfort.     The history is provided by the patient and the spouse.    Past Medical History:  Diagnosis Date  . Arthritis   . Asthma    Childhood  . Carotid artery occlusion   . COPD (chronic obstructive pulmonary disease) ([place])   . Essential hypertension   . Goiter    Radioactive iodine treatment  . H pylori ulcer Jan 2016   Treated with Prevpac  . Headache    migraines - Topamax  . History of bronchitis   . History  of kidney stones   . History of pneumonia   . Hypercholesterolemia   . Hypertension   . Hypothyroid   . Neuropathy   . Shortness of breath dyspnea    with exertion  . Wears glasses     Patient Active Problem List   Diagnosis Date Noted  . History of colonic polyps 03/23/2017  . RUQ pain 02/07/2017  . Asymptomatic carotid artery stenosis 07/07/2016  . Abnormal stress test   . Coronary artery disease involving native coronary artery of native heart without angina pectoris   . Tobacco use disorder 03/23/2015  . Essential hypertension 03/23/2015  . Chest pain 03/22/2015  . History of peptic ulcer disease   . PUD (peptic ulcer disease) 02/25/2015  . Acute gastric ulcer   . Reflux esophagitis   . Headache(784.0) 09/03/2013  . Occlusion and stenosis of carotid artery without mention of cerebral infarction 12/12/2012  . LUQ pain 09/23/2012  . Hematochezia 09/23/2012    Past Surgical History:  Procedure Laterality Date  . BACK SURGERY  2003  . BIOPSY  05/01/2017   Procedure: BIOPSY;  Surgeon: Daneil Dolin, MD;  [facility];  Service: Endoscopy;;  gastric  . CARDIAC CATHETERIZATION N/A 07/05/2016   Procedure: Left Heart Cath and Coronary Angiography;  Surgeon: Burnell Blanks, MD;  Location: [place] CV LAB;  Service: Cardiovascular;  Laterality: N/A;  . CAROTID ENDARTERECTOMY Left   . COLECTOMY  2000   Secondary to intussuception  . COLONOSCOPY  08/24/2004   Normal rectum,Small polyp at 25 cm, cold snared/ The remainder of the colonic mucosa appeared normal  . COLONOSCOPY  09/24/2012   Dr. Mike Craze hemorrhoids-likely source of hematochezia.Colonic diverticulosis  . COLONOSCOPY N/A 05/01/2017   Dr. Gala Romney: Diverticulosis, next colonoscopy in 5 years.  Marland Kitchen ENDARTERECTOMY Right 07/07/2016   Procedure: ENDARTERECTOMY CAROTID;  Surgeon: Rosetta Posner, MD;  Location: [facility] OR;  Service: Vascular;  Laterality: Right;  . ESOPHAGOGASTRODUODENOSCOPY N/A 12/09/2014    Dr. Gala Romney: Erosive reflux esophagitis. Peptic Ulcer disease secondary to H.pylori. small hiatal hernia. Nonbleeding duodenal AVM. Duodenal erosions. TREATED WITH PREVPAC  . ESOPHAGOGASTRODUODENOSCOPY N/A 03/15/2015   Dr. Gala Romney: small hiatal hernia, healed PUD, duodenal AVM.   Marland Kitchen ESOPHAGOGASTRODUODENOSCOPY N/A 05/01/2017   Dr. Gala Romney: LA grade a esophagitis, gastritis, no H pylori  . Multiple bilateral arm/wrist surgeries after falls    . PATCH ANGIOPLASTY Right 07/07/2016   Procedure: PATCH ANGIOPLASTY USING 0.8CM X 7.6CM HEMASHIELD PATCH;  Surgeon: Rosetta Posner, MD;  Location: [place];  Service: Vascular;  Laterality: Right;  . ROTATOR CUFF REPAIR Left 1992   Metal implant   . ROTATOR CUFF REPAIR Right 1999  . SHOULDER SURGERY    . SPINE SURGERY          Home Medications    Prior to Admission medications   Medication Sig Start Date End Date Taking? Authorizing Provider  aspirin EC 81 MG tablet Take 81 mg by mouth at bedtime.    Yes [provider]  atorvastatin (LIPITOR) 10 MG tablet Take 1 tablet (10 mg total) by mouth daily. Patient taking differently: Take 10 mg by mouth at bedtime.  07/07/16  Yes Alvia Grove, PA-C  buPROPion (WELLBUTRIN SR) 150 MG 12 hr tablet Take 150 mg by mouth 2 (two) times daily.  07/30/17  Yes [provider]  fluticasone (FLONASE) 50 MCG/ACT nasal spray Place 1 spray into both nostrils as needed for allergies.    Yes [provider]  hydrochlorothiazide (HYDRODIURIL) 12.5 MG tablet Take 1 tablet by mouth at bedtime.  06/30/16  Yes [provider]  levothyroxine (SYNTHROID, LEVOTHROID) 175 MCG tablet Take 175 mcg by mouth daily before breakfast.   Yes [provider]  losartan (COZAAR) 100 MG tablet Take 100 mg by mouth at bedtime.  08/28/12  Yes [provider]  pantoprazole (PROTONIX) 40 MG tablet Take 1 tablet (40 mg total) by mouth daily. 09/06/15  Yes Mahala Menghini, PA-C  dicyclomine (BENTYL) 20 MG  tablet Take 1 tablet (20 mg total) by mouth 2 (two) times daily. 06/07/16 06/07/16  Comer Locket, PA-C    Family History Family History  Problem Relation Age of Onset  . COPD Mother   . Mental illness Mother   . Hypertension Mother   . Varicose Veins Mother   . Heart attack Mother 40  . Hypertension Father   . Alcohol abuse Father   . Colon cancer Neg Hx     Social History Social History   Tobacco Use  . Smoking status: Current Some Day Smoker    Packs/day: 0.20    Years: 45.00    Pack years: 9.00    Types: Cigarettes    Last attempt to quit: 09/25/2015    Years since quitting: 2.4  . Smokeless tobacco: Never Used  . Tobacco comment: started back  Substance Use Topics  . Alcohol use: Yes    Alcohol/week: 0.0 oz    Comment: 2 beers per month, rarely   .  Drug use: No     Allergies   Lyrica [pregabalin]; Celecoxib; and Codeine   Review of Systems Review of Systems  Constitutional: Positive for diaphoresis. Negative for fever.  HENT: Negative for congestion and sore throat.   Eyes: Negative.   Respiratory: Positive for chest tightness and shortness of breath.   Cardiovascular: Negative for chest pain.  Gastrointestinal: Positive for abdominal pain. Negative for nausea and vomiting.  Genitourinary: Negative.   Musculoskeletal: Negative for arthralgias, joint swelling and neck pain.  Skin: Negative.  Negative for rash and wound.  Neurological: Negative for dizziness, weakness, light-headedness, numbness and headaches.  Psychiatric/Behavioral: Negative.      Physical Exam Updated Vital Signs BP (!) 146/87   Pulse 74   Temp 98 F (36.7 C) (Oral)   Resp 14   Ht 6' (1.829 m)   Wt 102.1 kg (225 lb)   SpO2 100%   BMI 30.52 kg/m   Physical Exam  Constitutional: He appears well-developed and well-nourished.  HENT:  Head: Normocephalic and atraumatic.  Eyes: Conjunctivae are normal.  Neck: Normal range of motion.  Cardiovascular: Normal rate, regular  rhythm, normal heart sounds and intact distal pulses.  Pulses:      Carotid pulses are 2+ on the right side, and 2+ on the left side.      Radial pulses are 2+ on the right side, and 2+ on the left side.  Pulmonary/Chest: Effort normal and breath sounds normal. He has no wheezes.  Abdominal: Soft. Bowel sounds are normal. There is no tenderness.  Musculoskeletal: Normal range of motion.       Right lower leg: He exhibits no edema.       Left lower leg: He exhibits no edema.  Neurological: He is alert.  Skin: Skin is warm and dry.  Psychiatric: He has a normal mood and affect.  Nursing note and vitals reviewed.    ED Treatments / Results  Labs (all labs ordered are listed, but only abnormal results are displayed) Labs Reviewed  BASIC METABOLIC PANEL - Abnormal; Notable for the following components:      Result Value   Glucose, Bld 124 (*)    All other components within normal limits  CBC  TROPONIN I  HEPATIC FUNCTION PANEL    EKG None  Radiology Dg Chest 2 View  Result Date: 03/12/2018 CLINICAL DATA:  Intermittent mid chest pain for the past 5 days. Occasional tingling sensation in the arms. History of peripheral vascular disease. EXAM: CHEST - 2 VIEW COMPARISON:  Chest x-ray of June 07, 2016 FINDINGS: The lungs are well-expanded. There is no focal infiltrate. There is no pleural effusion. There is stable pleural thickening along the lower left lateral thoracic wall. There is stable biapical pleural thickening. The heart and pulmonary vascularity are normal. The bony thorax exhibits no acute abnormality. IMPRESSION: There is no pneumonia, CHF, nor other acute cardiopulmonary abnormality. There is probable chronic bronchitic-smoking related change bilaterally which is stable. Electronically Signed   By: David  Martinique M.D.   On: 03/12/2018 10:25    Procedures Procedures (including critical care time)  Medications Ordered in ED Medications  aspirin chewable tablet 324 mg (324  mg Oral Given 03/12/18 1048)     Initial Impression / Assessment and Plan / ED Course  I have reviewed the triage vital signs and the nursing notes.  Pertinent labs & imaging results that were available during my care of the patient were reviewed by me and considered in my medical decision  making (see chart for details).     Pt's labs, imaging and ekg reviewed.  On monitor he had a few episodes of short runs of ST elevation, asymptomatic, then return to baseline.  Sx and history concerning for probable unstable angina.  Initial troponin negative. He has been sx free here.  Spoke with Dr. Roderic Palau who agrees with need for admission for cardiac rule out.    Call placed to [facility] for admission.  Discussed case with Dr. Dyann Kief who recommends cards consult prior to admitting here as pt may be better served at [facility].    12:48 PM spoke with Dr. Harl Bowie who will consult pt in ed.  2:37 PM Pt seen by Dr. Harl Bowie who will be transferring pt directly to cath lab at [facility]. Transfer order placed.  Final Clinical Impressions(s) / ED Diagnoses   Final diagnoses:  Unstable angina [facility])    ED Discharge Orders    None       Landis Martins 03/12/18 1438    Milton Ferguson, MD 03/12/18 1521

## 2018-03-12 NOTE — Consult Note (Signed)
Cardiology Consultation:   Patient ID: Seth Savage; [id number]; [birth year]   Admit date: 03/12/2018 Date of Consult: 03/12/2018  Primary Care Provider: Glenda Chroman, MD Primary Cardiologist: Seth Savage    Patient Profile:   Seth Savage is a 67 y.o. male with a hx of carotid stenosis s/p bilateral CEAs, HTN,HL  who is being seen today for the evaluation of chest pain at the request of Dr Seth Savage.  History of Present Illness:   Seth Savage 67 yo male history of carotid stenosis with prior right CEA, prior left CEA, HTN, HL, GERD, COPD presents with chest pain Chart review shows multiple presentations. Nuclear stress in 2016 without clear ischemia. Nuclear stress 2017 with abnormal EKG, possible inferior infarct with very mild ischemia. F/u cath showed mild nonobstructive disease.   Presents with 1 week of chest pain. 8/10 buring like feeling midchest and epigastric with +SOB, diaphoresis, dizziness. Primarily comes on with exertion. Most recent episode yesterday while carrying batteries at work, severe pain had to stop and rest until resolved. Reports symptoms have increased in both frequency and severity since onset 1 week ago. Different and more severe from prior chest pain.    K 4.1 Cr 1.18 WBC 7 Hgb 15 Plt 182 Trop neg x 1 EKG SR, no ischemic changes CXR no acute process 06/2016 nuclear stress: inferior infarct with very mild peri-infarct ischemia, st depressions with stress 06/2016 cath: mild nonobstructive disease  Past Medical History:  Diagnosis Date  . Arthritis   . Asthma    Childhood  . Carotid artery occlusion   . COPD (chronic obstructive pulmonary disease) (Seth Savage)   . Essential hypertension   . Goiter    Radioactive iodine treatment  . H pylori ulcer Jan 2016   Treated with Prevpac  . Headache    migraines - Topamax  . History of bronchitis   . History of kidney stones   . History of pneumonia   . Hypercholesterolemia   . Hypertension   . Hypothyroid   .  Neuropathy   . Shortness of breath dyspnea    with exertion  . Wears glasses     Past Surgical History:  Procedure Laterality Date  . BACK SURGERY  2003  . BIOPSY  05/01/2017   Procedure: BIOPSY;  Surgeon: Seth Dolin, MD;  [facility];  Service: Endoscopy;;  gastric  . CARDIAC CATHETERIZATION N/A 07/05/2016   Procedure: Left Heart Cath and Coronary Angiography;  Surgeon: Seth Blanks, MD;  Location: [place] CV LAB;  Service: Cardiovascular;  Laterality: N/A;  . CAROTID ENDARTERECTOMY Left   . COLECTOMY  2000   Secondary to intussuception  . COLONOSCOPY  08/24/2004   Normal rectum,Small polyp at 25 cm, cold snared/ The remainder of the colonic mucosa appeared normal  . COLONOSCOPY  09/24/2012   Dr. Mike Savage hemorrhoids-likely source of hematochezia.Colonic diverticulosis  . COLONOSCOPY N/A 05/01/2017   Dr. Gala Savage: Diverticulosis, next colonoscopy in 5 years.  Marland Kitchen ENDARTERECTOMY Right 07/07/2016   Procedure: ENDARTERECTOMY CAROTID;  Surgeon: Seth Posner, MD;  Location: [facility] OR;  Service: Vascular;  Laterality: Right;  . ESOPHAGOGASTRODUODENOSCOPY N/A 12/09/2014   Dr. Gala Savage: Erosive reflux esophagitis. Peptic Ulcer disease secondary to H.pylori. small hiatal hernia. Nonbleeding duodenal AVM. Duodenal erosions. TREATED WITH PREVPAC  . ESOPHAGOGASTRODUODENOSCOPY N/A 03/15/2015   Dr. Gala Savage: small hiatal hernia, healed PUD, duodenal AVM.   Marland Kitchen ESOPHAGOGASTRODUODENOSCOPY N/A 05/01/2017   Dr. Gala Savage: LA grade a esophagitis, gastritis, no H pylori  . Multiple  bilateral arm/wrist surgeries after falls    . PATCH ANGIOPLASTY Right 07/07/2016   Procedure: PATCH ANGIOPLASTY USING 0.8CM X 7.6CM HEMASHIELD PATCH;  Surgeon: Seth Posner, MD;  Location: [place];  Service: Vascular;  Laterality: Right;  . ROTATOR CUFF REPAIR Left 1992   Metal implant   . ROTATOR CUFF REPAIR Right 1999  . SHOULDER SURGERY    . SPINE SURGERY        Inpatient Medications: Scheduled  Meds:  Continuous Infusions:  PRN Meds:   Allergies:    Allergies  Allergen Reactions  . Lyrica [Pregabalin] Shortness Of Breath  . Celecoxib     Other reaction(s): fatigue ? GI BLEED  . Codeine Itching    Social History:   Social History   Socioeconomic History  . Marital status: Married    Spouse name: Seth Savage  . Number of children: 0  . Years of education: Ged  . Highest education level: Not on file  Occupational History    Comment: Disabled  Social Needs  . Financial resource strain: Not on file  . Food insecurity:    Worry: Not on file    Inability: Not on file  . Transportation needs:    Medical: Not on file    Non-medical: Not on file  Tobacco Use  . Smoking status: Current Some Day Smoker    Packs/day: 0.20    Years: 45.00    Pack years: 9.00    Types: Cigarettes    Last attempt to quit: 09/25/2015    Years since quitting: 2.4  . Smokeless tobacco: Never Used  . Tobacco comment: started back  Substance and Sexual Activity  . Alcohol use: Yes    Alcohol/week: 0.0 oz    Comment: 2 beers per month, rarely   . Drug use: No  . Sexual activity: Yes  Lifestyle  . Physical activity:    Days per week: Not on file    Minutes per session: Not on file  . Stress: Not on file  Relationships  . Social connections:    Talks on phone: Not on file    Gets together: Not on file    Attends religious service: Not on file    Active member of club or organization: Not on file    Attends meetings of clubs or organizations: Not on file    Relationship status: Not on file  . Intimate partner violence:    Fear of current or ex partner: Not on file    Emotionally abused: Not on file    Physically abused: Not on file    Forced sexual activity: Not on file  Other Topics Concern  . Not on file  Social History Narrative   Patient lives at home with his wife. [facility]).    Disabled.   Right handed.   Caffeine- two cups daily.   Three step children.    Family  History:    Family History  Problem Relation Age of Onset  . COPD Mother   . Mental illness Mother   . Hypertension Mother   . Varicose Veins Mother   . Heart attack Mother 78  . Hypertension Father   . Alcohol abuse Father   . Colon cancer Neg Hx      ROS:  Please see the history of present illness.  All other ROS reviewed and negative.     Physical Exam/Data:   Vitals:   03/12/18 1044 03/12/18 1100 03/12/18 1200 03/12/18 1230  BP: (!) 146/87 128/76  Marland Kitchen)  147/86  Pulse: 74 71 65 65  Resp: 14 19 (!) 23 18  Temp:      TempSrc:      SpO2: 100% 100% 100% 100%  Weight:      Height:       No intake or output data in the 24 hours ending 03/12/18 1251 Filed Weights   03/12/18 0949  Weight: 225 lb (102.1 kg)   Body mass index is 30.52 kg/m.  General:  Well nourished, well developed, in no acute distress HEENT: normal Lymph: no adenopathy Neck: no JVD Endocrine:  No thryomegaly Cardiac:  normal S1, S2; RRR; no murmur  Lungs:  clear to auscultation bilaterally, no wheezing, rhonchi or rales  Abd: soft, nontender, no hepatomegaly  Ext: no edema Musculoskeletal:  No deformities, BUE and BLE strength normal and equal Skin: warm and dry  Neuro:  CNs 2-12 intact, no focal abnormalities noted Psych:  Normal affect    Relevant CV Studies: 06/2016 cath  Prox RCA lesion, 10 %stenosed.  Ost RPDA lesion, 20 %stenosed.  Dist RCA lesion, 20 %stenosed.  1st Mrg lesion, 30 %stenosed.  Prox LAD to Mid LAD lesion, 20 %stenosed.  There is no mitral valve regurgitation.  The left ventricular systolic function is normal.  LV end diastolic pressure is normal.  The left ventricular ejection fraction is 50-55% by visual estimate.   1. Mild non-obstructive CAD 2. Low normal LV systolic function  Recommendations: Medical management of mild CAD with ASA, statin.  Laboratory Data:  Chemistry Recent Labs  Lab 03/12/18 1035  NA 138  K 4.1  CL 101  CO2 24  GLUCOSE 124*   BUN 17  CREATININE 1.18  CALCIUM 9.4  GFRNONAA >60  GFRAA >60  ANIONGAP 13    Recent Labs  Lab 03/12/18 1035  PROT 7.3  ALBUMIN 4.1  AST 17  ALT 25  ALKPHOS 105  BILITOT 0.8   Hematology Recent Labs  Lab 03/12/18 1035  WBC 7.0  RBC 5.15  HGB 15.0  HCT 45.7  MCV 88.7  MCH 29.1  MCHC 32.8  RDW 12.9  PLT 182   Cardiac Enzymes Recent Labs  Lab 03/12/18 1035  TROPONINI <0.03   No results for input(s): TROPIPOC in the last 168 hours.  BNPNo results for input(s): BNP, PROBNP in the last 168 hours.  DDimer No results for input(s): DDIMER in the last 168 hours.  Radiology/Studies:  Dg Chest 2 View  Result Date: 03/12/2018 CLINICAL DATA:  Intermittent mid chest pain for the past 5 days. Occasional tingling sensation in the arms. History of peripheral vascular disease. EXAM: CHEST - 2 VIEW COMPARISON:  Chest x-ray of June 07, 2016 FINDINGS: The lungs are well-expanded. There is no focal infiltrate. There is no pleural effusion. There is stable pleural thickening along the lower left lateral thoracic wall. There is stable biapical pleural thickening. The heart and pulmonary vascularity are normal. The bony thorax exhibits no acute abnormality. IMPRESSION: There is no pneumonia, CHF, nor other acute cardiopulmonary abnormality. There is probable chronic bronchitic-smoking related change bilaterally which is stable. Electronically Signed   By: David  Martinique M.D.   On: 03/12/2018 10:25    Assessment and Plan:   1. Chest pain/unstable angina - symptoms worrisome for unstable angina. Central chest pain primarily with exertion associated with SOB, progressing in severity and frequency since onset. Patient with multiple CAD risk factors including carotid stenosis s/p bilateral CEAs, mild CAD noted from cath in 2017 - EKG and enzymes  early on are benign.   - start hep gtt. Continue aspirin, statin but change to higher dose, ARB. Start low dose lopressor 12.5mg  bid.  - plan for  [facility], patient has been npo since yesterday. If cannot add to schedule today then transfer today to cardiology service and plan for cath tomorrow   I have reviewed the risks, indications, and alternatives to cardiac catheterization, possible angioplasty, and stenting with the patient and his wife today. Risks include but are not limited to bleeding, infection, vascular injury, stroke, myocardial infection, arrhythmia, kidney injury, radiation-related injury in the case of prolonged fluoroscopy use, emergency cardiac surgery, and death. The patient understands the risks of serious complication is 1-2 in 9163 with diagnostic cardiac cath and 1-2% or less with angioplasty/stenting.    For questions or updates, please contact [facility] Please consult [email or web address] for contact info under Cardiology/STEMI.   Merrily Pew, MD  03/12/2018 12:51 PM

## 2018-03-12 NOTE — H&P (View-Only) (Signed)
Cardiology Consultation:   Patient ID: Seth Savage; [id number]; 1951-11-11   Admit date: 03/12/2018 Date of Consult: 03/12/2018  Primary Care Provider: Glenda Chroman, MD Primary Cardiologist: Bronson Ing    Patient Profile:   Seth Savage is a 67 y.o. male with a hx of carotid stenosis s/p bilateral CEAs, HTN,HL  who is being seen today for the evaluation of chest pain at the request of Dr Roderic Palau.  History of Present Illness:   Seth Savage 67 yo male history of carotid stenosis with prior right CEA, prior left CEA, HTN, HL, GERD, COPD presents with chest pain Chart review shows multiple presentations. Nuclear stress in 2016 without clear ischemia. Nuclear stress 2017 with abnormal EKG, possible inferior infarct with very mild ischemia. F/u cath showed mild nonobstructive disease.   Presents with 1 week of chest pain. 8/10 buring like feeling midchest and epigastric with +SOB, diaphoresis, dizziness. Primarily comes on with exertion. Most recent episode yesterday while carrying batteries at work, severe pain had to stop and rest until resolved. Reports symptoms have increased in both frequency and severity since onset 1 week ago. Different and more severe from prior chest pain.    K 4.1 Cr 1.18 WBC 7 Hgb 15 Plt 182 Trop neg x 1 EKG SR, no ischemic changes CXR no acute process 06/2016 nuclear stress: inferior infarct with very mild peri-infarct ischemia, st depressions with stress 06/2016 cath: mild nonobstructive disease  Past Medical History:  Diagnosis Date  . Arthritis   . Asthma    Childhood  . Carotid artery occlusion   . COPD (chronic obstructive pulmonary disease) ([place])   . Essential hypertension   . Goiter    Radioactive iodine treatment  . H pylori ulcer Jan 2016   Treated with Prevpac  . Headache    migraines - Topamax  . History of bronchitis   . History of kidney stones   . History of pneumonia   . Hypercholesterolemia   . Hypertension   . Hypothyroid   .  Neuropathy   . Shortness of breath dyspnea    with exertion  . Wears glasses     Past Surgical History:  Procedure Laterality Date  . BACK SURGERY  2003  . BIOPSY  05/01/2017   Procedure: BIOPSY;  Surgeon: Daneil Dolin, MD;  Location: [facility];  Service: Endoscopy;;  gastric  . CARDIAC CATHETERIZATION N/A 07/05/2016   Procedure: Left Heart Cath and Coronary Angiography;  Surgeon: Burnell Blanks, MD;  Location: [facility];  Service: Cardiovascular;  Laterality: N/A;  . CAROTID ENDARTERECTOMY Left   . COLECTOMY  2000   Secondary to intussuception  . COLONOSCOPY  08/24/2004   Normal rectum,Small polyp at 25 cm, cold snared/ The remainder of the colonic mucosa appeared normal  . COLONOSCOPY  09/24/2012   Dr. Mike Craze hemorrhoids-likely source of hematochezia.Colonic diverticulosis  . COLONOSCOPY N/A 05/01/2017   Dr. Gala Romney: Diverticulosis, next colonoscopy in 5 years.  Marland Kitchen ENDARTERECTOMY Right 07/07/2016   Procedure: ENDARTERECTOMY CAROTID;  Surgeon: Rosetta Posner, MD;  Location: [facility] OR;  Service: Vascular;  Laterality: Right;  . ESOPHAGOGASTRODUODENOSCOPY N/A 12/09/2014   Dr. Gala Romney: Erosive reflux esophagitis. Peptic Ulcer disease secondary to H.pylori. small hiatal hernia. Nonbleeding duodenal AVM. Duodenal erosions. TREATED WITH PREVPAC  . ESOPHAGOGASTRODUODENOSCOPY N/A 03/15/2015   Dr. Gala Romney: small hiatal hernia, healed PUD, duodenal AVM.   Marland Kitchen ESOPHAGOGASTRODUODENOSCOPY N/A 05/01/2017   Dr. Gala Romney: LA grade a esophagitis, gastritis, no H pylori  . Multiple  bilateral arm/wrist surgeries after falls    . PATCH ANGIOPLASTY Right 07/07/2016   Procedure: PATCH ANGIOPLASTY USING 0.8CM X 7.6CM HEMASHIELD PATCH;  Surgeon: Rosetta Posner, MD;  Location: [facility];  Service: Vascular;  Laterality: Right;  . ROTATOR CUFF REPAIR Left 1992   Metal implant   . ROTATOR CUFF REPAIR Right 1999  . SHOULDER SURGERY    . SPINE SURGERY        Inpatient Medications: Scheduled  Meds:  Continuous Infusions:  PRN Meds:   Allergies:    Allergies  Allergen Reactions  . Lyrica [Pregabalin] Shortness Of Breath  . Celecoxib     Other reaction(s): fatigue ? GI BLEED  . Codeine Itching    Social History:   Social History   Socioeconomic History  . Marital status: Married    Spouse name: [name]  . Number of children: 0  . Years of education: Ged  . Highest education level: Not on file  Occupational History    Comment: Disabled  Social Needs  . Financial resource strain: Not on file  . Food insecurity:    Worry: Not on file    Inability: Not on file  . Transportation needs:    Medical: Not on file    Non-medical: Not on file  Tobacco Use  . Smoking status: Current Some Day Smoker    Packs/day: 0.20    Years: 45.00    Pack years: 9.00    Types: Cigarettes    Last attempt to quit: 09/25/2015    Years since quitting: 2.4  . Smokeless tobacco: Never Used  . Tobacco comment: started back  Substance and Sexual Activity  . Alcohol use: Yes    Alcohol/week: 0.0 oz    Comment: 2 beers per month, rarely   . Drug use: No  . Sexual activity: Yes  Lifestyle  . Physical activity:    Days per week: Not on file    Minutes per session: Not on file  . Stress: Not on file  Relationships  . Social connections:    Talks on phone: Not on file    Gets together: Not on file    Attends religious service: Not on file    Active member of club or organization: Not on file    Attends meetings of clubs or organizations: Not on file    Relationship status: Not on file  . Intimate partner violence:    Fear of current or ex partner: Not on file    Emotionally abused: Not on file    Physically abused: Not on file    Forced sexual activity: Not on file  Other Topics Concern  . Not on file  Social History Narrative   Patient lives at home with his wife. [facility]).    Disabled.   Right handed.   Caffeine- two cups daily.   Three step children.    Family  History:    Family History  Problem Relation Age of Onset  . COPD Mother   . Mental illness Mother   . Hypertension Mother   . Varicose Veins Mother   . Heart attack Mother 73  . Hypertension Father   . Alcohol abuse Father   . Colon cancer Neg Hx      ROS:  Please see the history of present illness.  All other ROS reviewed and negative.     Physical Exam/Data:   Vitals:   03/12/18 1044 03/12/18 1100 03/12/18 1200 03/12/18 1230  BP: (!) 146/87 128/76  Marland Kitchen)  147/86  Pulse: 74 71 65 65  Resp: 14 19 (!) 23 18  Temp:      TempSrc:      SpO2: 100% 100% 100% 100%  Weight:      Height:       No intake or output data in the 24 hours ending 03/12/18 1251 Filed Weights   03/12/18 0949  Weight: 225 lb (102.1 kg)   Body mass index is 30.52 kg/m.  General:  Well nourished, well developed, in no acute distress HEENT: normal Lymph: no adenopathy Neck: no JVD Endocrine:  No thryomegaly Cardiac:  normal S1, S2; RRR; no murmur  Lungs:  clear to auscultation bilaterally, no wheezing, rhonchi or rales  Abd: soft, nontender, no hepatomegaly  Ext: no edema Musculoskeletal:  No deformities, BUE and BLE strength normal and equal Skin: warm and dry  Neuro:  CNs 2-12 intact, no focal abnormalities noted Psych:  Normal affect    Relevant CV Studies: 06/2016 cath  Prox RCA lesion, 10 %stenosed.  Ost RPDA lesion, 20 %stenosed.  Dist RCA lesion, 20 %stenosed.  1st Mrg lesion, 30 %stenosed.  Prox LAD to Mid LAD lesion, 20 %stenosed.  There is no mitral valve regurgitation.  The left ventricular systolic function is normal.  LV end diastolic pressure is normal.  The left ventricular ejection fraction is 50-55% by visual estimate.   1. Mild non-obstructive CAD 2. Low normal LV systolic function  Recommendations: Medical management of mild CAD with ASA, statin.  Laboratory Data:  Chemistry Recent Labs  Lab 03/12/18 1035  NA 138  K 4.1  CL 101  CO2 24  GLUCOSE 124*   BUN 17  CREATININE 1.18  CALCIUM 9.4  GFRNONAA >60  GFRAA >60  ANIONGAP 13    Recent Labs  Lab 03/12/18 1035  PROT 7.3  ALBUMIN 4.1  AST 17  ALT 25  ALKPHOS 105  BILITOT 0.8   Hematology Recent Labs  Lab 03/12/18 1035  WBC 7.0  RBC 5.15  HGB 15.0  HCT 45.7  MCV 88.7  MCH 29.1  MCHC 32.8  RDW 12.9  PLT 182   Cardiac Enzymes Recent Labs  Lab 03/12/18 1035  TROPONINI <0.03   No results for input(s): TROPIPOC in the last 168 hours.  BNPNo results for input(s): BNP, PROBNP in the last 168 hours.  DDimer No results for input(s): DDIMER in the last 168 hours.  Radiology/Studies:  Dg Chest 2 View  Result Date: 03/12/2018 CLINICAL DATA:  Intermittent mid chest pain for the past 5 days. Occasional tingling sensation in the arms. History of peripheral vascular disease. EXAM: CHEST - 2 VIEW COMPARISON:  Chest x-ray of June 07, 2016 FINDINGS: The lungs are well-expanded. There is no focal infiltrate. There is no pleural effusion. There is stable pleural thickening along the lower left lateral thoracic wall. There is stable biapical pleural thickening. The heart and pulmonary vascularity are normal. The bony thorax exhibits no acute abnormality. IMPRESSION: There is no pneumonia, CHF, nor other acute cardiopulmonary abnormality. There is probable chronic bronchitic-smoking related change bilaterally which is stable. Electronically Signed   By: David  Martinique M.D.   On: 03/12/2018 10:25    Assessment and Plan:   1. Chest pain/unstable angina - symptoms worrisome for unstable angina. Central chest pain primarily with exertion associated with SOB, progressing in severity and frequency since onset. Patient with multiple CAD risk factors including carotid stenosis s/p bilateral CEAs, mild CAD noted from cath in 2017 - EKG and enzymes  early on are benign.   - start hep gtt. Continue aspirin, statin but change to higher dose, ARB. Start low dose lopressor 12.5mg  bid.  - plan for  [facility], patient has been npo since yesterday. If cannot add to schedule today then transfer today to cardiology service and plan for cath tomorrow   I have reviewed the risks, indications, and alternatives to cardiac catheterization, possible angioplasty, and stenting with the patient and his wife today. Risks include but are not limited to bleeding, infection, vascular injury, stroke, myocardial infection, arrhythmia, kidney injury, radiation-related injury in the case of prolonged fluoroscopy use, emergency cardiac surgery, and death. The patient understands the risks of serious complication is 1-2 in 3299 with diagnostic cardiac cath and 1-2% or less with angioplasty/stenting.    For questions or updates, please contact [place] Please consult [email or web address] for contact info under Cardiology/STEMI.   Merrily Pew, MD  03/12/2018 12:51 PM

## 2018-03-12 NOTE — ED Triage Notes (Signed)
Pt c/o intermittent epigastric pain and SOB x 5 days. States when pain comes, his arms get weak, hands and face become numb, and he gets diaphoretic. NAD noted.

## 2018-03-13 ENCOUNTER — Encounter (HOSPITAL_COMMUNITY): Payer: Self-pay | Admitting: Cardiovascular Disease

## 2018-03-13 DIAGNOSIS — I200 Unstable angina: Secondary | ICD-10-CM | POA: Diagnosis not present

## 2018-03-13 DIAGNOSIS — R0789 Other chest pain: Secondary | ICD-10-CM | POA: Diagnosis not present

## 2018-03-13 DIAGNOSIS — R072 Precordial pain: Secondary | ICD-10-CM | POA: Diagnosis not present

## 2018-03-13 LAB — CBC
HCT: 41.9 % (ref 39.0–52.0)
Hemoglobin: 14.1 g/dL (ref 13.0–17.0)
MCH: 29.9 pg (ref 26.0–34.0)
MCHC: 33.7 g/dL (ref 30.0–36.0)
MCV: 88.8 fL (ref 78.0–100.0)
Platelets: 173 10*3/uL (ref 150–400)
RBC: 4.72 MIL/uL (ref 4.22–5.81)
RDW: 12.9 % (ref 11.5–15.5)
WBC: 6.3 10*3/uL (ref 4.0–10.5)

## 2018-03-13 LAB — LIPID PANEL
Cholesterol: 119 mg/dL (ref 0–200)
HDL: 29 mg/dL — AB (ref >40)
LDL CALC: 47 mg/dL (ref 0–99)
Total CHOL/HDL Ratio: 4.1 RATIO
Triglycerides: 213 mg/dL — ABNORMAL HIGH (ref <150)
VLDL: 43 mg/dL — ABNORMAL HIGH (ref 0–40)

## 2018-03-13 LAB — BASIC METABOLIC PANEL
Anion gap: 9 (ref 5–15)
BUN: 15 mg/dL (ref 6–20)
CALCIUM: 8.7 mg/dL — AB (ref 8.9–10.3)
CO2: 27 mmol/L (ref 22–32)
Chloride: 101 mmol/L (ref 101–111)
Creatinine, Ser: 1.2 mg/dL (ref 0.61–1.24)
GFR calc Af Amer: >60 mL/min (ref >60)
GFR calc non Af Amer: >60 mL/min (ref >60)
GLUCOSE: 111 mg/dL — AB (ref 65–99)
Potassium: 4 mmol/L (ref 3.5–5.1)
Sodium: 137 mmol/L (ref 135–145)

## 2018-03-13 LAB — HIV ANTIBODY (ROUTINE TESTING W REFLEX): HIV SCREEN 4TH GENERATION: NONREACTIVE

## 2018-03-13 MED FILL — Heparin Sod (Porcine)-NaCl IV Soln 1000 Unit/500ML-0.9%: INTRAVENOUS | Qty: 1000 | Status: AC

## 2018-03-13 NOTE — Progress Notes (Signed)
[facility] BAND REMOVAL  LOCATION:    left radial  DEFLATED PER PROTOCOL:    Yes.    TIME BAND OFF / DRESSING APPLIED:    20:00   SITE UPON ARRIVAL:    Level 0  SITE AFTER BAND REMOVAL:    Level 0  CIRCULATION SENSATION AND MOVEMENT:    Within Normal Limits   Yes.    COMMENTS:   Post TR band instructions given. Pt tolerated well.

## 2018-03-13 NOTE — Discharge Summary (Signed)
Discharge Summary    Patient ID: Seth Savage,  MRN: [id number], DOB/AGE: [birth year] 67 y.o.  Admit date: 03/12/2018 Discharge date: 03/13/2018  Primary Care Provider: Glenda Chroman Primary Cardiologist: Bronson Ing  Discharge Diagnoses    Active Problems:   Chest pain   Unstable angina (HCC)   Allergies Allergies  Allergen Reactions  . Lyrica [Pregabalin] Shortness Of Breath  . Celecoxib     Other reaction(s): fatigue ? GI BLEED  . Codeine Itching    Diagnostic Studies/Procedures    Cath: 03/12/18  Conclusion     Prox RCA lesion is 30% stenosed.  Post Atrio lesion is 40% stenosed.  Ost RPDA lesion is 20% stenosed.  Ost 1st Mrg lesion is 30% stenosed.  Ost 2nd Mrg lesion is 30% stenosed.  Ost Cx to Prox Cx lesion is 20% stenosed.  Ost LAD to Prox LAD lesion is 30% stenosed.  The left ventricular systolic function is normal.  LV end diastolic pressure is normal.  The left ventricular ejection fraction is 55-65% by visual estimate.  There is no mitral valve regurgitation.   1. Mild non-obstructive CAD 2. Normal LV systolic function  Recommendations: consider addition of long acting nitrate. Consider non-cardiac causes of chest pain. Continue medical management of mild CAD  _____________   History of Present Illness     Seth Savage 67 yo male history of carotid stenosis with prior right CEA, prior left CEA, HTN, HL, GERD, COPD presents with chest pain. Chart review showed multiple presentations. Nuclear stress in 2016 without clear ischemia. Nuclear stress 2017 with abnormal EKG, possible inferior infarct with very mild ischemia. F/u cath showed mild nonobstructive disease.   Presented with 1 week of chest pain. 8/10 buring like feeling midchest and epigastric with +SOB, diaphoresis, dizziness. Primarily comes on with exertion. Most recent episode the day prior to admission while carrying batteries at work, severe pain had to stop and rest until  resolved. Reports symptoms have increased in both frequency and severity since onset 1 week ago. Different and more severe from prior chest pain. Given his symptoms he was transferred to [facility] for a cath.   Hospital Course     Underwent cath noted above with nonobstructive CAD noted. Plan to continue to with risk factor modification. He was continued on his home medications without change. Does note history of issues with his gallbladder. Suggest follow up with GI if symptoms persist. Morning labs were stable. No chest pain overnight.   General: Well developed, well nourished, male appearing in no acute distress. Head: Normocephalic, atraumatic.  Neck: Supple without bruits, JVD. Lungs:  Resp regular and unlabored, CTA. Heart: RRR, S1, S2, no S3, S4, or murmur; no rub. Abdomen: Soft, non-tender, non-distended with normoactive bowel sounds. No hepatomegaly. No rebound/guarding. No obvious abdominal masses. Extremities: No clubbing, cyanosis, edema. Distal pedal pulses are 2+ bilaterally. L radial cath site stable without bruising or hematoma Neuro: Alert and oriented X 3. Moves all extremities spontaneously. Psych: Normal affect.  Seth Savage was seen by Dr. Martinique and determined stable for discharge home. Follow up in the office has been arranged. Medications are listed below.   _____________  Discharge Vitals Blood pressure 131/71, pulse 66, temperature 98 F (36.7 C), temperature source Oral, resp. rate 14, height 6' (1.829 m), weight 225 lb (102.1 kg), SpO2 97 %.  Filed Weights   03/12/18 0949 03/12/18 1555 03/13/18 0631  Weight: 225 lb (102.1 kg) 225 lb (102.1 kg) 225 lb (102.1  kg)    Labs & Radiologic Studies    CBC Recent Labs    03/12/18 1035 03/13/18 0256  WBC 7.0 6.3  HGB 15.0 14.1  HCT 45.7 41.9  MCV 88.7 88.8  PLT 182 254   Basic Metabolic Panel Recent Labs    03/12/18 1035 03/13/18 0256  NA 138 137  K 4.1 4.0  CL 101 101  CO2 24 27  GLUCOSE 124* 111*    BUN 17 15  CREATININE 1.18 1.20  CALCIUM 9.4 8.7*   Liver Function Tests Recent Labs    03/12/18 1035  AST 17  ALT 25  ALKPHOS 105  BILITOT 0.8  PROT 7.3  ALBUMIN 4.1   No results for input(s): LIPASE, AMYLASE in the last 72 hours. Cardiac Enzymes Recent Labs    03/12/18 1035  TROPONINI <0.03   BNP Invalid input(s): POCBNP D-Dimer No results for input(s): DDIMER in the last 72 hours. Hemoglobin A1C No results for input(s): HGBA1C in the last 72 hours. Fasting Lipid Panel Recent Labs    03/13/18 0256  CHOL 119  HDL 29*  LDLCALC 47  TRIG 213*  CHOLHDL 4.1   Thyroid Function Tests No results for input(s): TSH, T4TOTAL, T3FREE, THYROIDAB in the last 72 hours.  Invalid input(s): FREET3 _____________  Dg Chest 2 View  Result Date: 03/12/2018 CLINICAL DATA:  Intermittent mid chest pain for the past 5 days. Occasional tingling sensation in the arms. History of peripheral vascular disease. EXAM: CHEST - 2 VIEW COMPARISON:  Chest x-ray of June 07, 2016 FINDINGS: The lungs are well-expanded. There is no focal infiltrate. There is no pleural effusion. There is stable pleural thickening along the lower left lateral thoracic wall. There is stable biapical pleural thickening. The heart and pulmonary vascularity are normal. The bony thorax exhibits no acute abnormality. IMPRESSION: There is no pneumonia, CHF, nor other acute cardiopulmonary abnormality. There is probable chronic bronchitic-smoking related change bilaterally which is stable. Electronically Signed   By: David  Martinique M.D.   On: 03/12/2018 10:25   Disposition   Pt is being discharged home today in good condition.  Follow-up Plans & Appointments    Follow-up Information    Herminio Commons, MD Follow up.   Specialty:  Cardiology Why:  The office will call you with a follow up appt.  Contact information: [place] [phone]          Discharge Instructions    Diet - low  sodium heart healthy   Complete by:  As directed    Discharge instructions   Complete by:  As directed    Radial Site Care Refer to this sheet in the next few weeks. These instructions provide you with information on caring for yourself after your procedure. Your caregiver may also give you more specific instructions. Your treatment has been planned according to current medical practices, but problems sometimes occur. Call your caregiver if you have any problems or questions after your procedure. HOME CARE INSTRUCTIONS You may shower the day after the procedure.Remove the bandage (dressing) and gently wash the site with plain soap and water.Gently pat the site dry.  Do not apply powder or lotion to the site.  Do not submerge the affected site in water for 3 to 5 days.  Inspect the site at least twice daily.  Do not flex or bend the affected arm for 24 hours.  No lifting over 5 pounds (2.3 kg) for 5 days after your procedure.  Do not drive home if you are discharged the same day of the procedure. Have someone else drive you.  You may drive 24 hours after the procedure unless otherwise instructed by your caregiver.  What to expect: Any bruising will usually fade within 1 to 2 weeks.  Blood that collects in the tissue (hematoma) may be painful to the touch. It should usually decrease in size and tenderness within 1 to 2 weeks.  SEEK IMMEDIATE MEDICAL CARE IF: You have unusual pain at the radial site.  You have redness, warmth, swelling, or pain at the radial site.  You have drainage (other than a small amount of blood on the dressing).  You have chills.  You have a fever or persistent symptoms for more than 72 hours.  You have a fever and your symptoms suddenly get worse.  Your arm becomes pale, cool, tingly, or numb.  You have heavy bleeding from the site. Hold pressure on the site.   Increase activity slowly   Complete by:  As directed       Discharge Medications     Medication  List    TAKE these medications   aspirin EC 81 MG tablet Take 81 mg by mouth at bedtime.   atorvastatin 10 MG tablet Commonly known as:  LIPITOR Take 1 tablet (10 mg total) by mouth daily. What changed:  when to take this   buPROPion 150 MG 12 hr tablet Commonly known as:  WELLBUTRIN SR Take 150 mg by mouth 2 (two) times daily.   fluticasone 50 MCG/ACT nasal spray Commonly known as:  FLONASE Place 1 spray into both nostrils as needed for allergies.   hydrochlorothiazide 12.5 MG tablet Commonly known as:  HYDRODIURIL Take 1 tablet by mouth at bedtime.   levothyroxine 175 MCG tablet Commonly known as:  SYNTHROID, LEVOTHROID Take 175 mcg by mouth daily before breakfast.   losartan 100 MG tablet Commonly known as:  COZAAR Take 100 mg by mouth at bedtime.   pantoprazole 40 MG tablet Commonly known as:  PROTONIX Take 1 tablet (40 mg total) by mouth daily.         Outstanding Labs/Studies   N/a   Duration of Discharge Encounter   Greater than 30 minutes including physician time.  Signed, Reino Bellis NP-C 03/13/2018, 9:06 AM

## 2018-04-16 ENCOUNTER — Ambulatory Visit: Payer: Medicare Other | Admitting: General Surgery

## 2018-04-16 ENCOUNTER — Encounter: Payer: Self-pay | Admitting: General Surgery

## 2018-04-16 VITALS — BP 157/90 | HR 75 | Temp 97.5°F | Resp 18 | Wt 222.0 lb

## 2018-04-16 DIAGNOSIS — K8020 Calculus of gallbladder without cholecystitis without obstruction: Secondary | ICD-10-CM

## 2018-04-16 NOTE — Progress Notes (Signed)
Seth Savage; [id number]; 1951-05-28   HPI Patient is a 67 year old white male who was referred to my care by Dr. Woody Seller for evaluation and treatment of cholelithiasis.  Patient recently had an episode of epigastric and right upper quadrant abdominal pain.  He underwent cardiac catheterization and this was ruled out a cardiac etiology.  Ultrasound revealed biliary sludge.  Common bile duct is within normal limits.  Patient states he has a known history of cholelithiasis with pancreatitis in the past.  Recently, he is developed more epigastric discomfort and right upper quadrant abdominal pain radiating around his right flank.  He does feel bloated and does have some fatty food intolerance.  No fever, chills, jaundice have been noted.  He currently has no significant abdominal pain. Past Medical History:  Diagnosis Date  . Arthritis    "wrists, thumb on left hand" (03/12/2018)  . Back injury 2003   "broke my back in 2 places; never had any OR for it" (03/12/2018)  . Carotid artery occlusion   . Childhood asthma   . COPD (chronic obstructive pulmonary disease) ([place])   . Essential hypertension   . GERD (gastroesophageal reflux disease)   . Goiter    Radioactive iodine treatment  . H pylori ulcer Jan 2016   Treated with Prevpac  . History of bronchitis   . History of kidney stones   . Hypercholesterolemia   . Hypertension   . Hypothyroid   . Migraine    "been about 2 yr w/out a headache" (03/12/2018)  . Neuropathy   . Pneumonia 1969  . Shortness of breath dyspnea    with exertion  . Wears glasses     Past Surgical History:  Procedure Laterality Date  . BIOPSY  05/01/2017   Procedure: BIOPSY;  Surgeon: Daneil Dolin, MD;  [facility];  Service: Endoscopy;;  gastric  . CARDIAC CATHETERIZATION N/A 07/05/2016   Procedure: Left Heart Cath and Coronary Angiography;  Surgeon: Burnell Blanks, MD;  Location: [facility];  Service: Cardiovascular;  Laterality: N/A;  .  CARDIAC CATHETERIZATION  03/12/2018  . CAROTID ENDARTERECTOMY Left ?2009  . CARPAL TUNNEL RELEASE Right   . COLECTOMY  2000   Secondary to intussuception  . COLONOSCOPY  08/24/2004   Normal rectum,Small polyp at 25 cm, cold snared/ The remainder of the colonic mucosa appeared normal  . COLONOSCOPY  09/24/2012   Dr. Mike Craze hemorrhoids-likely source of hematochezia.Colonic diverticulosis  . COLONOSCOPY N/A 05/01/2017   Dr. Gala Romney: Diverticulosis, next colonoscopy in 5 years.  Marland Kitchen ENDARTERECTOMY Right 07/07/2016   Procedure: ENDARTERECTOMY CAROTID;  Surgeon: Rosetta Posner, MD;  Location: [facility] OR;  Service: Vascular;  Laterality: Right;  . ESOPHAGOGASTRODUODENOSCOPY N/A 12/09/2014   Dr. Gala Romney: Erosive reflux esophagitis. Peptic Ulcer disease secondary to H.pylori. small hiatal hernia. Nonbleeding duodenal AVM. Duodenal erosions. TREATED WITH PREVPAC  . ESOPHAGOGASTRODUODENOSCOPY N/A 03/15/2015   Dr. Gala Romney: small hiatal hernia, healed PUD, duodenal AVM.   Marland Kitchen ESOPHAGOGASTRODUODENOSCOPY N/A 05/01/2017   Dr. Gala Romney: LA grade a esophagitis, gastritis, no H pylori  . FRACTURE SURGERY    . LEFT HEART CATH AND CORONARY ANGIOGRAPHY N/A 03/12/2018   Procedure: LEFT HEART CATH AND CORONARY ANGIOGRAPHY;  Surgeon: Burnell Blanks, MD;  Location: [facility];  Service: Cardiovascular;  Laterality: N/A;  . Multiple bilateral arm/wrist surgeries after falls    . ORIF SHOULDER FRACTURE Left 06/2002   "shattered; put a bunch of metal plates in it"  . PATCH ANGIOPLASTY Right  07/07/2016   Procedure: PATCH ANGIOPLASTY USING 0.8CM X 7.6CM HEMASHIELD PATCH;  Surgeon: Rosetta Posner, MD;  Location: [facility];  Service: Vascular;  Laterality: Right;  . SHOULDER ARTHROSCOPY W/ ROTATOR CUFF REPAIR Right 1999  . SHOULDER OPEN ROTATOR CUFF REPAIR Left 1992   Metal implant     Family History  Problem Relation Age of Onset  . COPD Mother   . Mental illness Mother   . Hypertension Mother   . Varicose Veins Mother    . Heart attack Mother 47  . Hypertension Father   . Alcohol abuse Father   . Colon cancer Neg Hx     Current Outpatient Medications on File Prior to Visit  Medication Sig Dispense Refill  . aspirin EC 81 MG tablet Take 81 mg by mouth at bedtime.     Marland Kitchen atorvastatin (LIPITOR) 10 MG tablet Take 1 tablet (10 mg total) by mouth daily. (Patient taking differently: Take 10 mg by mouth at bedtime. ) 30 tablet 11  . buPROPion (WELLBUTRIN SR) 150 MG 12 hr tablet Take 150 mg by mouth 2 (two) times daily.     . fluticasone (FLONASE) 50 MCG/ACT nasal spray Place 1 spray into both nostrils as needed for allergies.     . hydrochlorothiazide (HYDRODIURIL) 12.5 MG tablet Take 1 tablet by mouth at bedtime.     Marland Kitchen levothyroxine (SYNTHROID, LEVOTHROID) 175 MCG tablet Take 175 mcg by mouth daily before breakfast.    . losartan (COZAAR) 100 MG tablet Take 100 mg by mouth at bedtime.     . pantoprazole (PROTONIX) 40 MG tablet Take 1 tablet (40 mg total) by mouth daily. 30 tablet 11  . [DISCONTINUED] dicyclomine (BENTYL) 20 MG tablet Take 1 tablet (20 mg total) by mouth 2 (two) times daily. 20 tablet 0   No current facility-administered medications on file prior to visit.     Allergies  Allergen Reactions  . Lyrica [Pregabalin] Shortness Of Breath  . Celecoxib     Other reaction(s): fatigue ? GI BLEED    Social History   Substance and Sexual Activity  Alcohol Use Yes  . Alcohol/week: 0.0 oz   Comment: 03/12/2018 "nothing in 6 months; might have a couple beers in the summer"    Social History   Tobacco Use  Smoking Status Former Smoker  . Packs/day: 0.20  . Years: 45.00  . Pack years: 9.00  . Types: Cigarettes  . Last attempt to quit: 12/21/2017  . Years since quitting: 0.3  Smokeless Tobacco Never Used    Review of Systems  Constitutional: Positive for malaise/fatigue.  HENT: Positive for sinus pain.   Eyes: Positive for pain.  Respiratory: Positive for shortness of breath.    Cardiovascular: Positive for chest pain.  Gastrointestinal: Positive for abdominal pain, heartburn and nausea.  Genitourinary: Negative.   Musculoskeletal: Positive for back pain, joint pain and neck pain.  Skin: Negative.   Neurological: Positive for tremors.  Endo/Heme/Allergies: Negative.   Psychiatric/Behavioral: Negative.     Objective   Vitals:   04/16/18 1305  BP: (!) 157/90  Pulse: 75  Resp: 18  Temp: (!) 97.5 F (36.4 C)    Physical Exam  Constitutional: He is oriented to person, place, and time. He appears well-developed and well-nourished.  HENT:  Head: Normocephalic and atraumatic.  Eyes: No scleral icterus.  Cardiovascular: Normal rate, regular rhythm and normal heart sounds. Exam reveals no gallop.  No murmur heard. Pulmonary/Chest: Effort normal and breath sounds normal. No stridor.  No respiratory distress. He has no wheezes. He has no rhonchi. He has no rales.  Abdominal: Soft. Normal appearance and bowel sounds are normal. There is no hepatosplenomegaly. There is no rigidity, no guarding and negative Murphy's sign.  Neurological: He is alert and oriented to person, place, and time.  Skin: Skin is warm and dry.  Vitals reviewed. Dr. Woody Seller and Cardiology notes reviewed.  Assessment  Cholelithiasis, biliary colic Plan   Patient is scheduled for laparoscopic cholecystectomy, possible open due to his previous abdominal surgery on 04/24/2018.  The risks and benefits of the procedure including bleeding, infection, hepatobiliary injury, bowel injury, and the possibility of an open procedure were fully explained to the patient, who gave informed consent.

## 2018-04-16 NOTE — H&P (Signed)
Seth Savage; [id number]; 03-10-1951   HPI Patient is a 67 year old white male who was referred to my care by Dr. Woody Seller for evaluation and treatment of cholelithiasis.  Patient recently had an episode of epigastric and right upper quadrant abdominal pain.  He underwent cardiac catheterization and this was ruled out a cardiac etiology.  Ultrasound revealed biliary sludge.  Common bile duct is within normal limits.  Patient states he has a known history of cholelithiasis with pancreatitis in the past.  Recently, he is developed more epigastric discomfort and right upper quadrant abdominal pain radiating around his right flank.  He does feel bloated and does have some fatty food intolerance.  No fever, chills, jaundice have been noted.  He currently has no significant abdominal pain. Past Medical History:  Diagnosis Date  . Arthritis    "wrists, thumb on left hand" (03/12/2018)  . Back injury 2003   "broke my back in 2 places; never had any OR for it" (03/12/2018)  . Carotid artery occlusion   . Childhood asthma   . COPD (chronic obstructive pulmonary disease) ([place])   . Essential hypertension   . GERD (gastroesophageal reflux disease)   . Goiter    Radioactive iodine treatment  . H pylori ulcer Jan 2016   Treated with Prevpac  . History of bronchitis   . History of kidney stones   . Hypercholesterolemia   . Hypertension   . Hypothyroid   . Migraine    "been about 2 yr w/out a headache" (03/12/2018)  . Neuropathy   . Pneumonia 1969  . Shortness of breath dyspnea    with exertion  . Wears glasses     Past Surgical History:  Procedure Laterality Date  . BIOPSY  05/01/2017   Procedure: BIOPSY;  Surgeon: Daneil Dolin, MD;  [facility];  Service: Endoscopy;;  gastric  . CARDIAC CATHETERIZATION N/A 07/05/2016   Procedure: Left Heart Cath and Coronary Angiography;  Surgeon: Burnell Blanks, MD;  Location: [facility];  Service: Cardiovascular;  Laterality: N/A;  .  CARDIAC CATHETERIZATION  03/12/2018  . CAROTID ENDARTERECTOMY Left ?2009  . CARPAL TUNNEL RELEASE Right   . COLECTOMY  2000   Secondary to intussuception  . COLONOSCOPY  08/24/2004   Normal rectum,Small polyp at 25 cm, cold snared/ The remainder of the colonic mucosa appeared normal  . COLONOSCOPY  09/24/2012   Dr. Mike Craze hemorrhoids-likely source of hematochezia.Colonic diverticulosis  . COLONOSCOPY N/A 05/01/2017   Dr. Gala Romney: Diverticulosis, next colonoscopy in 5 years.  Marland Kitchen ENDARTERECTOMY Right 07/07/2016   Procedure: ENDARTERECTOMY CAROTID;  Surgeon: Rosetta Posner, MD;  Location: [facility] OR;  Service: Vascular;  Laterality: Right;  . ESOPHAGOGASTRODUODENOSCOPY N/A 12/09/2014   Dr. Gala Romney: Erosive reflux esophagitis. Peptic Ulcer disease secondary to H.pylori. small hiatal hernia. Nonbleeding duodenal AVM. Duodenal erosions. TREATED WITH PREVPAC  . ESOPHAGOGASTRODUODENOSCOPY N/A 03/15/2015   Dr. Gala Romney: small hiatal hernia, healed PUD, duodenal AVM.   Marland Kitchen ESOPHAGOGASTRODUODENOSCOPY N/A 05/01/2017   Dr. Gala Romney: LA grade a esophagitis, gastritis, no H pylori  . FRACTURE SURGERY    . LEFT HEART CATH AND CORONARY ANGIOGRAPHY N/A 03/12/2018   Procedure: LEFT HEART CATH AND CORONARY ANGIOGRAPHY;  Surgeon: Burnell Blanks, MD;  Location: [facility];  Service: Cardiovascular;  Laterality: N/A;  . Multiple bilateral arm/wrist surgeries after falls    . ORIF SHOULDER FRACTURE Left 06/2002   "shattered; put a bunch of metal plates in it"  . PATCH ANGIOPLASTY Right  07/07/2016   Procedure: PATCH ANGIOPLASTY USING 0.8CM X 7.6CM HEMASHIELD PATCH;  Surgeon: Rosetta Posner, MD;  Location: [facility];  Service: Vascular;  Laterality: Right;  . SHOULDER ARTHROSCOPY W/ ROTATOR CUFF REPAIR Right 1999  . SHOULDER OPEN ROTATOR CUFF REPAIR Left 1992   Metal implant     Family History  Problem Relation Age of Onset  . COPD Mother   . Mental illness Mother   . Hypertension Mother   . Varicose Veins Mother    . Heart attack Mother 7  . Hypertension Father   . Alcohol abuse Father   . Colon cancer Neg Hx     Current Outpatient Medications on File Prior to Visit  Medication Sig Dispense Refill  . aspirin EC 81 MG tablet Take 81 mg by mouth at bedtime.     Marland Kitchen atorvastatin (LIPITOR) 10 MG tablet Take 1 tablet (10 mg total) by mouth daily. (Patient taking differently: Take 10 mg by mouth at bedtime. ) 30 tablet 11  . buPROPion (WELLBUTRIN SR) 150 MG 12 hr tablet Take 150 mg by mouth 2 (two) times daily.     . fluticasone (FLONASE) 50 MCG/ACT nasal spray Place 1 spray into both nostrils as needed for allergies.     . hydrochlorothiazide (HYDRODIURIL) 12.5 MG tablet Take 1 tablet by mouth at bedtime.     Marland Kitchen levothyroxine (SYNTHROID, LEVOTHROID) 175 MCG tablet Take 175 mcg by mouth daily before breakfast.    . losartan (COZAAR) 100 MG tablet Take 100 mg by mouth at bedtime.     . pantoprazole (PROTONIX) 40 MG tablet Take 1 tablet (40 mg total) by mouth daily. 30 tablet 11  . [DISCONTINUED] dicyclomine (BENTYL) 20 MG tablet Take 1 tablet (20 mg total) by mouth 2 (two) times daily. 20 tablet 0   No current facility-administered medications on file prior to visit.     Allergies  Allergen Reactions  . Lyrica [Pregabalin] Shortness Of Breath  . Celecoxib     Other reaction(s): fatigue ? GI BLEED    Social History   Substance and Sexual Activity  Alcohol Use Yes  . Alcohol/week: 0.0 oz   Comment: 03/12/2018 "nothing in 6 months; might have a couple beers in the summer"    Social History   Tobacco Use  Smoking Status Former Smoker  . Packs/day: 0.20  . Years: 45.00  . Pack years: 9.00  . Types: Cigarettes  . Last attempt to quit: 12/21/2017  . Years since quitting: 0.3  Smokeless Tobacco Never Used    Review of Systems  Constitutional: Positive for malaise/fatigue.  HENT: Positive for sinus pain.   Eyes: Positive for pain.  Respiratory: Positive for shortness of breath.    Cardiovascular: Positive for chest pain.  Gastrointestinal: Positive for abdominal pain, heartburn and nausea.  Genitourinary: Negative.   Musculoskeletal: Positive for back pain, joint pain and neck pain.  Skin: Negative.   Neurological: Positive for tremors.  Endo/Heme/Allergies: Negative.   Psychiatric/Behavioral: Negative.     Objective   Vitals:   04/16/18 1305  BP: (!) 157/90  Pulse: 75  Resp: 18  Temp: (!) 97.5 F (36.4 C)    Physical Exam  Constitutional: He is oriented to person, place, and time. He appears well-developed and well-nourished.  HENT:  Head: Normocephalic and atraumatic.  Eyes: No scleral icterus.  Cardiovascular: Normal rate, regular rhythm and normal heart sounds. Exam reveals no gallop.  No murmur heard. Pulmonary/Chest: Effort normal and breath sounds normal. No stridor.  No respiratory distress. He has no wheezes. He has no rhonchi. He has no rales.  Abdominal: Soft. Normal appearance and bowel sounds are normal. There is no hepatosplenomegaly. There is no rigidity, no guarding and negative Murphy's sign.  Neurological: He is alert and oriented to person, place, and time.  Skin: Skin is warm and dry.  Vitals reviewed. Dr. Woody Seller and Cardiology notes reviewed.  Assessment  Cholelithiasis, biliary colic Plan   Patient is scheduled for laparoscopic cholecystectomy, possible open due to his previous abdominal surgery on 04/24/2018.  The risks and benefits of the procedure including bleeding, infection, hepatobiliary injury, bowel injury, and the possibility of an open procedure were fully explained to the patient, who gave informed consent.

## 2018-04-16 NOTE — Patient Instructions (Signed)
Open Cholecystectomy Open cholecystectomy is surgery to remove the gallbladder. The gallbladder is a pear-shaped organ that lies beneath the liver on the right side of the body. The gallbladder stores bile, which is a fluid that helps the body to digest fats. Cholecystectomy is often done for inflammation of the gallbladder (cholecystitis). This condition is usually caused by a buildup of gallstones (cholelithiasis) in the gallbladder. Gallstones can block the flow of bile, which can result in inflammation and pain. In severe cases, emergency surgery may be required. Tell a health care provider about:  Any allergies you have.  All medicines you are taking, including vitamins, herbs, eye drops, creams, and over-the-counter medicines.  Any problems you or family members have had with anesthetic medicines.  Any blood disorders you have.  Any surgeries you have had.  Any medical conditions you have.  Whether you are pregnant or may be pregnant. What are the risks? Generally, this is a safe procedure. However, problems may occur, including:  Infection.  Bleeding.  Allergic reactions to medicines.  Damage to other structures or organs.  A stone remaining in the common bile duct. The common bile duct carries bile from the gallbladder into the small intestine.  A bile leak from the cyst duct that is clipped when your gallbladder is removed.  What happens before the procedure? Staying hydrated Follow instructions from your health care provider about hydration, which may include:  Up to 2 hours before the procedure - you may continue to drink clear liquids, such as water, clear fruit juice, black coffee, and plain tea.  Eating and drinking restrictions Follow instructions from your health care provider about eating and drinking, which may include:  8 hours before the procedure - stop eating heavy meals or foods such as meat, fried foods, or fatty foods.  6 hours before the procedure  - stop eating light meals or foods, such as toast or cereal.  6 hours before the procedure - stop drinking milk or drinks that contain milk.  2 hours before the procedure - stop drinking clear liquids.  Medicines  Ask your health care provider about: ? Changing or stopping your regular medicines. This is especially important if you are taking diabetes medicines or blood thinners. ? Taking medicines such as aspirin and ibuprofen. These medicines can thin your blood. Do not take these medicines before your procedure if your health care provider instructs you not to.  You may be given antibiotic medicine to help prevent infection. General instructions  Let your health care provider know if you develop a cold or an infection before surgery.  Plan to have someone take you home from the hospital or clinic.  Ask your health care provider how your surgical site will be marked or identified. What happens during the procedure?  To reduce your risk of infection: ? Your health care team will wash or sanitize their hands. ? Your skin will be washed with soap. ? Hair may be removed from the surgical area.  An IV tube may be inserted into one of your veins.  You will be given one or more of the following: ? A medicine to help you relax (sedative). ? A medicine to make you fall asleep (general anesthetic).  A breathing tube will be placed in your mouth.  Your surgeon will make a cut (incision) in the upper abdomen to access your gallbladder.  Your gallbladder will be removed.  Your common bile duct may be examined. If stones are found in the  common bile duct, they may be removed.  After your gallbladder has been removed, the incisions will be closed with stitches (sutures), skin glue, or staples.  Your incision will be covered with a bandage (dressing). The procedure may vary among health care providers and hospitals. What happens after the procedure?  Your blood pressure, heart rate,  breathing rate, and blood oxygen level will be monitored until the medicines you were given have worn off.  You will be given medicines as needed to control your pain.  Do not drive for 24 hours if you were given a sedative. This information is not intended to replace advice given to you by your health care provider. Make sure you discuss any questions you have with your health care provider. Document Released: 07/29/2002 Document Revised: 05/27/2016 Document Reviewed: 04/24/2016 Elsevier Interactive Patient Education  2018 [employer]. Laparoscopic Cholecystectomy Laparoscopic cholecystectomy is surgery to remove the gallbladder. The gallbladder is a pear-shaped organ that lies beneath the liver on the right side of the body. The gallbladder stores bile, which is a fluid that helps the body to digest fats. Cholecystectomy is often done for inflammation of the gallbladder (cholecystitis). This condition is usually caused by a buildup of gallstones (cholelithiasis) in the gallbladder. Gallstones can block the flow of bile, which can result in inflammation and pain. In severe cases, emergency surgery may be required. This procedure is done though small incisions in your abdomen (laparoscopic surgery). A thin scope with a camera (laparoscope) is inserted through one incision. Thin surgical instruments are inserted through the other incisions. In some cases, a laparoscopic procedure may be turned into a type of surgery that is done through a larger incision (open surgery). Tell a health care provider about:  Any allergies you have.  All medicines you are taking, including vitamins, herbs, eye drops, creams, and over-the-counter medicines.  Any problems you or family members have had with anesthetic medicines.  Any blood disorders you have.  Any surgeries you have had.  Any medical conditions you have.  Whether you are pregnant or may be pregnant. What are the risks? Generally, this is a safe  procedure. However, problems may occur, including:  Infection.  Bleeding.  Allergic reactions to medicines.  Damage to other structures or organs.  A stone remaining in the common bile duct. The common bile duct carries bile from the gallbladder into the small intestine.  A bile leak from the cyst duct that is clipped when your gallbladder is removed.  What happens before the procedure? Staying hydrated Follow instructions from your health care provider about hydration, which may include:  Up to 2 hours before the procedure - you may continue to drink clear liquids, such as water, clear fruit juice, black coffee, and plain tea.  Eating and drinking restrictions Follow instructions from your health care provider about eating and drinking, which may include:  8 hours before the procedure - stop eating heavy meals or foods such as meat, fried foods, or fatty foods.  6 hours before the procedure - stop eating light meals or foods, such as toast or cereal.  6 hours before the procedure - stop drinking milk or drinks that contain milk.  2 hours before the procedure - stop drinking clear liquids.  Medicines  Ask your health care provider about: ? Changing or stopping your regular medicines. This is especially important if you are taking diabetes medicines or blood thinners. ? Taking medicines such as aspirin and ibuprofen. These medicines can thin your  blood. Do not take these medicines before your procedure if your health care provider instructs you not to.  You may be given antibiotic medicine to help prevent infection. General instructions  Let your health care provider know if you develop a cold or an infection before surgery.  Plan to have someone take you home from the hospital or clinic.  Ask your health care provider how your surgical site will be marked or identified. What happens during the procedure?  To reduce your risk of infection: ? Your health care team will  wash or sanitize their hands. ? Your skin will be washed with soap. ? Hair may be removed from the surgical area.  An IV tube may be inserted into one of your veins.  You will be given one or more of the following: ? A medicine to help you relax (sedative). ? A medicine to make you fall asleep (general anesthetic).  A breathing tube will be placed in your mouth.  Your surgeon will make several small cuts (incisions) in your abdomen.  The laparoscope will be inserted through one of the small incisions. The camera on the laparoscope will send images to a TV screen (monitor) in the operating room. This lets your surgeon see inside your abdomen.  Air-like gas will be pumped into your abdomen. This will expand your abdomen to give the surgeon more room to perform the surgery.  Other tools that are needed for the procedure will be inserted through the other incisions. The gallbladder will be removed through one of the incisions.  Your common bile duct may be examined. If stones are found in the common bile duct, they may be removed.  After your gallbladder has been removed, the incisions will be closed with stitches (sutures), staples, or skin glue.  Your incisions may be covered with a bandage (dressing). The procedure may vary among health care providers and hospitals. What happens after the procedure?  Your blood pressure, heart rate, breathing rate, and blood oxygen level will be monitored until the medicines you were given have worn off.  You will be given medicines as needed to control your pain.  Do not drive for 24 hours if you were given a sedative. This information is not intended to replace advice given to you by your health care provider. Make sure you discuss any questions you have with your health care provider. Document Released: 11/06/2005 Document Revised: 05/28/2016 Document Reviewed: 04/24/2016 Elsevier Interactive Patient Education  2018 [employer].

## 2018-04-18 NOTE — Patient Instructions (Signed)
Seth Savage  04/18/2018     @PREFPERIOPPHARMACY @   Your procedure is scheduled on 04/24/2018.  Report to [facility] at 7:00 A.M.  Call this number if you have problems the morning of surgery:  [phone]   Remember:  No food or drink after midnight.    Take these medicines the morning of surgery with A SIP OF WATER Wellbutrin, Flonase, Synthroid, Cozaar, Protonix    Do not wear jewelry, make-up or nail polish.  Do not wear lotions, powders, or perfumes, or deodorant.  Do not shave 48 hours prior to surgery.  Men may shave face and neck.  Do not bring valuables to the hospital.  [facility] is not responsible for any belongings or valuables.  Contacts, dentures or bridgework may not be worn into surgery.  Leave your suitcase in the car.  After surgery it may be brought to your room.  For patients admitted to the hospital, discharge time will be determined by your treatment team.  Patients discharged the day of surgery will not be allowed to drive home.    Please read over the following fact sheets that you were given. Surgical Site Infection Prevention and Anesthesia Post-op Instructions     PATIENT INSTRUCTIONS POST-ANESTHESIA  IMMEDIATELY FOLLOWING SURGERY:  Do not drive or operate machinery for the first twenty four hours after surgery.  Do not make any important decisions for twenty four hours after surgery or while taking narcotic pain medications or sedatives.  If you develop intractable nausea and vomiting or a severe headache please notify your doctor immediately.  FOLLOW-UP:  Please make an appointment with your surgeon as instructed. You do not need to follow up with anesthesia unless specifically instructed to do so.  WOUND CARE INSTRUCTIONS (if applicable):  Keep a dry clean dressing on the anesthesia/puncture wound site if there is drainage.  Once the wound has quit draining you may leave it open to air.  Generally you should leave the bandage intact for  twenty four hours unless there is drainage.  If the epidural site drains for more than 36-48 hours please call the anesthesia department.  QUESTIONS?:  Please feel free to call your physician or the hospital operator if you have any questions, and they will be happy to assist you.      Laparoscopic Cholecystectomy Laparoscopic cholecystectomy is surgery to remove the gallbladder. The gallbladder is a pear-shaped organ that lies beneath the liver on the right side of the body. The gallbladder stores bile, which is a fluid that helps the body to digest fats. Cholecystectomy is often done for inflammation of the gallbladder (cholecystitis). This condition is usually caused by a buildup of gallstones (cholelithiasis) in the gallbladder. Gallstones can block the flow of bile, which can result in inflammation and pain. In severe cases, emergency surgery may be required. This procedure is done though small incisions in your abdomen (laparoscopic surgery). A thin scope with a camera (laparoscope) is inserted through one incision. Thin surgical instruments are inserted through the other incisions. In some cases, a laparoscopic procedure may be turned into a type of surgery that is done through a larger incision (open surgery). Tell a health care provider about:  Any allergies you have.  All medicines you are taking, including vitamins, herbs, eye drops, creams, and over-the-counter medicines.  Any problems you or family members have had with anesthetic medicines.  Any blood disorders you have.  Any surgeries you have had.  Any medical conditions you have.  Whether you are pregnant or may be pregnant. What are the risks? Generally, this is a safe procedure. However, problems may occur, including:  Infection.  Bleeding.  Allergic reactions to medicines.  Damage to other structures or organs.  A stone remaining in the common bile duct. The common bile duct carries bile from the gallbladder into  the small intestine.  A bile leak from the cyst duct that is clipped when your gallbladder is removed.  What happens before the procedure? Staying hydrated Follow instructions from your health care provider about hydration, which may include:  Up to 2 hours before the procedure - you may continue to drink clear liquids, such as water, clear fruit juice, black coffee, and plain tea.  Eating and drinking restrictions Follow instructions from your health care provider about eating and drinking, which may include:  8 hours before the procedure - stop eating heavy meals or foods such as meat, fried foods, or fatty foods.  6 hours before the procedure - stop eating light meals or foods, such as toast or cereal.  6 hours before the procedure - stop drinking milk or drinks that contain milk.  2 hours before the procedure - stop drinking clear liquids.  Medicines  Ask your health care provider about: ? Changing or stopping your regular medicines. This is especially important if you are taking diabetes medicines or blood thinners. ? Taking medicines such as aspirin and ibuprofen. These medicines can thin your blood. Do not take these medicines before your procedure if your health care provider instructs you not to.  You may be given antibiotic medicine to help prevent infection. General instructions  Let your health care provider know if you develop a cold or an infection before surgery.  Plan to have someone take you home from the hospital or clinic.  Ask your health care provider how your surgical site will be marked or identified. What happens during the procedure?  To reduce your risk of infection: ? Your health care team will wash or sanitize their hands. ? Your skin will be washed with soap. ? Hair may be removed from the surgical area.  An IV tube may be inserted into one of your veins.  You will be given one or more of the following: ? A medicine to help you relax  (sedative). ? A medicine to make you fall asleep (general anesthetic).  A breathing tube will be placed in your mouth.  Your surgeon will make several small cuts (incisions) in your abdomen.  The laparoscope will be inserted through one of the small incisions. The camera on the laparoscope will send images to a TV screen (monitor) in the operating room. This lets your surgeon see inside your abdomen.  Air-like gas will be pumped into your abdomen. This will expand your abdomen to give the surgeon more room to perform the surgery.  Other tools that are needed for the procedure will be inserted through the other incisions. The gallbladder will be removed through one of the incisions.  Your common bile duct may be examined. If stones are found in the common bile duct, they may be removed.  After your gallbladder has been removed, the incisions will be closed with stitches (sutures), staples, or skin glue.  Your incisions may be covered with a bandage (dressing). The procedure may vary among health care providers and hospitals. What happens after the procedure?  Your blood pressure, heart rate, breathing rate, and blood oxygen level will be monitored until the  medicines you were given have worn off.  You will be given medicines as needed to control your pain.  Do not drive for 24 hours if you were given a sedative. This information is not intended to replace advice given to you by your health care provider. Make sure you discuss any questions you have with your health care provider. Document Released: 11/06/2005 Document Revised: 05/28/2016 Document Reviewed: 04/24/2016 Elsevier Interactive Patient Education  2018 [employer]. Open Cholecystectomy Open cholecystectomy is surgery to remove the gallbladder. The gallbladder is a pear-shaped organ that lies beneath the liver on the right side of the body. The gallbladder stores bile, which is a fluid that helps the body to digest fats.  Cholecystectomy is often done for inflammation of the gallbladder (cholecystitis). This condition is usually caused by a buildup of gallstones (cholelithiasis) in the gallbladder. Gallstones can block the flow of bile, which can result in inflammation and pain. In severe cases, emergency surgery may be required. Tell a health care provider about:  Any allergies you have.  All medicines you are taking, including vitamins, herbs, eye drops, creams, and over-the-counter medicines.  Any problems you or family members have had with anesthetic medicines.  Any blood disorders you have.  Any surgeries you have had.  Any medical conditions you have.  Whether you are pregnant or may be pregnant. What are the risks? Generally, this is a safe procedure. However, problems may occur, including:  Infection.  Bleeding.  Allergic reactions to medicines.  Damage to other structures or organs.  A stone remaining in the common bile duct. The common bile duct carries bile from the gallbladder into the small intestine.  A bile leak from the cyst duct that is clipped when your gallbladder is removed.  What happens before the procedure? Staying hydrated Follow instructions from your health care provider about hydration, which may include:  Up to 2 hours before the procedure - you may continue to drink clear liquids, such as water, clear fruit juice, black coffee, and plain tea.  Eating and drinking restrictions Follow instructions from your health care provider about eating and drinking, which may include:  8 hours before the procedure - stop eating heavy meals or foods such as meat, fried foods, or fatty foods.  6 hours before the procedure - stop eating light meals or foods, such as toast or cereal.  6 hours before the procedure - stop drinking milk or drinks that contain milk.  2 hours before the procedure - stop drinking clear liquids.  Medicines  Ask your health care provider  about: ? Changing or stopping your regular medicines. This is especially important if you are taking diabetes medicines or blood thinners. ? Taking medicines such as aspirin and ibuprofen. These medicines can thin your blood. Do not take these medicines before your procedure if your health care provider instructs you not to.  You may be given antibiotic medicine to help prevent infection. General instructions  Let your health care provider know if you develop a cold or an infection before surgery.  Plan to have someone take you home from the hospital or clinic.  Ask your health care provider how your surgical site will be marked or identified. What happens during the procedure?  To reduce your risk of infection: ? Your health care team will wash or sanitize their hands. ? Your skin will be washed with soap. ? Hair may be removed from the surgical area.  An IV tube may be inserted into  one of your veins.  You will be given one or more of the following: ? A medicine to help you relax (sedative). ? A medicine to make you fall asleep (general anesthetic).  A breathing tube will be placed in your mouth.  Your surgeon will make a cut (incision) in the upper abdomen to access your gallbladder.  Your gallbladder will be removed.  Your common bile duct may be examined. If stones are found in the common bile duct, they may be removed.  After your gallbladder has been removed, the incisions will be closed with stitches (sutures), skin glue, or staples.  Your incision will be covered with a bandage (dressing). The procedure may vary among health care providers and hospitals. What happens after the procedure?  Your blood pressure, heart rate, breathing rate, and blood oxygen level will be monitored until the medicines you were given have worn off.  You will be given medicines as needed to control your pain.  Do not drive for 24 hours if you were given a sedative. This information is  not intended to replace advice given to you by your health care provider. Make sure you discuss any questions you have with your health care provider. Document Released: 07/29/2002 Document Revised: 05/27/2016 Document Reviewed: 04/24/2016 Elsevier Interactive Patient Education  2018 [employer].

## 2018-04-19 ENCOUNTER — Encounter (HOSPITAL_COMMUNITY)
Admission: RE | Admit: 2018-04-19 | Discharge: 2018-04-19 | Disposition: A | Discharge status: Home / Self Care | Payer: Medicare Other | Source: Ambulatory Visit | Attending: General Surgery | Admitting: General Surgery

## 2018-04-19 ENCOUNTER — Other Ambulatory Visit: Payer: Self-pay

## 2018-04-19 ENCOUNTER — Encounter (HOSPITAL_COMMUNITY): Payer: Self-pay

## 2018-04-19 DIAGNOSIS — Z01812 Encounter for preprocedural laboratory examination: Secondary | ICD-10-CM | POA: Diagnosis not present

## 2018-04-19 LAB — CBC WITH DIFFERENTIAL/PLATELET
BASOS ABS: 0 10*3/uL (ref 0.0–0.1)
Basophils Relative: 0 %
Eosinophils Absolute: 0.1 10*3/uL (ref 0.0–0.7)
Eosinophils Relative: 1 %
HEMATOCRIT: 43.7 % (ref 39.0–52.0)
HEMOGLOBIN: 15 g/dL (ref 13.0–17.0)
LYMPHS PCT: 16 %
Lymphs Abs: 1.3 10*3/uL (ref 0.7–4.0)
MCH: 29.6 pg (ref 26.0–34.0)
MCHC: 34.3 g/dL (ref 30.0–36.0)
MCV: 86.2 fL (ref 78.0–100.0)
Monocytes Absolute: 0.6 10*3/uL (ref 0.1–1.0)
Monocytes Relative: 7 %
NEUTROS ABS: 6.1 10*3/uL (ref 1.7–7.7)
NEUTROS PCT: 76 %
Platelets: 201 10*3/uL (ref 150–400)
RBC: 5.07 MIL/uL (ref 4.22–5.81)
RDW: 13.3 % (ref 11.5–15.5)
WBC: 8.2 10*3/uL (ref 4.0–10.5)

## 2018-04-19 LAB — COMPREHENSIVE METABOLIC PANEL
ALT: 24 U/L (ref 17–63)
AST: 15 U/L (ref 15–41)
Albumin: 4.4 g/dL (ref 3.5–5.0)
Alkaline Phosphatase: 95 U/L (ref 38–126)
Anion gap: 9 (ref 5–15)
BILIRUBIN TOTAL: 0.8 mg/dL (ref 0.3–1.2)
BUN: 26 mg/dL — AB (ref 6–20)
CO2: 26 mmol/L (ref 22–32)
CREATININE: 1.31 mg/dL — AB (ref 0.61–1.24)
Calcium: 9.4 mg/dL (ref 8.9–10.3)
Chloride: 105 mmol/L (ref 101–111)
GFR calc Af Amer: >60 mL/min (ref >60)
GFR, EST NON AFRICAN AMERICAN: 56 mL/min — AB (ref >60)
GLUCOSE: 106 mg/dL — AB (ref 65–99)
Potassium: 3.8 mmol/L (ref 3.5–5.1)
Sodium: 140 mmol/L (ref 135–145)
Total Protein: 7.5 g/dL (ref 6.5–8.1)

## 2018-04-24 ENCOUNTER — Encounter (HOSPITAL_COMMUNITY)
Admission: AD | Disposition: A | Discharge status: Home / Self Care | Payer: Self-pay | Source: Ambulatory Visit | Attending: General Surgery

## 2018-04-24 ENCOUNTER — Other Ambulatory Visit: Payer: Self-pay

## 2018-04-24 ENCOUNTER — Ambulatory Visit (HOSPITAL_COMMUNITY): Payer: Medicare Other | Admitting: Anesthesiology

## 2018-04-24 ENCOUNTER — Encounter (HOSPITAL_COMMUNITY): Payer: Self-pay | Admitting: *Deleted

## 2018-04-24 ENCOUNTER — Observation Stay (HOSPITAL_COMMUNITY)
Admission: AD | Admit: 2018-04-24 | Discharge: 2018-04-25 | Disposition: A | Discharge status: Home / Self Care | Payer: Medicare Other | Source: Ambulatory Visit | Attending: General Surgery | Admitting: General Surgery

## 2018-04-24 DIAGNOSIS — I472 Ventricular tachycardia, unspecified: Secondary | ICD-10-CM

## 2018-04-24 DIAGNOSIS — Z87891 Personal history of nicotine dependence: Secondary | ICD-10-CM | POA: Diagnosis not present

## 2018-04-24 DIAGNOSIS — Z7982 Long term (current) use of aspirin: Secondary | ICD-10-CM | POA: Diagnosis not present

## 2018-04-24 DIAGNOSIS — J449 Chronic obstructive pulmonary disease, unspecified: Secondary | ICD-10-CM | POA: Diagnosis not present

## 2018-04-24 DIAGNOSIS — K8010 Calculus of gallbladder with chronic cholecystitis without obstruction: Secondary | ICD-10-CM | POA: Diagnosis not present

## 2018-04-24 DIAGNOSIS — K8050 Calculus of bile duct without cholangitis or cholecystitis without obstruction: Secondary | ICD-10-CM | POA: Diagnosis present

## 2018-04-24 DIAGNOSIS — I2583 Coronary atherosclerosis due to lipid rich plaque: Secondary | ICD-10-CM

## 2018-04-24 DIAGNOSIS — Z9049 Acquired absence of other specified parts of digestive tract: Secondary | ICD-10-CM | POA: Diagnosis not present

## 2018-04-24 DIAGNOSIS — I10 Essential (primary) hypertension: Secondary | ICD-10-CM | POA: Diagnosis not present

## 2018-04-24 DIAGNOSIS — E7800 Pure hypercholesterolemia, unspecified: Secondary | ICD-10-CM | POA: Insufficient documentation

## 2018-04-24 DIAGNOSIS — E039 Hypothyroidism, unspecified: Secondary | ICD-10-CM | POA: Diagnosis not present

## 2018-04-24 DIAGNOSIS — I2510 Atherosclerotic heart disease of native coronary artery without angina pectoris: Secondary | ICD-10-CM

## 2018-04-24 DIAGNOSIS — K828 Other specified diseases of gallbladder: Secondary | ICD-10-CM | POA: Diagnosis not present

## 2018-04-24 DIAGNOSIS — K219 Gastro-esophageal reflux disease without esophagitis: Secondary | ICD-10-CM | POA: Insufficient documentation

## 2018-04-24 DIAGNOSIS — Z79899 Other long term (current) drug therapy: Secondary | ICD-10-CM | POA: Insufficient documentation

## 2018-04-24 HISTORY — PX: CHOLECYSTECTOMY: SHX55

## 2018-04-24 LAB — CBC
HCT: 42.3 % (ref 39.0–52.0)
HEMOGLOBIN: 14.1 g/dL (ref 13.0–17.0)
MCH: 29.4 pg (ref 26.0–34.0)
MCHC: 33.3 g/dL (ref 30.0–36.0)
MCV: 88.1 fL (ref 78.0–100.0)
Platelets: 170 10*3/uL (ref 150–400)
RBC: 4.8 MIL/uL (ref 4.22–5.81)
RDW: 13.3 % (ref 11.5–15.5)
WBC: 6.5 10*3/uL (ref 4.0–10.5)

## 2018-04-24 LAB — BASIC METABOLIC PANEL
Anion gap: 7 (ref 5–15)
BUN: 23 mg/dL — AB (ref 6–20)
CHLORIDE: 101 mmol/L (ref 101–111)
CO2: 28 mmol/L (ref 22–32)
Calcium: 8.9 mg/dL (ref 8.9–10.3)
Creatinine, Ser: 1.24 mg/dL (ref 0.61–1.24)
GFR calc Af Amer: >60 mL/min (ref >60)
GFR calc non Af Amer: 59 mL/min — ABNORMAL LOW (ref >60)
GLUCOSE: 164 mg/dL — AB (ref 65–99)
POTASSIUM: 4.1 mmol/L (ref 3.5–5.1)
Sodium: 136 mmol/L (ref 135–145)

## 2018-04-24 LAB — TROPONIN I
Troponin I: <0.03 ng/mL (ref <0.03)
Troponin I: <0.03 ng/mL (ref <0.03)

## 2018-04-24 LAB — MRSA PCR SCREENING: MRSA by PCR: NEGATIVE

## 2018-04-24 SURGERY — LAPAROSCOPIC CHOLECYSTECTOMY
Anesthesia: General

## 2018-04-24 MED ORDER — MIDAZOLAM HCL 2 MG/2ML IJ SOLN
INTRAMUSCULAR | Status: AC
  Filled 2018-04-24: qty 2

## 2018-04-24 MED ORDER — ONDANSETRON HCL 4 MG/2ML IJ SOLN: 4.0000 mg | Freq: Four times a day (QID) | INTRAMUSCULAR | Status: DC | PRN

## 2018-04-24 MED ORDER — CIPROFLOXACIN IN D5W 400 MG/200ML IV SOLN
400.0000 mg | INTRAVENOUS | Status: AC
  Administered 2018-04-24: 400 mg via INTRAVENOUS
  Filled 2018-04-24: qty 200

## 2018-04-24 MED ORDER — SUCCINYLCHOLINE 20MG/ML (10ML) SYRINGE FOR MEDFUSION PUMP - OPTIME
INTRAMUSCULAR | Status: DC | PRN
  Administered 2018-04-24: 120 mg via INTRAVENOUS

## 2018-04-24 MED ORDER — HYDROCHLOROTHIAZIDE 25 MG PO TABS
12.5000 mg | ORAL_TABLET | Freq: Every day | ORAL | Status: DC
  Administered 2018-04-24 – 2018-04-25 (×2): 12.5 mg via ORAL
  Filled 2018-04-24 (×2): qty 1

## 2018-04-24 MED ORDER — SUCCINYLCHOLINE CHLORIDE 20 MG/ML IJ SOLN
INTRAMUSCULAR | Status: AC
  Filled 2018-04-24: qty 1

## 2018-04-24 MED ORDER — HYDROMORPHONE HCL 1 MG/ML IJ SOLN
1.0000 mg | INTRAMUSCULAR | Status: DC | PRN
  Administered 2018-04-25: 1 mg via INTRAVENOUS
  Filled 2018-04-24: qty 1

## 2018-04-24 MED ORDER — GLYCOPYRROLATE 0.2 MG/ML IJ SOLN
INTRAMUSCULAR | Status: DC | PRN
  Administered 2018-04-24: .6 mg via INTRAVENOUS

## 2018-04-24 MED ORDER — PANTOPRAZOLE SODIUM 40 MG PO TBEC
40.0000 mg | DELAYED_RELEASE_TABLET | Freq: Every day | ORAL | Status: DC
  Administered 2018-04-24 – 2018-04-25 (×2): 40 mg via ORAL
  Filled 2018-04-24 (×2): qty 1

## 2018-04-24 MED ORDER — FENTANYL CITRATE (PF) 100 MCG/2ML IJ SOLN
INTRAMUSCULAR | Status: DC | PRN
  Administered 2018-04-24 (×4): 50 ug via INTRAVENOUS

## 2018-04-24 MED ORDER — LACTATED RINGERS IV SOLN
INTRAVENOUS | Status: DC
  Administered 2018-04-24 (×2): via INTRAVENOUS

## 2018-04-24 MED ORDER — HYDROMORPHONE HCL 1 MG/ML IJ SOLN
0.2500 mg | INTRAMUSCULAR | Status: DC | PRN
  Administered 2018-04-24 (×2): 0.5 mg via INTRAVENOUS
  Filled 2018-04-24 (×2): qty 0.5

## 2018-04-24 MED ORDER — LACTATED RINGERS IV SOLN
INTRAVENOUS | Status: DC
  Administered 2018-04-24 (×2): via INTRAVENOUS

## 2018-04-24 MED ORDER — EPHEDRINE SULFATE 50 MG/ML IJ SOLN
INTRAMUSCULAR | Status: DC | PRN
  Administered 2018-04-24: 10 mg via INTRAVENOUS

## 2018-04-24 MED ORDER — ONDANSETRON HCL 4 MG/2ML IJ SOLN
INTRAMUSCULAR | Status: AC
  Filled 2018-04-24: qty 2

## 2018-04-24 MED ORDER — TRAMADOL HCL 50 MG PO TABS
50.0000 mg | ORAL_TABLET | Freq: Four times a day (QID) | ORAL | Status: DC | PRN
  Administered 2018-04-24: 50 mg via ORAL
  Filled 2018-04-24: qty 1

## 2018-04-24 MED ORDER — ROCURONIUM BROMIDE 50 MG/5ML IV SOLN
INTRAVENOUS | Status: AC
  Filled 2018-04-24: qty 1

## 2018-04-24 MED ORDER — MEPERIDINE HCL 50 MG/ML IJ SOLN: 6.2500 mg | INTRAMUSCULAR | Status: DC | PRN

## 2018-04-24 MED ORDER — ONDANSETRON 4 MG PO TBDP: 4.0000 mg | ORAL_TABLET | Freq: Four times a day (QID) | ORAL | Status: DC | PRN

## 2018-04-24 MED ORDER — ONDANSETRON HCL 4 MG/2ML IJ SOLN
INTRAMUSCULAR | Status: DC | PRN
  Administered 2018-04-24: 4 mg via INTRAVENOUS

## 2018-04-24 MED ORDER — FLUTICASONE PROPIONATE 50 MCG/ACT NA SUSP
2.0000 | Freq: Every day | NASAL | Status: DC | PRN
Start: 2018-04-24 — End: 2018-04-25

## 2018-04-24 MED ORDER — ACETAMINOPHEN 500 MG PO TABS
1000.0000 mg | ORAL_TABLET | Freq: Four times a day (QID) | ORAL | Status: DC
  Administered 2018-04-24 – 2018-04-25 (×3): 1000 mg via ORAL
  Filled 2018-04-24 (×3): qty 2

## 2018-04-24 MED ORDER — LIDOCAINE HCL (PF) 1 % IJ SOLN
INTRAMUSCULAR | Status: AC
  Filled 2018-04-24: qty 5

## 2018-04-24 MED ORDER — ASPIRIN EC 81 MG PO TBEC
81.0000 mg | DELAYED_RELEASE_TABLET | Freq: Every day | ORAL | Status: DC
  Administered 2018-04-24: 81 mg via ORAL
  Filled 2018-04-24 (×2): qty 1

## 2018-04-24 MED ORDER — HYDROCODONE-ACETAMINOPHEN 7.5-325 MG PO TABS: 1.0000 | ORAL_TABLET | Freq: Once | ORAL | Status: DC | PRN

## 2018-04-24 MED ORDER — HEMOSTATIC AGENTS (NO CHARGE) OPTIME
TOPICAL | Status: DC | PRN
  Administered 2018-04-24: 1 via TOPICAL

## 2018-04-24 MED ORDER — NEOSTIGMINE METHYLSULFATE 10 MG/10ML IV SOLN
INTRAVENOUS | Status: DC | PRN
  Administered 2018-04-24: 4 mg via INTRAVENOUS

## 2018-04-24 MED ORDER — BUPROPION HCL ER (SR) 150 MG PO TB12
150.0000 mg | ORAL_TABLET | Freq: Two times a day (BID) | ORAL | Status: DC
  Administered 2018-04-24 – 2018-04-25 (×2): 150 mg via ORAL
  Filled 2018-04-24 (×3): qty 1

## 2018-04-24 MED ORDER — LEVOTHYROXINE SODIUM 75 MCG PO TABS
150.0000 ug | ORAL_TABLET | Freq: Every day | ORAL | Status: DC
  Administered 2018-04-25: 150 ug via ORAL
  Filled 2018-04-24: qty 2

## 2018-04-24 MED ORDER — PROPOFOL 10 MG/ML IV BOLUS
INTRAVENOUS | Status: AC
  Filled 2018-04-24: qty 20

## 2018-04-24 MED ORDER — LOSARTAN POTASSIUM 50 MG PO TABS
100.0000 mg | ORAL_TABLET | Freq: Every day | ORAL | Status: DC
  Administered 2018-04-24 – 2018-04-25 (×2): 100 mg via ORAL
  Filled 2018-04-24 (×2): qty 2

## 2018-04-24 MED ORDER — MIDAZOLAM HCL 5 MG/5ML IJ SOLN
INTRAMUSCULAR | Status: DC | PRN
  Administered 2018-04-24: 2 mg via INTRAVENOUS

## 2018-04-24 MED ORDER — ROCURONIUM 10MG/ML (10ML) SYRINGE FOR MEDFUSION PUMP - OPTIME
INTRAVENOUS | Status: DC | PRN
  Administered 2018-04-24: 35 mg via INTRAVENOUS
  Administered 2018-04-24: 5 mg via INTRAVENOUS

## 2018-04-24 MED ORDER — PROPOFOL 10 MG/ML IV BOLUS
INTRAVENOUS | Status: DC | PRN
  Administered 2018-04-24: 150 mg via INTRAVENOUS

## 2018-04-24 MED ORDER — ONDANSETRON HCL 4 MG/2ML IJ SOLN: 4.0000 mg | Freq: Once | INTRAMUSCULAR | Status: DC | PRN

## 2018-04-24 MED ORDER — BUPIVACAINE LIPOSOME 1.3 % IJ SUSP
INTRAMUSCULAR | Status: DC | PRN
  Administered 2018-04-24: 20 mL

## 2018-04-24 MED ORDER — POVIDONE-IODINE 10 % OINT PACKET
TOPICAL_OINTMENT | CUTANEOUS | Status: DC | PRN
  Administered 2018-04-24: 1 via TOPICAL

## 2018-04-24 MED ORDER — SUGAMMADEX SODIUM 200 MG/2ML IV SOLN
INTRAVENOUS | Status: DC | PRN
  Administered 2018-04-24: 50 mg via INTRAVENOUS

## 2018-04-24 MED ORDER — CHLORHEXIDINE GLUCONATE CLOTH 2 % EX PADS
6.0000 | MEDICATED_PAD | Freq: Once | CUTANEOUS | Status: DC
  Administered 2018-04-24: 6 via TOPICAL

## 2018-04-24 MED ORDER — SODIUM CHLORIDE 0.9 % IR SOLN
Status: DC | PRN
  Administered 2018-04-24: 1000 mL

## 2018-04-24 MED ORDER — ATORVASTATIN CALCIUM 10 MG PO TABS
10.0000 mg | ORAL_TABLET | Freq: Every day | ORAL | Status: DC
  Administered 2018-04-24 – 2018-04-25 (×2): 10 mg via ORAL
  Filled 2018-04-24 (×2): qty 1

## 2018-04-24 MED ORDER — LIDOCAINE HCL (CARDIAC) PF 50 MG/5ML IV SOSY
PREFILLED_SYRINGE | INTRAVENOUS | Status: DC | PRN
  Administered 2018-04-24: 40 mg via INTRAVENOUS

## 2018-04-24 MED ORDER — ENOXAPARIN SODIUM 40 MG/0.4ML ~~LOC~~ SOLN: 40.0000 mg | SUBCUTANEOUS | Status: DC

## 2018-04-24 MED ORDER — ENOXAPARIN SODIUM 60 MG/0.6ML ~~LOC~~ SOLN
50.0000 mg | SUBCUTANEOUS | Status: DC
  Administered 2018-04-25: 50 mg via SUBCUTANEOUS
  Filled 2018-04-24: qty 0.6

## 2018-04-24 MED ORDER — FENTANYL CITRATE (PF) 250 MCG/5ML IJ SOLN
INTRAMUSCULAR | Status: AC
  Filled 2018-04-24: qty 5

## 2018-04-24 MED ORDER — SIMETHICONE 80 MG PO CHEW: 40.0000 mg | CHEWABLE_TABLET | Freq: Four times a day (QID) | ORAL | Status: DC | PRN

## 2018-04-24 SURGICAL SUPPLY — 52 items
APPLIER CLIP ROT 10 11.4 M/L (STAPLE) ×3
APR CLP MED LRG 11.4X10 (STAPLE) ×1
BAG RETRIEVAL 10 (BASKET) ×1
BAG RETRIEVAL 10MM (BASKET) ×1
CHLORAPREP W/TINT 26ML (MISCELLANEOUS) ×3 IMPLANT
CLIP APPLIE ROT 10 11.4 M/L (STAPLE) ×1 IMPLANT
CLOTH BEACON ORANGE TIMEOUT ST (SAFETY) ×3 IMPLANT
COVER LIGHT HANDLE STERIS (MISCELLANEOUS) ×6 IMPLANT
DECANTER SPIKE VIAL GLASS SM (MISCELLANEOUS) ×3 IMPLANT
DRSG TEGADERM 2-3/8X2-3/4 SM (GAUZE/BANDAGES/DRESSINGS) ×6 IMPLANT
ELECT REM PT RETURN 9FT ADLT (ELECTROSURGICAL) ×3
ELECTRODE REM PT RTRN 9FT ADLT (ELECTROSURGICAL) ×1 IMPLANT
FILTER SMOKE EVAC LAPAROSHD (FILTER) ×3 IMPLANT
GLOVE BIOGEL M 6.5 STRL (GLOVE) ×4 IMPLANT
GLOVE BIOGEL PI IND STRL 6.5 (GLOVE) IMPLANT
GLOVE BIOGEL PI IND STRL 7.0 (GLOVE) ×1 IMPLANT
GLOVE BIOGEL PI IND STRL 7.5 (GLOVE) IMPLANT
GLOVE BIOGEL PI INDICATOR 6.5 (GLOVE) ×4
GLOVE BIOGEL PI INDICATOR 7.0 (GLOVE) ×2
GLOVE BIOGEL PI INDICATOR 7.5 (GLOVE) ×2
GLOVE SURG SS PI 7.5 STRL IVOR (GLOVE) ×3 IMPLANT
GOWN STRL REUS W/ TWL XL LVL3 (GOWN DISPOSABLE) ×1 IMPLANT
GOWN STRL REUS W/TWL LRG LVL3 (GOWN DISPOSABLE) ×6 IMPLANT
GOWN STRL REUS W/TWL XL LVL3 (GOWN DISPOSABLE) ×3
HEMOSTAT SNOW SURGICEL 2X4 (HEMOSTASIS) ×3 IMPLANT
INST SET LAPROSCOPIC AP (KITS) ×3 IMPLANT
IV NS IRRIG 3000ML ARTHROMATIC (IV SOLUTION) IMPLANT
KIT TURNOVER KIT A (KITS) ×3 IMPLANT
MANIFOLD NEPTUNE II (INSTRUMENTS) ×3 IMPLANT
NDL HYPO 25X1 1.5 SAFETY (NEEDLE) ×1 IMPLANT
NDL INSUFFLATION 14GA 120MM (NEEDLE) ×1 IMPLANT
NEEDLE HYPO 25X1 1.5 SAFETY (NEEDLE) ×3 IMPLANT
NEEDLE INSUFFLATION 14GA 120MM (NEEDLE) ×3 IMPLANT
NS IRRIG 1000ML POUR BTL (IV SOLUTION) ×3 IMPLANT
PACK LAP CHOLE LZT030E (CUSTOM PROCEDURE TRAY) ×3 IMPLANT
PAD ARMBOARD 7.5X6 YLW CONV (MISCELLANEOUS) ×3 IMPLANT
SET BASIN LINEN APH (SET/KITS/TRAYS/PACK) ×3 IMPLANT
SET TUBE IRRIG SUCTION NO TIP (IRRIGATION / IRRIGATOR) IMPLANT
SLEEVE ENDOPATH XCEL 5M (ENDOMECHANICALS) ×3 IMPLANT
SPONGE GAUZE 2X2 8PLY STER LF (GAUZE/BANDAGES/DRESSINGS) ×3
SPONGE GAUZE 2X2 8PLY STRL LF (GAUZE/BANDAGES/DRESSINGS) ×7 IMPLANT
STAPLER VISISTAT (STAPLE) ×3 IMPLANT
SUT VICRYL 0 UR6 27IN ABS (SUTURE) ×3 IMPLANT
SYS BAG RETRIEVAL 10MM (BASKET) ×1
SYSTEM BAG RETRIEVAL 10MM (BASKET) ×1 IMPLANT
TROCAR ENDO BLADELESS 11MM (ENDOMECHANICALS) ×3 IMPLANT
TROCAR XCEL NON-BLD 5MMX100MML (ENDOMECHANICALS) ×3 IMPLANT
TROCAR XCEL UNIV SLVE 11M 100M (ENDOMECHANICALS) ×3 IMPLANT
TUBE CONNECTING 12'X1/4 (SUCTIONS) ×1
TUBE CONNECTING 12X1/4 (SUCTIONS) ×2 IMPLANT
TUBING INSUFFLATION (TUBING) ×3 IMPLANT
WARMER LAPAROSCOPE (MISCELLANEOUS) ×3 IMPLANT

## 2018-04-24 NOTE — Interval H&P Note (Signed)
History and Physical Interval Note:  04/24/2018 8:31 AM  [name]  has presented today for surgery, with the diagnosis of cholelithiasis  The various methods of treatment have been discussed with the patient and family. After consideration of risks, benefits and other options for treatment, the patient has consented to  Procedure(s): LAPAROSCOPIC CHOLECYSTECTOMY (POSSIBLE OPEN) (N/A) as a surgical intervention .  The patient's history has been reviewed, patient examined, no change in status, stable for surgery.  I have reviewed the patient's chart and labs.  Questions were answered to the patient's satisfaction.     Aviva Signs

## 2018-04-24 NOTE — Anesthesia Preprocedure Evaluation (Signed)
Anesthesia Evaluation  Patient identified by MRN, date of birth, ID band Patient awake    Reviewed: Allergy & Precautions, H&P , NPO status , Patient's Chart, lab work & pertinent test results, reviewed documented beta blocker date and time   Airway Mallampati: II  TM Distance: >3 FB Neck ROM: full    Dental no notable dental hx.    Pulmonary neg pulmonary ROS, shortness of breath, asthma , pneumonia, COPD, former smoker,    Pulmonary exam normal breath sounds clear to auscultation       Cardiovascular Exercise Tolerance: Good hypertension, + angina + CAD and + Peripheral Vascular Disease  negative cardio ROS   Rhythm:regular Rate:Normal     Neuro/Psych  Headaches, negative neurological ROS  negative psych ROS   GI/Hepatic negative GI ROS, Neg liver ROS, PUD, GERD  ,  Endo/Other  negative endocrine ROSHypothyroidism   Renal/GU negative Renal ROS  negative genitourinary   Musculoskeletal   Abdominal   Peds  Hematology negative hematology ROS (+)   Anesthesia Other Findings   Reproductive/Obstetrics negative OB ROS                             Anesthesia Physical Anesthesia Plan  ASA: III  Anesthesia Plan: General   Post-op Pain Management:    Induction:   PONV Risk Score and Plan:   Airway Management Planned:   Additional Equipment:   Intra-op Plan:   Post-operative Plan:   Informed Consent: I have reviewed the patients History and Physical, chart, labs and discussed the procedure including the risks, benefits and alternatives for the proposed anesthesia with the patient or authorized representative who has indicated his/her understanding and acceptance.     Plan Discussed with: CRNA  Anesthesia Plan Comments:         Anesthesia Quick Evaluation

## 2018-04-24 NOTE — Op Note (Signed)
Patient:  Seth Savage  DOB:  [birth year]  MRN:  [id number]   Preop Diagnosis: Biliary colic, biliary sludge  Postop Diagnosis: Same  Procedure: Laparoscopic cholecystectomy  Surgeon: Aviva Signs, MD  Assistant: Blake Divine, MD  Anes: General tracheal  Indications: Patient is a 67 year old white male who was referred for biliary colic secondary to biliary sludge.  He had a previous cardiac work-up which was negative for coronary artery disease.  The risks and benefits of the procedure including bleeding, infection, hepatobiliary injury, and the possibility of an open procedure were fully explained to the patient, who gave informed consent.  Procedure note: The patient was placed in supine position.  After induction of general endotracheal anesthesia, the abdomen was prepped and draped using the usual sterile technique with DuraPrep.  Surgical site confirmation was performed.  A supraumbilical incision was made down to the fascia.  A Veress needle was introduced into the abdominal cavity and confirmation of placement was done using the saline drop test.  The abdomen was then insufflated to 15 mmHg pressure.  An 11 mm trocar was introduced into the abdominal cavity under direct visualization without difficulty.  The patient was placed in reverse Trendelenburg position and an additional 11 mm trocar was placed in the epigastric region and 5 mm trochars were placed in the right upper quadrant and right flank regions.  The liver was inspected and noted within normal limits.  The gallbladder was retracted in a dynamic fashion in order to provide a critical view of the triangle of Calot.  The cystic duct was first identified.  Its juncture to the infundibulum was fully identified.  Endoclips were placed proximally and distally on the cystic duct, and the cystic duct was divided.  This was likewise done the cystic artery.  The gallbladder was freed away from the gallbladder fossa using Bovie  electrocautery.  The gallbladder was delivered through the epigastric trocar site using an Endo Catch bag.  The gallbladder fossa was inspected and no abnormal bleeding or bile leakage was noted.  Surgicel was placed in the gallbladder fossa.  All fluid and air were then evacuated from the abdominal cavity prior to the removal of the trochars.  All wounds were irrigated with normal saline.  All wounds were injected with Exparel.  The epigastric fascia as well as supraumbilical fascia were reapproximated using 0 Vicryl interrupted sutures.  All skin incisions were closed using staples.  Betadine ointment and dry sterile dressings were applied.  While the incisions were being closed, the patient had an episode of V. Tach.  Compressions were immediately started and the patient came out of it back into normal sinus rhythm without a shock or epinephrine.  The patient had received a dose of neostigmine around this time.  The patient never lost a pulse and his blood pressure was stable at the end of this event.  He was extubated and went to recovery room in stable condition.  Twelve-lead EKG, troponin, being that, and CBC have all been ordered.  In discussion with the family, they state that he has had previous episodes of heart palpitations that seem to resolve after he bumps his chest.  They said that cardiology was not aware of these episodes.  Cardiology was immediately consulted and there consultation is pending.  Further disposition is pending that consultation.  Complications: V. tach at end of procedure  EBL: Minimal  Specimen: Gallbladder

## 2018-04-24 NOTE — Transfer of Care (Addendum)
Immediate Anesthesia Transfer of Care Note  Patient: Seth Savage  Procedure(s) Performed: LAPAROSCOPIC CHOLECYSTECTOMY (N/A )  Patient Location: PACU  Anesthesia Type:General  Level of Consciousness: awake and alert   Airway & Oxygen Therapy: Patient Spontanous Breathing and Patient connected to face mask oxygen  Post-op Assessment: Report given to RN  Post vital signs: Reviewed and stable  Last Vitals:  Vitals Value Taken Time  BP 187/95 04/24/2018 10:30 AM  Temp 36.4 C 04/24/2018 10:30 AM  Pulse 86 04/24/2018 10:41 AM  Resp 15 04/24/2018 10:41 AM  SpO2 100 % 04/24/2018 10:41 AM  Vitals shown include unvalidated device data.  Last Pain:  Vitals:   04/24/18 1030  TempSrc:   PainSc: 5       Patients Stated Pain Goal: 5 (29/52/84 1324)  Complications: Patient had some VT in OR.  Dr. Arnoldo Morale present, started CPR, but patient spontaneously returned to [place].  In PACU patient is stable ,denies chest pain, but given 50 mcg of Fentanyl for operative sight pain.  Cardiology consulted, 12 lead EKG obtained.  Will wait in PACU and ICU bed requested in case patient is admitted.  Labs drawn.

## 2018-04-24 NOTE — Anesthesia Procedure Notes (Signed)
Procedure Name: Intubation Date/Time: 04/24/2018 9:35 AM Performed by: Ollen Bowl, CRNA Pre-anesthesia Checklist: Patient identified, Patient being monitored, Timeout performed, Emergency Drugs available and Suction available Patient Re-evaluated:Patient Re-evaluated prior to induction Oxygen Delivery Method: Circle system utilized Preoxygenation: Pre-oxygenation with 100% oxygen Induction Type: IV induction Ventilation: Mask ventilation without difficulty Laryngoscope Size: Mac and 3 Grade View: Grade II Tube type: Oral Tube size: 7.0 mm Number of attempts: 1 Airway Equipment and Method: Stylet Placement Confirmation: ETT inserted through vocal cords under direct vision,  positive ETCO2 and breath sounds checked- equal and bilateral Secured at: 21 cm Tube secured with: Tape Dental Injury: Teeth and Oropharynx as per pre-operative assessment

## 2018-04-24 NOTE — Anesthesia Postprocedure Evaluation (Signed)
Anesthesia Post Note  Patient: Seth Savage  Procedure(s) Performed: LAPAROSCOPIC CHOLECYSTECTOMY (N/A )  Patient location during evaluation: PACU Anesthesia Type: General Level of consciousness: awake and alert and oriented Pain management: pain level controlled Vital Signs Assessment: post-procedure vital signs reviewed and stable Respiratory status: spontaneous breathing Cardiovascular status: blood pressure returned to baseline and stable Postop Assessment: no apparent nausea or vomiting Anesthetic complications: no Comments: Holding over night for observation and cardiology consulted.     Last Vitals:  Vitals:   04/24/18 1145 04/24/18 1147  BP: (!) 174/76   Pulse: 71 72  Resp: 14 14  Temp:    SpO2: 100% 100%    Last Pain:  Vitals:   04/24/18 1147  TempSrc:   PainSc: 6                  Anatasia Tino

## 2018-04-24 NOTE — Consult Note (Signed)
Medical Consultation   Seth Savage  UJW:[id number]  DOB: 04-Jan-1951  DOA: 04/24/2018  PCP: Ignatius Specking, MD    Requesting physician: Dr. Lovell Sheehan  Reason for consultation: Post-op medical management/Vtach  History of Present Illness: Seth Savage is an 67 y.o. male with history of hypertension, hypothyroidism, dyslipidemia, GERD, carotid artery stenosis status post bilateral CEA as well as nonobstructive CAD with recent catheterization on 02/2018.  He underwent planned laparoscopic cholecystectomy today after he was referred for biliary colic secondary to biliary sludge.  He postoperatively went into a wide-complex tachycardia consistent with ventricular tachyarrhythmia and received some chest compressions and reverted back to normal sinus rhythm.  He did not require any epinephrine or shocks.  His initial troponin is thus far negative and he has been moved to intensive care unit for close monitoring.  He has been seen by cardiology with recommendations to initiate amiodarone should he continue to have persistent tachyarrhythmias and troponins have been ordered to cycle.  He currently denies any symptomatic complaints or concerns.  Of note, patient has apparently had some episodes of palpitations over the last several months and he typically does a "fist thump" on his sternal region with typical resolution of symptoms.  Review of Systems: All others reviewed and otherwise negative.   Past Medical History: Past Medical History:  Diagnosis Date  . Arthritis    "wrists, thumb on left hand" (03/12/2018)  . Back injury 2003   "broke my back in 2 places; never had any OR for it" (03/12/2018)  . Carotid artery occlusion   . Childhood asthma   . COPD (chronic obstructive pulmonary disease) (HCC)   . Essential hypertension   . GERD (gastroesophageal reflux disease)   . Goiter    Radioactive iodine treatment  . H pylori ulcer Jan 2016   Treated with Prevpac  . History of  bronchitis   . History of kidney stones   . Hypercholesterolemia   . Hypertension   . Hypothyroid   . Migraine    "been about 2 yr w/out a headache" (03/12/2018)  . Neuropathy   . Pneumonia 1969  . Shortness of breath dyspnea    with exertion  . Wears glasses     Past Surgical History: Past Surgical History:  Procedure Laterality Date  . BIOPSY  05/01/2017   Procedure: BIOPSY;  Surgeon: Corbin Ade, MD;  Location: [facility];  Service: Endoscopy;;  gastric  . CARDIAC CATHETERIZATION N/A 07/05/2016   Procedure: Left Heart Cath and Coronary Angiography;  Surgeon: Kathleene Hazel, MD;  Location: [facility] INVASIVE CV LAB;  Service: Cardiovascular;  Laterality: N/A;  . CARDIAC CATHETERIZATION  03/12/2018  . CAROTID ENDARTERECTOMY Left ?2009  . CARPAL TUNNEL RELEASE Right   . COLECTOMY  2000   Secondary to intussuception  . COLONOSCOPY  08/24/2004   Normal rectum,Small polyp at 25 cm, cold snared/ The remainder of the colonic mucosa appeared normal  . COLONOSCOPY  09/24/2012   Dr. Elly Modena hemorrhoids-likely source of hematochezia.Colonic diverticulosis  . COLONOSCOPY N/A 05/01/2017   Dr. Jena Gauss: Diverticulosis, next colonoscopy in 5 years.  Marland Kitchen ENDARTERECTOMY Right 07/07/2016   Procedure: ENDARTERECTOMY CAROTID;  Surgeon: Larina Earthly, MD;  Location: [facility] OR;  Service: Vascular;  Laterality: Right;  . ESOPHAGOGASTRODUODENOSCOPY N/A 12/09/2014   Dr. Jena Gauss: Erosive reflux esophagitis. Peptic Ulcer disease secondary to H.pylori. small hiatal hernia. Nonbleeding duodenal AVM. Duodenal erosions. TREATED WITH  PREVPAC  . ESOPHAGOGASTRODUODENOSCOPY N/A 03/15/2015   Dr. Jena Gauss: small hiatal hernia, healed PUD, duodenal AVM.   Marland Kitchen ESOPHAGOGASTRODUODENOSCOPY N/A 05/01/2017   Dr. Jena Gauss: LA grade a esophagitis, gastritis, no H pylori  . FRACTURE SURGERY    . LEFT HEART CATH AND CORONARY ANGIOGRAPHY N/A 03/12/2018   Procedure: LEFT HEART CATH AND CORONARY ANGIOGRAPHY;  Surgeon: Kathleene Hazel, MD;  Location: [facility] INVASIVE CV LAB;  Service: Cardiovascular;  Laterality: N/A;  . Multiple bilateral arm/wrist surgeries after falls    . ORIF SHOULDER FRACTURE Left 06/2002   "shattered; put a bunch of metal plates in it"  . PATCH ANGIOPLASTY Right 07/07/2016   Procedure: PATCH ANGIOPLASTY USING 0.8CM X 7.6CM HEMASHIELD PATCH;  Surgeon: Larina Earthly, MD;  Location: [facility] OR;  Service: Vascular;  Laterality: Right;  . SHOULDER ARTHROSCOPY W/ ROTATOR CUFF REPAIR Right 1999  . SHOULDER OPEN ROTATOR CUFF REPAIR Left 1992   Metal implant      Allergies:   Allergies  Allergen Reactions  . Lyrica [Pregabalin] Shortness Of Breath  . Celecoxib Other (See Comments)    GI BLEED, FATIGUE  . Codeine Itching     Social History:  reports that he quit smoking about 4 months ago. His smoking use included cigarettes. He has a 9.00 pack-year smoking history. He has never used smokeless tobacco. He reports that he drinks alcohol. He reports that he does not use drugs.   Family History: Family History  Problem Relation Age of Onset  . COPD Mother   . Mental illness Mother   . Hypertension Mother   . Varicose Veins Mother   . Heart attack Mother 43  . Hypertension Father   . Alcohol abuse Father   . Colon cancer Neg Hx      Physical Exam: Vitals:   04/24/18 1215 04/24/18 1230 04/24/18 1245 04/24/18 1300  BP: (!) 152/85 (!) 141/78 (!) 149/69 (!) 150/77  Pulse: (!) 59 64 64 73  Resp: 13 12 10 10   Temp:      TempSrc:      SpO2: 100% 100% 98% 99%    Constitutional: Alert and awake, oriented x3, not in any acute distress. Eyes: PERLA, EOMI, irises appear normal, anicteric sclera,  ENMT: external ears and nose appear normal            Lips appears normal, oropharynx mucosa, tongue, posterior pharynx appear normal  Neck: neck appears normal, no masses, normal ROM, no thyromegaly, no JVD  CVS: S1-S2 clear, no murmur rubs or gallops, no LE edema, normal pedal pulses    Respiratory:  clear to auscultation bilaterally, no wheezing, rales or rhonchi. Respiratory effort normal. No accessory muscle use.  Abdomen: soft nontender, nondistended, normal bowel sounds, no hepatosplenomegaly, no hernias; incisions c/d/i with dry dressings. Musculoskeletal: : no cyanosis, clubbing or edema noted bilaterally Skin: no rashes or lesions or ulcers, no induration or nodules   Data reviewed:  I have personally reviewed following labs and imaging studies Labs:  CBC: Recent Labs  Lab 04/19/18 1434 04/24/18 1047  WBC 8.2 6.5  NEUTROABS 6.1  --   HGB 15.0 14.1  HCT 43.7 42.3  MCV 86.2 88.1  PLT 201 170    Basic Metabolic Panel: Recent Labs  Lab 04/19/18 1434 04/24/18 1047  NA 140 136  K 3.8 4.1  CL 105 101  CO2 26 28  GLUCOSE 106* 164*  BUN 26* 23*  CREATININE 1.31* 1.24  CALCIUM 9.4 8.9   GFR Estimated  Creatinine Clearance: 72.7 mL/min (by C-G formula based on SCr of 1.24 mg/dL). Liver Function Tests: Recent Labs  Lab 04/19/18 1434  AST 15  ALT 24  ALKPHOS 95  BILITOT 0.8  PROT 7.5  ALBUMIN 4.4   No results for input(s): LIPASE, AMYLASE in the last 168 hours. No results for input(s): AMMONIA in the last 168 hours. Coagulation profile No results for input(s): INR, PROTIME in the last 168 hours.  Cardiac Enzymes: Recent Labs  Lab 04/24/18 1047  TROPONINI <0.03   BNP: Invalid input(s): POCBNP CBG: No results for input(s): GLUCAP in the last 168 hours. D-Dimer No results for input(s): DDIMER in the last 72 hours. Hgb A1c No results for input(s): HGBA1C in the last 72 hours. Lipid Profile No results for input(s): CHOL, HDL, LDLCALC, TRIG, CHOLHDL, LDLDIRECT in the last 72 hours. Thyroid function studies No results for input(s): TSH, T4TOTAL, T3FREE, THYROIDAB in the last 72 hours.  Invalid input(s): FREET3 Anemia work up No results for input(s): VITAMINB12, FOLATE, FERRITIN, TIBC, IRON, RETICCTPCT in the last 72 hours. Urinalysis     Component Value Date/Time   COLORURINE YELLOW 06/29/2016 0952   APPEARANCEUR CLEAR 06/29/2016 0952   LABSPEC 1.023 06/29/2016 0952   PHURINE 6.0 06/29/2016 0952   GLUCOSEU NEGATIVE 06/29/2016 0952   HGBUR NEGATIVE 06/29/2016 0952   BILIRUBINUR NEGATIVE 06/29/2016 0952   KETONESUR NEGATIVE 06/29/2016 0952   PROTEINUR NEGATIVE 06/29/2016 0952   UROBILINOGEN 0.2 08/28/2014 0410   NITRITE NEGATIVE 06/29/2016 0952   LEUKOCYTESUR NEGATIVE 06/29/2016 0952     Microbiology No results found for this or any previous visit (from the past 240 hour(s)).     Inpatient Medications:   Scheduled Meds: . acetaminophen  1,000 mg Oral Q6H  . aspirin EC  81 mg Oral QHS  . atorvastatin  10 mg Oral Daily  . buPROPion  150 mg Oral BID  . [START ON 04/25/2018] enoxaparin (LOVENOX) injection  50 mg Subcutaneous Q24H  . hydrochlorothiazide  12.5 mg Oral Daily  . [START ON 04/25/2018] levothyroxine  150 mcg Oral QAC breakfast  . losartan  100 mg Oral Daily  . pantoprazole  40 mg Oral Daily   Continuous Infusions: . lactated ringers       Radiological Exams on Admission: No results found.  Impression/Recommendations Active Problems:   Gallbladder sludge   Ventricular tachyarrhythmia (HCC)   1. Ventricular tachycardia in the perioperative setting-currently in sinus rhythm.  Appreciate cardiology recommendations with close monitoring in ICU overnight and recommendations for amiodarone drip should he have further arrhythmias.  Continue to monitor electrolytes in a.m.  Patient is otherwise currently asymptomatic and has had recent cath on 02/2018 with nonobstructive CAD. 2. Elective laparoscopic cholecystectomy for biliary colic.  Currently asymptomatic in this regard.  Further recommendations per general surgery. 3. Mild nonobstructive CAD.  Continue aspirin and statin therapy. 4. Hypertension-currently well controlled.  Continue losartan and HCTZ. 5. Hypothyroidism.  Continue  Synthroid. 6. Dyslipidemia.  Continue statin. 7. GERD.  Continue PPI.  Lovenox for DVT prophylaxis.  Thank you for this consultation.  Our [facility] hospitalist team will follow the patient with you.  Time Spent: 35 minutes  Baljit Liebert D Trew Sunde DO [facility] 04/24/2018, 1:46 PM

## 2018-04-24 NOTE — Progress Notes (Signed)
Appreciate cardiology input.  Patient will be admitted to ICU for further cardiac monitoring.  [facility] will be contacted.

## 2018-04-24 NOTE — Consult Note (Addendum)
Cardiology Consult    Patient ID: Seth Savage; [id number]; 1951/11/15   Admit date: 04/24/2018 Date of Consult: 04/24/2018  Primary Care Provider: Glenda Chroman, MD Primary Cardiologist: Kate Sable, MD   Patient Profile    Seth Savage is a 67 y.o. male with past medical history of carotid artery stenosis (s/p bilateral CEA), HTN, HLD, GERD, and COPD who is being seen today for the evaluation of VT at the request of Dr. Constance Haw.   History of Present Illness    Seth Savage was recently admitted from 4/23 - 03/13/2018 for evaluation of chest pain occurring for the week prior to admission which was occurring with exertion. Cyclic troponin values remained negative and EKG showed no acute ischemic changes but given his concerning symptoms, he was transferred to [facility] for a cardiac catheterization. Cath at that time showed mild nonobstructive CAD as outlined below with continued medical management recommended.   He continued to have episodes of right upper quadrant pain epigastric pain which prompted his PCP to refer him to general surgery.  He presented today for a laparoscopic cholecystectomy. Near the conclusion of the procedure, he went into a wide-complex tachycardia which was thought to be most consistent with VT.  Around that timeframe he did receive neostigmine. Chest compressions were started and he went back into a normal sinus rhythm and did not require DCCV or the administration of medications. He has been followed on telemetry in the postoperative setting and has had intermittent episodes of a wide complex QRS but no recurrent episodes of VT. Initial troponin value was negative.  In talking with the patient in PACU, he reports having episodes of palpitations over the past several months.  When this occurs, he rubs his sternal region and the symptoms resolved. No history of documented arrhythmias prior to this admission. He is having some postsurgical discomfort at this time  but denies any sternal chest pressure.   Past Medical History:  Diagnosis Date  . Arthritis    "wrists, thumb on left hand" (03/12/2018)  . Back injury 2003   "broke my back in 2 places; never had any OR for it" (03/12/2018)  . Carotid artery occlusion   . Childhood asthma   . COPD (chronic obstructive pulmonary disease) ([place])   . Essential hypertension   . GERD (gastroesophageal reflux disease)   . Goiter    Radioactive iodine treatment  . H pylori ulcer Jan 2016   Treated with Prevpac  . History of bronchitis   . History of kidney stones   . Hypercholesterolemia   . Hypertension   . Hypothyroid   . Migraine    "been about 2 yr w/out a headache" (03/12/2018)  . Neuropathy   . Pneumonia 1969  . Shortness of breath dyspnea    with exertion  . Wears glasses     Past Surgical History:  Procedure Laterality Date  . BIOPSY  05/01/2017   Procedure: BIOPSY;  Surgeon: Daneil Dolin, MD;  [facility];  Service: Endoscopy;;  gastric  . CARDIAC CATHETERIZATION N/A 07/05/2016   Procedure: Left Heart Cath and Coronary Angiography;  Surgeon: Burnell Blanks, MD;  Location: [place] CV LAB;  Service: Cardiovascular;  Laterality: N/A;  . CARDIAC CATHETERIZATION  03/12/2018  . CAROTID ENDARTERECTOMY Left ?2009  . CARPAL TUNNEL RELEASE Right   . COLECTOMY  2000   Secondary to intussuception  . COLONOSCOPY  08/24/2004   Normal rectum,Small polyp at 25 cm, cold  snared/ The remainder of the colonic mucosa appeared normal  . COLONOSCOPY  09/24/2012   Dr. Mike Craze hemorrhoids-likely source of hematochezia.Colonic diverticulosis  . COLONOSCOPY N/A 05/01/2017   Dr. Gala Romney: Diverticulosis, next colonoscopy in 5 years.  Marland Kitchen ENDARTERECTOMY Right 07/07/2016   Procedure: ENDARTERECTOMY CAROTID;  Surgeon: Rosetta Posner, MD;  Location: [facility] OR;  Service: Vascular;  Laterality: Right;  . ESOPHAGOGASTRODUODENOSCOPY N/A 12/09/2014   Dr. Gala Romney: Erosive reflux esophagitis. Peptic Ulcer  disease secondary to H.pylori. small hiatal hernia. Nonbleeding duodenal AVM. Duodenal erosions. TREATED WITH PREVPAC  . ESOPHAGOGASTRODUODENOSCOPY N/A 03/15/2015   Dr. Gala Romney: small hiatal hernia, healed PUD, duodenal AVM.   Marland Kitchen ESOPHAGOGASTRODUODENOSCOPY N/A 05/01/2017   Dr. Gala Romney: LA grade a esophagitis, gastritis, no H pylori  . FRACTURE SURGERY    . LEFT HEART CATH AND CORONARY ANGIOGRAPHY N/A 03/12/2018   Procedure: LEFT HEART CATH AND CORONARY ANGIOGRAPHY;  Surgeon: Burnell Blanks, MD;  Location: [place] CV LAB;  Service: Cardiovascular;  Laterality: N/A;  . Multiple bilateral arm/wrist surgeries after falls    . ORIF SHOULDER FRACTURE Left 06/2002   "shattered; put a bunch of metal plates in it"  . PATCH ANGIOPLASTY Right 07/07/2016   Procedure: PATCH ANGIOPLASTY USING 0.8CM X 7.6CM HEMASHIELD PATCH;  Surgeon: Rosetta Posner, MD;  Location: [place];  Service: Vascular;  Laterality: Right;  . SHOULDER ARTHROSCOPY W/ ROTATOR CUFF REPAIR Right 1999  . SHOULDER OPEN ROTATOR CUFF REPAIR Left 1992   Metal implant      Home Medications:  Prior to Admission medications   Medication Sig Start Date End Date Taking? Authorizing Provider  aspirin EC 81 MG tablet Take 81 mg by mouth at bedtime.    Yes [provider]  atorvastatin (LIPITOR) 10 MG tablet Take 1 tablet (10 mg total) by mouth daily. 07/07/16  Yes Alvia Grove, PA-C  buPROPion (WELLBUTRIN SR) 150 MG 12 hr tablet Take 150 mg by mouth 2 (two) times daily.  07/30/17  Yes [provider]  fluticasone (FLONASE) 50 MCG/ACT nasal spray Place 2 sprays into both nostrils daily as needed for allergies.    Yes [provider]  hydrochlorothiazide (HYDRODIURIL) 12.5 MG tablet Take 12.5 mg by mouth daily.  06/30/16  Yes [provider]  levothyroxine (SYNTHROID, LEVOTHROID) 150 MCG tablet Take 150 mcg by mouth daily before breakfast.    Yes [provider]  losartan (COZAAR) 100 MG tablet Take  100 mg by mouth daily.  08/28/12  Yes [provider]  pantoprazole (PROTONIX) 40 MG tablet Take 1 tablet (40 mg total) by mouth daily. 09/06/15  Yes Mahala Menghini, PA-C  dicyclomine (BENTYL) 20 MG tablet Take 1 tablet (20 mg total) by mouth 2 (two) times daily. 06/07/16 06/07/16  Comer Locket, PA-C    Inpatient Medications: Scheduled Meds: . Chlorhexidine Gluconate Cloth  6 each Topical Once   And  . Chlorhexidine Gluconate Cloth  6 each Topical Once   Continuous Infusions: . lactated ringers 50 mL/hr at 04/24/18 0827   PRN Meds: HYDROcodone-acetaminophen, HYDROmorphone (DILAUDID) injection, meperidine (DEMEROL) injection, ondansetron (ZOFRAN) IV  Allergies:    Allergies  Allergen Reactions  . Lyrica [Pregabalin] Shortness Of Breath  . Celecoxib Other (See Comments)    GI BLEED, FATIGUE  . Codeine Itching    Social History:   Social History   Socioeconomic History  . Marital status: Married    Spouse name: [name]  . Number of children: 0  . Years of education: Ged  .  Highest education level: Not on file  Occupational History    Comment: Disabled  Social Needs  . Financial resource strain: Not on file  . Food insecurity:    Worry: Not on file    Inability: Not on file  . Transportation needs:    Medical: Not on file    Non-medical: Not on file  Tobacco Use  . Smoking status: Former Smoker    Packs/day: 0.20    Years: 45.00    Pack years: 9.00    Types: Cigarettes    Last attempt to quit: 12/21/2017    Years since quitting: 0.3  . Smokeless tobacco: Never Used  Substance and Sexual Activity  . Alcohol use: Yes    Alcohol/week: 0.0 oz    Comment: stopped 08/2018  . Drug use: No  . Sexual activity: Not Currently  Lifestyle  . Physical activity:    Days per week: Not on file    Minutes per session: Not on file  . Stress: Not on file  Relationships  . Social connections:    Talks on phone: Not on file    Gets together: Not on file    Attends  religious service: Not on file    Active member of club or organization: Not on file    Attends meetings of clubs or organizations: Not on file    Relationship status: Not on file  . Intimate partner violence:    Fear of current or ex partner: Not on file    Emotionally abused: Not on file    Physically abused: Not on file    Forced sexual activity: Not on file  Other Topics Concern  . Not on file  Social History Narrative   Patient lives at home with his wife. [facility]    Disabled.   Right handed.   Caffeine- two cups daily.   Three step children.     Family History:    Family History  Problem Relation Age of Onset  . COPD Mother   . Mental illness Mother   . Hypertension Mother   . Varicose Veins Mother   . Heart attack Mother 27  . Hypertension Father   . Alcohol abuse Father   . Colon cancer Neg Hx       Review of Systems    General:  No chills, fever, night sweats or weight changes.  Cardiovascular:  No chest pain, dyspnea on exertion, edema, orthopnea,  paroxysmal nocturnal dyspnea. Positive for palpitations.  Dermatological: No rash, lesions/masses Respiratory: No cough, dyspnea Urologic: No hematuria, dysuria Abdominal:   No nausea, vomiting, diarrhea, bright red blood per rectum, melena, or hematemesis Neurologic:  No visual changes, wkns, changes in mental status.  All other systems reviewed and are otherwise negative except as noted above.  Physical Exam/Data    Vitals:   04/24/18 1100 04/24/18 1115 04/24/18 1125 04/24/18 1130  BP: (!) 162/73 (!) 156/81  (!) 173/88  Pulse: 75 79 74 75  Resp: 16 17 14 13   Temp:      TempSrc:      SpO2: 100% 100% 100% 100%    Intake/Output Summary (Last 24 hours) at 04/24/2018 1148 Last data filed at 04/24/2018 1038 Gross per 24 hour  Intake 1000 ml  Output 20 ml  Net 980 ml   There were no vitals filed for this visit. There is no height or weight on file to calculate BMI.   General: Pleasant, Caucasian male  appearing in  NAD Psych: Normal  affect. Neuro: Alert and oriented X 3. Moves all extremities spontaneously. HEENT: Normal  Neck: Supple without bruits or JVD. Lungs:  Resp regular and unlabored, CTA without wheezing or rales. Heart: RRR no s3, s4, or murmurs. Abdomen: Soft, non-tender, non-distended, BS + x 4.  Extremities: No clubbing, cyanosis or edema. DP/PT/Radials 2+ and equal bilaterally.   EKG:  The EKG was personally reviewed and demonstrates:  NSR, HR 65, with isolated TWI along V1-V2    Labs/Studies     Relevant CV Studies:  Cardiac Catheterization: 03/12/2018  Prox RCA lesion is 30% stenosed.  Post Atrio lesion is 40% stenosed.  Ost RPDA lesion is 20% stenosed.  Ost 1st Mrg lesion is 30% stenosed.  Ost 2nd Mrg lesion is 30% stenosed.  Ost Cx to Prox Cx lesion is 20% stenosed.  Ost LAD to Prox LAD lesion is 30% stenosed.  The left ventricular systolic function is normal.  LV end diastolic pressure is normal.  The left ventricular ejection fraction is 55-65% by visual estimate.  There is no mitral valve regurgitation.   1. Mild non-obstructive CAD 2. Normal LV systolic function  Recommendations: consider addition of long acting nitrate. Consider non-cardiac causes of chest pain. Continue medical management of mild CAD  Laboratory Data:  Chemistry Recent Labs  Lab 04/19/18 1434 04/24/18 1047  NA 140 136  K 3.8 4.1  CL 105 101  CO2 26 28  GLUCOSE 106* 164*  BUN 26* 23*  CREATININE 1.31* 1.24  CALCIUM 9.4 8.9  GFRNONAA 56* 59*  GFRAA >60 >60  ANIONGAP 9 7    Recent Labs  Lab 04/19/18 1434  PROT 7.5  ALBUMIN 4.4  AST 15  ALT 24  ALKPHOS 95  BILITOT 0.8   Hematology Recent Labs  Lab 04/19/18 1434 04/24/18 1047  WBC 8.2 6.5  RBC 5.07 4.80  HGB 15.0 14.1  HCT 43.7 42.3  MCV 86.2 88.1  MCH 29.6 29.4  MCHC 34.3 33.3  RDW 13.3 13.3  PLT 201 170   Cardiac Enzymes Recent Labs  Lab 04/24/18 1047  TROPONINI <0.03   No  results for input(s): TROPIPOC in the last 168 hours.  BNPNo results for input(s): BNP, PROBNP in the last 168 hours.  DDimer No results for input(s): DDIMER in the last 168 hours.  Radiology/Studies:  No results found.  Assessment & Plan    1. Ventricular Tachycardia occurring in the Post-Operative Setting - Patient presented today for planned laparoscopic cholecystectomy and at the conclusion of the case he was noted to have gone into a wide-complex tachycardia which was most consistent with VT. He converted back to normal sinus rhythm with initiation of chest compressions and did not require DCCV or administration of epinephrine. - He does report intermittent palpitations over the past several months which he says improves when he presses on his chest. - Discussed with Dr. Bronson Ing. Would monitor in ICU today and overnight. If he has recurrent events, would start Amiodarone drip. If no recurrent arrhythmias this admission, would recommend a 30-day event monitor as an outpatient to monitor for recurrent episodes of VT given his history of palpitations. Keep Mg ~ 2.0 and K+ ~ 4.0. No plans for repeat cath at this time given his recent cath in 02/2018 which showed nonobstructive CAD.   2. CAD - Mild nonobstructive CAD by recent catheterization in 02/2018. - continue PTA ASA and statin therapy.   3. HTN - on Losartan 100mg  daily and HCTZ 12.5mg  daily as an outpatient. Resume  as indicated.     For questions or updates, please contact [place] Please consult [email or web address] for contact info under Cardiology/STEMI.  Signed, Erma Heritage, PA-C 04/24/2018, 11:48 AM Pager: [phone]  The patient was seen and examined, and I agree with the history, physical exam, assessment and plan as documented above, with modifications as noted below. I have also personally reviewed all relevant documentation, old records, labs, and both radiographic and cardiovascular studies. I have also  independently interpreted old and new ECG's.  I was asked by Dr. Arnoldo Morale to evaluate this patient.  Briefly, he is a 67 year old male with a past medical history of bilateral carotid endarterectomies, hypertension, hyperlipidemia, COPD, and GERD, who developed postoperative ventricular tachycardia after a laparoscopic cholecystectomy.  It occurred after receiving reversal for neostigmine.  I personally reviewed all ECGs and telemetry strips.   He does admit to intermittent palpitations which have been going on for at least 2 years. He was given chest compressions and he promptly return to sinus rhythm.  Since being followed on telemetry, he has had brief episodes of a wide-complex rhythm but no recurrence of ventricular tachycardia thus far.  Initial troponin was normal.  He was recently evaluated for chest pain in April and underwent coronary angiography which demonstrated nonobstructive disease.  Left ventricular systolic function appeared to be grossly normal, LVEF 55-65% by visual estimate.  Recommendations: As he had nonobstructive coronary artery disease and grossly normal left ventricular systolic function, this does not appear to be mediated by ischemia. There is documentation in the literature of ventricular tachycardia occurring after given reversal agents for neostigmine which also occurred in the setting. I recommend he be hospitalized with telemetry monitoring in the ICU overnight.    If he does have recurrent ventricular tachycardia I would recommend starting intravenous amiodarone bolus and infusion.  If he does not have recurrent ventricular tachycardia, I would consider discharging him on a beta-blocker.   He will require a 30-day event monitor.  I would aim to keep magnesium greater than 2 and potassium greater than 4 to increase arrhythmic threshold.   Kate Sable, MD, [facility]  04/24/2018 2:32 PM

## 2018-04-25 ENCOUNTER — Encounter (HOSPITAL_COMMUNITY): Payer: Self-pay | Admitting: General Surgery

## 2018-04-25 ENCOUNTER — Telehealth: Payer: Self-pay | Admitting: *Deleted

## 2018-04-25 DIAGNOSIS — Z9049 Acquired absence of other specified parts of digestive tract: Secondary | ICD-10-CM | POA: Diagnosis not present

## 2018-04-25 DIAGNOSIS — I472 Ventricular tachycardia, unspecified: Secondary | ICD-10-CM

## 2018-04-25 DIAGNOSIS — K8050 Calculus of bile duct without cholangitis or cholecystitis without obstruction: Secondary | ICD-10-CM | POA: Diagnosis present

## 2018-04-25 DIAGNOSIS — K8010 Calculus of gallbladder with chronic cholecystitis without obstruction: Secondary | ICD-10-CM | POA: Diagnosis not present

## 2018-04-25 DIAGNOSIS — R002 Palpitations: Secondary | ICD-10-CM

## 2018-04-25 DIAGNOSIS — I2510 Atherosclerotic heart disease of native coronary artery without angina pectoris: Secondary | ICD-10-CM | POA: Diagnosis not present

## 2018-04-25 DIAGNOSIS — I10 Essential (primary) hypertension: Secondary | ICD-10-CM | POA: Diagnosis not present

## 2018-04-25 LAB — COMPREHENSIVE METABOLIC PANEL
ALBUMIN: 3.5 g/dL (ref 3.5–5.0)
ALT: 50 U/L (ref 17–63)
ANION GAP: 6 (ref 5–15)
AST: 37 U/L (ref 15–41)
Alkaline Phosphatase: 91 U/L (ref 38–126)
BUN: 16 mg/dL (ref 6–20)
CO2: 30 mmol/L (ref 22–32)
Calcium: 8.8 mg/dL — ABNORMAL LOW (ref 8.9–10.3)
Chloride: 101 mmol/L (ref 101–111)
Creatinine, Ser: 1.14 mg/dL (ref 0.61–1.24)
GFR calc Af Amer: >60 mL/min (ref >60)
GFR calc non Af Amer: >60 mL/min (ref >60)
GLUCOSE: 115 mg/dL — AB (ref 65–99)
Potassium: 4.2 mmol/L (ref 3.5–5.1)
SODIUM: 137 mmol/L (ref 135–145)
Total Bilirubin: 0.9 mg/dL (ref 0.3–1.2)
Total Protein: 6.2 g/dL — ABNORMAL LOW (ref 6.5–8.1)

## 2018-04-25 LAB — MAGNESIUM: Magnesium: 1.9 mg/dL (ref 1.7–2.4)

## 2018-04-25 LAB — CBC
HCT: 41.7 % (ref 39.0–52.0)
Hemoglobin: 13.6 g/dL (ref 13.0–17.0)
MCH: 29 pg (ref 26.0–34.0)
MCHC: 32.6 g/dL (ref 30.0–36.0)
MCV: 88.9 fL (ref 78.0–100.0)
Platelets: 167 K/uL (ref 150–400)
RBC: 4.69 MIL/uL (ref 4.22–5.81)
RDW: 13.4 % (ref 11.5–15.5)
WBC: 8.9 K/uL (ref 4.0–10.5)

## 2018-04-25 LAB — PHOSPHORUS: Phosphorus: 3.9 mg/dL (ref 2.5–4.6)

## 2018-04-25 MED ORDER — TRAMADOL HCL 50 MG PO TABS
50.0000 mg | ORAL_TABLET | Freq: Four times a day (QID) | ORAL | 0 refills | Status: DC | PRN
Start: 2018-04-25 — End: 2018-05-07

## 2018-04-25 MED ORDER — METOPROLOL SUCCINATE ER 25 MG PO TB24
25.0000 mg | ORAL_TABLET | Freq: Every day | ORAL | Status: DC
  Administered 2018-04-25: 25 mg via ORAL
  Filled 2018-04-25: qty 1

## 2018-04-25 MED ORDER — METOPROLOL SUCCINATE ER 25 MG PO TB24: 25.0000 mg | ORAL_TABLET | Freq: Every day | ORAL | 0 refills | Status: DC

## 2018-04-25 MED FILL — Medication: Qty: 1 | Status: CN

## 2018-04-25 NOTE — Progress Notes (Addendum)
Progress Note  Patient Name: Seth Savage Date of Encounter: 04/25/2018  Primary Cardiologist: Dr. Kate Sable  Subjective   No chest pain or palpitations, no shortness of breath.  No nausea or emesis.  Inpatient Medications    Scheduled Meds: . acetaminophen  1,000 mg Oral Q6H  . aspirin EC  81 mg Oral QHS  . atorvastatin  10 mg Oral Daily  . buPROPion  150 mg Oral BID  . enoxaparin (LOVENOX) injection  50 mg Subcutaneous Q24H  . hydrochlorothiazide  12.5 mg Oral Daily  . levothyroxine  150 mcg Oral QAC breakfast  . losartan  100 mg Oral Daily  . pantoprazole  40 mg Oral Daily   Continuous Infusions: . lactated ringers 50 mL/hr at 04/24/18 2132   PRN Meds: fluticasone, HYDROmorphone (DILAUDID) injection, ondansetron **OR** ondansetron (ZOFRAN) IV, simethicone, traMADol   Vital Signs    Vitals:   04/25/18 0430 04/25/18 0500 04/25/18 0530 04/25/18 0600  BP: (!) 146/80 (!) 119/59 112/68 132/73  Pulse: 69 60 68 62  Resp: 16 14 16 13   Temp:      TempSrc:      SpO2: 98% 95% 96% 97%  Weight:  221 lb 9 oz (100.5 kg)      Intake/Output Summary (Last 24 hours) at 04/25/2018 0856 Last data filed at 04/24/2018 1800 Gross per 24 hour  Intake 1851.67 ml  Output 20 ml  Net 1831.67 ml   Filed Weights   04/25/18 0500  Weight: 221 lb 9 oz (100.5 kg)    Telemetry    Sinus rhythm with rare PVCs.  QRS goes from normal to IVCD pattern.  Episodes of tachycardia look to be long RP tachycardia, possibly an atrial tachycardia but not clearly VT.  Personally reviewed.  ECG    Tracing from 04/24/2018 shows sinus rhythm with nonspecific T wave changes.  Personally reviewed.  Physical Exam   GEN: No acute distress.   Neck: No JVD. Cardiac: RRR, no gallop.  Respiratory: Nonlabored. Clear to auscultation bilaterally. GI: Soft, incisions intact, bowel sounds present. MS: No edema; No deformity. Neuro:  Nonfocal. Psych: Alert and oriented x 3. Normal affect.  Labs      Chemistry Recent Labs  Lab 04/19/18 1434 04/24/18 1047 04/25/18 0430  NA 140 136 137  K 3.8 4.1 4.2  CL 105 101 101  CO2 26 28 30   GLUCOSE 106* 164* 115*  BUN 26* 23* 16  CREATININE 1.31* 1.24 1.14  CALCIUM 9.4 8.9 8.8*  PROT 7.5  --  6.2*  ALBUMIN 4.4  --  3.5  AST 15  --  37  ALT 24  --  50  ALKPHOS 95  --  91  BILITOT 0.8  --  0.9  GFRNONAA 56* 59* >60  GFRAA >60 >60 >60  ANIONGAP 9 7 6      Hematology Recent Labs  Lab 04/19/18 1434 04/24/18 1047 04/25/18 0430  WBC 8.2 6.5 8.9  RBC 5.07 4.80 4.69  HGB 15.0 14.1 13.6  HCT 43.7 42.3 41.7  MCV 86.2 88.1 88.9  MCH 29.6 29.4 29.0  MCHC 34.3 33.3 32.6  RDW 13.3 13.3 13.4  PLT 201 170 167    Cardiac Enzymes Recent Labs  Lab 04/24/18 1047 04/24/18 1643 04/24/18 2316  TROPONINI <0.03 <0.03 <0.03   No results for input(s): TROPIPOC in the last 168 hours.    Radiology    No results found.  Cardiac Studies   Cardiac catheterization 03/12/2018:  Prox RCA lesion  is 30% stenosed.  Post Atrio lesion is 40% stenosed.  Ost RPDA lesion is 20% stenosed.  Ost 1st Mrg lesion is 30% stenosed.  Ost 2nd Mrg lesion is 30% stenosed.  Ost Cx to Prox Cx lesion is 20% stenosed.  Ost LAD to Prox LAD lesion is 30% stenosed.  The left ventricular systolic function is normal.  LV end diastolic pressure is normal.  The left ventricular ejection fraction is 55-65% by visual estimate.  There is no mitral valve regurgitation.   1. Mild non-obstructive CAD 2. Normal LV systolic function  Patient Profile     67 y.o. male with a history of carotid artery disease status post bilateral CEA, hypertension, hyperlipidemia, GERD, COPD, and recently documented mild nonobstructive CAD at cardiac catheterization in April.  He is status post laparoscopic cholecystectomy on June 5, admitted for observation after documented wide-complex tachycardia.  Assessment & Plan    1.  Intermittent wide-complex tachycardia.  Telemetry  review shows a long RP tachycardia with IVCD that does not look to be ventricular tachycardia on my review today of overnight strips.  What he had yesterday is consistent with VT, no recurrent events similar to this noted. He reports a long-standing history of intermittent palpitations, usually in the evenings, no syncope.  Interestingly, his QRS goes from normal to IVCD and a left bundle branch block pattern at times.  He may be having intermittent atrial tachycardia with IVCD as well.  Troponin I levels are negative, and he underwent a recent cardiac catheterization showing only mild nonobstructive CAD with normal LVEF.  2.  Status post laparoscopic cholecystectomy on June 5.  3.  Essential hypertension, blood pressure stable.  He is on Cozaar and HCTZ.  4.  Hyperlipidemia, on Lipitor.  Discussed with patient.  No further inpatient cardiac testing is planned at this time.  He is being started on Toprol-XL 25 mg daily and will be set up for an outpatient 30-day event recorder to further evaluate cardiac arrhythmia.  He will also need a follow-up outpatient echocardiogram and visit to review his testing with Dr. Bronson Ing.  Signed, Rozann Lesches, MD  04/25/2018, 8:56 AM

## 2018-04-25 NOTE — Discharge Summary (Signed)
Physician Discharge Summary  Patient ID: Seth Savage MRN: [id number] DOB/AGE: [birth year] 67 y.o.  Admit date: 04/24/2018 Discharge date: 04/25/2018  Admission Diagnoses: Biliary colic, biliary sludge  Discharge Diagnoses: Same, ventricular tachyarrhythmia Active Problems:   Gallbladder sludge   Ventricular tachyarrhythmia (HCC)   VT (ventricular tachycardia) (HCC)   Coronary artery disease due to lipid rich plaque   S/P laparoscopic cholecystectomy   Discharged Condition: good  Hospital Course: Patient is a 67 year old white male who underwent a laparoscopic cholecystectomy on 04/24/2018.  At the end of the procedure, he had an episode of ventricular tachycardia at that responded with chest compressions.  He was admitted to the hospital for observation.  Cardiology was consulted.  The following day, cardiology felt that it was safe for him to be discharged.  He was instructed to follow-up with them concerning his ventricular tachycardia.  He remained stable throughout his stay.  He was started on Toprol-XL 25 mg daily.  He is being discharged home in good and improving condition.  Treatments: surgery: Laparoscopic cholecystectomy on 04/24/2018  Discharge Exam: Blood pressure (!) 103/57, pulse 74, temperature 97.9 F (36.6 C), temperature source Oral, resp. rate 16, weight 221 lb 9 oz (100.5 kg), SpO2 98 %. General appearance: alert, cooperative and no distress Resp: clear to auscultation bilaterally Cardio: regular rate and rhythm, S1, S2 normal, no murmur, click, rub or gallop GI: Soft, incisions healing well.  Disposition: home   Allergies as of 04/25/2018      Reactions   Lyrica [pregabalin] Shortness Of Breath   Celecoxib Other (See Comments)   GI BLEED, FATIGUE   Codeine Itching      Medication List    TAKE these medications   aspirin EC 81 MG tablet Take 81 mg by mouth at bedtime.   atorvastatin 10 MG tablet Commonly known as:  LIPITOR Take 1 tablet (10 mg total) by  mouth daily.   buPROPion 150 MG 12 hr tablet Commonly known as:  WELLBUTRIN SR Take 150 mg by mouth 2 (two) times daily.   fluticasone 50 MCG/ACT nasal spray Commonly known as:  FLONASE Place 2 sprays into both nostrils daily as needed for allergies.   hydrochlorothiazide 12.5 MG tablet Commonly known as:  HYDRODIURIL Take 12.5 mg by mouth daily.   levothyroxine 150 MCG tablet Commonly known as:  SYNTHROID, LEVOTHROID Take 150 mcg by mouth daily before breakfast.   losartan 100 MG tablet Commonly known as:  COZAAR Take 100 mg by mouth daily.   metoprolol succinate 25 MG 24 hr tablet Commonly known as:  TOPROL XL Take 1 tablet (25 mg total) by mouth daily.   pantoprazole 40 MG tablet Commonly known as:  PROTONIX Take 1 tablet (40 mg total) by mouth daily.   traMADol 50 MG tablet Commonly known as:  ULTRAM Take 1 tablet (50 mg total) by mouth every 6 (six) hours as needed.      Follow-up Information    Aviva Signs, MD. Schedule an appointment as soon as possible for a visit on 05/07/2018.   Specialty:  General Surgery Contact information: [address] [place] [address] [phone]        Herminio Commons, MD. Schedule an appointment as soon as possible for a visit.   Specialty:  Cardiology Why:  Call his office to arrange follow up appt. Contact information: [place] [address] [phone]           Signed: Aviva Signs 04/25/2018, 9:55 AM

## 2018-04-25 NOTE — Discharge Instructions (Signed)
Laparoscopic Cholecystectomy, Care After °This sheet gives you information about how to care for yourself after your procedure. Your health care provider may also give you more specific instructions. If you have problems or questions, contact your health care provider. °What can I expect after the procedure? °After the procedure, it is common to have: °· Pain at your incision sites. You will be given medicines to control this pain. °· Mild nausea or vomiting. °· Bloating and possible shoulder pain from the air-like gas that was used during the procedure. ° °Follow these instructions at home: °Incision care ° °· Follow instructions from your health care provider about how to take care of your incisions. Make sure you: °? Wash your hands with soap and water before you change your bandage (dressing). If soap and water are not available, use hand sanitizer. °? Change your dressing as told by your health care provider. °? Leave stitches (sutures), skin glue, or adhesive strips in place. These skin closures may need to be in place for 2 weeks or longer. If adhesive strip edges start to loosen and curl up, you may trim the loose edges. Do not remove adhesive strips completely unless your health care provider tells you to do that. °· Do not take baths, swim, or use a hot tub until your health care provider approves. Ask your health care provider if you can take showers. You may only be allowed to take sponge baths for bathing. °· Check your incision area every day for signs of infection. Check for: °? More redness, swelling, or pain. °? More fluid or blood. °? Warmth. °? Pus or a bad smell. °Activity °· Do not drive or use heavy machinery while taking prescription pain medicine. °· Do not lift anything that is heavier than 10 lb (4.5 kg) until your health care provider approves. °· Do not play contact sports until your health care provider approves. °· Do not drive for 24 hours if you were given a medicine to help you relax  (sedative). °· Rest as needed. Do not return to work or school until your health care provider approves. °General instructions °· Take over-the-counter and prescription medicines only as told by your health care provider. °· To prevent or treat constipation while you are taking prescription pain medicine, your health care provider may recommend that you: °? Drink enough fluid to keep your urine clear or pale yellow. °? Take over-the-counter or prescription medicines. °? Eat foods that are high in fiber, such as fresh fruits and vegetables, whole grains, and beans. °? Limit foods that are high in fat and processed sugars, such as fried and sweet foods. °Contact a health care provider if: °· You develop a rash. °· You have more redness, swelling, or pain around your incisions. °· You have more fluid or blood coming from your incisions. °· Your incisions feel warm to the touch. °· You have pus or a bad smell coming from your incisions. °· You have a fever. °· One or more of your incisions breaks open. °Get help right away if: °· You have trouble breathing. °· You have chest pain. °· You have increasing pain in your shoulders. °· You faint or feel dizzy when you stand. °· You have severe pain in your abdomen. °· You have nausea or vomiting that lasts for more than one day. °· You have leg pain. °This information is not intended to replace advice given to you by your health care provider. Make sure you discuss any questions you   have with your health care provider. °Document Released: 11/06/2005 Document Revised: 05/27/2016 Document Reviewed: 04/24/2016 °Elsevier Interactive Patient Education © 2018 Elsevier Inc. ° °

## 2018-04-25 NOTE — Telephone Encounter (Signed)
-----   Message from Erma Heritage, [place] sent at 04/25/2018 11:27 AM EDT ----- Regarding: Outpatient Testing Seth Savage,   Dr. Domenic Polite saw this patient today while rounding and recommended he have an echocardiogram and 30-day event monitor as an outpatient. Both are for Ventricular Tachycardia and Palpitations. Patient of Dr. Bronson Savage (should be ordered under him to review). He already has follow-up scheduled for next month.   Thank you for your help!  - [place]

## 2018-04-25 NOTE — Progress Notes (Signed)
PROGRESS NOTE    Seth Savage  XBM:[id number] DOB: Nov 01, 1951 DOA: 04/24/2018 PCP: Ignatius Specking, MD   Brief Narrative:   Seth Savage is an 67 y.o. male with history of hypertension, hypothyroidism, dyslipidemia, GERD, carotid artery stenosis status post bilateral CEA as well as nonobstructive CAD with recent catheterization on 02/2018.  He underwent planned laparoscopic cholecystectomy on 6/5 after he was referred for biliary colic secondary to biliary sludge.  He postoperatively went into a wide-complex tachycardia consistent with ventricular tachyarrhythmia and received some chest compressions and reverted back to normal sinus rhythm.  He has been seen by cardiology and was not noted to have any further ventricular tachyarrhythmias overnight.  He is doing well this morning and has been recommended to discharge home with 30-day event monitor as well as Toprol-XL 25 mg daily.   Assessment & Plan:   Active Problems:   Gallbladder sludge   Ventricular tachyarrhythmia (HCC)   VT (ventricular tachycardia) (HCC)   Coronary artery disease due to lipid rich plaque   S/P laparoscopic cholecystectomy  1. Ventricular tachycardia in the perioperative setting-currently in sinus rhythm.  Appreciate cardiology recommendations for Holter monitoring and Toprol-XL 25 mg daily.  Will have follow-up 2D echocardiogram in outpatient setting.  No further symptomatology noted. 2. Elective laparoscopic cholecystectomy for biliary colic.  Currently asymptomatic in this regard.    Plan for discharge today. 3. Mild nonobstructive CAD.  Continue aspirin and statin therapy. 4. Hypertension-currently well controlled.  Continue losartan and HCTZ. 5. Hypothyroidism.  Continue Synthroid. 6. Dyslipidemia.  Continue statin. 7. GERD.  Continue PPI.   Subjective: Patient seen and evaluated today with no new acute complaints or concerns. No acute concerns or events noted overnight.  Objective: Vitals:   04/25/18  0937 04/25/18 0938 04/25/18 1000 04/25/18 1142  BP: (!) 103/57  (!) 127/54 (!) 122/58  Pulse: 73 74 70 74  Resp:  16 13 14   Temp:    98.1 F (36.7 C)  TempSrc:    Oral  SpO2:  98% 96%   Weight:    100.5 kg (221 lb 9 oz)  Height:    6' (1.829 m)    Intake/Output Summary (Last 24 hours) at 04/25/2018 1148 Last data filed at 04/24/2018 1800 Gross per 24 hour  Intake 851.67 ml  Output -  Net 851.67 ml   Filed Weights   04/25/18 0500 04/25/18 1142  Weight: 100.5 kg (221 lb 9 oz) 100.5 kg (221 lb 9 oz)    Examination:  General exam: Appears calm and comfortable  Respiratory system: Clear to auscultation. Respiratory effort normal. Cardiovascular system: S1 & S2 heard, RRR. No JVD, murmurs, rubs, gallops or clicks. No pedal edema. Gastrointestinal system: Abdomen is nondistended, soft and nontender. No organomegaly or masses felt. Normal bowel sounds heard. Central nervous system: Alert and oriented. No focal neurological deficits. Extremities: Symmetric 5 x 5 power. Skin: No rashes, lesions or ulcers Psychiatry: Judgement and insight appear normal. Mood & affect appropriate.     Data Reviewed: I have personally reviewed following labs and imaging studies  CBC: Recent Labs  Lab 04/19/18 1434 04/24/18 1047 04/25/18 0430  WBC 8.2 6.5 8.9  NEUTROABS 6.1  --   --   HGB 15.0 14.1 13.6  HCT 43.7 42.3 41.7  MCV 86.2 88.1 88.9  PLT 201 170 167   Basic Metabolic Panel: Recent Labs  Lab 04/19/18 1434 04/24/18 1047 04/25/18 0430  NA 140 136 137  K 3.8 4.1 4.2  CL  105 101 101  CO2 26 28 30   GLUCOSE 106* 164* 115*  BUN 26* 23* 16  CREATININE 1.31* 1.24 1.14  CALCIUM 9.4 8.9 8.8*  MG  --   --  1.9  PHOS  --   --  3.9   GFR: Estimated Creatinine Clearance: 79.3 mL/min (by C-G formula based on SCr of 1.14 mg/dL). Liver Function Tests: Recent Labs  Lab 04/19/18 1434 04/25/18 0430  AST 15 37  ALT 24 50  ALKPHOS 95 91  BILITOT 0.8 0.9  PROT 7.5 6.2*  ALBUMIN 4.4 3.5    No results for input(s): LIPASE, AMYLASE in the last 168 hours. No results for input(s): AMMONIA in the last 168 hours. Coagulation Profile: No results for input(s): INR, PROTIME in the last 168 hours. Cardiac Enzymes: Recent Labs  Lab 04/24/18 1047 04/24/18 1643 04/24/18 2316  TROPONINI <0.03 <0.03 <0.03   BNP (last 3 results) No results for input(s): PROBNP in the last 8760 hours. HbA1C: No results for input(s): HGBA1C in the last 72 hours. CBG: No results for input(s): GLUCAP in the last 168 hours. Lipid Profile: No results for input(s): CHOL, HDL, LDLCALC, TRIG, CHOLHDL, LDLDIRECT in the last 72 hours. Thyroid Function Tests: No results for input(s): TSH, T4TOTAL, FREET4, T3FREE, THYROIDAB in the last 72 hours. Anemia Panel: No results for input(s): VITAMINB12, FOLATE, FERRITIN, TIBC, IRON, RETICCTPCT in the last 72 hours. Sepsis Labs: No results for input(s): PROCALCITON, LATICACIDVEN in the last 168 hours.  Recent Results (from the past 240 hour(s))  MRSA PCR Screening     Status: None   Collection Time: 04/24/18  1:25 PM  Result Value Ref Range Status   MRSA by PCR NEGATIVE NEGATIVE Final    Comment:        The GeneXpert MRSA Assay (FDA approved for NASAL specimens only), is one component of a comprehensive MRSA colonization surveillance program. It is not intended to diagnose MRSA infection nor to guide or monitor treatment for MRSA infections. Performed at [facility], [address] [place], [place] [address]          Radiology Studies: No results found.      Scheduled Meds: . acetaminophen  1,000 mg Oral Q6H  . aspirin EC  81 mg Oral QHS  . atorvastatin  10 mg Oral Daily  . buPROPion  150 mg Oral BID  . enoxaparin (LOVENOX) injection  50 mg Subcutaneous Q24H  . hydrochlorothiazide  12.5 mg Oral Daily  . levothyroxine  150 mcg Oral QAC breakfast  . losartan  100 mg Oral Daily  . metoprolol succinate  25 mg Oral Daily  . pantoprazole   40 mg Oral Daily   Continuous Infusions: . lactated ringers Stopped (04/25/18 1135)     LOS: 1 day    Time spent: 30 minutes    Anaka Beazer Hoover Brunette, DO [facility] Pager [phone]  If 7PM-7AM, please contact night-coverage [email or web address] Password [email or web address] 04/25/2018, 11:48 AM

## 2018-04-25 NOTE — Care Management Obs Status (Signed)
[place] NOTIFICATION   Patient Details  Name: Seth Savage MRN: [id number] Date of Birth: 1951/05/08   Medicare Observation Status Notification Given:  Yes    Emon Miggins, Chauncey [place], RN 04/25/2018, 12:16 PM

## 2018-04-25 NOTE — Care Management CC44 (Signed)
Condition Code 44 Documentation Completed  Patient Details  Name: Seth Savage MRN: [id number] Date of Birth: [birth year]   Condition Code 44 given:   yes Patient signature on Condition Code 44 notice:   yes Documentation of 2 MD's agreement:   yes Code 44 added to claim:   yes    Albino Bufford, Chauncey Reading, RN 04/25/2018, 12:17 PM

## 2018-05-02 DIAGNOSIS — J069 Acute upper respiratory infection, unspecified: Secondary | ICD-10-CM | POA: Insufficient documentation

## 2018-05-02 DIAGNOSIS — Z125 Encounter for screening for malignant neoplasm of prostate: Secondary | ICD-10-CM | POA: Insufficient documentation

## 2018-05-02 DIAGNOSIS — K219 Gastro-esophageal reflux disease without esophagitis: Secondary | ICD-10-CM | POA: Insufficient documentation

## 2018-05-02 DIAGNOSIS — Z8639 Personal history of other endocrine, nutritional and metabolic disease: Secondary | ICD-10-CM | POA: Insufficient documentation

## 2018-05-02 DIAGNOSIS — Z1331 Encounter for screening for depression: Secondary | ICD-10-CM | POA: Insufficient documentation

## 2018-05-02 DIAGNOSIS — Z8709 Personal history of other diseases of the respiratory system: Secondary | ICD-10-CM | POA: Insufficient documentation

## 2018-05-02 DIAGNOSIS — Z87442 Personal history of urinary calculi: Secondary | ICD-10-CM | POA: Insufficient documentation

## 2018-05-02 DIAGNOSIS — Z87891 Personal history of nicotine dependence: Secondary | ICD-10-CM | POA: Insufficient documentation

## 2018-05-02 DIAGNOSIS — E039 Hypothyroidism, unspecified: Secondary | ICD-10-CM | POA: Insufficient documentation

## 2018-05-02 DIAGNOSIS — Z8669 Personal history of other diseases of the nervous system and sense organs: Secondary | ICD-10-CM | POA: Insufficient documentation

## 2018-05-02 DIAGNOSIS — R972 Elevated prostate specific antigen [PSA]: Secondary | ICD-10-CM | POA: Insufficient documentation

## 2018-05-02 DIAGNOSIS — R5383 Other fatigue: Secondary | ICD-10-CM | POA: Insufficient documentation

## 2018-05-02 DIAGNOSIS — G43909 Migraine, unspecified, not intractable, without status migrainosus: Secondary | ICD-10-CM | POA: Insufficient documentation

## 2018-05-02 DIAGNOSIS — Z6828 Body mass index (BMI) 28.0-28.9, adult: Secondary | ICD-10-CM | POA: Insufficient documentation

## 2018-05-02 DIAGNOSIS — N200 Calculus of kidney: Secondary | ICD-10-CM | POA: Insufficient documentation

## 2018-05-02 DIAGNOSIS — J309 Allergic rhinitis, unspecified: Secondary | ICD-10-CM | POA: Insufficient documentation

## 2018-05-02 DIAGNOSIS — E7800 Pure hypercholesterolemia, unspecified: Secondary | ICD-10-CM | POA: Insufficient documentation

## 2018-05-07 ENCOUNTER — Encounter: Payer: Self-pay | Admitting: General Surgery

## 2018-05-07 ENCOUNTER — Ambulatory Visit (INDEPENDENT_AMBULATORY_CARE_PROVIDER_SITE_OTHER): Payer: Self-pay | Admitting: General Surgery

## 2018-05-07 VITALS — BP 148/76 | HR 69 | Temp 96.9°F | Resp 20 | Ht 73.0 in | Wt 222.0 lb

## 2018-05-07 DIAGNOSIS — Z09 Encounter for follow-up examination after completed treatment for conditions other than malignant neoplasm: Secondary | ICD-10-CM

## 2018-05-07 NOTE — Progress Notes (Signed)
Subjective:     Seth Savage  Status post laparoscopic cholecystectomy.  Doing well.  Has not had any further episodes of heart palpitations.  States his preoperative right upper quadrant abdominal pain is gone.  He is still having some reflux.  He denies any fever or chills. Objective:    BP (!) 148/76   Pulse 69   Temp (!) 96.9 F (36.1 C) (Oral)   Resp 20   Ht 6\' 1"  (1.854 m)   Wt 222 lb (100.7 kg)   BMI 29.29 kg/m   General:  alert, cooperative and no distress  Abdomen soft, incisions healing well.  Staples removed, Steri-Strips applied. Final pathology consistent with diagnosis.     Assessment:    Doing well postoperatively.    Plan:   May resume normal activity.  Is to follow-up with both cardiology and GI.  Follow-up here as needed.

## 2018-05-13 ENCOUNTER — Encounter: Payer: Self-pay | Admitting: Gastroenterology

## 2018-05-13 ENCOUNTER — Encounter

## 2018-05-13 ENCOUNTER — Ambulatory Visit (INDEPENDENT_AMBULATORY_CARE_PROVIDER_SITE_OTHER): Payer: Medicare Other | Admitting: Gastroenterology

## 2018-05-13 VITALS — BP 114/75 | HR 71 | Temp 97.2°F | Ht 72.0 in | Wt 222.4 lb

## 2018-05-13 DIAGNOSIS — K2100 Gastro-esophageal reflux disease with esophagitis, without bleeding: Secondary | ICD-10-CM

## 2018-05-13 DIAGNOSIS — R1013 Epigastric pain: Secondary | ICD-10-CM

## 2018-05-13 DIAGNOSIS — R195 Other fecal abnormalities: Secondary | ICD-10-CM

## 2018-05-13 NOTE — Progress Notes (Signed)
Referring Provider: Glenda Chroman, MD Primary Care Physician:  Glenda Chroman, MD  Primary GI: Dr. Gala Romney   Chief Complaint  Patient presents with  . heme + stool  . Abdominal Pain    right side, under rib, at times will have swelling. Has had gallbladder removed 04/24/18  . Chest Pain    center of breast bone, 'feels like sword is through me"    HPI:   Seth Savage is a 67 y.o. male presenting today at the request of Dr. Woody Seller due to heme positive stool. He has a chronic history of RUQ pain but now resolved after cholecystectomy. PUD in Jan 2016 secondary to H.pylori, with surveillance EGD April 2016 completely healed. Most recent EGD in June 2018 with LA Grade A esophagitis, gastritis, negative H.pylori. Colonoscopy also completed last year June 2018 with diverticulosis. History of polyps with surveillance due in 5 years. Recently had cholecystectomy June 2019.   Feels like a sword is sticking through his epigastric region. Onset March/April 2019. Wakes him up at night. Last oral intake yesterday evening around 5:15 pm. No late night eating. Protonix once daily. Taking OTC acid reducers as well. Rolaids, Tagamet to try and find relief. Only 81 mg aspirin.   Had cardiac cath in April 2019. Had issues with VT during surgery. Office visit with Dr. Bronson Ing on July 18th for follow-up. No shortness of breath.   Cardiac cath: mild non-obstructive CAD, normal LV systolic function. Recommendations for long acting nitrate and consideration for non-cardiac causes of chest pain.   Past Medical History:  Diagnosis Date  . Arthritis    "wrists, thumb on left hand" (03/12/2018)  . Back injury 2003   "broke my back in 2 places; never had any OR for it" (03/12/2018)  . Carotid artery occlusion   . Childhood asthma   . COPD (chronic obstructive pulmonary disease) ([place])   . Essential hypertension   . GERD (gastroesophageal reflux disease)   . Goiter    Radioactive iodine treatment  . H pylori  ulcer Jan 2016   Treated with Prevpac  . History of bronchitis   . History of kidney stones   . Hypercholesterolemia   . Hypertension   . Hypothyroid   . Migraine    "been about 2 yr w/out a headache" (03/12/2018)  . Neuropathy   . Pneumonia 1969  . Shortness of breath dyspnea    with exertion  . Wears glasses     Past Surgical History:  Procedure Laterality Date  . BIOPSY  05/01/2017   Procedure: BIOPSY;  Surgeon: Daneil Dolin, MD;  Location: [facility];  Service: Endoscopy;;  gastric  . CARDIAC CATHETERIZATION N/A 07/05/2016   Procedure: Left Heart Cath and Coronary Angiography;  Surgeon: Burnell Blanks, MD;  Location: [facility];  Service: Cardiovascular;  Laterality: N/A;  . CARDIAC CATHETERIZATION  03/12/2018  . CAROTID ENDARTERECTOMY Left ?2009  . CARPAL TUNNEL RELEASE Right   . CHOLECYSTECTOMY N/A 04/24/2018   Procedure: LAPAROSCOPIC CHOLECYSTECTOMY;  Surgeon: Aviva Signs, MD;  Location: [facility];  Service: General;  Laterality: N/A;  . COLECTOMY  2000   Secondary to intussuception  . COLONOSCOPY  08/24/2004   Normal rectum,Small polyp at 25 cm, cold snared/ The remainder of the colonic mucosa appeared normal  . COLONOSCOPY  09/24/2012   Dr. Mike Craze hemorrhoids-likely source of hematochezia.Colonic diverticulosis  . COLONOSCOPY N/A 05/01/2017   Dr. Gala Romney: Diverticulosis, next colonoscopy in 5 years.  Marland Kitchen  ENDARTERECTOMY Right 07/07/2016   Procedure: ENDARTERECTOMY CAROTID;  Surgeon: Rosetta Posner, MD;  Location: [facility] OR;  Service: Vascular;  Laterality: Right;  . ESOPHAGOGASTRODUODENOSCOPY N/A 12/09/2014   Dr. Gala Romney: Erosive reflux esophagitis. Peptic Ulcer disease secondary to H.pylori. small hiatal hernia. Nonbleeding duodenal AVM. Duodenal erosions. TREATED WITH PREVPAC  . ESOPHAGOGASTRODUODENOSCOPY N/A 03/15/2015   Dr. Gala Romney: small hiatal hernia, healed PUD, duodenal AVM.   Marland Kitchen ESOPHAGOGASTRODUODENOSCOPY N/A 05/01/2017   Dr. Gala Romney: LA grade a  esophagitis, gastritis, no H pylori  . FRACTURE SURGERY    . LEFT HEART CATH AND CORONARY ANGIOGRAPHY N/A 03/12/2018   Procedure: LEFT HEART CATH AND CORONARY ANGIOGRAPHY;  Surgeon: Burnell Blanks, MD;  Location: [place] [facility];  Service: Cardiovascular;  Laterality: N/A;  . Multiple bilateral arm/wrist surgeries after falls    . ORIF SHOULDER FRACTURE Left 06/2002   "shattered; put a bunch of metal plates in it"  . PATCH ANGIOPLASTY Right 07/07/2016   Procedure: PATCH ANGIOPLASTY USING 0.8CM X 7.6CM HEMASHIELD PATCH;  Surgeon: Rosetta Posner, MD;  Location: [facility];  Service: Vascular;  Laterality: Right;  . SHOULDER ARTHROSCOPY W/ ROTATOR CUFF REPAIR Right 1999  . SHOULDER OPEN ROTATOR CUFF REPAIR Left 1992   Metal implant     Current Outpatient Medications  Medication Sig Dispense Refill  . aspirin EC 81 MG tablet Take 81 mg by mouth at bedtime.     Marland Kitchen atorvastatin (LIPITOR) 10 MG tablet Take 1 tablet (10 mg total) by mouth daily. 30 tablet 11  . buPROPion (WELLBUTRIN SR) 150 MG 12 hr tablet Take 150 mg by mouth 2 (two) times daily.     . fluticasone (FLONASE) 50 MCG/ACT nasal spray Place 2 sprays into both nostrils daily as needed for allergies.     . hydrochlorothiazide (HYDRODIURIL) 12.5 MG tablet Take 12.5 mg by mouth daily.     Marland Kitchen levothyroxine (SYNTHROID, LEVOTHROID) 150 MCG tablet Take 150 mcg by mouth daily before breakfast.     . losartan (COZAAR) 100 MG tablet Take 100 mg by mouth daily.     . metoprolol succinate (TOPROL XL) 25 MG 24 hr tablet Take 1 tablet (25 mg total) by mouth daily. 90 tablet 0  . pantoprazole (PROTONIX) 40 MG tablet Take 1 tablet (40 mg total) by mouth daily. 30 tablet 11   No current facility-administered medications for this visit.     Allergies as of 05/13/2018 - Review Complete 05/13/2018  Allergen Reaction Noted  . Lyrica [pregabalin] Shortness Of Breath 09/17/2012  . Celecoxib Other (See Comments)   . Codeine Itching 11/09/2014     Family History  Problem Relation Age of Onset  . COPD Mother   . Mental illness Mother   . Hypertension Mother   . Varicose Veins Mother   . Heart attack Mother 43  . Hypertension Father   . Alcohol abuse Father   . Colon cancer Neg Hx     Social History   Socioeconomic History  . Marital status: Married    Spouse name: [name]  . Number of children: 0  . Years of education: Ged  . Highest education level: Not on file  Occupational History    Comment: Disabled  Social Needs  . Financial resource strain: Not on file  . Food insecurity:    Worry: Not on file    Inability: Not on file  . Transportation needs:    Medical: Not on file    Non-medical: Not on file  Tobacco  Use  . Smoking status: Former Smoker    Packs/day: 0.20    Years: 45.00    Pack years: 9.00    Types: Cigarettes    Last attempt to quit: 12/21/2017    Years since quitting: 0.3  . Smokeless tobacco: Never Used  Substance and Sexual Activity  . Alcohol use: Yes    Alcohol/week: 0.0 oz    Comment: stopped 08/2018  . Drug use: No  . Sexual activity: Not Currently  Lifestyle  . Physical activity:    Days per week: Not on file    Minutes per session: Not on file  . Stress: Not on file  Relationships  . Social connections:    Talks on phone: Not on file    Gets together: Not on file    Attends religious service: Not on file    Active member of club or organization: Not on file    Attends meetings of clubs or organizations: Not on file    Relationship status: Not on file  Other Topics Concern  . Not on file  Social History Narrative   Patient lives at home with his wife. [facility]).    Disabled.   Right handed.   Caffeine- two cups daily.   Three step children.    Review of Systems: Gen: Denies fever, chills, anorexia. Denies fatigue, weakness, weight loss.  CV: see HPI  Resp: Denies dyspnea at rest, cough, wheezing, coughing up blood, and pleurisy. GI: see HPI  Derm: Denies rash,  itching, dry skin Psych: Denies depression, anxiety, memory loss, confusion. No homicidal or suicidal ideation.  Heme: Denies bruising, bleeding, and enlarged lymph nodes.  Physical Exam: BP 114/75   Pulse 71   Temp (!) 97.2 F (36.2 C) (Oral)   Ht 6' (1.829 m)   Wt 222 lb 6.4 oz (100.9 kg)   BMI 30.16 kg/m  General:   Alert and oriented. No distress noted. Pleasant and cooperative.  Head:  Normocephalic and atraumatic. Eyes:  Conjuctiva clear without scleral icterus. Mouth:  Oral mucosa pink and moist.  Abdomen:  +BS, soft, non-tender and non-distended. No rebound or guarding. No HSM or masses noted. Msk:  Symmetrical without gross deformities. Normal posture. Extremities:  Without edema. Neurologic:  Alert and  oriented x4 Psych:  Alert and cooperative. Normal mood and affect.  Lab Results  Component Value Date   WBC 8.9 04/25/2018   HGB 13.6 04/25/2018   HCT 41.7 04/25/2018   MCV 88.9 04/25/2018   PLT 167 04/25/2018

## 2018-05-13 NOTE — Patient Instructions (Signed)
I will check with cardiology about the heart monitor.  We have ordered a CT scan for further evaluation.   Please stop Protonix. Start Dexilant samples once each day. Let me know how this does for you, so I can call into your pharmacy if this helps.  Keep appointment with cardiology.  I will talk to Dr. Gala Romney about the heme positive stool.  It was a pleasure to see you today. I strive to create trusting relationships with patients to provide genuine, compassionate, and quality care. I value your feedback. If you receive a survey regarding your visit,  I greatly appreciate you taking time to fill this out.   Annitta Needs, PhD, ANP-BC [facility]

## 2018-05-14 NOTE — Patient Instructions (Signed)
No PA needed for CT abd/pelvis w/contrast per [facility] website.

## 2018-05-19 NOTE — Assessment & Plan Note (Signed)
Could be contributing to abdominal pain. Stop Protonix. Trial of Dexilant.

## 2018-05-19 NOTE — Assessment & Plan Note (Signed)
67 year old male now with resolved RUQ pain s/p cholecystectomy and now with epigastric pain for several months. Awakens him at night. EGD Jun 2018 with LA Grade A esophagitis, negative H.pylori. Will stop Protonix, trial Dexilant. Encouraged to keep cardiology appt upcoming. Will pursue CT in interim.

## 2018-05-19 NOTE — Assessment & Plan Note (Addendum)
Completed through PCP. Colonoscopy recently on file June 2018 with diverticulosis. No overt GI bleeding. No anemia on recent CBC. No FH of colon cancer. Will discuss with Dr. Gala Romney further as recent TCS/EGD are both on file and no evidence of anemia.   Addendum on 7/1: discussed with Dr. Gala Romney. Recent endoscopic evaluations on file. Follow clinically with serial CBC. No need for early interval colonoscopy unless clinical changes.

## 2018-05-20 ENCOUNTER — Encounter (INDEPENDENT_AMBULATORY_CARE_PROVIDER_SITE_OTHER): Payer: Self-pay

## 2018-05-20 NOTE — Progress Notes (Signed)
cc'd to pcp 

## 2018-05-24 ENCOUNTER — Ambulatory Visit (INDEPENDENT_AMBULATORY_CARE_PROVIDER_SITE_OTHER): Payer: Medicare Other

## 2018-05-24 DIAGNOSIS — I472 Ventricular tachycardia, unspecified: Secondary | ICD-10-CM

## 2018-05-24 DIAGNOSIS — R002 Palpitations: Secondary | ICD-10-CM | POA: Diagnosis not present

## 2018-06-03 ENCOUNTER — Ambulatory Visit (HOSPITAL_COMMUNITY)
Admission: RE | Admit: 2018-06-03 | Discharge: 2018-06-03 | Disposition: A | Discharge status: Home / Self Care | Payer: Medicare Other | Source: Ambulatory Visit | Attending: Gastroenterology | Admitting: Gastroenterology

## 2018-06-03 DIAGNOSIS — Z9889 Other specified postprocedural states: Secondary | ICD-10-CM | POA: Insufficient documentation

## 2018-06-03 DIAGNOSIS — K4090 Unilateral inguinal hernia, without obstruction or gangrene, not specified as recurrent: Secondary | ICD-10-CM | POA: Diagnosis not present

## 2018-06-03 DIAGNOSIS — R1013 Epigastric pain: Secondary | ICD-10-CM

## 2018-06-03 MED ORDER — IOPAMIDOL (ISOVUE-300) INJECTION 61%
100.0000 mL | Freq: Once | INTRAVENOUS | Status: AC | PRN
  Administered 2018-06-03: 100 mL via INTRAVENOUS

## 2018-06-05 ENCOUNTER — Telehealth: Payer: Self-pay | Admitting: Cardiovascular Disease

## 2018-06-05 NOTE — Telephone Encounter (Signed)
Per Dr. Bronson Ing :  reschedule Seth Savage scheduled for tomorrow and schedule him and echo and f/u after testing . Called and left patient a message to call the office.

## 2018-06-06 ENCOUNTER — Other Ambulatory Visit: Payer: Self-pay

## 2018-06-06 ENCOUNTER — Telehealth: Payer: Self-pay | Admitting: Cardiovascular Disease

## 2018-06-06 ENCOUNTER — Encounter

## 2018-06-06 ENCOUNTER — Ambulatory Visit: Payer: Medicare Other | Admitting: Cardiovascular Disease

## 2018-06-06 DIAGNOSIS — I472 Ventricular tachycardia, unspecified: Secondary | ICD-10-CM

## 2018-06-06 DIAGNOSIS — R002 Palpitations: Secondary | ICD-10-CM

## 2018-06-06 NOTE — Progress Notes (Signed)
Called and line is busy.

## 2018-06-06 NOTE — Telephone Encounter (Signed)
Echo scheduled in the [facility] Jun 27, 2018

## 2018-06-06 NOTE — Progress Notes (Signed)
Small post-op fluid collection at site of RECENT cholecystectomy. No other abnormalities. He had this epigastric pain BEFORE cholecystectomy. I am not concerned about any issues from that surgery and this is likely due to recent surgery completed. However, how is he doing?

## 2018-06-07 NOTE — Progress Notes (Signed)
Pt's wife [name] is aware of his results. She said he is still having episodes almost every night with reflux. He will jump straight up in bed and have to get up for awhile. She said it does not matter what he eats. He has stopped eating after 5:00 pm in the evening. He is taking Protonix once a day. The Dexilant that he tried did not help at all. He has gotten some Gaviscon OTC and that has seemed to help more than anything. Vicente Males, any recommendations?

## 2018-06-07 NOTE — Progress Notes (Signed)
Increase Protonix to BID. Please arrange follow-up with Dr. Gala Romney.

## 2018-06-10 NOTE — Progress Notes (Signed)
LMOM to call.

## 2018-06-11 ENCOUNTER — Encounter: Payer: Self-pay | Admitting: Internal Medicine

## 2018-06-11 ENCOUNTER — Other Ambulatory Visit: Payer: Self-pay | Admitting: Cardiovascular Disease

## 2018-06-11 DIAGNOSIS — I472 Ventricular tachycardia, unspecified: Secondary | ICD-10-CM

## 2018-06-11 NOTE — Progress Notes (Signed)
PATIENT SCHEDULED AND LETTER SENT  °

## 2018-06-11 NOTE — Progress Notes (Signed)
PT is aware. Forwarding to [place] to make appt with Dr. Gala Romney for follow up.

## 2018-06-26 ENCOUNTER — Telehealth: Payer: Self-pay | Admitting: *Deleted

## 2018-06-26 NOTE — Progress Notes (Signed)
Cardiology Office Note    Date:  07/01/2018   ID:  Seth Savage, DOB Jan 17, 1951, MRN [id number]  PCP:  Glenda Chroman, MD  Cardiologist: Kate Sable, MD  Chief Complaint  Patient presents with  . Follow-up    History of Present Illness:  Seth Savage is a 67 y.o. male with history of chest pain and nonobstructive CAD on cardiac catheterization 02/2018, carotid disease status post bilateral carotid endarterectomy, hypertension, HLD, COPD.  He was seen in the hospital for question of wide-complex tachycardia after laparoscopic cholecystectomy 04/24/2018.  Dr. Domenic Polite reviewed the strips and he had some VT several telemetry strips showed long RP tachycardia with IVCD.  Was felt he might have intermittent atrial tachycardia with IVCD as well.  Troponins were negative no further work-up was recommended given his recent cath results.  He was started on Toprol-XL 25 mg daily and set up for 30-day monitor.  This was resulted 05/24/2018 and showed normal sinus rhythm with sinus bradycardia and rare PVCs.  No significant arrhythmia.  2D echo was also performed 06/27/2018 LVEF 55 to 60% with grade 1 DD mild mitral regurgitation.  Patient comes in today for follow-up.  He complains of waking up from sleep with significant indigestion and sharp epigastric pain with reflux symptoms.  Is starting to get better since Protonix was doubled and he is eating earlier in the evening before he goes to bed.  He also has a lot of reflux with eating lettuce.  He occasionally gets dizzy if he stands up quickly but overall has tolerated his blood pressure medicines.  He quit smoking and is trying to cut back on salt.  He forgot to take his blood pressure medicines until right before he came here today.  Blood pressure is up a little.  Denies palpitations or presyncope.   Past Medical History:  Diagnosis Date  . Arthritis    "wrists, thumb on left hand" (03/12/2018)  . Back injury 2003   "broke my back in 2  places; never had any OR for it" (03/12/2018)  . Carotid artery occlusion   . Childhood asthma   . COPD (chronic obstructive pulmonary disease) ([place])   . Essential hypertension   . GERD (gastroesophageal reflux disease)   . Goiter    Radioactive iodine treatment  . H pylori ulcer Jan 2016   Treated with Prevpac  . History of bronchitis   . History of kidney stones   . Hypercholesterolemia   . Hypertension   . Hypothyroid   . Migraine    "been about 2 yr w/out a headache" (03/12/2018)  . Neuropathy   . Pneumonia 1969  . Shortness of breath dyspnea    with exertion  . Wears glasses     Past Surgical History:  Procedure Laterality Date  . BIOPSY  05/01/2017   Procedure: BIOPSY;  Surgeon: Daneil Dolin, MD;  [facility];  Service: Endoscopy;;  gastric  . CARDIAC CATHETERIZATION N/A 07/05/2016   Procedure: Left Heart Cath and Coronary Angiography;  Surgeon: Burnell Blanks, MD;  Location: [place] [facility];  Service: Cardiovascular;  Laterality: N/A;  . CARDIAC CATHETERIZATION  03/12/2018  . CAROTID ENDARTERECTOMY Left ?2009  . CARPAL TUNNEL RELEASE Right   . CHOLECYSTECTOMY N/A 04/24/2018   Procedure: LAPAROSCOPIC CHOLECYSTECTOMY;  Surgeon: Aviva Signs, MD;  Location: [facility];  Service: General;  Laterality: N/A;  . COLECTOMY  2000   Secondary to intussuception  . COLONOSCOPY  08/24/2004  Normal rectum,Small polyp at 25 cm, cold snared/ The remainder of the colonic mucosa appeared normal  . COLONOSCOPY  09/24/2012   Dr. Mike Craze hemorrhoids-likely source of hematochezia.Colonic diverticulosis  . COLONOSCOPY N/A 05/01/2017   Dr. Gala Romney: Diverticulosis, next colonoscopy in 5 years.  Marland Kitchen ENDARTERECTOMY Right 07/07/2016   Procedure: ENDARTERECTOMY CAROTID;  Surgeon: Rosetta Posner, MD;  Location: [facility] OR;  Service: Vascular;  Laterality: Right;  . ESOPHAGOGASTRODUODENOSCOPY N/A 12/09/2014   Dr. Gala Romney: Erosive reflux esophagitis. Peptic Ulcer disease secondary to  H.pylori. small hiatal hernia. Nonbleeding duodenal AVM. Duodenal erosions. TREATED WITH PREVPAC  . ESOPHAGOGASTRODUODENOSCOPY N/A 03/15/2015   Dr. Gala Romney: small hiatal hernia, healed PUD, duodenal AVM.   Marland Kitchen ESOPHAGOGASTRODUODENOSCOPY N/A 05/01/2017   Dr. Gala Romney: LA grade a esophagitis, gastritis, no H pylori  . FRACTURE SURGERY    . LEFT HEART CATH AND CORONARY ANGIOGRAPHY N/A 03/12/2018   Procedure: LEFT HEART CATH AND CORONARY ANGIOGRAPHY;  Surgeon: Burnell Blanks, MD;  Location: [place] [facility];  Service: Cardiovascular;  Laterality: N/A;  . Multiple bilateral arm/wrist surgeries after falls    . ORIF SHOULDER FRACTURE Left 06/2002   "shattered; put a bunch of metal plates in it"  . PATCH ANGIOPLASTY Right 07/07/2016   Procedure: PATCH ANGIOPLASTY USING 0.8CM X 7.6CM HEMASHIELD PATCH;  Surgeon: Rosetta Posner, MD;  Location: [place];  Service: Vascular;  Laterality: Right;  . SHOULDER ARTHROSCOPY W/ ROTATOR CUFF REPAIR Right 1999  . SHOULDER OPEN ROTATOR CUFF REPAIR Left 1992   Metal implant     Current Medications: Current Meds  Medication Sig  . aspirin EC 81 MG tablet Take 81 mg by mouth at bedtime.   Marland Kitchen atorvastatin (LIPITOR) 10 MG tablet Take 1 tablet (10 mg total) by mouth daily.  Marland Kitchen buPROPion (WELLBUTRIN SR) 150 MG 12 hr tablet Take 150 mg by mouth 2 (two) times daily.   . fluticasone (FLONASE) 50 MCG/ACT nasal spray Place 2 sprays into both nostrils daily as needed for allergies.   . hydrochlorothiazide (HYDRODIURIL) 12.5 MG tablet Take 12.5 mg by mouth daily.   Marland Kitchen levothyroxine (SYNTHROID, LEVOTHROID) 150 MCG tablet Take 150 mcg by mouth daily before breakfast.   . losartan (COZAAR) 100 MG tablet Take 100 mg by mouth daily.   . metoprolol succinate (TOPROL XL) 25 MG 24 hr tablet Take 1 tablet (25 mg total) by mouth daily.  . pantoprazole (PROTONIX) 40 MG tablet Take 1 tablet (40 mg total) by mouth daily.     Allergies:   Lyrica [pregabalin]; Celecoxib; and Codeine    Social History   Socioeconomic History  . Marital status: Married    Spouse name: [name]  . Number of children: 0  . Years of education: Ged  . Highest education level: Not on file  Occupational History    Comment: Disabled  Social Needs  . Financial resource strain: Not on file  . Food insecurity:    Worry: Not on file    Inability: Not on file  . Transportation needs:    Medical: Not on file    Non-medical: Not on file  Tobacco Use  . Smoking status: Former Smoker    Packs/day: 0.20    Years: 45.00    Pack years: 9.00    Types: Cigarettes    Last attempt to quit: 12/21/2017    Years since quitting: 0.5  . Smokeless tobacco: Never Used  Substance and Sexual Activity  . Alcohol use: Yes    Alcohol/week: 0.0 standard drinks  Comment: stopped 08/2018  . Drug use: No  . Sexual activity: Not Currently  Lifestyle  . Physical activity:    Days per week: Not on file    Minutes per session: Not on file  . Stress: Not on file  Relationships  . Social connections:    Talks on phone: Not on file    Gets together: Not on file    Attends religious service: Not on file    Active member of club or organization: Not on file    Attends meetings of clubs or organizations: Not on file    Relationship status: Not on file  Other Topics Concern  . Not on file  Social History Narrative   Patient lives at home with his wife. [facility]).    Disabled.   Right handed.   Caffeine- two cups daily.   Three step children.     Family History:  The patient's family history includes Alcohol abuse in his father; COPD in his mother; Heart attack (age of onset: 9) in his mother; Hypertension in his father and mother; Mental illness in his mother; Varicose Veins in his mother.   ROS:   Please see the history of present illness.    Review of Systems  Constitution: Negative.  HENT: Negative.   Cardiovascular: Negative.   Respiratory: Negative.   Endocrine: Negative.    Hematologic/Lymphatic: Negative.   Musculoskeletal: Negative.   Gastrointestinal: Positive for abdominal pain and heartburn.  Genitourinary: Negative.   Neurological: Negative.    All other systems reviewed and are negative.   PHYSICAL EXAM:   VS:  BP (!) 144/88 (BP Location: Left Arm, Patient Position: Sitting)   Pulse 71   Ht 6' (1.829 m)   Wt 224 lb 6.4 oz (101.8 kg)   SpO2 96%   BMI 30.43 kg/m   Physical Exam  GEN: Well nourished, well developed, in no acute distress  Neck: no JVD, carotid bruits, or masses Cardiac:RRR; no murmurs, rubs, or gallops  Respiratory:  clear to auscultation bilaterally, normal work of breathing GI: soft, nontender, nondistended, + BS Ext: without cyanosis, clubbing, or edema, Good distal pulses bilaterally Neuro:  Alert and Oriented x 3 Psych: euthymic mood, full affect  Wt Readings from Last 3 Encounters:  07/01/18 224 lb 6.4 oz (101.8 kg)  05/13/18 222 lb 6.4 oz (100.9 kg)  05/07/18 222 lb (100.7 kg)      Studies/Labs Reviewed:   EKG:  EKG is not ordered today.    Recent Labs: 04/25/2018: ALT 50; BUN 16; Creatinine, Ser 1.14; Hemoglobin 13.6; Magnesium 1.9; Platelets 167; Potassium 4.2; Sodium 137   Lipid Panel    Component Value Date/Time   CHOL 119 03/13/2018 0256   TRIG 213 (H) 03/13/2018 0256   HDL 29 (L) 03/13/2018 0256   CHOLHDL 4.1 03/13/2018 0256   VLDL 43 (H) 03/13/2018 0256   LDLCALC 47 03/13/2018 0256    Additional studies/ records that were reviewed today include:  2D echo 06/27/2018 Study Conclusions   - Left ventricle: The cavity size was normal. Wall thickness was   normal. Systolic function was normal. The estimated ejection   fraction was in the range of 55% to 60%. Wall motion was normal;   there were no regional wall motion abnormalities. Doppler   parameters are consistent with abnormal left ventricular   relaxation (grade 1 diastolic dysfunction). Doppler parameters   are consistent with indeterminate  ventricular filling pressure. - Aortic valve: Mildly calcified annulus. Trileaflet. - Mitral valve:  There was mild regurgitation.     Cardiac catheterization 03/12/2018:  Prox RCA lesion is 30% stenosed.  Post Atrio lesion is 40% stenosed.  Ost RPDA lesion is 20% stenosed.  Ost 1st Mrg lesion is 30% stenosed.  Ost 2nd Mrg lesion is 30% stenosed.  Ost Cx to Prox Cx lesion is 20% stenosed.  Ost LAD to Prox LAD lesion is 30% stenosed.  The left ventricular systolic function is normal.  LV end diastolic pressure is normal.  The left ventricular ejection fraction is 55-65% by visual estimate.  There is no mitral valve regurgitation.   1. Mild non-obstructive CAD 2. Normal LV systolic function   16-XWR monitor 05/24/2018  study Highlights    Sinus rhythm and sinus bradycardia with rare PVCs. No significant arrhythmias.       ASSESSMENT:    1. Coronary artery disease due to lipid rich plaque   2. VT (ventricular tachycardia) ([place])   3. Hypercholesterolemia   4. Essential hypertension      PLAN:  In order of problems listed above:  CAD with nonobstructive disease on cardiac catheterization 02/2018, 2D echo normal LVEF, grade 1 DD mild MR. chest pain felt to be GI related.  Has follow-up with GI in September.  Doing better on increased Protonix.  VT after cholecystectomy.  Recent 30-day monitor without significant arrhythmia, normal LV function and nonobstructive CAD on cath.  No further work-up at this time.  Follow-up with Dr. Domenic Polite in 6 months.  Hypercholesterolemia on Lipitor 10 mg daily  Essential hypertension blood pressure slightly elevated today.  He just took his blood pressure medicines prior to coming.  I have asked him to check it at home and call us if it stays above 135/85.  He has cut back on his salt.  Medication Adjustments/Labs and Tests Ordered: Current medicines are reviewed at length with the patient today.  Concerns regarding medicines are  outlined above.  Medication changes, Labs and Tests ordered today are listed in the Patient Instructions below. There are no Patient Instructions on file for this visit.   Sumner Boast, PA-C  07/01/2018 12:28 PM    [facility] [address], [place], [place]  [address] Phone: [phone]; Fax: [phone]

## 2018-06-26 NOTE — Telephone Encounter (Signed)
Called patient with test results. No answer. Left message to call back.  

## 2018-06-26 NOTE — Telephone Encounter (Signed)
-----   Message from Herminio Commons, MD sent at 06/26/2018  8:16 AM EDT ----- Predominantly in normal rhythm with rare extra beats.  No worrisome arrhythmias.

## 2018-06-27 ENCOUNTER — Ambulatory Visit (INDEPENDENT_AMBULATORY_CARE_PROVIDER_SITE_OTHER): Payer: Medicare Other

## 2018-06-27 ENCOUNTER — Other Ambulatory Visit: Payer: Self-pay

## 2018-06-27 DIAGNOSIS — I472 Ventricular tachycardia, unspecified: Secondary | ICD-10-CM

## 2018-07-01 ENCOUNTER — Telehealth: Payer: Self-pay | Admitting: Cardiovascular Disease

## 2018-07-01 ENCOUNTER — Encounter: Payer: Self-pay | Admitting: Physician Assistant

## 2018-07-01 ENCOUNTER — Ambulatory Visit: Payer: Medicare Other | Admitting: Physician Assistant

## 2018-07-01 VITALS — BP 144/88 | HR 71 | Ht 72.0 in | Wt 224.4 lb

## 2018-07-01 DIAGNOSIS — I10 Essential (primary) hypertension: Secondary | ICD-10-CM | POA: Diagnosis not present

## 2018-07-01 DIAGNOSIS — I2583 Coronary atherosclerosis due to lipid rich plaque: Secondary | ICD-10-CM | POA: Diagnosis not present

## 2018-07-01 DIAGNOSIS — I472 Ventricular tachycardia, unspecified: Secondary | ICD-10-CM

## 2018-07-01 DIAGNOSIS — E7800 Pure hypercholesterolemia, unspecified: Secondary | ICD-10-CM | POA: Diagnosis not present

## 2018-07-01 DIAGNOSIS — I2510 Atherosclerotic heart disease of native coronary artery without angina pectoris: Secondary | ICD-10-CM

## 2018-07-01 NOTE — Patient Instructions (Signed)
Medication Instructions:  Your physician recommends that you continue on your current medications as directed. Please refer to the Current Medication list given to you today.   Labwork: NONE   Testing/Procedures: NONE   Follow-Up: Your physician wants you to follow-up in: 6 Months. You will receive a reminder letter in the mail two months in advance. If you don't receive a letter, please call our office to schedule the follow-up appointment.   Any Other Special Instructions Will Be Listed Below (If Applicable).  Your physician has requested that you regularly monitor and record your blood pressure readings at home. Please use the same machine at the same time of day to check your readings and record them to bring to your follow-up visit.  Call office if BP is running above 135/85.    If you need a refill on your cardiac medications before your next appointment, please call your pharmacy. Thank you for choosing [facility]!

## 2018-07-01 NOTE — Telephone Encounter (Signed)
Returning call.

## 2018-07-01 NOTE — Telephone Encounter (Signed)
Pt seen in clinic today for test results

## 2018-07-04 NOTE — Progress Notes (Signed)
LMOM to call.

## 2018-07-04 NOTE — Progress Notes (Signed)
How is he doing with BID therapy>

## 2018-07-05 ENCOUNTER — Telehealth: Payer: Self-pay | Admitting: Internal Medicine

## 2018-07-05 NOTE — Telephone Encounter (Signed)
Lmom, no documentation is in the chart that someone contacted pt. Pts next appointment date was left on the phone.

## 2018-07-05 NOTE — Telephone Encounter (Signed)
PATIENT THINKS SOMEONE CALLED HIM FROM HERE, PLEASE CALL

## 2018-07-05 NOTE — Progress Notes (Signed)
PT said he is doing much better on the bid dosing of Protonix.

## 2018-07-24 ENCOUNTER — Other Ambulatory Visit: Payer: Self-pay | Admitting: General Surgery

## 2018-08-16 ENCOUNTER — Other Ambulatory Visit: Payer: Self-pay | Admitting: *Deleted

## 2018-08-16 ENCOUNTER — Encounter: Payer: Self-pay | Admitting: *Deleted

## 2018-08-16 ENCOUNTER — Encounter: Payer: Self-pay | Admitting: Internal Medicine

## 2018-08-16 ENCOUNTER — Other Ambulatory Visit: Payer: Self-pay

## 2018-08-16 ENCOUNTER — Ambulatory Visit: Payer: Medicare Other | Admitting: Internal Medicine

## 2018-08-16 ENCOUNTER — Encounter (HOSPITAL_COMMUNITY)
Admission: RE | Admit: 2018-08-16 | Discharge: 2018-08-16 | Disposition: A | Discharge status: Home / Self Care | Payer: Medicare Other | Source: Ambulatory Visit | Attending: Internal Medicine | Admitting: Internal Medicine

## 2018-08-16 VITALS — BP 131/84 | HR 67 | Temp 97.0°F | Ht 72.0 in | Wt 220.6 lb

## 2018-08-16 DIAGNOSIS — R0789 Other chest pain: Secondary | ICD-10-CM

## 2018-08-16 DIAGNOSIS — R1011 Right upper quadrant pain: Secondary | ICD-10-CM | POA: Diagnosis not present

## 2018-08-16 DIAGNOSIS — R1013 Epigastric pain: Secondary | ICD-10-CM

## 2018-08-16 MED ORDER — PANTOPRAZOLE SODIUM 40 MG PO TBEC: 40.0000 mg | DELAYED_RELEASE_TABLET | Freq: Two times a day (BID) | ORAL | 3 refills | Status: DC

## 2018-08-16 NOTE — Progress Notes (Signed)
Primary Care Physician:  Glenda Chroman, MD Primary Gastroenterologist:  Dr. Gala Romney  Pre-Procedure History & Physical: HPI:  Seth Savage is a 67 y.o. male here for further evaluation of epigastric/retroxiphoid pain.  Gallbladder out in June of this year for right upper quadrant pain.  Pain persists.  Over the past couple of months he has had worsening retroxiphoid pain which occurs primarily at night wakes him up in the middle of the night he has to sit up and find some antiacids to take - which does briefly ameleorate in his symptoms but not totally relieve them.  No dysphagia.  No odynophagia.  Going to twice a day Protonix has helped but symptoms continue to breakthrough.  No melena or rectal bleeding no nausea or vomiting.  EGD last year demonstrated non-H. pylori gastritis mild reflux esophagitis.  Prior history of peptic ulcer disease H pylori status post treatment.  CT scan recently demonstrated a small amount of fluid in the gallbladder fossa  - nothing else to really explain symptoms.  No alcohol.  Does not consume other medications right before going to bed.  Cardiac evaluation including cardiac cath turned out well without any evidence of significant coronary artery disease by report.  Past Medical History:  Diagnosis Date  . Arthritis    "wrists, thumb on left hand" (03/12/2018)  . Back injury 2003   "broke my back in 2 places; never had any OR for it" (03/12/2018)  . Carotid artery occlusion   . Childhood asthma   . COPD (chronic obstructive pulmonary disease) ([place])   . Essential hypertension   . GERD (gastroesophageal reflux disease)   . Goiter    Radioactive iodine treatment  . H pylori ulcer Jan 2016   Treated with Prevpac  . History of bronchitis   . History of kidney stones   . Hypercholesterolemia   . Hypertension   . Hypothyroid   . Migraine    "been about 2 yr w/out a headache" (03/12/2018)  . Neuropathy   . Pneumonia 1969  . Shortness of breath dyspnea    with exertion  . Wears glasses     Past Surgical History:  Procedure Laterality Date  . BIOPSY  05/01/2017   Procedure: BIOPSY;  Surgeon: Daneil Dolin, MD;  [facility];  Service: Endoscopy;;  gastric  . CARDIAC CATHETERIZATION N/A 07/05/2016   Procedure: Left Heart Cath and Coronary Angiography;  Surgeon: Burnell Blanks, MD;  Location: [place] [facility];  Service: Cardiovascular;  Laterality: N/A;  . CARDIAC CATHETERIZATION  03/12/2018  . CAROTID ENDARTERECTOMY Left ?2009  . CARPAL TUNNEL RELEASE Right   . CHOLECYSTECTOMY N/A 04/24/2018   Procedure: LAPAROSCOPIC CHOLECYSTECTOMY;  Surgeon: Aviva Signs, MD;  Location[facility];  Service: General;  Laterality: N/A;  . COLECTOMY  2000   Secondary to intussuception  . COLONOSCOPY  08/24/2004   Normal rectum,Small polyp at 25 cm, cold snared/ The remainder of the colonic mucosa appeared normal  . COLONOSCOPY  09/24/2012   Dr. Mike Craze hemorrhoids-likely source of hematochezia.Colonic diverticulosis  . COLONOSCOPY N/A 05/01/2017   Dr. Gala Romney: Diverticulosis, next colonoscopy in 5 years.  Marland Kitchen ENDARTERECTOMY Right 07/07/2016   Procedure: ENDARTERECTOMY CAROTID;  Surgeon: Rosetta Posner, MD;  Location: [facility];  Service: Vascular;  Laterality: Right;  . ESOPHAGOGASTRODUODENOSCOPY N/A 12/09/2014   Dr. Gala Romney: Erosive reflux esophagitis. Peptic Ulcer disease secondary to H.pylori. small hiatal hernia. Nonbleeding duodenal AVM. Duodenal erosions. TREATED WITH PREVPAC  . ESOPHAGOGASTRODUODENOSCOPY N/A  03/15/2015   Dr. Gala Romney: small hiatal hernia, healed PUD, duodenal AVM.   Marland Kitchen ESOPHAGOGASTRODUODENOSCOPY N/A 05/01/2017   Dr. Gala Romney: LA grade a esophagitis, gastritis, no H pylori  . FRACTURE SURGERY    . LEFT HEART CATH AND CORONARY ANGIOGRAPHY N/A 03/12/2018   Procedure: LEFT HEART CATH AND CORONARY ANGIOGRAPHY;  Surgeon: Burnell Blanks, MD;  Location: [place] [facility];  Service: Cardiovascular;  Laterality: N/A;  . Multiple  bilateral arm/wrist surgeries after falls    . ORIF SHOULDER FRACTURE Left 06/2002   "shattered; put a bunch of metal plates in it"  . PATCH ANGIOPLASTY Right 07/07/2016   Procedure: PATCH ANGIOPLASTY USING 0.8CM X 7.6CM HEMASHIELD PATCH;  Surgeon: Rosetta Posner, MD;  Location: [place];  Service: Vascular;  Laterality: Right;  . SHOULDER ARTHROSCOPY W/ ROTATOR CUFF REPAIR Right 1999  . SHOULDER OPEN ROTATOR CUFF REPAIR Left 1992   Metal implant     Prior to Admission medications   Medication Sig Start Date End Date Taking? Authorizing Provider  aspirin EC 81 MG tablet Take 81 mg by mouth at bedtime.    Yes [provider]  atorvastatin (LIPITOR) 10 MG tablet Take 1 tablet (10 mg total) by mouth daily. 07/07/16  Yes Alvia Grove, PA-C  buPROPion (WELLBUTRIN SR) 150 MG 12 hr tablet Take 150 mg by mouth 2 (two) times daily.  07/30/17  Yes [provider]  Ca Carbonate-Mag Hydroxide (ROLAIDS PO) Take by mouth as needed.   Yes [provider]  fluticasone (FLONASE) 50 MCG/ACT nasal spray Place 2 sprays into both nostrils daily as needed for allergies.    Yes [provider]  hydrochlorothiazide (HYDRODIURIL) 12.5 MG tablet Take 12.5 mg by mouth daily.  06/30/16  Yes [provider]  levothyroxine (SYNTHROID, LEVOTHROID) 150 MCG tablet Take 150 mcg by mouth daily before breakfast.    Yes [provider]  losartan (COZAAR) 100 MG tablet Take 100 mg by mouth daily.  08/28/12  Yes [provider]  metoprolol succinate (TOPROL XL) 25 MG 24 hr tablet Take 1 tablet (25 mg total) by mouth daily. 04/25/18 04/25/19 Yes Aviva Signs, MD  pantoprazole (PROTONIX) 40 MG tablet Take 1 tablet (40 mg total) by mouth daily. Patient taking differently: Take 40 mg by mouth 2 (two) times daily.  09/06/15  Yes Mahala Menghini, PA-C  dicyclomine (BENTYL) 20 MG tablet Take 1 tablet (20 mg total) by mouth 2 (two) times daily. 06/07/16 06/07/16  Comer Locket, PA-C      Allergies as of 08/16/2018 - Review Complete 08/16/2018  Allergen Reaction Noted  . Lyrica [pregabalin] Shortness Of Breath 09/17/2012  . Celecoxib Other (See Comments)   . Codeine Itching 11/09/2014    Family History  Problem Relation Age of Onset  . COPD Mother   . Mental illness Mother   . Hypertension Mother   . Varicose Veins Mother   . Heart attack Mother 46  . Hypertension Father   . Alcohol abuse Father   . Colon cancer Neg Hx     Social History   Socioeconomic History  . Marital status: Married    Spouse name: [name]  . Number of children: 0  . Years of education: Ged  . Highest education level: Not on file  Occupational History    Comment: Disabled  Social Needs  . Financial resource strain: Not on file  . Food insecurity:    Worry: Not on file    Inability: Not on  file  . Transportation needs:    Medical: Not on file    Non-medical: Not on file  Tobacco Use  . Smoking status: Former Smoker    Packs/day: 0.20    Years: 45.00    Pack years: 9.00    Types: Cigarettes    Last attempt to quit: 12/21/2017    Years since quitting: 0.6  . Smokeless tobacco: Never Used  Substance and Sexual Activity  . Alcohol use: Not Currently    Alcohol/week: 0.0 standard drinks  . Drug use: No  . Sexual activity: Not Currently  Lifestyle  . Physical activity:    Days per week: Not on file    Minutes per session: Not on file  . Stress: Not on file  Relationships  . Social connections:    Talks on phone: Not on file    Gets together: Not on file    Attends religious service: Not on file    Active member of club or organization: Not on file    Attends meetings of clubs or organizations: Not on file    Relationship status: Not on file  . Intimate partner violence:    Fear of current or ex partner: Not on file    Emotionally abused: Not on file    Physically abused: Not on file    Forced sexual activity: Not on file  Other Topics Concern  . Not on file   Social History Narrative   Patient lives at home with his wife. [facility]).    Disabled.   Right handed.   Caffeine- two cups daily.   Three step children.    Review of Systems: See HPI, otherwise negative ROS  Physical Exam: BP 131/84   Pulse 67   Temp (!) 97 F (36.1 C) (Oral)   Ht 6' (1.829 m)   Wt 220 lb 9.6 oz (100.1 kg)   BMI 29.92 kg/m  General:   Alert,  Well-developed, well-nourished, pleasant and cooperative in NAD.  Mild exophthalmos present.  Accompanied by spouse. Skin:  Intact without significant lesions or rashes. Eyes:  Sclera clear, no icterus.   Conjunctiva pink. Lungs:  Clear throughout to auscultation.   No wheezes, crackles, or rhonchi. No acute distress. Heart:  Regular rate and rhythm; no murmurs, clicks, rubs,  or gallops. Abdomen: Non-distended, normal bowel sounds.  Soft and nontender without appreciable mass or hepatosplenomegaly.  Pulses:  Normal pulses noted. Extremities:  Without clubbing or edema.  Impression/Plan: Pleasant 67 year old gentleman with somewhat refractory,  crescendo epigastric/retroxiphoid pain  -  somewhat of a positional component.  Acid suppression and antiacids blunt, but do not totally resolve, his symptoms.  Differential at this time includes simply worsening of reflux, pill induced injury.  Symptoms do not sound like an occult neoplasm.  No biliary dilation on recent CT.  Further evaluation warranted.  Recommendations: Proceed with a diagnostic EGD ASAP.  The risks, benefits, limitations, alternatives and imponderables have been reviewed with the patient. Potential for esophageal dilation, biopsy, etc. have also been reviewed.  Questions have been answered. All parties agreeable.  Continue Protonix 40 mg twice daily.  Further recommendations to follow.     Notice: This dictation was prepared with Dragon dictation along with smaller phrase technology. Any transcriptional errors that result from this process are unintentional  and may not be corrected upon review.

## 2018-08-16 NOTE — H&P (View-Only) (Signed)
Primary Care Physician:  Glenda Chroman, MD Primary Gastroenterologist:  Dr. Gala Romney  Pre-Procedure History & Physical: HPI:  Seth Savage is a 67 y.o. male here for further evaluation of epigastric/retroxiphoid pain.  Gallbladder out in June of this year for right upper quadrant pain.  Pain persists.  Over the past couple of months he has had worsening retroxiphoid pain which occurs primarily at night wakes him up in the middle of the night he has to sit up and find some antiacids to take - which does briefly ameleorate in his symptoms but not totally relieve them.  No dysphagia.  No odynophagia.  Going to twice a day Protonix has helped but symptoms continue to breakthrough.  No melena or rectal bleeding no nausea or vomiting.  EGD last year demonstrated non-H. pylori gastritis mild reflux esophagitis.  Prior history of peptic ulcer disease H pylori status post treatment.  CT scan recently demonstrated a small amount of fluid in the gallbladder fossa  - nothing else to really explain symptoms.  No alcohol.  Does not consume other medications right before going to bed.  Cardiac evaluation including cardiac cath turned out well without any evidence of significant coronary artery disease by report.  Past Medical History:  Diagnosis Date  . Arthritis    "wrists, thumb on left hand" (03/12/2018)  . Back injury 2003   "broke my back in 2 places; never had any OR for it" (03/12/2018)  . Carotid artery occlusion   . Childhood asthma   . COPD (chronic obstructive pulmonary disease) ([place])   . Essential hypertension   . GERD (gastroesophageal reflux disease)   . Goiter    Radioactive iodine treatment  . H pylori ulcer Jan 2016   Treated with Prevpac  . History of bronchitis   . History of kidney stones   . Hypercholesterolemia   . Hypertension   . Hypothyroid   . Migraine    "been about 2 yr w/out a headache" (03/12/2018)  . Neuropathy   . Pneumonia 1969  . Shortness of breath dyspnea    with exertion  . Wears glasses     Past Surgical History:  Procedure Laterality Date  . BIOPSY  05/01/2017   Procedure: BIOPSY;  Surgeon: Daneil Dolin, MD;  Location: [facility];  Service: Endoscopy;;  gastric  . CARDIAC CATHETERIZATION N/A 07/05/2016   Procedure: Left Heart Cath and Coronary Angiography;  Surgeon: Burnell Blanks, MD;  Location: [facility];  Service: Cardiovascular;  Laterality: N/A;  . CARDIAC CATHETERIZATION  03/12/2018  . CAROTID ENDARTERECTOMY Left ?2009  . CARPAL TUNNEL RELEASE Right   . CHOLECYSTECTOMY N/A 04/24/2018   Procedure: LAPAROSCOPIC CHOLECYSTECTOMY;  Surgeon: Aviva Signs, MD;  Location: [facility];  Service: General;  Laterality: N/A;  . COLECTOMY  2000   Secondary to intussuception  . COLONOSCOPY  08/24/2004   Normal rectum,Small polyp at 25 cm, cold snared/ The remainder of the colonic mucosa appeared normal  . COLONOSCOPY  09/24/2012   Dr. Mike Craze hemorrhoids-likely source of hematochezia.Colonic diverticulosis  . COLONOSCOPY N/A 05/01/2017   Dr. Gala Romney: Diverticulosis, next colonoscopy in 5 years.  Marland Kitchen ENDARTERECTOMY Right 07/07/2016   Procedure: ENDARTERECTOMY CAROTID;  Surgeon: Rosetta Posner, MD;  Location: [facility] OR;  Service: Vascular;  Laterality: Right;  . ESOPHAGOGASTRODUODENOSCOPY N/A 12/09/2014   Dr. Gala Romney: Erosive reflux esophagitis. Peptic Ulcer disease secondary to H.pylori. small hiatal hernia. Nonbleeding duodenal AVM. Duodenal erosions. TREATED WITH PREVPAC  . ESOPHAGOGASTRODUODENOSCOPY N/A  03/15/2015   Dr. Gala Romney: small hiatal hernia, healed PUD, duodenal AVM.   Marland Kitchen ESOPHAGOGASTRODUODENOSCOPY N/A 05/01/2017   Dr. Gala Romney: LA grade a esophagitis, gastritis, no H pylori  . FRACTURE SURGERY    . LEFT HEART CATH AND CORONARY ANGIOGRAPHY N/A 03/12/2018   Procedure: LEFT HEART CATH AND CORONARY ANGIOGRAPHY;  Surgeon: Burnell Blanks, MD;  Location: [place] [facility];  Service: Cardiovascular;  Laterality: N/A;  . Multiple  bilateral arm/wrist surgeries after falls    . ORIF SHOULDER FRACTURE Left 06/2002   "shattered; put a bunch of metal plates in it"  . PATCH ANGIOPLASTY Right 07/07/2016   Procedure: PATCH ANGIOPLASTY USING 0.8CM X 7.6CM HEMASHIELD PATCH;  Surgeon: Rosetta Posner, MD;  Location: [facility];  Service: Vascular;  Laterality: Right;  . SHOULDER ARTHROSCOPY W/ ROTATOR CUFF REPAIR Right 1999  . SHOULDER OPEN ROTATOR CUFF REPAIR Left 1992   Metal implant     Prior to Admission medications   Medication Sig Start Date End Date Taking? Authorizing Provider  aspirin EC 81 MG tablet Take 81 mg by mouth at bedtime.    Yes [provider]  atorvastatin (LIPITOR) 10 MG tablet Take 1 tablet (10 mg total) by mouth daily. 07/07/16  Yes Alvia Grove, PA-C  buPROPion (WELLBUTRIN SR) 150 MG 12 hr tablet Take 150 mg by mouth 2 (two) times daily.  07/30/17  Yes [provider]  Ca Carbonate-Mag Hydroxide (ROLAIDS PO) Take by mouth as needed.   Yes [provider]  fluticasone (FLONASE) 50 MCG/ACT nasal spray Place 2 sprays into both nostrils daily as needed for allergies.    Yes [provider]  hydrochlorothiazide (HYDRODIURIL) 12.5 MG tablet Take 12.5 mg by mouth daily.  06/30/16  Yes [provider]  levothyroxine (SYNTHROID, LEVOTHROID) 150 MCG tablet Take 150 mcg by mouth daily before breakfast.    Yes [provider]  losartan (COZAAR) 100 MG tablet Take 100 mg by mouth daily.  08/28/12  Yes [provider]  metoprolol succinate (TOPROL XL) 25 MG 24 hr tablet Take 1 tablet (25 mg total) by mouth daily. 04/25/18 04/25/19 Yes Aviva Signs, MD  pantoprazole (PROTONIX) 40 MG tablet Take 1 tablet (40 mg total) by mouth daily. Patient taking differently: Take 40 mg by mouth 2 (two) times daily.  09/06/15  Yes Mahala Menghini, PA-C  dicyclomine (BENTYL) 20 MG tablet Take 1 tablet (20 mg total) by mouth 2 (two) times daily. 06/07/16 06/07/16  Comer Locket, PA-C      Allergies as of 08/16/2018 - Review Complete 08/16/2018  Allergen Reaction Noted  . Lyrica [pregabalin] Shortness Of Breath 09/17/2012  . Celecoxib Other (See Comments)   . Codeine Itching 11/09/2014    Family History  Problem Relation Age of Onset  . COPD Mother   . Mental illness Mother   . Hypertension Mother   . Varicose Veins Mother   . Heart attack Mother 14  . Hypertension Father   . Alcohol abuse Father   . Colon cancer Neg Hx     Social History   Socioeconomic History  . Marital status: Married    Spouse name: [name]  . Number of children: 0  . Years of education: Ged  . Highest education level: Not on file  Occupational History    Comment: Disabled  Social Needs  . Financial resource strain: Not on file  . Food insecurity:    Worry: Not on file    Inability: Not on  file  . Transportation needs:    Medical: Not on file    Non-medical: Not on file  Tobacco Use  . Smoking status: Former Smoker    Packs/day: 0.20    Years: 45.00    Pack years: 9.00    Types: Cigarettes    Last attempt to quit: 12/21/2017    Years since quitting: 0.6  . Smokeless tobacco: Never Used  Substance and Sexual Activity  . Alcohol use: Not Currently    Alcohol/week: 0.0 standard drinks  . Drug use: No  . Sexual activity: Not Currently  Lifestyle  . Physical activity:    Days per week: Not on file    Minutes per session: Not on file  . Stress: Not on file  Relationships  . Social connections:    Talks on phone: Not on file    Gets together: Not on file    Attends religious service: Not on file    Active member of club or organization: Not on file    Attends meetings of clubs or organizations: Not on file    Relationship status: Not on file  . Intimate partner violence:    Fear of current or ex partner: Not on file    Emotionally abused: Not on file    Physically abused: Not on file    Forced sexual activity: Not on file  Other Topics Concern  . Not on file   Social History Narrative   Patient lives at home with his wife. [facility]).    Disabled.   Right handed.   Caffeine- two cups daily.   Three step children.    Review of Systems: See HPI, otherwise negative ROS  Physical Exam: BP 131/84   Pulse 67   Temp (!) 97 F (36.1 C) (Oral)   Ht 6' (1.829 m)   Wt 220 lb 9.6 oz (100.1 kg)   BMI 29.92 kg/m  General:   Alert,  Well-developed, well-nourished, pleasant and cooperative in NAD.  Mild exophthalmos present.  Accompanied by spouse. Skin:  Intact without significant lesions or rashes. Eyes:  Sclera clear, no icterus.   Conjunctiva pink. Lungs:  Clear throughout to auscultation.   No wheezes, crackles, or rhonchi. No acute distress. Heart:  Regular rate and rhythm; no murmurs, clicks, rubs,  or gallops. Abdomen: Non-distended, normal bowel sounds.  Soft and nontender without appreciable mass or hepatosplenomegaly.  Pulses:  Normal pulses noted. Extremities:  Without clubbing or edema.  Impression/Plan: Pleasant 67 year old gentleman with somewhat refractory,  crescendo epigastric/retroxiphoid pain  -  somewhat of a positional component.  Acid suppression and antiacids blunt, but do not totally resolve, his symptoms.  Differential at this time includes simply worsening of reflux, pill induced injury.  Symptoms do not sound like an occult neoplasm.  No biliary dilation on recent CT.  Further evaluation warranted.  Recommendations: Proceed with a diagnostic EGD ASAP.  The risks, benefits, limitations, alternatives and imponderables have been reviewed with the patient. Potential for esophageal dilation, biopsy, etc. have also been reviewed.  Questions have been answered. All parties agreeable.  Continue Protonix 40 mg twice daily.  Further recommendations to follow.     Notice: This dictation was prepared with Dragon dictation along with smaller phrase technology. Any transcriptional errors that result from this process are unintentional  and may not be corrected upon review.

## 2018-08-16 NOTE — Patient Instructions (Signed)
Continue protonix 40 mg twice daily (Rx to [facility] - #[facility] - one tablet BID - 3 refills  Schedule EGD propofol - ASAP epigastric/retro-xyphoid pain  Further recommendations to follow

## 2018-08-19 ENCOUNTER — Encounter (HOSPITAL_COMMUNITY)
Admission: RE | Disposition: A | Discharge status: Home / Self Care | Payer: Self-pay | Source: Ambulatory Visit | Attending: Internal Medicine

## 2018-08-19 ENCOUNTER — Ambulatory Visit (HOSPITAL_COMMUNITY): Payer: Medicare Other | Admitting: Anesthesiology

## 2018-08-19 ENCOUNTER — Telehealth: Payer: Self-pay

## 2018-08-19 ENCOUNTER — Ambulatory Visit (HOSPITAL_COMMUNITY)
Admission: RE | Admit: 2018-08-19 | Discharge: 2018-08-19 | Disposition: A | Discharge status: Home / Self Care | Payer: Medicare Other | Source: Ambulatory Visit | Attending: Internal Medicine | Admitting: Internal Medicine

## 2018-08-19 ENCOUNTER — Other Ambulatory Visit: Payer: Self-pay

## 2018-08-19 ENCOUNTER — Encounter (HOSPITAL_COMMUNITY): Payer: Self-pay | Admitting: *Deleted

## 2018-08-19 DIAGNOSIS — K3189 Other diseases of stomach and duodenum: Secondary | ICD-10-CM | POA: Diagnosis not present

## 2018-08-19 DIAGNOSIS — I25119 Atherosclerotic heart disease of native coronary artery with unspecified angina pectoris: Secondary | ICD-10-CM | POA: Diagnosis not present

## 2018-08-19 DIAGNOSIS — K219 Gastro-esophageal reflux disease without esophagitis: Secondary | ICD-10-CM | POA: Insufficient documentation

## 2018-08-19 DIAGNOSIS — Z8711 Personal history of peptic ulcer disease: Secondary | ICD-10-CM | POA: Diagnosis not present

## 2018-08-19 DIAGNOSIS — E7800 Pure hypercholesterolemia, unspecified: Secondary | ICD-10-CM | POA: Insufficient documentation

## 2018-08-19 DIAGNOSIS — Z888 Allergy status to other drugs, medicaments and biological substances status: Secondary | ICD-10-CM | POA: Diagnosis not present

## 2018-08-19 DIAGNOSIS — Z7982 Long term (current) use of aspirin: Secondary | ICD-10-CM | POA: Diagnosis not present

## 2018-08-19 DIAGNOSIS — R1013 Epigastric pain: Secondary | ICD-10-CM

## 2018-08-19 DIAGNOSIS — M19032 Primary osteoarthritis, left wrist: Secondary | ICD-10-CM | POA: Insufficient documentation

## 2018-08-19 DIAGNOSIS — Z87442 Personal history of urinary calculi: Secondary | ICD-10-CM | POA: Diagnosis not present

## 2018-08-19 DIAGNOSIS — Z885 Allergy status to narcotic agent status: Secondary | ICD-10-CM | POA: Diagnosis not present

## 2018-08-19 DIAGNOSIS — I10 Essential (primary) hypertension: Secondary | ICD-10-CM | POA: Insufficient documentation

## 2018-08-19 DIAGNOSIS — E039 Hypothyroidism, unspecified: Secondary | ICD-10-CM | POA: Diagnosis not present

## 2018-08-19 DIAGNOSIS — J449 Chronic obstructive pulmonary disease, unspecified: Secondary | ICD-10-CM | POA: Insufficient documentation

## 2018-08-19 DIAGNOSIS — K449 Diaphragmatic hernia without obstruction or gangrene: Secondary | ICD-10-CM | POA: Diagnosis not present

## 2018-08-19 DIAGNOSIS — Z8249 Family history of ischemic heart disease and other diseases of the circulatory system: Secondary | ICD-10-CM | POA: Insufficient documentation

## 2018-08-19 DIAGNOSIS — K259 Gastric ulcer, unspecified as acute or chronic, without hemorrhage or perforation: Secondary | ICD-10-CM | POA: Diagnosis not present

## 2018-08-19 DIAGNOSIS — Z87891 Personal history of nicotine dependence: Secondary | ICD-10-CM | POA: Insufficient documentation

## 2018-08-19 DIAGNOSIS — M1812 Unilateral primary osteoarthritis of first carpometacarpal joint, left hand: Secondary | ICD-10-CM | POA: Insufficient documentation

## 2018-08-19 DIAGNOSIS — M19031 Primary osteoarthritis, right wrist: Secondary | ICD-10-CM | POA: Insufficient documentation

## 2018-08-19 DIAGNOSIS — G629 Polyneuropathy, unspecified: Secondary | ICD-10-CM | POA: Diagnosis not present

## 2018-08-19 DIAGNOSIS — Z79899 Other long term (current) drug therapy: Secondary | ICD-10-CM | POA: Diagnosis not present

## 2018-08-19 HISTORY — PX: BIOPSY: SHX5522

## 2018-08-19 HISTORY — PX: ESOPHAGOGASTRODUODENOSCOPY (EGD) WITH PROPOFOL: SHX5813

## 2018-08-19 SURGERY — ESOPHAGOGASTRODUODENOSCOPY (EGD) WITH PROPOFOL
Anesthesia: Monitor Anesthesia Care

## 2018-08-19 MED ORDER — HYDROCODONE-ACETAMINOPHEN 7.5-325 MG PO TABS: 1.0000 | ORAL_TABLET | Freq: Once | ORAL | Status: DC | PRN

## 2018-08-19 MED ORDER — HYDROMORPHONE HCL 1 MG/ML IJ SOLN: 0.2500 mg | INTRAMUSCULAR | Status: DC | PRN

## 2018-08-19 MED ORDER — PROPOFOL 10 MG/ML IV BOLUS
INTRAVENOUS | Status: AC
  Filled 2018-08-19: qty 40

## 2018-08-19 MED ORDER — PROPOFOL 500 MG/50ML IV EMUL
INTRAVENOUS | Status: DC | PRN
  Administered 2018-08-19: 150 ug/kg/min via INTRAVENOUS
  Administered 2018-08-19: 15:00:00 via INTRAVENOUS

## 2018-08-19 MED ORDER — LACTATED RINGERS IV SOLN: INTRAVENOUS | Status: DC

## 2018-08-19 MED ORDER — MEPERIDINE HCL 100 MG/ML IJ SOLN: 6.2500 mg | INTRAMUSCULAR | Status: DC | PRN

## 2018-08-19 MED ORDER — MIDAZOLAM HCL 5 MG/5ML IJ SOLN
INTRAMUSCULAR | Status: DC | PRN
  Administered 2018-08-19: 2 mg via INTRAVENOUS

## 2018-08-19 MED ORDER — MIDAZOLAM HCL 2 MG/2ML IJ SOLN
INTRAMUSCULAR | Status: AC
  Filled 2018-08-19: qty 2

## 2018-08-19 MED ORDER — LACTATED RINGERS IV SOLN
INTRAVENOUS | Status: DC
  Administered 2018-08-19: 14:00:00 via INTRAVENOUS

## 2018-08-19 MED ORDER — PROMETHAZINE HCL 25 MG/ML IJ SOLN: 6.2500 mg | INTRAMUSCULAR | Status: DC | PRN

## 2018-08-19 MED ORDER — STERILE WATER FOR IRRIGATION IR SOLN
Status: DC | PRN
  Administered 2018-08-19: 100 mL

## 2018-08-19 NOTE — Op Note (Signed)
[facility] Patient Name: Seth Savage Procedure Date: 08/19/2018 2:12 PM MRN: [id number] Date of Birth: 1950-12-12 Attending MD: Seth Savage , MD CSN: [id number] Age: 67 Admit Type: Outpatient Procedure:                Upper GI endoscopy Indications:              Epigastric abdominal pain Providers:                Seth Richards, MD, Rosina Lowenstein, RN, Nelma Rothman, Technician Referring MD:              Medicines:                Propofol per Anesthesia Complications:            No immediate complications. Estimated Blood Loss:     Estimated blood loss was minimal. Procedure:                Pre-Anesthesia Assessment:                           - Prior to the procedure, a History and Physical                            was performed, and patient medications and                            allergies were reviewed. The patient's tolerance of                            previous anesthesia was also reviewed. The risks                            and benefits of the procedure and the sedation                            options and risks were discussed with the patient.                            All questions were answered, and informed consent                            was obtained. Prior Anticoagulants: The patient has                            taken no previous anticoagulant or antiplatelet                            agents. ASA Grade Assessment: II - A patient with                            mild systemic disease. After reviewing the risks  and benefits, the patient was deemed in                            satisfactory condition to undergo the procedure.                           After obtaining informed consent, the endoscope was                            passed under direct vision. Throughout the                            procedure, the patient's blood pressure, pulse, and                            oxygen  saturations were monitored continuously. The                            [id number]-H190 ([id number]) scope was introduced through the                            and advanced to the third part of duodenum. The                            upper GI endoscopy was accomplished without                            difficulty. The patient tolerated the procedure                            well. Scope In: 2:24:02 PM Scope Out: 2:32:32 PM Total Procedure Duration: 0 hours 8 minutes 30 seconds  Findings:      The examined esophagus was normal.      Scattered mild mucosal changes characterized by erythema and erosion       were found in the entire examined stomach. This was biopsied with a cold       forceps for histology. Estimated blood loss was minimal. Small hiatal       hernia. Examination of bulb second and third portion of the duodenum       revealed subtle white papular areas. No scalloping no infiltrating       process seen. This area was also biopsied. Impression:               - Normal esophagus.                           -Subtle abnormalities of the stomach and duodenum                            of doubtful clinical significance status post                            biopsy. Today's findings would not explain  patient's symptoms satisfactorily. We need to                            revisit the gallbladder fossa where fluid was seen                            previously. This would be a rather protracted,                            indolent course for a postoperative bile leak but                            at this point that needs to be kept in the                            differential. Patient reportedly had labs recently                            through Dr. Woody Seller'; I need to take a look at the end                            of make sure there is satisfactory. Moderate Sedation:      Moderate (conscious) sedation was personally administered by an       anesthesia  professional. The following parameters were monitored: oxygen       saturation, heart rate, blood pressure, respiratory rate, EKG, adequacy       of pulmonary ventilation, and response to care. Total physician       intraservice time was 12 minutes. Recommendation:           - Patient has a contact number available for                            emergencies. The signs and symptoms of potential                            delayed complications were discussed with the                            patient. Return to normal activities tomorrow.                            Written discharge instructions were provided to the                            patient.                           Right upper quadrant ultrasound to reassess                            gallbladder fossa. Review labs recently obtained.                            Course of Carafate 1  g suspension before meals and                            at bedtime. Resume Dexilant 60 mg daily in lieu of                            Protonix. Obtain samples from the office. Procedure Code(s):        --- Professional ---                           [phone], Esophagogastroduodenoscopy, flexible,                            transoral; with biopsy, single or multiple Diagnosis Code(s):        --- Professional ---                           K31.89, Other diseases of stomach and duodenum                           K25.9, Gastric ulcer, unspecified as acute or                            chronic, without hemorrhage or perforation                           R10.13, Epigastric pain CPT copyright 2017 American [employer] Association. All rights reserved. The codes documented in this report are preliminary and upon coder review may  be revised to meet current compliance requirements. Seth Savage. Rourk, MD Seth Richards, MD 08/19/2018 3:36:51 PM This report has been signed electronically. Number of Addenda: 0

## 2018-08-19 NOTE — Transfer of Care (Signed)
Immediate Anesthesia Transfer of Care Note  Patient: Seth Savage  Procedure(s) Performed: ESOPHAGOGASTRODUODENOSCOPY (EGD) WITH PROPOFOL (N/A ) BIOPSY  Patient Location: PACU  Anesthesia Type:MAC  Level of Consciousness: awake and alert   Airway & Oxygen Therapy: Patient Spontanous Breathing  Post-op Assessment: Report given to RN  Post vital signs: Reviewed and stable  Last Vitals:  Vitals Value Taken Time  BP 91/42 08/19/2018  2:40 PM  Temp    Pulse 54 08/19/2018  2:43 PM  Resp 13 08/19/2018  2:43 PM  SpO2 93 % 08/19/2018  2:43 PM  Vitals shown include unvalidated device data.  Last Pain:  Vitals:   08/19/18 1435  TempSrc:   PainSc: 0-No pain         Complications: No apparent anesthesia complications

## 2018-08-19 NOTE — Anesthesia Preprocedure Evaluation (Signed)
Anesthesia Evaluation  Patient identified by MRN, date of birth, ID band Patient awake    Reviewed: Allergy & Precautions, H&P , NPO status , Patient's Chart, lab work & pertinent test results, reviewed documented beta blocker date and time   Airway Mallampati: III  TM Distance: >3 FB Neck ROM: full    Dental no notable dental hx. (+) Teeth Intact, Dental Advidsory Given   Pulmonary neg pulmonary ROS, shortness of breath, asthma , pneumonia, resolved, COPD,  COPD inhaler, former smoker,    Pulmonary exam normal breath sounds clear to auscultation       Cardiovascular Exercise Tolerance: Good hypertension, + angina + CAD  negative cardio ROS   Rhythm:regular Rate:Normal     Neuro/Psych  Headaches, negative neurological ROS  negative psych ROS   GI/Hepatic negative GI ROS, Neg liver ROS, PUD, GERD  ,  Endo/Other  negative endocrine ROSHypothyroidism   Renal/GU Renal diseasenegative Renal ROS  negative genitourinary   Musculoskeletal   Abdominal   Peds  Hematology negative hematology ROS (+)   Anesthesia Other Findings PUD , reflux, esophagitis  Reproductive/Obstetrics negative OB ROS                             Anesthesia Physical Anesthesia Plan  ASA: III  Anesthesia Plan: MAC   Post-op Pain Management:    Induction:   PONV Risk Score and Plan:   Airway Management Planned:   Additional Equipment:   Intra-op Plan:   Post-operative Plan:   Informed Consent: I have reviewed the patients History and Physical, chart, labs and discussed the procedure including the risks, benefits and alternatives for the proposed anesthesia with the patient or authorized representative who has indicated his/her understanding and acceptance.   Dental Advisory Given  Plan Discussed with: CRNA and Anesthesiologist  Anesthesia Plan Comments:         Anesthesia Quick Evaluation

## 2018-08-19 NOTE — Discharge Instructions (Addendum)
Gastroesophageal Reflux Disease, Adult Normally, food travels down the esophagus and stays in the stomach to be digested. However, when a person has gastroesophageal reflux disease (GERD), food and stomach acid move back up into the esophagus. When this happens, the esophagus becomes sore and inflamed. Over time, GERD can create small holes (ulcers) in the lining of the esophagus. What are the causes? This condition is caused by a problem with the muscle between the esophagus and the stomach (lower esophageal sphincter, or LES). Normally, the LES muscle closes after food passes through the esophagus to the stomach. When the LES is weakened or abnormal, it does not close properly, and that allows food and stomach acid to go back up into the esophagus. The LES can be weakened by certain dietary substances, medicines, and medical conditions, including:  Tobacco use.  Pregnancy.  Having a hiatal hernia.  Heavy alcohol use.  Certain foods and beverages, such as coffee, chocolate, onions, and peppermint.  What increases the risk? This condition is more likely to develop in:  People who have an increased body weight.  People who have connective tissue disorders.  People who use NSAID medicines.  What are the signs or symptoms? Symptoms of this condition include:  Heartburn.  Difficult or painful swallowing.  The feeling of having a lump in the throat.  Abitter taste in the mouth.  Bad breath.  Having a large amount of saliva.  Having an upset or bloated stomach.  Belching.  Chest pain.  Shortness of breath or wheezing.  Ongoing (chronic) cough or a night-time cough.  Wearing away of tooth enamel.  Weight loss.  Different conditions can cause chest pain. Make sure to see your health care provider if you experience chest pain. How is this diagnosed? Your health care provider will take a medical history and perform a physical exam. To determine if you have mild or severe  GERD, your health care provider may also monitor how you respond to treatment. You may also have other tests, including:  An endoscopy toexamine your stomach and esophagus with a small camera.  A test thatmeasures the acidity level in your esophagus.  A test thatmeasures how much pressure is on your esophagus.  A barium swallow or modified barium swallow to show the shape, size, and functioning of your esophagus.  How is this treated? The goal of treatment is to help relieve your symptoms and to prevent complications. Treatment for this condition may vary depending on how severe your symptoms are. Your health care provider may recommend:  Changes to your diet.  Medicine.  Surgery.  Follow these instructions at home: Diet  Follow a diet as recommended by your health care provider. This may involve avoiding foods and drinks such as: ? Coffee and tea (with or without caffeine). ? Drinks that containalcohol. ? Energy drinks and sports drinks. ? Carbonated drinks or sodas. ? Chocolate and cocoa. ? Peppermint and mint flavorings. ? Garlic and onions. ? Horseradish. ? Spicy and acidic foods, including peppers, chili powder, curry powder, vinegar, hot sauces, and barbecue sauce. ? Citrus fruit juices and citrus fruits, such as oranges, lemons, and limes. ? Tomato-based foods, such as red sauce, chili, salsa, and pizza with red sauce. ? Fried and fatty foods, such as donuts, french fries, potato chips, and high-fat dressings. ? High-fat meats, such as hot dogs and fatty cuts of red and white meats, such as rib eye steak, sausage, ham, and bacon. ? High-fat dairy items, such as whole milk,  butter, and cream cheese.  Eat small, frequent meals instead of large meals.  Avoid drinking large amounts of liquid with your meals.  Avoid eating meals during the 2-3 hours before bedtime.  Avoid lying down right after you eat.  Do not exercise right after you eat. General  instructions  Pay attention to any changes in your symptoms.  Take over-the-counter and prescription medicines only as told by your health care provider. Do not take aspirin, ibuprofen, or other NSAIDs unless your health care provider told you to do so.  Do not use any tobacco products, including cigarettes, chewing tobacco, and e-cigarettes. If you need help quitting, ask your health care provider.  Wear loose-fitting clothing. Do not wear anything tight around your waist that causes pressure on your abdomen.  Raise (elevate) the head of your bed 6 inches (15cm).  Try to reduce your stress, such as with yoga or meditation. If you need help reducing stress, ask your health care provider.  If you are overweight, reduce your weight to an amount that is healthy for you. Ask your health care provider for guidance about a safe weight loss goal.  Keep all follow-up visits as told by your health care provider. This is important. Contact a health care provider if:  You have new symptoms.  You have unexplained weight loss.  You have difficulty swallowing, or it hurts to swallow.  You have wheezing or a persistent cough.  Your symptoms do not improve with treatment.  You have a hoarse voice. Get help right away if:  You have pain in your arms, neck, jaw, teeth, or back.  You feel sweaty, dizzy, or light-headed.  You have chest pain or shortness of breath.  You vomit and your vomit looks like blood or coffee grounds.  You faint.  Your stool is bloody or black.  You cannot swallow, drink, or eat. This information is not intended to replace advice given to you by your health care provider. Make sure you discuss any questions you have with your health care provider. Document Released: 08/16/2005 Document Revised: 04/05/2016 Document Reviewed: 03/03/2015 Elsevier Interactive Patient Education  2018 [employer]. EGD Discharge instructions Please read the instructions outlined  below and refer to this sheet in the next few weeks. These discharge instructions provide you with general information on caring for yourself after you leave the hospital. Your doctor may also give you specific instructions. While your treatment has been planned according to the most current medical practices available, unavoidable complications occasionally occur. If you have any problems or questions after discharge, please call your doctor. ACTIVITY  You may resume your regular activity but move at a slower pace for the next 24 hours.   Take frequent rest periods for the next 24 hours.   Walking will help expel (get rid of) the air and reduce the bloated feeling in your abdomen.   No driving for 24 hours (because of the anesthesia (medicine) used during the test).   You may shower.   Do not sign any important legal documents or operate any machinery for 24 hours (because of the anesthesia used during the test).  NUTRITION  Drink plenty of fluids.   You may resume your normal diet.   Begin with a light meal and progress to your normal diet.   Avoid alcoholic beverages for 24 hours or as instructed by your caregiver.  MEDICATIONS  You may resume your normal medications unless your caregiver tells you otherwise.  WHAT YOU CAN  EXPECT TODAY  You may experience abdominal discomfort such as a feeling of fullness or gas pains.  FOLLOW-UP  Your doctor will discuss the results of your test with you.  SEEK IMMEDIATE MEDICAL ATTENTION IF ANY OF THE FOLLOWING OCCUR:  Excessive nausea (feeling sick to your stomach) and/or vomiting.   Severe abdominal pain and distention (swelling).   Trouble swallowing.   Temperature over 101 F (37.8 C).   Rectal bleeding or vomiting of blood.   GERD information provided  Stop Protonix; try Dexilant 60 mg daily.  Add Carafate 1 g suspension before meals and at bedtime  Repeat ultrasound to look at the area recheck fluid seen in gallbladder  fossa after cholecystectomy   ****Seth Savage at Dr. Roseanne Kaufman office notified to schedule****  Further recommendations to follow pending review of pathology report    Gastroesophageal Reflux Disease, Adult Normally, food travels down the esophagus and stays in the stomach to be digested. If a person has gastroesophageal reflux disease (GERD), food and stomach acid move back up into the esophagus. When this happens, the esophagus becomes sore and swollen (inflamed). Over time, GERD can make small holes (ulcers) in the lining of the esophagus. Follow these instructions at home: Diet  Follow a diet as told by your doctor. You may need to avoid foods and drinks such as: ? Coffee and tea (with or without caffeine). ? Drinks that contain alcohol. ? Energy drinks and sports drinks. ? Carbonated drinks or sodas. ? Chocolate and cocoa. ? Peppermint and mint flavorings. ? Garlic and onions. ? Horseradish. ? Spicy and acidic foods, such as peppers, chili powder, curry powder, vinegar, hot sauces, and BBQ sauce. ? Citrus fruit juices and citrus fruits, such as oranges, lemons, and limes. ? Tomato-based foods, such as red sauce, chili, salsa, and pizza with red sauce. ? Fried and fatty foods, such as donuts, french fries, potato chips, and high-fat dressings. ? High-fat meats, such as hot dogs, rib eye steak, sausage, ham, and bacon. ? High-fat dairy items, such as whole milk, butter, and cream cheese.  Eat small meals often. Avoid eating large meals.  Avoid drinking large amounts of liquid with your meals.  Avoid eating meals during the 2-3 hours before bedtime.  Avoid lying down right after you eat.  Do not exercise right after you eat. General instructions  Pay attention to any changes in your symptoms.  Take over-the-counter and prescription medicines only as told by your doctor. Do not take aspirin, ibuprofen, or other NSAIDs unless your doctor says it is okay.  Do not use any tobacco  products, including cigarettes, chewing tobacco, and e-cigarettes. If you need help quitting, ask your doctor.  Wear loose clothes. Do not wear anything tight around your waist.  Raise (elevate) the head of your bed about 6 inches (15 cm).  Try to lower your stress. If you need help doing this, ask your doctor.  If you are overweight, lose an amount of weight that is healthy for you. Ask your doctor about a safe weight loss goal.  Keep all follow-up visits as told by your doctor. This is important. Contact a doctor if:  You have new symptoms.  You lose weight and you do not know why it is happening.  You have trouble swallowing, or it hurts to swallow.  You have wheezing or a cough that keeps happening.  Your symptoms do not get better with treatment.  You have a hoarse voice. Get help right away if:  You  have pain in your arms, neck, jaw, teeth, or back.  You feel sweaty, dizzy, or light-headed.  You have chest pain or shortness of breath.  You throw up (vomit) and your throw up looks like blood or coffee grounds.  You pass out (faint).  Your poop (stool) is bloody or black.  You cannot swallow, drink, or eat. This information is not intended to replace advice given to yo Monitored Anesthesia Care, Care After These instructions provide you with information about caring for yourself after your procedure. Your health care provider may also give you more specific instructions. Your treatment has been planned according to current medical practices, but problems sometimes occur. Call your health care provider if you have any problems or questions after your procedure. What can I expect after the procedure? After your procedure, it is common to:  Feel sleepy for several hours.  Feel clumsy and have poor balance for several hours.  Feel forgetful about what happened after the procedure.  Have poor judgment for several hours.  Feel nauseous or vomit.  Have a sore  throat if you had a breathing tube during the procedure.  Follow these instructions at home: For at least 24 hours after the procedure:   Do not: ? Participate in activities in which you could fall or become injured. ? Drive. ? Use heavy machinery. ? Drink alcohol. ? Take sleeping pills or medicines that cause drowsiness. ? Make important decisions or sign legal documents. ? Take care of children on your own.  Rest. Eating and drinking  Follow the diet that is recommended by your health care provider.  If you vomit, drink water, juice, or soup when you can drink without vomiting.  Make sure you have little or no nausea before eating solid foods. General instructions  Have a responsible adult stay with you until you are awake and alert.  Take over-the-counter and prescription medicines only as told by your health care provider.  If you smoke, do not smoke without supervision.  Keep all follow-up visits as told by your health care provider. This is important. Contact a health care provider if:  You keep feeling nauseous or you keep vomiting.  You feel light-headed.  You develop a rash.  You have a fever. Get help right away if:  You have trouble breathing. This information is not intended to replace advice given to you by your health care provider. Make sure you discuss any questions you have with your health care provider. Document Released: 02/27/2016 Document Revised: 06/28/2016 Document Reviewed: 02/27/2016 Elsevier Interactive Patient Education  2018 [employer]. u by your health care provider. Make sure you discuss any questions you have with your health care provider. Document Released: 04/24/2008 Document Revised: 04/13/2016 Document Reviewed: 03/03/2015 Elsevier Interactive Patient Education  [employer].

## 2018-08-19 NOTE — Interval H&P Note (Signed)
History and Physical Interval Note:  08/19/2018 2:10 PM  Seth Savage  has presented today for surgery, with the diagnosis of epigastric/retro-xyphoid pain  The various methods of treatment have been discussed with the patient and family. After consideration of risks, benefits and other options for treatment, the patient has consented to  Procedure(s) with comments: ESOPHAGOGASTRODUODENOSCOPY (EGD) WITH PROPOFOL (N/A) - 2:45pm as a surgical intervention .  The patient's history has been reviewed, patient examined, no change in status, stable for surgery.  I have reviewed the patient's chart and labs.  Questions were answered to the patient's satisfaction.     Manus Rudd  Patient without change.  No dysphagia.  Diagnostic EGD per plan.  The risks, benefits, limitations, alternatives and imponderables have been reviewed with the patient. Potential for esophageal dilation, biopsy, etc. have also been reviewed.  Questions have been answered. All parties agreeable.

## 2018-08-19 NOTE — Anesthesia Postprocedure Evaluation (Signed)
Anesthesia Post Note  Patient: Seth Savage  Procedure(s) Performed: ESOPHAGOGASTRODUODENOSCOPY (EGD) WITH PROPOFOL (N/A ) BIOPSY  Patient location during evaluation: PACU Anesthesia Type: MAC Level of consciousness: awake Pain management: pain level controlled Vital Signs Assessment: post-procedure vital signs reviewed and stable Respiratory status: spontaneous breathing Cardiovascular status: blood pressure returned to baseline and stable Postop Assessment: no apparent nausea or vomiting Anesthetic complications: no     Last Vitals:  Vitals:   08/19/18 1336 08/19/18 1440  BP: 137/76   Pulse: 60   Resp: 20   Temp: 36.4 C 36.8 C  SpO2: 97%     Last Pain:  Vitals:   08/19/18 1435  TempSrc:   PainSc: 0-No pain                 Grayden Burley

## 2018-08-19 NOTE — Telephone Encounter (Signed)
Christine at [facility] called office, RMR wants pt to have [place] abd RUQ to assess gallbladder fossa where fluid was seen previously after cholecystectomy. [place] scheduled for 08/28/18 at 10:30am, arrive at 10:15am. NPO after midnight prior to test.  Tried to call pt, no answer, LMOAM for return call.

## 2018-08-20 ENCOUNTER — Telehealth: Payer: Self-pay | Admitting: Internal Medicine

## 2018-08-20 NOTE — Telephone Encounter (Signed)
Spoke with pts spouse. Dexilant samples are suppose to arrive Thursday and pt will notified when samples are ready for pickup.

## 2018-08-20 NOTE — Telephone Encounter (Signed)
Tried to call pt, no answer, LMOAM for return call. 

## 2018-08-20 NOTE — Telephone Encounter (Signed)
Pt's wife called to say that RMR told her that the Dexilant Rep was coming today and she was going to stop by to pick up samples. I told her there were no drug reps on the book today and I would let the nurse know that when they do come in to have samples for patient. [phone] or [phone]

## 2018-08-20 NOTE — Telephone Encounter (Signed)
Tried to call pt, he was unavailable. Spoke to his wife. She was aware of [place] appt. Letter mailed.

## 2018-08-22 ENCOUNTER — Encounter (HOSPITAL_COMMUNITY): Payer: Self-pay | Admitting: Internal Medicine

## 2018-08-22 NOTE — Telephone Encounter (Signed)
Samples are ready for pt. Pt is aware.

## 2018-08-23 ENCOUNTER — Telehealth: Payer: Self-pay

## 2018-08-23 NOTE — Telephone Encounter (Signed)
Pt's wife [name] left a Vm that pt recently found out his brother had some similar problems that he has at night.  She would like to discuss with [place] and see if Dr. Gala Romney thinks this might shed some light on pt. I called and left Vm for a return call to discuss.

## 2018-08-26 NOTE — Telephone Encounter (Signed)
Pts spouse called this morning to discuss her husbands reflux. She states that no PPI works for pt. He is currently on Dexilant. She feels his flares stop after he's at home since he is able to sit up. When pt is working, spouse says he is bending over and it causes his flares. She also wanted RMR to be aware that pts brother had the same symptoms as the pt and he was always told he was ok until he had a flare at the ED. 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 When pts brother went to his cardiologist, nothing was found. Pt was advised to share this info with the pts cardiologist.

## 2018-08-27 ENCOUNTER — Telehealth: Payer: Self-pay | Admitting: Cardiovascular Disease

## 2018-08-27 NOTE — Telephone Encounter (Signed)
Wife walked in asking to speak with Dr Bronson Ing or nurse in reference to her husband   At night feels like a knife is stabbing him in the middle of his chest.  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

## 2018-08-27 NOTE — Telephone Encounter (Signed)
Per wife, patient symptoms of chest pain has been going on for the past year but has gotten worse more recently rated 10/10. Wife said that patient describes the pain as sharp. Per wife, the chest pain comes and goes throughout the day and at night. Wife says that patient also has dizziness that has been going on since the chest pain started for the past year but says the dizziness has not gotten any worse. Patient works 15 hours/week and left work early on yesterday due to the chest pain. Per wife, patient's GI doctor told patient his chest pain isn't acid reflux. 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 Patient does not have nitroglycerin. First available appointment given to patient to be seen by Dr. Raliegh Ip on 09/12/18 @2 :00 pm [place] office. Wife advise that patient should contact his pcp for an appointment as well and if his pcp feels he needs a sooner appt with the cardiologist, to have their office contact us for a sooner appt. Wife also advised that if symptoms get worse, to have patient go to the ED for an evaluation. Verbalized understanding of plan.

## 2018-08-28 ENCOUNTER — Ambulatory Visit (HOSPITAL_COMMUNITY)
Admission: RE | Admit: 2018-08-28 | Discharge: 2018-08-28 | Disposition: A | Discharge status: Home / Self Care | Payer: Medicare Other | Source: Ambulatory Visit | Attending: Internal Medicine | Admitting: Internal Medicine

## 2018-08-28 DIAGNOSIS — R1013 Epigastric pain: Secondary | ICD-10-CM | POA: Diagnosis not present

## 2018-08-29 ENCOUNTER — Encounter: Payer: Self-pay | Admitting: Internal Medicine

## 2018-08-29 NOTE — Telephone Encounter (Signed)
Cardiac etiology to his symptoms need to be ruled out first.  He did see the cardiologist and sounds like he had a thorough cardiac evaluation.  We are not really finding anything from a GI standpoint.   He should supply his cardiologist with andy any useful history to facilitate patient's evaluation.

## 2018-08-30 NOTE — Telephone Encounter (Signed)
Noted. Pt is aware and knows they will follow up with "Cardiology.

## 2018-09-12 ENCOUNTER — Encounter: Payer: Self-pay | Admitting: Cardiovascular Disease

## 2018-09-12 ENCOUNTER — Ambulatory Visit: Payer: Medicare Other | Admitting: Cardiovascular Disease

## 2018-09-12 VITALS — BP 112/60 | HR 67 | Ht 72.0 in | Wt 231.0 lb

## 2018-09-12 DIAGNOSIS — R079 Chest pain, unspecified: Secondary | ICD-10-CM

## 2018-09-12 DIAGNOSIS — I10 Essential (primary) hypertension: Secondary | ICD-10-CM | POA: Diagnosis not present

## 2018-09-12 DIAGNOSIS — I472 Ventricular tachycardia, unspecified: Secondary | ICD-10-CM

## 2018-09-12 DIAGNOSIS — E785 Hyperlipidemia, unspecified: Secondary | ICD-10-CM | POA: Diagnosis not present

## 2018-09-12 MED ORDER — AMLODIPINE BESYLATE 5 MG PO TABS: 5.0000 mg | ORAL_TABLET | Freq: Every day | ORAL | 3 refills | Status: DC

## 2018-09-12 MED ORDER — LOSARTAN POTASSIUM 50 MG PO TABS: 50.0000 mg | ORAL_TABLET | Freq: Every day | ORAL | 3 refills | Status: DC

## 2018-09-12 MED ORDER — ISOSORBIDE DINITRATE 10 MG PO TABS: 10.0000 mg | ORAL_TABLET | Freq: Three times a day (TID) | ORAL | 3 refills | Status: DC

## 2018-09-12 NOTE — Patient Instructions (Signed)
Your physician recommends that you schedule a follow-up appointment in:  3-4 MONTHS WITH DR.KONESWARAN    STOP HCTZ   START Amlodipine 5 mg daily   DECREASE Cozaar to 50 mg daily   Take Isordil 10 mg three times a day       If you need a refill on your cardiac medications before your next appointment, please call your pharmacy.    No lab work or tests ordered today

## 2018-09-12 NOTE — Progress Notes (Addendum)
SUBJECTIVE: The patient presents for evaluation of chest pain.  His wife called our office on 08/27/2018 saying that the patient has been having chest pain for the past year but it had gotten worse recently.  He also has dizziness which is been going on for the past year but this has not gotten any worse.  He was apparently evaluated by GI and they did not feel symptoms were related to GERD.  He underwent cardiac catheterization in April 2019 and was found to have nonobstructive coronary disease and grossly normal left ventricular systolic function.  Stenoses were in the range of 30% for the most part primarily in the proximal RCA, ostial first and second marginal branches, and ostial to proximal LAD.  He developed ventricular tachycardia after undergoing laparoscopic cholecystectomy and underwent chest compressions which restored sinus rhythm.  He wore a 30-day event monitor which demonstrated sinus rhythm and sinus bradycardia with rare PVCs.  There were no significant arrhythmias.  Echocardiogram on 06/27/2018 demonstrated normal left ventricular systolic function and regional wall motion, LVEF 55 to 60%, with grade 1 diastolic dysfunction and mild mitral regurgitation.  He saw Gerrianne Scale PA-C on 07/01/2018 and continued to complain of chronic chest pain and described reflux symptoms.  We had a long discussion about his chest pain.  He denies exertional chest pain and exertional dyspnea.  He said it is almost like clockwork and that it always occurs between 3 and 4 AM and awakens him from sleep.  He describes it as sharp and "like a sword going through me ".  He denies palpitations and leg swelling.  He quit smoking and feels like his breathing has improved considerably.  He previously underwent a cholecystectomy.  He takes Protonix.  He splits his Isordil tablet in half and takes it 3 hours apart due to severe headaches.  He told me that his brother has Prinzmetal's angina.  ECG performed  in the office today which I ordered and personally interpreted demonstrates sinus bradycardia with no ischemic ST segment or T-wave abnormalities, nor any arrhythmias.    Review of Systems: As per "subjective", otherwise negative.  Allergies  Allergen Reactions  . Lyrica [Pregabalin] Shortness Of Breath  . Celecoxib Other (See Comments)    GI BLEED, FATIGUE  . Codeine Itching    Current Outpatient Medications  Medication Sig Dispense Refill  . aspirin EC 81 MG tablet Take 81 mg by mouth at bedtime.     Marland Kitchen atorvastatin (LIPITOR) 10 MG tablet Take 1 tablet (10 mg total) by mouth daily. 30 tablet 11  . buPROPion (WELLBUTRIN SR) 150 MG 12 hr tablet Take 150 mg by mouth 2 (two) times daily.     . Ca Carbonate-Mag Hydroxide (ROLAIDS PO) Take by mouth as needed.    Marland Kitchen CARAFATE 1 GM/10ML suspension TAKE 10 ML BY MOUTH BEFORE MEALS AND AT BEDTIME  1  . fluticasone (FLONASE) 50 MCG/ACT nasal spray Place 2 sprays into both nostrils daily as needed for allergies.     . hydrochlorothiazide (HYDRODIURIL) 12.5 MG tablet Take 12.5 mg by mouth daily.     . isosorbide dinitrate (ISORDIL) 20 MG tablet Take 20 mg by mouth daily.  3  . levothyroxine (SYNTHROID, LEVOTHROID) 150 MCG tablet Take 150 mcg by mouth daily before breakfast.     . losartan (COZAAR) 100 MG tablet Take 100 mg by mouth daily.     . metoprolol succinate (TOPROL XL) 25 MG 24 hr tablet Take 1  tablet (25 mg total) by mouth daily. 90 tablet 0  . pantoprazole (PROTONIX) 40 MG tablet Take 40 mg by mouth daily.     No current facility-administered medications for this visit.     Past Medical History:  Diagnosis Date  . Arthritis    "wrists, thumb on left hand" (03/12/2018)  . Back injury 2003   "broke my back in 2 places; never had any OR for it" (03/12/2018)  . Carotid artery occlusion   . Childhood asthma   . COPD (chronic obstructive pulmonary disease) ([place])   . Essential hypertension   . GERD (gastroesophageal reflux disease)   .  Goiter    Radioactive iodine treatment  . H pylori ulcer Jan 2016   Treated with Prevpac  . History of bronchitis   . History of kidney stones   . Hypercholesterolemia   . Hypertension   . Hypothyroid   . Migraine    "been about 2 yr w/out a headache" (03/12/2018)  . Neuropathy   . Pneumonia 1969  . Shortness of breath dyspnea    with exertion  . Wears glasses     Past Surgical History:  Procedure Laterality Date  . BIOPSY  05/01/2017   Procedure: BIOPSY;  Surgeon: Daneil Dolin, MD;  [facility];  Service: Endoscopy;;  gastric  . BIOPSY  08/19/2018   Procedure: BIOPSY;  Surgeon: Daneil Dolin, MD;  [facility];  Service: Endoscopy;;  duodenal and gastric  . CARDIAC CATHETERIZATION N/A 07/05/2016   Procedure: Left Heart Cath and Coronary Angiography;  Surgeon: Burnell Blanks, MD;  Location: [place] [facility];  Service: Cardiovascular;  Laterality: N/A;  . CARDIAC CATHETERIZATION  03/12/2018  . CAROTID ENDARTERECTOMY Left ?2009  . CARPAL TUNNEL RELEASE Right   . CHOLECYSTECTOMY N/A 04/24/2018   Procedure: LAPAROSCOPIC CHOLECYSTECTOMY;  Surgeon: Aviva Signs, MD;  Location: [facility];  Service: General;  Laterality: N/A;  . COLECTOMY  2000   Secondary to intussuception  . COLONOSCOPY  08/24/2004   Normal rectum,Small polyp at 25 cm, cold snared/ The remainder of the colonic mucosa appeared normal  . COLONOSCOPY  09/24/2012   Dr. Mike Craze hemorrhoids-likely source of hematochezia.Colonic diverticulosis  . COLONOSCOPY N/A 05/01/2017   Dr. Gala Romney: Diverticulosis, next colonoscopy in 5 years.  Marland Kitchen ENDARTERECTOMY Right 07/07/2016   Procedure: ENDARTERECTOMY CAROTID;  Surgeon: Rosetta Posner, MD;  Location: [facility];  Service: Vascular;  Laterality: Right;  . ESOPHAGOGASTRODUODENOSCOPY N/A 12/09/2014   Dr. Gala Romney: Erosive reflux esophagitis. Peptic Ulcer disease secondary to H.pylori. small hiatal hernia. Nonbleeding duodenal AVM. Duodenal erosions. TREATED  WITH PREVPAC  . ESOPHAGOGASTRODUODENOSCOPY N/A 03/15/2015   Dr. Gala Romney: small hiatal hernia, healed PUD, duodenal AVM.   Marland Kitchen ESOPHAGOGASTRODUODENOSCOPY N/A 05/01/2017   Dr. Gala Romney: LA grade a esophagitis, gastritis, no H pylori  . ESOPHAGOGASTRODUODENOSCOPY (EGD) WITH PROPOFOL N/A 08/19/2018   Procedure: ESOPHAGOGASTRODUODENOSCOPY (EGD) WITH PROPOFOL;  Surgeon: Daneil Dolin, MD;  [facility];  Service: Endoscopy;  Laterality: N/A;  2:45pm  . FRACTURE SURGERY    . LEFT HEART CATH AND CORONARY ANGIOGRAPHY N/A 03/12/2018   Procedure: LEFT HEART CATH AND CORONARY ANGIOGRAPHY;  Surgeon: Burnell Blanks, MD;  Location: [place] [facility];  Service: Cardiovascular;  Laterality: N/A;  . Multiple bilateral arm/wrist surgeries after falls    . ORIF SHOULDER FRACTURE Left 06/2002   "shattered; put a bunch of metal plates in it"  . PATCH ANGIOPLASTY Right 07/07/2016   Procedure: PATCH ANGIOPLASTY  USING 0.8CM X 7.6CM HEMASHIELD PATCH;  Surgeon: Rosetta Posner, MD;  Location: [place];  Service: Vascular;  Laterality: Right;  . SHOULDER ARTHROSCOPY W/ ROTATOR CUFF REPAIR Right 1999  . SHOULDER OPEN ROTATOR CUFF REPAIR Left 1992   Metal implant     Social History   Socioeconomic History  . Marital status: Married    Spouse name: [name]  . Number of children: 0  . Years of education: Ged  . Highest education level: Not on file  Occupational History    Comment: Disabled  Social Needs  . Financial resource strain: Not on file  . Food insecurity:    Worry: Not on file    Inability: Not on file  . Transportation needs:    Medical: Not on file    Non-medical: Not on file  Tobacco Use  . Smoking status: Former Smoker    Packs/day: 0.20    Years: 45.00    Pack years: 9.00    Types: Cigarettes    Last attempt to quit: 12/21/2017    Years since quitting: 0.7  . Smokeless tobacco: Never Used  Substance and Sexual Activity  . Alcohol use: Not Currently    Alcohol/week: 0.0 standard  drinks  . Drug use: No  . Sexual activity: Not Currently  Lifestyle  . Physical activity:    Days per week: Not on file    Minutes per session: Not on file  . Stress: Not on file  Relationships  . Social connections:    Talks on phone: Not on file    Gets together: Not on file    Attends religious service: Not on file    Active member of club or organization: Not on file    Attends meetings of clubs or organizations: Not on file    Relationship status: Not on file  . Intimate partner violence:    Fear of current or ex partner: Not on file    Emotionally abused: Not on file    Physically abused: Not on file    Forced sexual activity: Not on file  Other Topics Concern  . Not on file  Social History Narrative   Patient lives at home with his wife. [facility]).    Disabled.   Right handed.   Caffeine- two cups daily.   Three step children.     Vitals:   09/12/18 1408  BP: 112/60  Pulse: 67  SpO2: 98%  Weight: 231 lb (104.8 kg)  Height: 6' (1.829 m)    Wt Readings from Last 3 Encounters:  09/12/18 231 lb (104.8 kg)  08/16/18 220 lb 9.6 oz (100.1 kg)  07/01/18 224 lb 6.4 oz (101.8 kg)     PHYSICAL EXAM General: NAD HEENT: Normal. Neck: No JVD, no thyromegaly. Lungs: Clear to auscultation bilaterally with normal respiratory effort. CV: Regular rate and rhythm, normal S1/S2, no S3/S4, no murmur. No pretibial or periankle edema.  No carotid bruit.   Abdomen: Soft, nontender, no distention.  Neurologic: Alert and oriented.  Psych: Normal affect. Skin: Normal. Musculoskeletal: No gross deformities.    ECG: Reviewed above under Subjective   Labs: Lab Results  Component Value Date/Time   K 4.2 04/25/2018 04:30 AM   BUN 16 04/25/2018 04:30 AM   CREATININE 1.14 04/25/2018 04:30 AM   CREATININE 1.03 02/07/2017 09:20 AM   ALT 50 04/25/2018 04:30 AM   HGB 13.6 04/25/2018 04:30 AM     Lipids: Lab Results  Component Value Date/Time   LDLCALC 47  03/13/2018 02:56  AM   CHOL 119 03/13/2018 02:56 AM   TRIG 213 (H) 03/13/2018 02:56 AM   HDL 29 (L) 03/13/2018 02:56 AM       ASSESSMENT AND PLAN: 1.  Chest pain: Unclear if symptoms represent Prinzmetal's angina but by history, it does occur between 3 and 4 AM and Prinzmetal's angina often occurs at rest or while asleep.  He has normal left ventricular systolic function and nonobstructive coronary artery disease.    He is taking Isordil 10 mg followed by an additional 10 mg 3 hours apart due to headaches.  I will have him take Isordil 10 mg 3 times daily.  I will also stop hydrochlorothiazide and reduce losartan to 50 mg and start amlodipine 5 mg daily to see if this will work in concert with Isordil to alleviate symptoms.  2.  Ventricular tachycardia: This occurred in the postoperative setting and may have been related to neostigmine.  There is documentation in the literature of ventricular tachycardia occurring after given reversal agents for neostigmine which also occurred in the setting. 30-day event monitor did not demonstrate any significant arrhythmias.  He denies palpitations.  He is on a beta-blocker.  Left ventricular systolic function is normal and he has nonobstructive coronary artery disease.  No further cardiac testing is indicated at this time.  3.  Hypercholesterolemia: Takes atorvastatin 10 mg daily.  4.  Hypertension: Blood pressure is normal.  Please see discussion and #1 with respect to medication adjustments.   Disposition: Follow up in 3 months  Time spent: 40 minutes, of which greater than 50% was spent reviewing symptoms, relevant blood tests and studies, and discussing management plan with the patient.  Kate Sable, M.D., F.A.C.C.

## 2018-11-08 ENCOUNTER — Ambulatory Visit: Payer: Medicare Other | Admitting: Cardiovascular Disease

## 2018-12-23 ENCOUNTER — Encounter: Payer: Self-pay | Admitting: Cardiovascular Disease

## 2018-12-23 ENCOUNTER — Ambulatory Visit: Payer: Medicare Other | Admitting: Cardiovascular Disease

## 2018-12-23 VITALS — BP 134/74 | HR 67 | Ht 72.0 in | Wt 224.0 lb

## 2018-12-23 DIAGNOSIS — I10 Essential (primary) hypertension: Secondary | ICD-10-CM | POA: Diagnosis not present

## 2018-12-23 DIAGNOSIS — I472 Ventricular tachycardia, unspecified: Secondary | ICD-10-CM

## 2018-12-23 DIAGNOSIS — R079 Chest pain, unspecified: Secondary | ICD-10-CM | POA: Diagnosis not present

## 2018-12-23 DIAGNOSIS — E7800 Pure hypercholesterolemia, unspecified: Secondary | ICD-10-CM

## 2018-12-23 MED ORDER — ISOSORBIDE DINITRATE 20 MG PO TABS: 20.0000 mg | ORAL_TABLET | Freq: Three times a day (TID) | ORAL | 3 refills | Status: DC

## 2018-12-23 NOTE — Patient Instructions (Signed)
Medication Instructions:  INCREASE Isordil to 20 mg three times a day If you need a refill on your cardiac medications before your next appointment, please call your pharmacy.   Lab work: None today If you have labs (blood work) drawn today and your tests are completely normal, you will receive your results only by: Marland Kitchen MyChart Message (if you have MyChart) OR . A paper copy in the mail If you have any lab test that is abnormal or we need to change your treatment, we will call you to review the results.  Testing/Procedures: None today  Follow-Up: At [facility], you and your health needs are our priority.  As part of our continuing mission to provide you with exceptional heart care, we have created designated Provider Care Teams.  These Care Teams include your primary Cardiologist (physician) and Advanced Practice Providers (APPs -  Physician Assistants and Nurse Practitioners) who all work together to provide you with the care you need, when you need it. You will need a follow up appointment in 6 months.  Please call our office 2 months in advance to schedule this appointment.  You may see Kate Sable, MD or one of the following Advanced Practice Providers on your designated Care Team:   Bernerd Pho, PA-C [facility]) . Ermalinda Barrios, PA-C ([place])  Any Other Special Instructions Will Be Listed Below (If Applicable). None

## 2018-12-23 NOTE — Progress Notes (Signed)
SUBJECTIVE: The patient presents for follow-up of chest pain.  He has nonobstructive coronary artery disease.  Since starting amlodipine and Isordil at his last visit, symptoms are less frequent.  His brother apparently has a similar chest pain syndrome.  His wife who is with him today tells me that he can get angry very easily.  Being on Wellbutrin has helped.  Used to do quite a bit of walking outdoors but now primarily walks at work.  Blood pressures at home run in the 130/72 range.  An episode of chest pain may occur at 5 AM and both arms feel numb.  He takes his medications and symptoms resolved within 10 minutes.  He also has a history of cluster migraines.  He initially had some headaches when he began Isordil but these have since resolved.    Review of Systems: As per "subjective", otherwise negative.  Allergies  Allergen Reactions  . Lyrica [Pregabalin] Shortness Of Breath  . Celecoxib Other (See Comments)    GI BLEED, FATIGUE  . Codeine Itching    Current Outpatient Medications  Medication Sig Dispense Refill  . amLODipine (NORVASC) 5 MG tablet Take 1 tablet (5 mg total) by mouth daily. 90 tablet 3  . aspirin EC 81 MG tablet Take 81 mg by mouth at bedtime.     Marland Kitchen atorvastatin (LIPITOR) 10 MG tablet Take 1 tablet (10 mg total) by mouth daily. 30 tablet 11  . buPROPion (WELLBUTRIN SR) 150 MG 12 hr tablet Take 150 mg by mouth 2 (two) times daily.     . Ca Carbonate-Mag Hydroxide (ROLAIDS PO) Take by mouth as needed.    Marland Kitchen CARAFATE 1 GM/10ML suspension TAKE 10 ML BY MOUTH BEFORE MEALS AND AT BEDTIME  1  . fluticasone (FLONASE) 50 MCG/ACT nasal spray Place 2 sprays into both nostrils daily as needed for allergies.     . hydrochlorothiazide (HYDRODIURIL) 12.5 MG tablet Take 12.5 mg by mouth daily.    . isosorbide dinitrate (ISORDIL) 10 MG tablet Take 1 tablet (10 mg total) by mouth 3 (three) times daily. 270 tablet 3  . levothyroxine (SYNTHROID, LEVOTHROID) 150 MCG  tablet Take 150 mcg by mouth daily before breakfast.     . losartan (COZAAR) 50 MG tablet Take 1 tablet (50 mg total) by mouth daily. 90 tablet 3  . metoprolol succinate (TOPROL XL) 25 MG 24 hr tablet Take 1 tablet (25 mg total) by mouth daily. 90 tablet 0  . pantoprazole (PROTONIX) 40 MG tablet Take 40 mg by mouth daily.     No current facility-administered medications for this visit.     Past Medical History:  Diagnosis Date  . Arthritis    "wrists, thumb on left hand" (03/12/2018)  . Back injury 2003   "broke my back in 2 places; never had any OR for it" (03/12/2018)  . Carotid artery occlusion   . Childhood asthma   . COPD (chronic obstructive pulmonary disease) ([place])   . Essential hypertension   . GERD (gastroesophageal reflux disease)   . Goiter    Radioactive iodine treatment  . H pylori ulcer Jan 2016   Treated with Prevpac  . History of bronchitis   . History of kidney stones   . Hypercholesterolemia   . Hypertension   . Hypothyroid   . Migraine    "been about 2 yr w/out a headache" (03/12/2018)  . Neuropathy   . Pneumonia 1969  . Shortness of breath dyspnea  with exertion  . Wears glasses     Past Surgical History:  Procedure Laterality Date  . BIOPSY  05/01/2017   Procedure: BIOPSY;  Surgeon: Daneil Dolin, MD;  [facility];  Service: Endoscopy;;  gastric  . BIOPSY  08/19/2018   Procedure: BIOPSY;  Surgeon: Daneil Dolin, MD;  [facility];  Service: Endoscopy;;  duodenal and gastric  . CARDIAC CATHETERIZATION N/A 07/05/2016   Procedure: Left Heart Cath and Coronary Angiography;  Surgeon: Burnell Blanks, MD;  Location: [place] [facility];  Service: Cardiovascular;  Laterality: N/A;  . CARDIAC CATHETERIZATION  03/12/2018  . CAROTID ENDARTERECTOMY Left ?2009  . CARPAL TUNNEL RELEASE Right   . CHOLECYSTECTOMY N/A 04/24/2018   Procedure: LAPAROSCOPIC CHOLECYSTECTOMY;  Surgeon: Aviva Signs, MD;  [facility];  Service: General;   Laterality: N/A;  . COLECTOMY  2000   Secondary to intussuception  . COLONOSCOPY  08/24/2004   Normal rectum,Small polyp at 25 cm, cold snared/ The remainder of the colonic mucosa appeared normal  . COLONOSCOPY  09/24/2012   Dr. Mike Craze hemorrhoids-likely source of hematochezia.Colonic diverticulosis  . COLONOSCOPY N/A 05/01/2017   Dr. Gala Romney: Diverticulosis, next colonoscopy in 5 years.  Marland Kitchen ENDARTERECTOMY Right 07/07/2016   Procedure: ENDARTERECTOMY CAROTID;  Surgeon: Rosetta Posner, MD;  Location: [facility];  Service: Vascular;  Laterality: Right;  . ESOPHAGOGASTRODUODENOSCOPY N/A 12/09/2014   Dr. Gala Romney: Erosive reflux esophagitis. Peptic Ulcer disease secondary to H.pylori. small hiatal hernia. Nonbleeding duodenal AVM. Duodenal erosions. TREATED WITH PREVPAC  . ESOPHAGOGASTRODUODENOSCOPY N/A 03/15/2015   Dr. Gala Romney: small hiatal hernia, healed PUD, duodenal AVM.   Marland Kitchen ESOPHAGOGASTRODUODENOSCOPY N/A 05/01/2017   Dr. Gala Romney: LA grade a esophagitis, gastritis, no H pylori  . ESOPHAGOGASTRODUODENOSCOPY (EGD) WITH PROPOFOL N/A 08/19/2018   Procedure: ESOPHAGOGASTRODUODENOSCOPY (EGD) WITH PROPOFOL;  Surgeon: Daneil Dolin, MD;  [facility];  Service: Endoscopy;  Laterality: N/A;  2:45pm  . FRACTURE SURGERY    . LEFT HEART CATH AND CORONARY ANGIOGRAPHY N/A 03/12/2018   Procedure: LEFT HEART CATH AND CORONARY ANGIOGRAPHY;  Surgeon: Burnell Blanks, MD;  Location: [place] [facility];  Service: Cardiovascular;  Laterality: N/A;  . Multiple bilateral arm/wrist surgeries after falls    . ORIF SHOULDER FRACTURE Left 06/2002   "shattered; put a bunch of metal plates in it"  . PATCH ANGIOPLASTY Right 07/07/2016   Procedure: PATCH ANGIOPLASTY USING 0.8CM X 7.6CM HEMASHIELD PATCH;  Surgeon: Rosetta Posner, MD;  Location: [place];  Service: Vascular;  Laterality: Right;  . SHOULDER ARTHROSCOPY W/ ROTATOR CUFF REPAIR Right 1999  . SHOULDER OPEN ROTATOR CUFF REPAIR Left 1992   Metal implant      Social History   Socioeconomic History  . Marital status: Married    Spouse name: [name]  . Number of children: 0  . Years of education: Ged  . Highest education level: Not on file  Occupational History    Comment: Disabled  Social Needs  . Financial resource strain: Not on file  . Food insecurity:    Worry: Not on file    Inability: Not on file  . Transportation needs:    Medical: Not on file    Non-medical: Not on file  Tobacco Use  . Smoking status: Former Smoker    Packs/day: 0.20    Years: 45.00    Pack years: 9.00    Types: Cigarettes    Last attempt to quit: 12/21/2017    Years since quitting:  1.0  . Smokeless tobacco: Never Used  Substance and Sexual Activity  . Alcohol use: Not Currently    Alcohol/week: 0.0 standard drinks  . Drug use: No  . Sexual activity: Not Currently  Lifestyle  . Physical activity:    Days per week: Not on file    Minutes per session: Not on file  . Stress: Not on file  Relationships  . Social connections:    Talks on phone: Not on file    Gets together: Not on file    Attends religious service: Not on file    Active member of club or organization: Not on file    Attends meetings of clubs or organizations: Not on file    Relationship status: Not on file  . Intimate partner violence:    Fear of current or ex partner: Not on file    Emotionally abused: Not on file    Physically abused: Not on file    Forced sexual activity: Not on file  Other Topics Concern  . Not on file  Social History Narrative   Patient lives at home with his wife. [facility]).    Disabled.   Right handed.   Caffeine- two cups daily.   Three step children.     Vitals:   12/23/18 0812  BP: 134/74  Pulse: 67  SpO2: 95%  Weight: 224 lb (101.6 kg)  Height: 6' (1.829 m)    Wt Readings from Last 3 Encounters:  12/23/18 224 lb (101.6 kg)  09/12/18 231 lb (104.8 kg)  08/16/18 220 lb 9.6 oz (100.1 kg)     PHYSICAL EXAM General: NAD HEENT:  Normal. Neck: No JVD, no thyromegaly. Lungs: Clear to auscultation bilaterally with normal respiratory effort. CV: Regular rate and rhythm, normal S1/S2, no S3/S4, no murmur. No pretibial or periankle edema.  No carotid bruit.   Abdomen: Soft, nontender, no distention.  Neurologic: Alert and oriented.  Psych: Normal affect. Skin: Normal. Musculoskeletal: No gross deformities.    ECG: Reviewed above under Subjective   Labs: Lab Results  Component Value Date/Time   K 4.2 04/25/2018 04:30 AM   BUN 16 04/25/2018 04:30 AM   CREATININE 1.14 04/25/2018 04:30 AM   CREATININE 1.03 02/07/2017 09:20 AM   ALT 50 04/25/2018 04:30 AM   HGB 13.6 04/25/2018 04:30 AM     Lipids: Lab Results  Component Value Date/Time   LDLCALC 47 03/13/2018 02:56 AM   CHOL 119 03/13/2018 02:56 AM   TRIG 213 (H) 03/13/2018 02:56 AM   HDL 29 (L) 03/13/2018 02:56 AM       ASSESSMENT AND PLAN: 1.  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  He has normal left ventricular systolic function and nonobstructive coronary artery disease.  I will increase Isordil to 20 mg three times daily.  I will continue amlodipine 5 mg daily.  2.  Ventricular tachycardia: He currently denies palpitations.  This occurred in the postoperative setting and may have been related to neostigmine.  There is documentation in the literature of ventricular tachycardia occurring after given reversal agents for neostigmine which also occurred in the setting. 30-day event monitor did not demonstrate any significant arrhythmias.  He denies palpitations.  He is on a beta-blocker.  Left ventricular systolic function is normal and he has nonobstructive coronary artery disease.  No further cardiac testing is indicated at this time.  3.  Hypercholesterolemia: Currently taking atorvastatin 10 mg daily.  4.  Hypertension: Blood pressure is normal.  Continue  amlodipine 5 mg daily and losartan 50 mg daily.    Disposition: Follow  up 6 months   Kate Sable, M.D., F.A.C.C.

## 2019-02-25 ENCOUNTER — Ambulatory Visit: Payer: Medicare Other | Admitting: Family

## 2019-02-25 ENCOUNTER — Encounter (HOSPITAL_COMMUNITY): Payer: Medicare Other

## 2019-04-08 ENCOUNTER — Encounter: Payer: Self-pay | Admitting: Internal Medicine

## 2019-04-22 ENCOUNTER — Encounter: Payer: Self-pay | Admitting: *Deleted

## 2019-04-22 ENCOUNTER — Other Ambulatory Visit: Payer: Self-pay

## 2019-04-22 ENCOUNTER — Encounter: Payer: Self-pay | Admitting: Internal Medicine

## 2019-04-22 ENCOUNTER — Ambulatory Visit: Payer: Medicare Other | Admitting: Internal Medicine

## 2019-04-22 ENCOUNTER — Other Ambulatory Visit: Payer: Self-pay | Admitting: *Deleted

## 2019-04-22 VITALS — BP 148/72 | HR 86 | Temp 97.0°F | Ht 72.0 in | Wt 223.8 lb

## 2019-04-22 DIAGNOSIS — R195 Other fecal abnormalities: Secondary | ICD-10-CM

## 2019-04-22 DIAGNOSIS — K921 Melena: Secondary | ICD-10-CM

## 2019-04-22 MED ORDER — PEG 3350-KCL-NA BICARB-NACL 420 G PO SOLR: 4000.0000 mL | Freq: Once | ORAL | 0 refills | Status: AC

## 2019-04-22 NOTE — Patient Instructions (Signed)
Schedule a diagnostic colonoscopy - propofol - hem positive stool; hematochezia  Continue pantoprazole 40 mg daily ]  Further recommendations to follow

## 2019-04-22 NOTE — H&P (View-Only) (Signed)
Primary Care Physician:  Glenda Chroman, MD Primary Gastroenterologist:  Dr. Gala Romney  Pre-Procedure History & Physical: HPI:  Seth Savage is a 69 y.o. male here for further evaluation of intermittent rectal bleeding, Hemoccult positive stool.  Colonoscopy just about 2 years ago demonstrated diverticulosis.  Prior history of colonic polyps.  States intermittent blood per rectum sometimes he thinks is a large amount denies straining spends 3 minutes on the toilet each morning to have his typical 1 bowel movement.  Denies diarrhea.  No black stools.  Has vague chronic right upper quadrant abdominal pain predating his cholecystectomy.  Prior EGD demonstrated LA grade a reflux esophagitis well-controlled on pantoprazole 40 mg daily.  History of H. pylori with treatment and documentation of eradication by follow-up biopsies.  History of partial colectomy previously for intussusception.  He does not take a fiber supplement.  Denies dysphagia. Past Medical History:  Diagnosis Date  . Arthritis    "wrists, thumb on left hand" (03/12/2018)  . Back injury 2003   "broke my back in 2 places; never had any OR for it" (03/12/2018)  . Carotid artery occlusion   . Childhood asthma   . COPD (chronic obstructive pulmonary disease) ([place])   . Essential hypertension   . GERD (gastroesophageal reflux disease)   . Goiter    Radioactive iodine treatment  . H pylori ulcer Jan 2016   Treated with Prevpac  . History of bronchitis   . History of kidney stones   . Hypercholesterolemia   . Hypertension   . Hypothyroid   . Migraine    "been about 2 yr w/out a headache" (03/12/2018)  . Neuropathy   . Pneumonia 1969  . Shortness of breath dyspnea    with exertion  . Wears glasses     Past Surgical History:  Procedure Laterality Date  . BIOPSY  05/01/2017   Procedure: BIOPSY;  Surgeon: Daneil Dolin, MD;  [facility];  Service: Endoscopy;;  gastric  . BIOPSY  08/19/2018   Procedure: BIOPSY;   Surgeon: Daneil Dolin, MD;  [facility];  Service: Endoscopy;;  duodenal and gastric  . CARDIAC CATHETERIZATION N/A 07/05/2016   Procedure: Left Heart Cath and Coronary Angiography;  Surgeon: Burnell Blanks, MD;  Location: [place] [facility];  Service: Cardiovascular;  Laterality: N/A;  . CARDIAC CATHETERIZATION  03/12/2018  . CAROTID ENDARTERECTOMY Left ?2009  . CARPAL TUNNEL RELEASE Right   . CHOLECYSTECTOMY N/A 04/24/2018   Procedure: LAPAROSCOPIC CHOLECYSTECTOMY;  Surgeon: Aviva Signs, MD;  [facility]: [facility];  Service: General;  Laterality: N/A;  . COLECTOMY  2000   Secondary to intussuception  . COLONOSCOPY  08/24/2004   Normal rectum,Small polyp at 25 cm, cold snared/ The remainder of the colonic mucosa appeared normal  . COLONOSCOPY  09/24/2012   Dr. Mike Craze hemorrhoids-likely source of hematochezia.Colonic diverticulosis  . COLONOSCOPY N/A 05/01/2017   Dr. Gala Romney: Diverticulosis, next colonoscopy in 5 years.  Marland Kitchen ENDARTERECTOMY Right 07/07/2016   Procedure: ENDARTERECTOMY CAROTID;  Surgeon: Rosetta Posner, MD;  Location: [facility];  Service: Vascular;  Laterality: Right;  . ESOPHAGOGASTRODUODENOSCOPY N/A 12/09/2014   Dr. Gala Romney: Erosive reflux esophagitis. Peptic Ulcer disease secondary to H.pylori. small hiatal hernia. Nonbleeding duodenal AVM. Duodenal erosions. TREATED WITH PREVPAC  . ESOPHAGOGASTRODUODENOSCOPY N/A 03/15/2015   Dr. Gala Romney: small hiatal hernia, healed PUD, duodenal AVM.   Marland Kitchen ESOPHAGOGASTRODUODENOSCOPY N/A 05/01/2017   Dr. Gala Romney: LA grade a esophagitis, gastritis, no H pylori  .  ESOPHAGOGASTRODUODENOSCOPY (EGD) WITH PROPOFOL N/A 08/19/2018   Procedure: ESOPHAGOGASTRODUODENOSCOPY (EGD) WITH PROPOFOL;  Surgeon: Daneil Dolin, MD;  [facility];  Service: Endoscopy;  Laterality: N/A;  2:45pm  . FRACTURE SURGERY    . LEFT HEART CATH AND CORONARY ANGIOGRAPHY N/A 03/12/2018   Procedure: LEFT HEART CATH AND CORONARY ANGIOGRAPHY;  Surgeon: Burnell Blanks, MD;  Location: [place] [facility];  Service: Cardiovascular;  Laterality: N/A;  . Multiple bilateral arm/wrist surgeries after falls    . ORIF SHOULDER FRACTURE Left 06/2002   "shattered; put a bunch of metal plates in it"  . PATCH ANGIOPLASTY Right 07/07/2016   Procedure: PATCH ANGIOPLASTY USING 0.8CM X 7.6CM HEMASHIELD PATCH;  Surgeon: Rosetta Posner, MD;  Location: [place];  Service: Vascular;  Laterality: Right;  . SHOULDER ARTHROSCOPY W/ ROTATOR CUFF REPAIR Right 1999  . SHOULDER OPEN ROTATOR CUFF REPAIR Left 1992   Metal implant     Prior to Admission medications   Medication Sig Start Date End Date Taking? Authorizing Provider  amLODipine (NORVASC) 5 MG tablet Take 1 tablet (5 mg total) by mouth daily. 09/12/18 04/22/19 Yes Herminio Commons, MD  aspirin EC 81 MG tablet Take 81 mg by mouth at bedtime.    Yes [provider]  atorvastatin (LIPITOR) 10 MG tablet Take 1 tablet (10 mg total) by mouth daily. 07/07/16  Yes Virgina Jock A, PA-C  Ca Carbonate-Mag Hydroxide (ROLAIDS PO) Take by mouth as needed.   Yes [provider]  fluticasone (FLONASE) 50 MCG/ACT nasal spray Place 2 sprays into both nostrils daily as needed for allergies.    Yes [provider]  hydrochlorothiazide (HYDRODIURIL) 12.5 MG tablet Take 12.5 mg by mouth daily. 11/29/18  Yes [provider]  isosorbide dinitrate (ISORDIL) 20 MG tablet Take 1 tablet (20 mg total) by mouth 3 (three) times daily. 12/23/18  Yes Herminio Commons, MD  levothyroxine (SYNTHROID, LEVOTHROID) 150 MCG tablet Take 150 mcg by mouth daily before breakfast.    Yes [provider]  losartan (COZAAR) 50 MG tablet Take 1 tablet (50 mg total) by mouth daily. 09/12/18  Yes Herminio Commons, MD  metoprolol succinate (TOPROL XL) 25 MG 24 hr tablet Take 1 tablet (25 mg total) by mouth daily. 04/25/18 04/25/19 Yes Aviva Signs, MD  pantoprazole (PROTONIX) 40 MG tablet Take 40 mg by mouth daily.    Yes [provider]  buPROPion (WELLBUTRIN SR) 150 MG 12 hr tablet Take 150 mg by mouth 2 (two) times daily.  07/30/17   [provider]  CARAFATE 1 GM/10ML suspension TAKE 10 ML BY MOUTH BEFORE MEALS AND AT BEDTIME 08/19/18   [provider]  dicyclomine (BENTYL) 20 MG tablet Take 1 tablet (20 mg total) by mouth 2 (two) times daily. 06/07/16 06/07/16  Comer Locket, PA-C    Allergies as of 04/22/2019 - Review Complete 04/22/2019  Allergen Reaction Noted  . Lyrica [pregabalin] Shortness Of Breath 09/17/2012  . Celecoxib Other (See Comments)   . Codeine Itching 11/09/2014    Family History  Problem Relation Age of Onset  . COPD Mother   . Mental illness Mother   . Hypertension Mother   . Varicose Veins Mother   . Heart attack Mother 28  . Hypertension Father   . Alcohol abuse Father   . Colon cancer Neg Hx     Social History   Socioeconomic History  . Marital status: Married    Spouse name: [name]  .  Number of children: 0  . Years of education: Ged  . Highest education level: Not on file  Occupational History    Comment: Disabled  Social Needs  . Financial resource strain: Not on file  . Food insecurity:    Worry: Not on file    Inability: Not on file  . Transportation needs:    Medical: Not on file    Non-medical: Not on file  Tobacco Use  . Smoking status: Former Smoker    Packs/day: 0.20    Years: 45.00    Pack years: 9.00    Types: Cigarettes    Last attempt to quit: 12/21/2017    Years since quitting: 1.3  . Smokeless tobacco: Never Used  Substance and Sexual Activity  . Alcohol use: Not Currently    Alcohol/week: 0.0 standard drinks  . Drug use: No  . Sexual activity: Not Currently  Lifestyle  . Physical activity:    Days per week: Not on file    Minutes per session: Not on file  . Stress: Not on file  Relationships  . Social connections:    Talks on phone: Not on file    Gets together: Not on file    Attends religious  service: Not on file    Active member of club or organization: Not on file    Attends meetings of clubs or organizations: Not on file    Relationship status: Not on file  . Intimate partner violence:    Fear of current or ex partner: Not on file    Emotionally abused: Not on file    Physically abused: Not on file    Forced sexual activity: Not on file  Other Topics Concern  . Not on file  Social History Narrative   Patient lives at home with his wife. [facility]).    Disabled.   Right handed.   Caffeine- two cups daily.   Three step children.    Review of Systems: See HPI, otherwise negative ROS  Physical Exam: BP (!) 148/72   Pulse 86   Temp (!) 97 F (36.1 C) (Oral)   Ht 6' (1.829 m)   Wt 223 lb 12.8 oz (101.5 kg)   BMI 30.35 kg/m  General:   Alert,  Well-developed, well-nourished, pleasant and cooperative in NAD Neck:  Supple; no masses or thyromegaly. No significant cervical adenopathy. Lungs:  Clear throughout to auscultation.   No wheezes, crackles, or rhonchi. No acute distress. Heart:  Regular rate and rhythm; no murmurs, clicks, rubs,  or gallops. Abdomen: Non-distended, normal bowel sounds.  Soft and nontender without appreciable mass or hepatosplenomegaly.  Pulses:  Normal pulses noted. Extremities:  Without clubbing or edema.  Impression/Plan: 68 year old gentleman with intermittent painless hematochezia.  Known diverticulosis.  Colonoscopy 2 years ago without significant findings as described above.  Personal history of colonic polyps. New symptoms demand further evaluation.  I would suspect benign anorectal bleeding however, evaluation indicated. GERD well-controlled on pantoprazole. Chronic right upper quadrant abdominal pain of uncertain clinical significance.   Recommendations:Schedule a diagnostic colonoscopy - propofol - hem positive stool; hematochezia.  The risks, benefits, limitations, alternatives and imponderables have been reviewed with the patient.  Questions have been answered. All parties are agreeable.   Continue pantoprazole 40 mg daily ]  Further recommendations to follow        Notice: This dictation was prepared with Dragon dictation along with smaller phrase technology. Any transcriptional errors that result from this process are unintentional and may not  be corrected upon review.

## 2019-04-22 NOTE — Progress Notes (Signed)
Primary Care Physician:  Glenda Chroman, MD Primary Gastroenterologist:  Dr. Gala Romney  Pre-Procedure History & Physical: HPI:  Seth Savage is a 68 y.o. male here for further evaluation of intermittent rectal bleeding, Hemoccult positive stool.  Colonoscopy just about 2 years ago demonstrated diverticulosis.  Prior history of colonic polyps.  States intermittent blood per rectum sometimes he thinks is a large amount denies straining spends 3 minutes on the toilet each morning to have his typical 1 bowel movement.  Denies diarrhea.  No black stools.  Has vague chronic right upper quadrant abdominal pain predating his cholecystectomy.  Prior EGD demonstrated LA grade a reflux esophagitis well-controlled on pantoprazole 40 mg daily.  History of H. pylori with treatment and documentation of eradication by follow-up biopsies.  History of partial colectomy previously for intussusception.  He does not take a fiber supplement.  Denies dysphagia. Past Medical History:  Diagnosis Date  . Arthritis    "wrists, thumb on left hand" (03/12/2018)  . Back injury 2003   "broke my back in 2 places; never had any OR for it" (03/12/2018)  . Carotid artery occlusion   . Childhood asthma   . COPD (chronic obstructive pulmonary disease) ([place])   . Essential hypertension   . GERD (gastroesophageal reflux disease)   . Goiter    Radioactive iodine treatment  . H pylori ulcer Jan 2016   Treated with Prevpac  . History of bronchitis   . History of kidney stones   . Hypercholesterolemia   . Hypertension   . Hypothyroid   . Migraine    "been about 2 yr w/out a headache" (03/12/2018)  . Neuropathy   . Pneumonia 1969  . Shortness of breath dyspnea    with exertion  . Wears glasses     Past Surgical History:  Procedure Laterality Date  . BIOPSY  05/01/2017   Procedure: BIOPSY;  Surgeon: Daneil Dolin, MD;  [facility];  Service: Endoscopy;;  gastric  . BIOPSY  08/19/2018   Procedure: BIOPSY;   Surgeon: Daneil Dolin, MD;  [facility];  Service: Endoscopy;;  duodenal and gastric  . CARDIAC CATHETERIZATION N/A 07/05/2016   Procedure: Left Heart Cath and Coronary Angiography;  Surgeon: Burnell Blanks, MD;  Location: [place] [facility];  Service: Cardiovascular;  Laterality: N/A;  . CARDIAC CATHETERIZATION  03/12/2018  . CAROTID ENDARTERECTOMY Left ?2009  . CARPAL TUNNEL RELEASE Right   . CHOLECYSTECTOMY N/A 04/24/2018   Procedure: LAPAROSCOPIC CHOLECYSTECTOMY;  Surgeon: Aviva Signs, MD;  [facility];  Service: General;  Laterality: N/A;  . COLECTOMY  2000   Secondary to intussuception  . COLONOSCOPY  08/24/2004   Normal rectum,Small polyp at 25 cm, cold snared/ The remainder of the colonic mucosa appeared normal  . COLONOSCOPY  09/24/2012   Dr. Mike Craze hemorrhoids-likely source of hematochezia.Colonic diverticulosis  . COLONOSCOPY N/A 05/01/2017   Dr. Gala Romney: Diverticulosis, next colonoscopy in 5 years.  Marland Kitchen ENDARTERECTOMY Right 07/07/2016   Procedure: ENDARTERECTOMY CAROTID;  Surgeon: Rosetta Posner, MD;  Location: [facility];  Service: Vascular;  Laterality: Right;  . ESOPHAGOGASTRODUODENOSCOPY N/A 12/09/2014   Dr. Gala Romney: Erosive reflux esophagitis. Peptic Ulcer disease secondary to H.pylori. small hiatal hernia. Nonbleeding duodenal AVM. Duodenal erosions. TREATED WITH PREVPAC  . ESOPHAGOGASTRODUODENOSCOPY N/A 03/15/2015   Dr. Gala Romney: small hiatal hernia, healed PUD, duodenal AVM.   Marland Kitchen ESOPHAGOGASTRODUODENOSCOPY N/A 05/01/2017   Dr. Gala Romney: LA grade a esophagitis, gastritis, no H pylori  .  ESOPHAGOGASTRODUODENOSCOPY (EGD) WITH PROPOFOL N/A 08/19/2018   Procedure: ESOPHAGOGASTRODUODENOSCOPY (EGD) WITH PROPOFOL;  Surgeon: Daneil Dolin, MD;  [facility];  Service: Endoscopy;  Laterality: N/A;  2:45pm  . FRACTURE SURGERY    . LEFT HEART CATH AND CORONARY ANGIOGRAPHY N/A 03/12/2018   Procedure: LEFT HEART CATH AND CORONARY ANGIOGRAPHY;  Surgeon: Burnell Blanks, MD;  Location: [place] [facility];  Service: Cardiovascular;  Laterality: N/A;  . Multiple bilateral arm/wrist surgeries after falls    . ORIF SHOULDER FRACTURE Left 06/2002   "shattered; put a bunch of metal plates in it"  . PATCH ANGIOPLASTY Right 07/07/2016   Procedure: PATCH ANGIOPLASTY USING 0.8CM X 7.6CM HEMASHIELD PATCH;  Surgeon: Rosetta Posner, MD;  Location: [place];  Service: Vascular;  Laterality: Right;  . SHOULDER ARTHROSCOPY W/ ROTATOR CUFF REPAIR Right 1999  . SHOULDER OPEN ROTATOR CUFF REPAIR Left 1992   Metal implant     Prior to Admission medications   Medication Sig Start Date End Date Taking? Authorizing Provider  amLODipine (NORVASC) 5 MG tablet Take 1 tablet (5 mg total) by mouth daily. 09/12/18 04/22/19 Yes Herminio Commons, MD  aspirin EC 81 MG tablet Take 81 mg by mouth at bedtime.    Yes [provider]  atorvastatin (LIPITOR) 10 MG tablet Take 1 tablet (10 mg total) by mouth daily. 07/07/16  Yes Virgina Jock A, PA-C  Ca Carbonate-Mag Hydroxide (ROLAIDS PO) Take by mouth as needed.   Yes [provider]  fluticasone (FLONASE) 50 MCG/ACT nasal spray Place 2 sprays into both nostrils daily as needed for allergies.    Yes [provider]  hydrochlorothiazide (HYDRODIURIL) 12.5 MG tablet Take 12.5 mg by mouth daily. 11/29/18  Yes [provider]  isosorbide dinitrate (ISORDIL) 20 MG tablet Take 1 tablet (20 mg total) by mouth 3 (three) times daily. 12/23/18  Yes Herminio Commons, MD  levothyroxine (SYNTHROID, LEVOTHROID) 150 MCG tablet Take 150 mcg by mouth daily before breakfast.    Yes [provider]  losartan (COZAAR) 50 MG tablet Take 1 tablet (50 mg total) by mouth daily. 09/12/18  Yes Herminio Commons, MD  metoprolol succinate (TOPROL XL) 25 MG 24 hr tablet Take 1 tablet (25 mg total) by mouth daily. 04/25/18 04/25/19 Yes Aviva Signs, MD  pantoprazole (PROTONIX) 40 MG tablet Take 40 mg by mouth daily.    Yes [provider]  buPROPion (WELLBUTRIN SR) 150 MG 12 hr tablet Take 150 mg by mouth 2 (two) times daily.  07/30/17   [provider]  CARAFATE 1 GM/10ML suspension TAKE 10 ML BY MOUTH BEFORE MEALS AND AT BEDTIME 08/19/18   [provider]  dicyclomine (BENTYL) 20 MG tablet Take 1 tablet (20 mg total) by mouth 2 (two) times daily. 06/07/16 06/07/16  Comer Locket, PA-C    Allergies as of 04/22/2019 - Review Complete 04/22/2019  Allergen Reaction Noted  . Lyrica [pregabalin] Shortness Of Breath 09/17/2012  . Celecoxib Other (See Comments)   . Codeine Itching 11/09/2014    Family History  Problem Relation Age of Onset  . COPD Mother   . Mental illness Mother   . Hypertension Mother   . Varicose Veins Mother   . Heart attack Mother 24  . Hypertension Father   . Alcohol abuse Father   . Colon cancer Neg Hx     Social History   Socioeconomic History  . Marital status: Married    Spouse name: [name]  .  Number of children: 0  . Years of education: Ged  . Highest education level: Not on file  Occupational History    Comment: Disabled  Social Needs  . Financial resource strain: Not on file  . Food insecurity:    Worry: Not on file    Inability: Not on file  . Transportation needs:    Medical: Not on file    Non-medical: Not on file  Tobacco Use  . Smoking status: Former Smoker    Packs/day: 0.20    Years: 45.00    Pack years: 9.00    Types: Cigarettes    Last attempt to quit: 12/21/2017    Years since quitting: 1.3  . Smokeless tobacco: Never Used  Substance and Sexual Activity  . Alcohol use: Not Currently    Alcohol/week: 0.0 standard drinks  . Drug use: No  . Sexual activity: Not Currently  Lifestyle  . Physical activity:    Days per week: Not on file    Minutes per session: Not on file  . Stress: Not on file  Relationships  . Social connections:    Talks on phone: Not on file    Gets together: Not on file    Attends religious  service: Not on file    Active member of club or organization: Not on file    Attends meetings of clubs or organizations: Not on file    Relationship status: Not on file  . Intimate partner violence:    Fear of current or ex partner: Not on file    Emotionally abused: Not on file    Physically abused: Not on file    Forced sexual activity: Not on file  Other Topics Concern  . Not on file  Social History Narrative   Patient lives at home with his wife. [facility]).    Disabled.   Right handed.   Caffeine- two cups daily.   Three step children.    Review of Systems: See HPI, otherwise negative ROS  Physical Exam: BP (!) 148/72   Pulse 86   Temp (!) 97 F (36.1 C) (Oral)   Ht 6' (1.829 m)   Wt 223 lb 12.8 oz (101.5 kg)   BMI 30.35 kg/m  General:   Alert,  Well-developed, well-nourished, pleasant and cooperative in NAD Neck:  Supple; no masses or thyromegaly. No significant cervical adenopathy. Lungs:  Clear throughout to auscultation.   No wheezes, crackles, or rhonchi. No acute distress. Heart:  Regular rate and rhythm; no murmurs, clicks, rubs,  or gallops. Abdomen: Non-distended, normal bowel sounds.  Soft and nontender without appreciable mass or hepatosplenomegaly.  Pulses:  Normal pulses noted. Extremities:  Without clubbing or edema.  Impression/Plan: 68 year old gentleman with intermittent painless hematochezia.  Known diverticulosis.  Colonoscopy 2 years ago without significant findings as described above.  Personal history of colonic polyps. New symptoms demand further evaluation.  I would suspect benign anorectal bleeding however, evaluation indicated. GERD well-controlled on pantoprazole. Chronic right upper quadrant abdominal pain of uncertain clinical significance.   Recommendations:Schedule a diagnostic colonoscopy - propofol - hem positive stool; hematochezia.  The risks, benefits, limitations, alternatives and imponderables have been reviewed with the patient.  Questions have been answered. All parties are agreeable.   Continue pantoprazole 40 mg daily ]  Further recommendations to follow        Notice: This dictation was prepared with Dragon dictation along with smaller phrase technology. Any transcriptional errors that result from this process are unintentional and may not  be corrected upon review.

## 2019-04-23 NOTE — Patient Instructions (Signed)
Seth Savage  04/23/2019     @PREFPERIOPPHARMACY @   Your procedure is scheduled on  04/28/2019 .  Report to [facility] at  715   A.M.  Call this number if you have problems the morning of surgery:  [phone]   Remember:  Follow the diet and prep instructions given to you by Dr Roseanne Kaufman office.                       Take these medicines the morning of surgery with A SIP OF WATER  Amlodipine, wellbutrin, isosorbide, levothyroxine, metoprolol, protonix.    Do not wear jewelry, make-up or nail polish.  Do not wear lotions, powders, or perfumes, or deodorant.  Do not shave 48 hours prior to surgery.  Men may shave face and neck.  Do not bring valuables to the hospital.  [facility] is not responsible for any belongings or valuables.  Contacts, dentures or bridgework may not be worn into surgery.  Leave your suitcase in the car.  After surgery it may be brought to your room.  For patients admitted to the hospital, discharge time will be determined by your treatment team.  Patients discharged the day of surgery will not be allowed to drive home.   Name and phone number of your driver:   family Special instructions:  None  Please read over the following fact sheets that you were given. Anesthesia Post-op Instructions and Care and Recovery After Surgery       Colonoscopy, Adult, Care After This sheet gives you information about how to care for yourself after your procedure. Your health care provider may also give you more specific instructions. If you have problems or questions, contact your health care provider. What can I expect after the procedure? After the procedure, it is common to have:  A small amount of blood in your stool for 24 hours after the procedure.  Some gas.  Mild abdominal cramping or bloating. Follow these instructions at home: General instructions  For the first 24 hours after the procedure: ? Do not drive or use machinery. ? Do not sign  important documents. ? Do not drink alcohol. ? Do your regular daily activities at a slower pace than normal. ? Eat soft, easy-to-digest foods.  Take over-the-counter or prescription medicines only as told by your health care provider. Relieving cramping and bloating   Try walking around when you have cramps or feel bloated.  Apply heat to your abdomen as told by your health care provider. Use a heat source that your health care provider recommends, such as a moist heat pack or a heating pad. ? Place a towel between your skin and the heat source. ? Leave the heat on for 20-30 minutes. ? Remove the heat if your skin turns bright red. This is especially important if you are unable to feel pain, heat, or cold. You may have a greater risk of getting burned. Eating and drinking   Drink enough fluid to keep your urine pale yellow.  Resume your normal diet as instructed by your health care provider. Avoid heavy or fried foods that are hard to digest.  Avoid drinking alcohol for as long as instructed by your health care provider. Contact a health care provider if:  You have blood in your stool 2-3 days after the procedure. Get help right away if:  You have more than a small spotting of blood in your stool.  You  pass large blood clots in your stool.  Your abdomen is swollen.  You have nausea or vomiting.  You have a fever.  You have increasing abdominal pain that is not relieved with medicine. Summary  After the procedure, it is common to have a small amount of blood in your stool. You may also have mild abdominal cramping and bloating.  For the first 24 hours after the procedure, do not drive or use machinery, sign important documents, or drink alcohol.  Contact your health care provider if you have a lot of blood in your stool, nausea or vomiting, a fever, or increased abdominal pain. This information is not intended to replace advice given to you by your health care provider.  Make sure you discuss any questions you have with your health care provider. Document Released: 06/20/2004 Document Revised: 08/29/2017 Document Reviewed: 01/18/2016 Elsevier Interactive Patient Education  2019 [employer], Care After These instructions provide you with information about caring for yourself after your procedure. Your health care provider may also give you more specific instructions. Your treatment has been planned according to current medical practices, but problems sometimes occur. Call your health care provider if you have any problems or questions after your procedure. What can I expect after the procedure? After your procedure, you may:  Feel sleepy for several hours.  Feel clumsy and have poor balance for several hours.  Feel forgetful about what happened after the procedure.  Have poor judgment for several hours.  Feel nauseous or vomit.  Have a sore throat if you had a breathing tube during the procedure. Follow these instructions at home: For at least 24 hours after the procedure:      Have a responsible adult stay with you. It is important to have someone help care for you until you are awake and alert.  Rest as needed.  Do not: ? Participate in activities in which you could fall or become injured. ? Drive. ? Use heavy machinery. ? Drink alcohol. ? Take sleeping pills or medicines that cause drowsiness. ? Make important decisions or sign legal documents. ? Take care of children on your own. Eating and drinking  Follow the diet that is recommended by your health care provider.  If you vomit, drink water, juice, or soup when you can drink without vomiting.  Make sure you have little or no nausea before eating solid foods. General instructions  Take over-the-counter and prescription medicines only as told by your health care provider.  If you have sleep apnea, surgery and certain medicines can increase your risk for  breathing problems. Follow instructions from your health care provider about wearing your sleep device: ? Anytime you are sleeping, including during daytime naps. ? While taking prescription pain medicines, sleeping medicines, or medicines that make you drowsy.  If you smoke, do not smoke without supervision.  Keep all follow-up visits as told by your health care provider. This is important. Contact a health care provider if:  You keep feeling nauseous or you keep vomiting.  You feel light-headed.  You develop a rash.  You have a fever. Get help right away if:  You have trouble breathing. Summary  For several hours after your procedure, you may feel sleepy and have poor judgment.  Have a responsible adult stay with you for at least 24 hours or until you are awake and alert. This information is not intended to replace advice given to you by your health care provider. Make sure you  discuss any questions you have with your health care provider. Document Released: 02/27/2016 Document Revised: 06/22/2017 Document Reviewed: 02/27/2016 Elsevier Interactive Patient Education  2019 [employer].

## 2019-04-24 ENCOUNTER — Other Ambulatory Visit: Payer: Self-pay

## 2019-04-24 ENCOUNTER — Encounter (HOSPITAL_COMMUNITY)
Admission: RE | Admit: 2019-04-24 | Discharge: 2019-04-24 | Disposition: A | Discharge status: Home / Self Care | Payer: Medicare Other | Source: Ambulatory Visit | Attending: Internal Medicine | Admitting: Internal Medicine

## 2019-04-24 ENCOUNTER — Encounter (HOSPITAL_COMMUNITY): Payer: Self-pay

## 2019-04-24 ENCOUNTER — Other Ambulatory Visit (HOSPITAL_COMMUNITY)
Admission: RE | Admit: 2019-04-24 | Discharge: 2019-04-24 | Disposition: A | Discharge status: Home / Self Care | Payer: Medicare Other | Source: Ambulatory Visit | Attending: Internal Medicine | Admitting: Internal Medicine

## 2019-04-24 DIAGNOSIS — Z01812 Encounter for preprocedural laboratory examination: Secondary | ICD-10-CM | POA: Insufficient documentation

## 2019-04-24 DIAGNOSIS — Z1159 Encounter for screening for other viral diseases: Secondary | ICD-10-CM | POA: Insufficient documentation

## 2019-04-24 LAB — BASIC METABOLIC PANEL
Anion gap: 11 (ref 5–15)
BUN: 25 mg/dL — ABNORMAL HIGH (ref 8–23)
CO2: 26 mmol/L (ref 22–32)
Calcium: 9.2 mg/dL (ref 8.9–10.3)
Chloride: 100 mmol/L (ref 98–111)
Creatinine, Ser: 1.07 mg/dL (ref 0.61–1.24)
GFR calc Af Amer: >60 mL/min (ref >60)
GFR calc non Af Amer: >60 mL/min (ref >60)
Glucose, Bld: 106 mg/dL — ABNORMAL HIGH (ref 70–99)
Potassium: 3.6 mmol/L (ref 3.5–5.1)
Sodium: 137 mmol/L (ref 135–145)

## 2019-04-25 LAB — NOVEL CORONAVIRUS, NAA (HOSP ORDER, SEND-OUT TO REF LAB; TAT 18-24 HRS): SARS-CoV-2, NAA: NOT DETECTED

## 2019-04-28 ENCOUNTER — Other Ambulatory Visit: Payer: Self-pay

## 2019-04-28 ENCOUNTER — Encounter (HOSPITAL_COMMUNITY): Payer: Self-pay | Admitting: *Deleted

## 2019-04-28 ENCOUNTER — Ambulatory Visit (HOSPITAL_COMMUNITY): Payer: Medicare Other | Admitting: Anesthesiology

## 2019-04-28 ENCOUNTER — Ambulatory Visit (HOSPITAL_COMMUNITY)
Admission: RE | Admit: 2019-04-28 | Discharge: 2019-04-28 | Disposition: A | Discharge status: Home / Self Care | Payer: Medicare Other | Attending: Internal Medicine | Admitting: Internal Medicine

## 2019-04-28 ENCOUNTER — Encounter (HOSPITAL_COMMUNITY)
Admission: RE | Disposition: A | Discharge status: Home / Self Care | Payer: Self-pay | Source: Home / Self Care | Attending: Internal Medicine

## 2019-04-28 DIAGNOSIS — Z79899 Other long term (current) drug therapy: Secondary | ICD-10-CM | POA: Insufficient documentation

## 2019-04-28 DIAGNOSIS — G8929 Other chronic pain: Secondary | ICD-10-CM | POA: Insufficient documentation

## 2019-04-28 DIAGNOSIS — M19042 Primary osteoarthritis, left hand: Secondary | ICD-10-CM | POA: Diagnosis not present

## 2019-04-28 DIAGNOSIS — E7800 Pure hypercholesterolemia, unspecified: Secondary | ICD-10-CM | POA: Diagnosis not present

## 2019-04-28 DIAGNOSIS — K921 Melena: Secondary | ICD-10-CM | POA: Insufficient documentation

## 2019-04-28 DIAGNOSIS — K640 First degree hemorrhoids: Secondary | ICD-10-CM | POA: Insufficient documentation

## 2019-04-28 DIAGNOSIS — Z79890 Hormone replacement therapy (postmenopausal): Secondary | ICD-10-CM | POA: Insufficient documentation

## 2019-04-28 DIAGNOSIS — R195 Other fecal abnormalities: Secondary | ICD-10-CM

## 2019-04-28 DIAGNOSIS — J449 Chronic obstructive pulmonary disease, unspecified: Secondary | ICD-10-CM | POA: Diagnosis not present

## 2019-04-28 DIAGNOSIS — Z7982 Long term (current) use of aspirin: Secondary | ICD-10-CM | POA: Insufficient documentation

## 2019-04-28 DIAGNOSIS — K5730 Diverticulosis of large intestine without perforation or abscess without bleeding: Secondary | ICD-10-CM | POA: Diagnosis not present

## 2019-04-28 DIAGNOSIS — I10 Essential (primary) hypertension: Secondary | ICD-10-CM | POA: Insufficient documentation

## 2019-04-28 DIAGNOSIS — Z8619 Personal history of other infectious and parasitic diseases: Secondary | ICD-10-CM | POA: Diagnosis not present

## 2019-04-28 DIAGNOSIS — Z9049 Acquired absence of other specified parts of digestive tract: Secondary | ICD-10-CM | POA: Insufficient documentation

## 2019-04-28 DIAGNOSIS — Z8249 Family history of ischemic heart disease and other diseases of the circulatory system: Secondary | ICD-10-CM | POA: Insufficient documentation

## 2019-04-28 DIAGNOSIS — Z87442 Personal history of urinary calculi: Secondary | ICD-10-CM | POA: Insufficient documentation

## 2019-04-28 DIAGNOSIS — R1011 Right upper quadrant pain: Secondary | ICD-10-CM | POA: Diagnosis not present

## 2019-04-28 DIAGNOSIS — Z86010 Personal history of colonic polyps: Secondary | ICD-10-CM | POA: Diagnosis not present

## 2019-04-28 DIAGNOSIS — M19031 Primary osteoarthritis, right wrist: Secondary | ICD-10-CM | POA: Diagnosis not present

## 2019-04-28 DIAGNOSIS — Z87891 Personal history of nicotine dependence: Secondary | ICD-10-CM | POA: Diagnosis not present

## 2019-04-28 DIAGNOSIS — K219 Gastro-esophageal reflux disease without esophagitis: Secondary | ICD-10-CM | POA: Diagnosis not present

## 2019-04-28 DIAGNOSIS — E039 Hypothyroidism, unspecified: Secondary | ICD-10-CM | POA: Diagnosis not present

## 2019-04-28 DIAGNOSIS — G629 Polyneuropathy, unspecified: Secondary | ICD-10-CM | POA: Insufficient documentation

## 2019-04-28 DIAGNOSIS — M19032 Primary osteoarthritis, left wrist: Secondary | ICD-10-CM | POA: Diagnosis not present

## 2019-04-28 HISTORY — PX: COLONOSCOPY WITH PROPOFOL: SHX5780

## 2019-04-28 SURGERY — COLONOSCOPY WITH PROPOFOL
Anesthesia: General

## 2019-04-28 MED ORDER — PROPOFOL 10 MG/ML IV BOLUS
INTRAVENOUS | Status: DC | PRN
  Administered 2019-04-28: 20 mg via INTRAVENOUS
  Administered 2019-04-28: 10 mg via INTRAVENOUS
  Administered 2019-04-28: 20 mg via INTRAVENOUS
  Administered 2019-04-28 (×2): 10 mg via INTRAVENOUS
  Administered 2019-04-28: 20 mg via INTRAVENOUS

## 2019-04-28 MED ORDER — KETAMINE HCL 50 MG/5ML IJ SOSY
PREFILLED_SYRINGE | INTRAMUSCULAR | Status: AC
  Filled 2019-04-28: qty 5

## 2019-04-28 MED ORDER — LACTATED RINGERS IV SOLN
INTRAVENOUS | Status: DC
  Administered 2019-04-28: 1000 mL via INTRAVENOUS

## 2019-04-28 MED ORDER — PROPOFOL 10 MG/ML IV BOLUS
INTRAVENOUS | Status: AC
  Filled 2019-04-28: qty 40

## 2019-04-28 MED ORDER — CHLORHEXIDINE GLUCONATE CLOTH 2 % EX PADS: 6.0000 | MEDICATED_PAD | Freq: Once | CUTANEOUS | Status: DC

## 2019-04-28 MED ORDER — MIDAZOLAM HCL 2 MG/2ML IJ SOLN: 0.5000 mg | Freq: Once | INTRAMUSCULAR | Status: DC | PRN

## 2019-04-28 MED ORDER — PROMETHAZINE HCL 25 MG/ML IJ SOLN: 6.2500 mg | INTRAMUSCULAR | Status: DC | PRN

## 2019-04-28 MED ORDER — PROPOFOL 10 MG/ML IV BOLUS
INTRAVENOUS | Status: AC
  Filled 2019-04-28: qty 20

## 2019-04-28 MED ORDER — PROPOFOL 500 MG/50ML IV EMUL
INTRAVENOUS | Status: DC | PRN
  Administered 2019-04-28: 150 ug/kg/min via INTRAVENOUS

## 2019-04-28 NOTE — Anesthesia Postprocedure Evaluation (Signed)
Anesthesia Post Note  Patient: Seth Savage  Procedure(s) Performed: COLONOSCOPY WITH PROPOFOL (N/A )  Patient location during evaluation: PACU Anesthesia Type: General Level of consciousness: awake and alert and oriented Pain management: pain level controlled Vital Signs Assessment: post-procedure vital signs reviewed and stable Respiratory status: spontaneous breathing Cardiovascular status: blood pressure returned to baseline and stable Postop Assessment: no apparent nausea or vomiting Anesthetic complications: no     Last Vitals:  Vitals:   04/28/19 0758 04/28/19 0920  BP: (!) 161/85 105/88  Pulse:  61  Resp: 20 14  Temp: 36.5 C (P) 36.6 C  SpO2: 98% 98%    Last Pain:  Vitals:   04/28/19 0758  TempSrc: Oral  PainSc: 0-No pain                 Mari Battaglia

## 2019-04-28 NOTE — Anesthesia Preprocedure Evaluation (Signed)
Anesthesia Evaluation  Patient identified by MRN, date of birth, ID band Patient awake    Reviewed: Allergy & Precautions, NPO status , Patient's Chart, lab work & pertinent test results, reviewed documented beta blocker date and time   Airway Mallampati: I  TM Distance: >3 FB Neck ROM: Full    Dental no notable dental hx. (+) Teeth Intact   Pulmonary shortness of breath and with exertion, asthma , pneumonia, resolved, COPD, former smoker,    Pulmonary exam normal breath sounds clear to auscultation       Cardiovascular Exercise Tolerance: Good hypertension, Pt. on medications and Pt. on home beta blockers + angina + CAD  Normal cardiovascular examI Rhythm:Regular Rate:Normal  On Isosorbide-states can walk a mile -states flat lined during L/S chole    Neuro/Psych  Headaches, negative psych ROS   GI/Hepatic Neg liver ROS, PUD, GERD  Medicated and Controlled,  Endo/Other  Hypothyroidism   Renal/GU Renal InsufficiencyRenal disease  negative genitourinary   Musculoskeletal negative musculoskeletal ROS (+)   Abdominal   Peds negative pediatric ROS (+)  Hematology negative hematology ROS (+)   Anesthesia Other Findings   Reproductive/Obstetrics negative OB ROS                             Anesthesia Physical Anesthesia Plan  ASA: III  Anesthesia Plan: General   Post-op Pain Management:    Induction: Intravenous  PONV Risk Score and Plan:   Airway Management Planned: Nasal Cannula and Simple Face Mask  Additional Equipment:   Intra-op Plan:   Post-operative Plan: Extubation in OR  Informed Consent: I have reviewed the patients History and Physical, chart, labs and discussed the procedure including the risks, benefits and alternatives for the proposed anesthesia with the patient or authorized representative who has indicated his/her understanding and acceptance.     Dental  advisory given  Plan Discussed with: CRNA  Anesthesia Plan Comments: (Plan Full PPE use  Plan GA with GETA back up as needed )        Anesthesia Quick Evaluation

## 2019-04-28 NOTE — Discharge Instructions (Signed)
Colonoscopy Discharge Instructions  Read the instructions outlined below and refer to this sheet in the next few weeks. These discharge instructions provide you with general information on caring for yourself after you leave the hospital. Your doctor may also give you specific instructions. While your treatment has been planned according to the most current medical practices available, unavoidable complications occasionally occur. If you have any problems or questions after discharge, call Dr. Gala Romney at [phone]. ACTIVITY  You may resume your regular activity, but move at a slower pace for the next 24 hours.   Take frequent rest periods for the next 24 hours.   Walking will help get rid of the air and reduce the bloated feeling in your belly (abdomen).   No driving for 24 hours (because of the medicine (anesthesia) used during the test).    Do not sign any important legal documents or operate any machinery for 24 hours (because of the anesthesia used during the test).  NUTRITION  Drink plenty of fluids.   You may resume your normal diet as instructed by your doctor.   Begin with a light meal and progress to your normal diet. Heavy or fried foods are harder to digest and may make you feel sick to your stomach (nauseated).   Avoid alcoholic beverages for 24 hours or as instructed.  MEDICATIONS  You may resume your normal medications unless your doctor tells you otherwise.  WHAT YOU CAN EXPECT TODAY  Some feelings of bloating in the abdomen.   Passage of more gas than usual.   Spotting of blood in your stool or on the toilet paper.  IF YOU HAD POLYPS REMOVED DURING THE COLONOSCOPY:  No aspirin products for 7 days or as instructed.   No alcohol for 7 days or as instructed.   Eat a soft diet for the next 24 hours.  FINDING OUT THE RESULTS OF YOUR TEST Not all test results are available during your visit. If your test results are not back during the visit, make an appointment  with your caregiver to find out the results. Do not assume everything is normal if you have not heard from your caregiver or the medical facility. It is important for you to follow up on all of your test results.  SEEK IMMEDIATE MEDICAL ATTENTION IF:  You have more than a spotting of blood in your stool.   Your belly is swollen (abdominal distention).   You are nauseated or vomiting.   You have a temperature over 101.   You have abdominal pain or discomfort that is severe or gets worse throughout the day.    Diverticulosis and hemorrhoid information provided  Review all your medications with your primary care physician to make sure you are taking them all correctly  [facility] hemorrhoid cream-apply pea-sized amount to the anorectum 3 times a day as needed  Repeat colonoscopy in 5 years  Office visit with [place] in 3 months  At patient's request, I spoke to his wife, [name], at [phone] and discussed impression and recommendations.   Hemorrhoids Hemorrhoids are swollen veins in and around the rectum or anus. There are two types of hemorrhoids:  Internal hemorrhoids. These occur in the veins that are just inside the rectum. They may poke through to the outside and become irritated and painful.  External hemorrhoids. These occur in the veins that are outside the anus and can be felt as a painful swelling or hard lump near the anus. Most hemorrhoids do not cause serious  problems, and they can be managed with home treatments such as diet and lifestyle changes. If home treatments do not help the symptoms, procedures can be done to shrink or remove the hemorrhoids. What are the causes? This condition is caused by increased pressure in the anal area. This pressure may result from various things, including:  Constipation.  Straining to have a bowel movement.  Diarrhea.  Pregnancy.  Obesity.  Sitting for long periods of time.  Heavy lifting or other activity that  causes you to strain.  Anal sex.  Riding a bike for a long period of time. What are the signs or symptoms? Symptoms of this condition include:  Pain.  Anal itching or irritation.  Rectal bleeding.  Leakage of stool (feces).  Anal swelling.  One or more lumps around the anus. How is this diagnosed? This condition can often be diagnosed through a visual exam. Other exams or tests may also be done, such as:  An exam that involves feeling the rectal area with a gloved hand (digital rectal exam).  An exam of the anal canal that is done using a small tube (anoscope).  A blood test, if you have lost a significant amount of blood.  A test to look inside the colon using a flexible tube with a camera on the end (sigmoidoscopy or colonoscopy). How is this treated? This condition can usually be treated at home. However, various procedures may be done if dietary changes, lifestyle changes, and other home treatments do not help your symptoms. These procedures can help make the hemorrhoids smaller or remove them completely. Some of these procedures involve surgery, and others do not. Common procedures include:  Rubber band ligation. Rubber bands are placed at the base of the hemorrhoids to cut off their blood supply.  Sclerotherapy. Medicine is injected into the hemorrhoids to shrink them.  Infrared coagulation. A type of light energy is used to get rid of the hemorrhoids.  Hemorrhoidectomy surgery. The hemorrhoids are surgically removed, and the veins that supply them are tied off.  Stapled hemorrhoidopexy surgery. The surgeon staples the base of the hemorrhoid to the rectal wall. Follow these instructions at home: Eating and drinking   Eat foods that have a lot of fiber in them, such as whole grains, beans, nuts, fruits, and vegetables.  Ask your health care provider about taking products that have added fiber (fiber supplements).  Reduce the amount of fat in your diet. You can do  this by eating low-fat dairy products, eating less red meat, and avoiding processed foods.  Drink enough fluid to keep your urine pale yellow. Managing pain and swelling   Take warm sitz baths for 20 minutes, 3-4 times a day to ease pain and discomfort. You may do this in a bathtub or using a portable sitz bath that fits over the toilet.  If directed, apply ice to the affected area. Using ice packs between sitz baths may be helpful. ? Put ice in a plastic bag. ? Place a towel between your skin and the bag. ? Leave the ice on for 20 minutes, 2-3 times a day. General instructions  Take over-the-counter and prescription medicines only as told by your health care provider.  Use medicated creams or suppositories as told.  Get regular exercise. Ask your health care provider how much and what kind of exercise is best for you. In general, you should do moderate exercise for at least 30 minutes on most days of the week (150 minutes each week).  This can include activities such as walking, biking, or yoga.  Go to the bathroom when you have the urge to have a bowel movement. Do not wait.  Avoid straining to have bowel movements.  Keep the anal area dry and clean. Use wet toilet paper or moist towelettes after a bowel movement.  Do not sit on the toilet for long periods of time. This increases blood pooling and pain.  Keep all follow-up visits as told by your health care provider. This is important. Contact a health care provider if you have:  Increasing pain and swelling that are not controlled by treatment or medicine.  Difficulty having a bowel movement, or you are unable to have a bowel movement.  Pain or inflammation outside the area of the hemorrhoids. Get help right away if you have:  Uncontrolled bleeding from your rectum. Summary  Hemorrhoids are swollen veins in and around the rectum or anus.  Most hemorrhoids can be managed with home treatments such as diet and lifestyle  changes.  Taking warm sitz baths can help ease pain and discomfort.  In severe cases, procedures or surgery can be done to shrink or remove the hemorrhoids. This information is not intended to replace advice given to you by your health care provider. Make sure you discuss any questions you have with your health care provider. Document Released: 11/03/2000 Document Revised: 03/28/2018 Document Reviewed: 03/28/2018 Elsevier Interactive Patient Education  2019 [employer].  Diverticulosis  Diverticulosis is a condition that develops when small pouches (diverticula) form in the wall of the large intestine (colon). The colon is where water is absorbed and stool is formed. The pouches form when the inside layer of the colon pushes through weak spots in the outer layers of the colon. You may have a few pouches or many of them. What are the causes? The cause of this condition is not known. What increases the risk? The following factors may make you more likely to develop this condition:  Being older than age 34. Your risk for this condition increases with age. Diverticulosis is rare among people younger than age 75. By age 71, many people have it.  Eating a low-fiber diet.  Having frequent constipation.  Being overweight.  Not getting enough exercise.  Smoking.  Taking over-the-counter pain medicines, like aspirin and ibuprofen.  Having a family history of diverticulosis. What are the signs or symptoms? In most people, there are no symptoms of this condition. If you do have symptoms, they may include:  Bloating.  Cramps in the abdomen.  Constipation or diarrhea.  Pain in the lower left side of the abdomen. How is this diagnosed? This condition is most often diagnosed during an exam for other colon problems. Because diverticulosis usually has no symptoms, it often cannot be diagnosed independently. This condition may be diagnosed by:  Using a flexible scope to examine the colon  (colonoscopy).  Taking an X-ray of the colon after dye has been put into the colon (barium enema).  Doing a CT scan. How is this treated? You may not need treatment for this condition if you have never developed an infection related to diverticulosis. If you have had an infection before, treatment may include:  Eating a high-fiber diet. This may include eating more fruits, vegetables, and grains.  Taking a fiber supplement.  Taking a live bacteria supplement (probiotic).  Taking medicine to relax your colon.  Taking antibiotic medicines. Follow these instructions at home:  Drink 6-8 glasses of water or more  each day to prevent constipation.  Try not to strain when you have a bowel movement.  If you have had an infection before: ? Eat more fiber as directed by your health care provider or your diet and nutrition specialist (dietitian). ? Take a fiber supplement or probiotic, if your health care provider approves.  Take over-the-counter and prescription medicines only as told by your health care provider.  If you were prescribed an antibiotic, take it as told by your health care provider. Do not stop taking the antibiotic even if you start to feel better.  Keep all follow-up visits as told by your health care provider. This is important. Contact a health care provider if:  You have pain in your abdomen.  You have bloating.  You have cramps.  You have not had a bowel movement in 3 days. Get help right away if:  Your pain gets worse.  Your bloating becomes very bad.  You have a fever or chills, and your symptoms suddenly get worse.  You vomit.  You have bowel movements that are bloody or black.  You have bleeding from your rectum. Summary  Diverticulosis is a condition that develops when small pouches (diverticula) form in the wall of the large intestine (colon).  You may have a few pouches or many of them.  This condition is most often diagnosed during an exam  for other colon problems.  If you have had an infection related to diverticulosis, treatment may include increasing the fiber in your diet, taking supplements, or taking medicines. This information is not intended to replace advice given to you by your health care provider. Make sure you discuss any questions you have with your health care provider. Document Released: 08/03/2004 Document Revised: 09/25/2016 Document Reviewed: 09/25/2016 Elsevier Interactive Patient Education  2019 [employer].

## 2019-04-28 NOTE — Interval H&P Note (Signed)
History and Physical Interval Note:  04/28/2019 8:50 AM  [name]  has presented today for surgery, with the diagnosis of heme positive stool, hematochezia.  The various methods of treatment have been discussed with the patient and family. After consideration of risks, benefits and other options for treatment, the patient has consented to  Procedure(s) with comments: COLONOSCOPY WITH PROPOFOL (N/A) - 8:45am as a surgical intervention.  The patient's history has been reviewed, patient examined, no change in status, stable for surgery.  I have reviewed the patient's chart and labs.  Questions were answered to the patient's satisfaction.     Glennette Galster  No change.  Diagnostic colonoscopy per plan.  The risks, benefits, limitations, alternatives and imponderables have been reviewed with the patient. Questions have been answered. All parties are agreeable.

## 2019-04-28 NOTE — Op Note (Signed)
[facility] Patient Name: Seth Savage Procedure Date: 04/28/2019 8:50 AM MRN: [id number] Date of Birth: 1951/08/29 Attending MD: Gennette Pac , MD CSN: [id number] Age: 68 Admit Type: Outpatient Procedure:                Colonoscopy Indications:              Hematochezia Providers:                Gennette Pac, MD, Jannett Celestine, RN, Pandora Leiter, Technician Referring MD:              Medicines:                Propofol per Anesthesia Complications:            No immediate complications. Estimated Blood Loss:     Estimated blood loss: none. Procedure:                Pre-Anesthesia Assessment:                           - Prior to the procedure, a History and Physical                            was performed, and patient medications and                            allergies were reviewed. The patient's tolerance of                            previous anesthesia was also reviewed. The risks                            and benefits of the procedure and the sedation                            options and risks were discussed with the patient.                            All questions were answered, and informed consent                            was obtained. Prior Anticoagulants: The patient has                            taken no previous anticoagulant or antiplatelet                            agents. ASA Grade Assessment: II - A patient with                            mild systemic disease. After reviewing the risks                            and  benefits, the patient was deemed in                            satisfactory condition to undergo the procedure.                           After obtaining informed consent, the colonoscope                            was passed under direct vision. Throughout the                            procedure, the patient's blood pressure, pulse, and                            oxygen saturations were monitored  continuously. The                            CF-HQ190L ([id number]) scope was introduced through                            the anus and advanced to the the cecum, identified                            by appendiceal orifice and ileocecal valve. The                            colonoscopy was performed without difficulty. The                            patient tolerated the procedure well. The quality                            of the bowel preparation was adequate. The                            colonoscopy was performed without difficulty. The                            patient tolerated the procedure well. The quality                            of the bowel preparation was adequate. Scope In: 9:03:11 AM Scope Out: 9:13:58 AM Scope Withdrawal Time: 0 hours 7 minutes 48 seconds  Total Procedure Duration: 0 hours 10 minutes 47 seconds  Findings:      The perianal and digital rectal examinations were normal.      Internal hemorrhoids were found during retroflexion. The hemorrhoids       were moderate, medium-sized and Grade I (internal hemorrhoids that do       not prolapse).      Scattered medium-mouthed diverticula were found in the sigmoid colon and       descending colon.      The exam was otherwise without abnormality on direct and retroflexion  views. Impression:               - Internal hemorrhoids.                           - Diverticulosis in the sigmoid colon and in the                            descending colon.                           - The examination was otherwise normal on direct                            and retroflexion views.                           - No specimens collected. Moderate Sedation:      Moderate (conscious) sedation was personally administered by an       anesthesia professional. The following parameters were monitored: oxygen       saturation, heart rate, blood pressure, respiratory rate, EKG, adequacy       of pulmonary ventilation, and response  to care. Recommendation:           - Patient has a contact number available for                            emergencies. The signs and symptoms of potential                            delayed complications were discussed with the                            patient. Return to normal activities tomorrow.                            Written discharge instructions were provided to the                            patient.                           - Resume previous diet.                           - Repeat colonoscopy in 5 years for surveillance.                            Use [employer] hemorrhoid cream with                            nitroglycerin apply pea-sized amount to the                            anorectum 3 times a day as needed. Begin Benefiber  1 tablespoon daily for 3 weeks; then increase to                            twice daily thereafter.                           - Return to GI office in 3 months. Procedure Code(s):        --- Professional ---                           [phone], Colonoscopy, flexible; diagnostic, including                            collection of specimen(s) by brushing or washing,                            when performed (separate procedure) Diagnosis Code(s):        --- Professional ---                           K64.0, First degree hemorrhoids                           K92.1, Melena (includes Hematochezia)                           K57.30, Diverticulosis of large intestine without                            perforation or abscess without bleeding CPT copyright 2019 American Medical Association. All rights reserved. The codes documented in this report are preliminary and upon coder review may  be revised to meet current compliance requirements. Gerrit Friends. Aariyana Manz, MD Gennette Pac, MD 04/28/2019 9:21:21 AM This report has been signed electronically. Number of Addenda: 0

## 2019-04-28 NOTE — Transfer of Care (Signed)
Immediate Anesthesia Transfer of Care Note  Patient: Seth Savage  Procedure(s) Performed: COLONOSCOPY WITH PROPOFOL (N/A )  Patient Location: PACU  Anesthesia Type:General  Level of Consciousness: awake  Airway & Oxygen Therapy: Patient Spontanous Breathing  Post-op Assessment: Report given to RN  Post vital signs: Reviewed and stable  Last Vitals:  Vitals Value Taken Time  BP 105/88 04/28/2019  9:20 AM  Temp    Pulse 74 04/28/2019  9:22 AM  Resp 18 04/28/2019  9:22 AM  SpO2 99 % 04/28/2019  9:22 AM  Vitals shown include unvalidated device data.  Last Pain:  Vitals:   04/28/19 0758  TempSrc: Oral  PainSc: 0-No pain      Patients Stated Pain Goal: 8 (35/46/56 8127)  Complications: No apparent anesthesia complications

## 2019-05-05 ENCOUNTER — Encounter (HOSPITAL_COMMUNITY): Payer: Self-pay | Admitting: Internal Medicine

## 2019-08-06 ENCOUNTER — Ambulatory Visit: Payer: Medicare Other | Admitting: Gastroenterology

## 2019-08-31 ENCOUNTER — Encounter: Payer: Self-pay | Admitting: Gastroenterology

## 2019-08-31 NOTE — Progress Notes (Signed)
Referring Provider: Glenda Chroman, MD Primary Care Physician:  Glenda Chroman, MD Primary GI Physician: Dr. Gala Romney  Chief Complaint  Patient presents with  . Hematochezia    f/u. Doing fine    HPI:   Seth Savage is a 68 y.o. male with past medical history of GERD, PUD secondary to H. pylori status post documented eradication by biopsies in 2018, chronic right upper quadrant abdominal pain predating cholecystectomy in 2019, partial colectomy for intussusception in 2000, and colonic polyps.  He is presenting for post procedure follow-up.  Patient was last seen in our office on 04/22/19 for intermittent painless hematochezia.  Colonoscopy on 04/28/2019 with moderate, medium sized grade 1 internal hemorrhoids, diverticulosis in the sigmoid and descending colon, otherwise normal.  Repeat colonoscopy in 5 years. [facility] hemorrhoid cream with nitroglycerin 3 times daily as needed.  Begin Benefiber 1 tablespoon daily for 3 weeks then twice daily thereafter.  Follow-up in 3 months.   Today he states he doesn't have any rectal bleeding. Used [employer] cream. Not having to use at this time. BM daily. Stools are formed to mushy. Usually Bristol 4. Not using benefiber. No constipation. Hemorrhoids are causing him no trouble at this time. Chronic RUQ abdominal pain. Doesn't hurt all the time. Comes and goes. May last 10-15 minutes. Just annoying. Not worsening. No certain food triggers. GERD symptoms are well controlled. Taking Protonix daily before breakfast. No dysphagia. No nausea or vomiting. No black stools. No lower abdominal pain. No unintentional weight loss. No NSAIDs.     Past Medical History:  Diagnosis Date  . Arthritis    "wrists, thumb on left hand" (03/12/2018)  . Back injury 2003   "broke my back in 2 places; never had any OR for it" (03/12/2018)  . Carotid artery occlusion   . Childhood asthma   . COPD (chronic obstructive pulmonary disease) ([place])   . Essential  hypertension   . GERD (gastroesophageal reflux disease)   . Goiter    Radioactive iodine treatment  . H pylori ulcer Jan 2016   Treated with Prevpac  . History of bronchitis   . History of kidney stones   . Hypercholesterolemia   . Hypertension   . Hypothyroid   . Migraine    "been about 2 yr w/out a headache" (03/12/2018)  . Neuropathy   . Pneumonia 1969  . Shortness of breath dyspnea    with exertion  . Wears glasses     Past Surgical History:  Procedure Laterality Date  . BIOPSY  05/01/2017   Procedure: BIOPSY;  Surgeon: Daneil Dolin, MD;  Location: [facility];  Service: Endoscopy;;  gastric  . BIOPSY  08/19/2018   Procedure: BIOPSY;  Surgeon: Daneil Dolin, MD;  Location: [facility];  Service: Endoscopy;;  duodenal and gastric  . CARDIAC CATHETERIZATION N/A 07/05/2016   Procedure: Left Heart Cath and Coronary Angiography;  Surgeon: Burnell Blanks, MD;  Location: [place] [facility];  Service: Cardiovascular;  Laterality: N/A;  . CARDIAC CATHETERIZATION  03/12/2018  . CAROTID ENDARTERECTOMY Left ?2009  . CARPAL TUNNEL RELEASE Right   . CHOLECYSTECTOMY N/A 04/24/2018   Procedure: LAPAROSCOPIC CHOLECYSTECTOMY;  Surgeon: Aviva Signs, MD;  Location: [facility];  Service: General;  Laterality: N/A;  . COLECTOMY  2000   Secondary to intussuception  . COLONOSCOPY  08/24/2004   Normal rectum,Small polyp at 25 cm, cold snared/ The remainder of the colonic mucosa appeared normal  . COLONOSCOPY  09/24/2012   Dr. Rourk:Internal hemorrhoids-likely source of hematochezia.Colonic diverticulosis  . COLONOSCOPY N/A 05/01/2017   Dr. Gala Romney: Diverticulosis, next colonoscopy in 5 years.  . COLONOSCOPY WITH PROPOFOL N/A 04/28/2019   Propofol; Dr. Gala Romney; moderate, medium sized grade 1 internal hemorrhoids, diverticulosis in sigmoid and descending colon, otherwise normal.  Repeat in 5 years.  Marland Kitchen ENDARTERECTOMY Right 07/07/2016   Procedure: ENDARTERECTOMY CAROTID;  Surgeon: Rosetta Posner,  MD;  Location: [facility];  Service: Vascular;  Laterality: Right;  . ESOPHAGOGASTRODUODENOSCOPY N/A 12/09/2014   Dr. Gala Romney: Erosive reflux esophagitis. Peptic Ulcer disease secondary to H.pylori. small hiatal hernia. Nonbleeding duodenal AVM. Duodenal erosions. TREATED WITH PREVPAC  . ESOPHAGOGASTRODUODENOSCOPY N/A 03/15/2015   Dr. Gala Romney: small hiatal hernia, healed PUD, duodenal AVM.   Marland Kitchen ESOPHAGOGASTRODUODENOSCOPY N/A 05/01/2017   Dr. Gala Romney: LA grade a esophagitis, gastritis, no H pylori  . ESOPHAGOGASTRODUODENOSCOPY (EGD) WITH PROPOFOL N/A 08/19/2018   PROPOFOL;  Surgeon: Daneil Dolin, MD; normal esophagus, mild erythema and erosions in the entire stomach, small hiatal hernia, subtle white papular areas and duodenal bulb second and third portions of the duodenum.  . FRACTURE SURGERY    . LEFT HEART CATH AND CORONARY ANGIOGRAPHY N/A 03/12/2018   Procedure: LEFT HEART CATH AND CORONARY ANGIOGRAPHY;  Surgeon: Burnell Blanks, MD;  Location: [place] [facility];  Service: Cardiovascular;  Laterality: N/A;  . Multiple bilateral arm/wrist surgeries after falls    . ORIF SHOULDER FRACTURE Left 06/2002   "shattered; put a bunch of metal plates in it"  . PATCH ANGIOPLASTY Right 07/07/2016   Procedure: PATCH ANGIOPLASTY USING 0.8CM X 7.6CM HEMASHIELD PATCH;  Surgeon: Rosetta Posner, MD;  Location: [place];  Service: Vascular;  Laterality: Right;  . SHOULDER ARTHROSCOPY W/ ROTATOR CUFF REPAIR Right 1999  . SHOULDER OPEN ROTATOR CUFF REPAIR Left 1992   Metal implant     Current Outpatient Medications  Medication Sig Dispense Refill  . amLODipine (NORVASC) 5 MG tablet Take 1 tablet (5 mg total) by mouth daily. 90 tablet 3  . aspirin EC 81 MG tablet Take 81 mg by mouth at bedtime.     Marland Kitchen atorvastatin (LIPITOR) 10 MG tablet Take 1 tablet (10 mg total) by mouth daily. 30 tablet 11  . fluticasone (FLONASE) 50 MCG/ACT nasal spray Place 2 sprays into both nostrils daily as needed for allergies.     .  hydrochlorothiazide (HYDRODIURIL) 12.5 MG tablet Take 12.5 mg by mouth daily.    . isosorbide dinitrate (ISORDIL) 20 MG tablet Take 1 tablet (20 mg total) by mouth 3 (three) times daily. 270 tablet 3  . levothyroxine (SYNTHROID, LEVOTHROID) 150 MCG tablet Take 150 mcg by mouth daily before breakfast.     . losartan (COZAAR) 50 MG tablet Take 1 tablet (50 mg total) by mouth daily. 90 tablet 3  . metoprolol succinate (TOPROL XL) 25 MG 24 hr tablet Take 1 tablet (25 mg total) by mouth daily. 90 tablet 0  . pantoprazole (PROTONIX) 40 MG tablet Take 40 mg by mouth daily.     No current facility-administered medications for this visit.     Allergies as of 09/01/2019 - Review Complete 09/01/2019  Allergen Reaction Noted  . Lyrica [pregabalin] Shortness Of Breath 09/17/2012  . Celecoxib Other (See Comments)   . Codeine Itching 11/09/2014    Family History  Problem Relation Age of Onset  . COPD Mother   . Mental illness Mother   . Hypertension Mother   . Varicose Veins Mother   .  Heart attack Mother 79  . Hypertension Father   . Alcohol abuse Father   . Colon cancer Neg Hx     Social History   Socioeconomic History  . Marital status: Married    Spouse name: [name]  . Number of children: 0  . Years of education: Ged  . Highest education level: Not on file  Occupational History    Comment: Disabled  Social Needs  . Financial resource strain: Not on file  . Food insecurity    Worry: Not on file    Inability: Not on file  . Transportation needs    Medical: Not on file    Non-medical: Not on file  Tobacco Use  . Smoking status: Former Smoker    Packs/day: 0.20    Years: 45.00    Pack years: 9.00    Types: Cigarettes    Quit date: 12/21/2017    Years since quitting: 1.6  . Smokeless tobacco: Never Used  Substance and Sexual Activity  . Alcohol use: Yes    Alcohol/week: 0.0 standard drinks    Comment: 1-2 beers in 6 months.   . Drug use: No  . Sexual activity: Not Currently   Lifestyle  . Physical activity    Days per week: Not on file    Minutes per session: Not on file  . Stress: Not on file  Relationships  . Social Herbalist on phone: Not on file    Gets together: Not on file    Attends religious service: Not on file    Active member of club or organization: Not on file    Attends meetings of clubs or organizations: Not on file    Relationship status: Not on file  Other Topics Concern  . Not on file  Social History Narrative   Patient lives at home with his wife. [facility]).    Disabled.   Right handed.   Caffeine- two cups daily.   Three step children.    Review of Systems: Gen: Denies fever, chills, lightheadedness, dizziness, or feeling like he will pass  CV: Denies chest pain, palpitations Resp: Denies dyspnea at rest, some shortness of breath with exertion. No chronic cough.   GI: See HPI Derm: Denies rash Psych: Denies depression, anxiety Heme: Denies bruising, bleeding  Physical Exam: BP 119/67   Pulse 70   Temp (!) 97.1 F (36.2 C) (Oral)   Ht 6' (1.829 m)   Wt 227 lb 9.6 oz (103.2 kg)   BMI 30.87 kg/m  General:   Alert and oriented. No distress noted. Pleasant and cooperative.  Head:  Normocephalic and atraumatic. Eyes:  Conjuctiva clear without scleral icterus. Heart:  S1, S2 present without murmurs appreciated. Lungs:  Clear to auscultation bilaterally. No wheezes, rales, or rhonchi. No distress.  Abdomen:  +BS, soft, non-tender and non-distended. No rebound or guarding. No HSM or masses noted. Msk:  Symmetrical without gross deformities. Normal posture. Extremities:  Without edema. Neurologic:  Alert and  oriented x4 Psych: Normal mood and affect.

## 2019-09-01 ENCOUNTER — Other Ambulatory Visit: Payer: Self-pay

## 2019-09-01 ENCOUNTER — Ambulatory Visit: Payer: Medicare Other | Admitting: Gastroenterology

## 2019-09-01 ENCOUNTER — Encounter: Payer: Self-pay | Admitting: Gastroenterology

## 2019-09-01 VITALS — BP 119/67 | HR 70 | Temp 97.1°F | Ht 72.0 in | Wt 227.6 lb

## 2019-09-01 DIAGNOSIS — K219 Gastro-esophageal reflux disease without esophagitis: Secondary | ICD-10-CM

## 2019-09-01 DIAGNOSIS — K921 Melena: Secondary | ICD-10-CM | POA: Diagnosis not present

## 2019-09-01 NOTE — Assessment & Plan Note (Signed)
Hematochezia has resolved.  Colonoscopy on 04/28/2019 for evaluation with moderate, medium size grade 1 internal hemorrhoids, diverticulosis in the sigmoid and descending colon, otherwise normal.  Recommendations to repeat colonoscopy in 5 years.  Patient used [employer] hemorrhoid cream with resolution of bleeding.  Hemorrhoids are not causing him any trouble at this time.  Bowel movements daily, usually Bristol 4.  No other alarm symptoms.  Follow-up in 6 months.

## 2019-09-01 NOTE — Assessment & Plan Note (Addendum)
GERD symptoms are well controlled on Protonix 40 mg daily.  No breakthrough symptoms.  No significant changes in chronic intermittent right upper quadrant abdominal pain that predates his cholecystectomy in 2019.  This has been evaluated with EGD in 2019 and follow-up ultrasound without any significant findings.  No alarm symptoms.  Benign abdominal exam.  Continue Protonix 40 mg daily 30 minutes before breakfast. Continue to avoid NSAIDs. Follow-up in 6 months.

## 2019-09-01 NOTE — Patient Instructions (Signed)
Continue taking Protonix 40 mg 30 minutes before breakfast.  Continue to use [facility] hemorrhoid cream as needed.  Continue to avoid all NSAIDs including ibuprofen, Aleve, Advil, Goody powders.  We will plan to see you back in 6 months.  If anything changes or you have questions or concerns prior to next visit do not hesitate to call.  Aliene Altes, PA-C [facility]

## 2019-10-29 ENCOUNTER — Encounter: Payer: Self-pay | Admitting: Student

## 2019-10-29 ENCOUNTER — Telehealth (INDEPENDENT_AMBULATORY_CARE_PROVIDER_SITE_OTHER): Payer: Medicare Other | Admitting: Student

## 2019-10-29 ENCOUNTER — Encounter: Payer: Self-pay | Admitting: *Deleted

## 2019-10-29 VITALS — BP 133/71 | Ht 72.0 in | Wt 228.0 lb

## 2019-10-29 DIAGNOSIS — I201 Angina pectoris with documented spasm: Secondary | ICD-10-CM | POA: Diagnosis not present

## 2019-10-29 DIAGNOSIS — I10 Essential (primary) hypertension: Secondary | ICD-10-CM | POA: Diagnosis not present

## 2019-10-29 DIAGNOSIS — I6523 Occlusion and stenosis of bilateral carotid arteries: Secondary | ICD-10-CM | POA: Diagnosis not present

## 2019-10-29 DIAGNOSIS — E785 Hyperlipidemia, unspecified: Secondary | ICD-10-CM | POA: Diagnosis not present

## 2019-10-29 NOTE — Progress Notes (Signed)
Virtual Visit via Telephone Note   This visit type was conducted due to national recommendations for restrictions regarding the COVID-19 Pandemic (e.g. social distancing) in an effort to limit this patient's exposure and mitigate transmission in our community.  Due to his co-morbid illnesses, this patient is at least at moderate risk for complications without adequate follow up.  This format is felt to be most appropriate for this patient at this time.  The patient did not have access to video technology/had technical difficulties with video requiring transitioning to audio format only (telephone).  All issues noted in this document were discussed and addressed.  No physical exam could be performed with this format.  Please refer to the patient's chart for his  consent to telehealth for [facility].   Date:  10/29/2019   ID:  Seth Savage, DOB [birth year], MRN [id number]  Patient Location: Home Provider Location: Office  PCP:  Glenda Chroman, MD  Cardiologist:  Kate Sable, MD  Electrophysiologist:  None   Evaluation Performed:  Follow-Up Visit  Chief Complaint: Follow-up Visit  History of Present Illness:    Seth Savage is a 68 y.o. 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   He was last examined by Dr. Bronson Ing in 12/2018 and denied any exertional chest pain but did experience episodes of chest pain in the early AM hours which would resolve within 10 minutes after taking his AM medications. His symptoms were thought to be most consistent with Prinzmetal angina and he was continued on Amlodipine 5mg  Savage and Isordil was increased to 20mg  TID.   In talking with the patient today, he reports overall doing well since his last office visit. He denies any recurrent episodes of chest pain. No recent orthopnea,  PND, lower extremity edema or palpitations. His breathing has been at baseline and he reports this significantly improved after he quit smoking approximately 3 years ago.  He has been cautious given COVID-19 and reports wearing masks when out in public and washing his hands very frequently.  The patient does not have symptoms concerning for COVID-19 infection (fever, chills, cough, or new shortness of breath).    Past Medical History:  Diagnosis Date  . Arthritis    "wrists, thumb on left hand" (03/12/2018)  . Back injury 2003   "broke my back in 2 places; never had any OR for it" (03/12/2018)  . Carotid artery occlusion   . Childhood asthma   . COPD (chronic obstructive pulmonary disease) ([place])   . Essential hypertension   . GERD (gastroesophageal reflux disease)   . Goiter    Radioactive iodine treatment  . H pylori ulcer Jan 2016   Treated with Prevpac  . History of bronchitis   . History of kidney stones   . Hypercholesterolemia   . Hypertension   . Hypothyroid   . Migraine    "been about 2 yr w/out a headache" (03/12/2018)  . Neuropathy   . Pneumonia 1969  . Shortness of breath dyspnea    with exertion  . Wears glasses    Past Surgical History:  Procedure Laterality Date  . BIOPSY  05/01/2017   Procedure: BIOPSY;  Surgeon: Daneil Dolin, MD;  [facility];  Service: Endoscopy;;  gastric  . BIOPSY  08/19/2018   Procedure: BIOPSY;  Surgeon: Daneil Dolin,  MD;  [facility];  Service: Endoscopy;;  duodenal and gastric  . CARDIAC CATHETERIZATION N/A 07/05/2016   Procedure: Left Heart Cath and Coronary Angiography;  Surgeon: Burnell Blanks, MD;  Location: [place] CV LAB;  Service: Cardiovascular;  Laterality: N/A;  . CARDIAC CATHETERIZATION  03/12/2018  . CAROTID ENDARTERECTOMY Left ?2009  . CARPAL TUNNEL RELEASE Right   . CHOLECYSTECTOMY N/A 04/24/2018   Procedure: LAPAROSCOPIC CHOLECYSTECTOMY;  Surgeon: Aviva Signs, MD;  Location: [facility]  ORS;  Service: General;  Laterality: N/A;  . COLECTOMY  2000   Secondary to intussuception  . COLONOSCOPY  08/24/2004   Normal rectum,Small polyp at 25 cm, cold snared/ The remainder of the colonic mucosa appeared normal  . COLONOSCOPY  09/24/2012   Dr. Mike Craze hemorrhoids-likely source of hematochezia.Colonic diverticulosis  . COLONOSCOPY N/A 05/01/2017   Dr. Gala Romney: Diverticulosis, next colonoscopy in 5 years.  . COLONOSCOPY WITH PROPOFOL N/A 04/28/2019   Propofol; Dr. Gala Romney; moderate, medium sized grade 1 internal hemorrhoids, diverticulosis in sigmoid and descending colon, otherwise normal.  Repeat in 5 years.  Marland Kitchen ENDARTERECTOMY Right 07/07/2016   Procedure: ENDARTERECTOMY CAROTID;  Surgeon: Rosetta Posner, MD;  Location: [facility] OR;  Service: Vascular;  Laterality: Right;  . ESOPHAGOGASTRODUODENOSCOPY N/A 12/09/2014   Dr. Gala Romney: Erosive reflux esophagitis. Peptic Ulcer disease secondary to H.pylori. small hiatal hernia. Nonbleeding duodenal AVM. Duodenal erosions. TREATED WITH PREVPAC  . ESOPHAGOGASTRODUODENOSCOPY N/A 03/15/2015   Dr. Gala Romney: small hiatal hernia, healed PUD, duodenal AVM.   Marland Kitchen ESOPHAGOGASTRODUODENOSCOPY N/A 05/01/2017   Dr. Gala Romney: LA grade a esophagitis, gastritis, no H pylori  . ESOPHAGOGASTRODUODENOSCOPY (EGD) WITH PROPOFOL N/A 08/19/2018   PROPOFOL;  Surgeon: Daneil Dolin, MD; normal esophagus, mild erythema and erosions in the entire stomach, small hiatal hernia, subtle white papular areas and duodenal bulb second and third portions of the duodenum.  . FRACTURE SURGERY    . LEFT HEART CATH AND CORONARY ANGIOGRAPHY N/A 03/12/2018   Procedure: LEFT HEART CATH AND CORONARY ANGIOGRAPHY;  Surgeon: Burnell Blanks, MD;  Location: [place] CV LAB;  Service: Cardiovascular;  Laterality: N/A;  . Multiple bilateral arm/wrist surgeries after falls    . ORIF SHOULDER FRACTURE Left 06/2002   "shattered; put a bunch of metal plates in it"  . PATCH ANGIOPLASTY Right 07/07/2016    Procedure: PATCH ANGIOPLASTY USING 0.8CM X 7.6CM HEMASHIELD PATCH;  Surgeon: Rosetta Posner, MD;  Location: [place];  Service: Vascular;  Laterality: Right;  . SHOULDER ARTHROSCOPY W/ ROTATOR CUFF REPAIR Right 1999  . SHOULDER OPEN ROTATOR CUFF REPAIR Left 1992   Metal implant      Current Meds  Medication Sig  . amLODipine (NORVASC) 5 MG tablet Take 1 tablet (5 mg total) by mouth Savage.  Marland Kitchen aspirin EC 81 MG tablet Take 81 mg by mouth at bedtime.   Marland Kitchen atorvastatin (LIPITOR) 10 MG tablet Take 1 tablet (10 mg total) by mouth Savage.  . fluticasone (FLONASE) 50 MCG/ACT nasal spray Place 2 sprays into both nostrils Savage as needed for allergies.   . hydrochlorothiazide (HYDRODIURIL) 12.5 MG tablet Take 12.5 mg by mouth Savage.  . isosorbide dinitrate (ISORDIL) 20 MG tablet Take 1 tablet (20 mg total) by mouth 3 (three) times Savage.  Marland Kitchen levothyroxine (SYNTHROID, LEVOTHROID) 150 MCG tablet Take 150 mcg by mouth Savage before breakfast.   . losartan (COZAAR) 50 MG tablet Take 1 tablet (50 mg total) by mouth Savage.  . metoprolol succinate (TOPROL XL) 25 MG 24 hr tablet  Take 1 tablet (25 mg total) by mouth Savage.  . pantoprazole (PROTONIX) 40 MG tablet Take 40 mg by mouth Savage.     Allergies:   Lyrica [pregabalin], Celecoxib, and Codeine   Social History   Tobacco Use  . Smoking status: Former Smoker    Packs/day: 0.20    Years: 45.00    Pack years: 9.00    Types: Cigarettes    Quit date: 12/21/2017    Years since quitting: 1.8  . Smokeless tobacco: Never Used  Substance Use Topics  . Alcohol use: Yes    Alcohol/week: 0.0 standard drinks    Comment: 1-2 beers in 6 months.   . Drug use: No     Family Hx: The patient's family history includes Alcohol abuse in his father; COPD in his mother; Heart attack (age of onset: 106) in his mother; Hypertension in his father and mother; Mental illness in his mother; Varicose Veins in his mother. There is no history of Colon cancer.  ROS:   Please see the  history of present illness.     All other systems reviewed and are negative.   Prior CV studies:   The following studies were reviewed today:  Cardiac Catheterization: 02/2018  Prox RCA lesion is 30% stenosed.  Post Atrio lesion is 40% stenosed.  Ost RPDA lesion is 20% stenosed.  Ost 1st Mrg lesion is 30% stenosed.  Ost 2nd Mrg lesion is 30% stenosed.  Ost Cx to Prox Cx lesion is 20% stenosed.  Ost LAD to Prox LAD lesion is 30% stenosed.  The left ventricular systolic function is normal.  LV end diastolic pressure is normal.  The left ventricular ejection fraction is 55-65% by visual estimate.  There is no mitral valve regurgitation.   1. Mild non-obstructive CAD 2. Normal LV systolic function  Recommendations: consider addition of long acting nitrate. Consider non-cardiac causes of chest pain. Continue medical management of mild CAD   Event Monitor: 05/2018  Sinus rhythm and sinus bradycardia with rare PVCs. No significant arrhythmias.   Echocardiogram: 06/2018 Study Conclusions  - Left ventricle: The cavity size was normal. Wall thickness was   normal. Systolic function was normal. The estimated ejection   fraction was in the range of 55% to 60%. Wall motion was normal;   there were no regional wall motion abnormalities. Doppler   parameters are consistent with abnormal left ventricular   relaxation (grade 1 diastolic dysfunction). Doppler parameters   are consistent with indeterminate ventricular filling pressure. - Aortic valve: Mildly calcified annulus. Trileaflet. - Mitral valve: There was mild regurgitation.  Labs/Other Tests and Data Reviewed:    EKG:  No ECG reviewed.  Recent Labs: 04/24/2019: BUN 25; Creatinine, Ser 1.07; Potassium 3.6; Sodium 137   Recent Lipid Panel Lab Results  Component Value Date/Time   CHOL 119 03/13/2018 02:56 AM   TRIG 213 (H) 03/13/2018 02:56 AM   HDL 29 (L) 03/13/2018 02:56 AM   CHOLHDL 4.1 03/13/2018 02:56 AM    LDLCALC 47 03/13/2018 02:56 AM    Wt Readings from Last 3 Encounters:  10/29/19 228 lb (103.4 kg)  09/01/19 227 lb 9.6 oz (103.2 kg)  04/28/19 223 lb (101.2 kg)     Objective:    Vital Signs:  BP 133/71   Ht 6' (1.829 m)   Wt 228 lb (103.4 kg)   BMI 30.92 kg/m    General: Pleasant male sounding in NAD Psych: Normal affect. Neuro: Alert and oriented X 3.  Lungs:  Resp regular and unlabored while talking on the phone.   ASSESSMENT & PLAN:    1. Prinzmetal Angina - Cardiac catheterization in 02/2018 showed mild nonobstructive CAD as outlined above. He denies any recurrent chest discomfort following titration of medical therapy at the time of his last visit. - Continue current medication regimen at this time with ASA, Atorvastatin, Toprol-XL 25mg  Savage, Amlodipine 5 mg Savage, and Isosorbide Dinitrate 20 mg TID.  2. HTN - BP was well controlled at 133/71 on most recent check. Continue current medication regimen with Amlodipine 5 mg Savage, HCTZ 12.5 mg Savage, Isosorbide Dinitrate 20 mg TID, Toprol-XL 25mg  Savage and Losartan 50 mg Savage.  3. HLD - Followed by PCP and he reports having recent labs within the past few months. Will request a copy of these.  He remains on Atorvastatin 10 mg Savage.  4. Carotid Artery Stenosis - s/p bilateral CEA and followed by Vascular Surgery. By review of records, most recent Doppler study was in 07/2017 and showed 1 to 39% stenosis bilaterally.  He will need to reestablish with Vascular Surgery within the next year once COVID-19 has improved as his prior appointment in 02/2019 was canceled due to the pandemic.   COVID-19 Education: The signs and symptoms of COVID-19 were discussed with the patient and how to seek care for testing (follow up with PCP or arrange E-visit).  The importance of social distancing was discussed today.  Time:   Today, I have spent 16 minutes with the patient with telehealth technology discussing the above problems.      Medication Adjustments/Labs and Tests Ordered: Current medicines are reviewed at length with the patient today.  Concerns regarding medicines are outlined above.   Tests Ordered: No orders of the defined types were placed in this encounter.   Medication Changes: No orders of the defined types were placed in this encounter.   Follow Up:  In Person in 1 year(s)  Signed, Erma Heritage, PA-C  10/29/2019 4:36 PM    [facility]

## 2019-10-29 NOTE — Patient Instructions (Signed)
Medication Instructions:  Your physician recommends that you continue on your current medications as directed. Please refer to the Current Medication list given to you today.   Labwork: NONE  Testing/Procedures: NONE  Follow-Up: Your physician wants you to follow-up in: 1 Year With Dr. Bronson Ing. You will receive a reminder letter in the mail two months in advance. If you don't receive a letter, please call our office to schedule the follow-up appointment.   Any Other Special Instructions Will Be Listed Below (If Applicable).     If you need a refill on your cardiac medications before your next appointment, please call your pharmacy.  Thank you for choosing [facility]!

## 2019-12-09 DIAGNOSIS — I472 Ventricular tachycardia: Secondary | ICD-10-CM | POA: Diagnosis not present

## 2019-12-09 DIAGNOSIS — M545 Low back pain: Secondary | ICD-10-CM | POA: Diagnosis not present

## 2019-12-09 DIAGNOSIS — I700 Atherosclerosis of aorta: Secondary | ICD-10-CM | POA: Diagnosis not present

## 2019-12-09 DIAGNOSIS — I10 Essential (primary) hypertension: Secondary | ICD-10-CM | POA: Diagnosis not present

## 2019-12-09 DIAGNOSIS — Z299 Encounter for prophylactic measures, unspecified: Secondary | ICD-10-CM | POA: Diagnosis not present

## 2019-12-09 DIAGNOSIS — Z87891 Personal history of nicotine dependence: Secondary | ICD-10-CM | POA: Diagnosis not present

## 2020-02-06 DIAGNOSIS — Z1211 Encounter for screening for malignant neoplasm of colon: Secondary | ICD-10-CM | POA: Diagnosis not present

## 2020-02-06 DIAGNOSIS — Z0000 Encounter for general adult medical examination without abnormal findings: Secondary | ICD-10-CM | POA: Diagnosis not present

## 2020-02-06 DIAGNOSIS — Z299 Encounter for prophylactic measures, unspecified: Secondary | ICD-10-CM | POA: Diagnosis not present

## 2020-02-06 DIAGNOSIS — I10 Essential (primary) hypertension: Secondary | ICD-10-CM | POA: Diagnosis not present

## 2020-02-06 DIAGNOSIS — Z7189 Other specified counseling: Secondary | ICD-10-CM | POA: Diagnosis not present

## 2020-02-09 DIAGNOSIS — Z79899 Other long term (current) drug therapy: Secondary | ICD-10-CM | POA: Diagnosis not present

## 2020-02-09 DIAGNOSIS — R5383 Other fatigue: Secondary | ICD-10-CM | POA: Diagnosis not present

## 2020-02-09 DIAGNOSIS — E7800 Pure hypercholesterolemia, unspecified: Secondary | ICD-10-CM | POA: Diagnosis not present

## 2020-02-09 DIAGNOSIS — E039 Hypothyroidism, unspecified: Secondary | ICD-10-CM | POA: Diagnosis not present

## 2020-02-28 NOTE — Progress Notes (Signed)
Referring Provider: Glenda Chroman, MD Primary Care Physician:  Glenda Chroman, MD Primary GI Physician: Dr. Gala Romney  Chief Complaint  Patient presents with  . Hematochezia    had positive stool test 1-2 weeks ago at PCP    HPI:   Seth Savage is a 69 y.o. male with past medical history of GERD, PUD secondary to H. pylori status post documented eradication by biopsies in 2018, chronic right upper quadrant abdominal pain predating cholecystectomy in 2019, partial colectomy for intussusception in 2000, and colonic polyps. Last colonoscopy on 04/28/2019 with moderate, medium sized grade 1 internal hemorrhoids, diverticulosis in the sigmoid and descending colon, otherwise normal.  Due for repeat TCS in 2025.   Last seen in our office on 09/01/19 for follow-up after colonoscopy. Hematochezia and all hemorrhoid symptoms had resolved with [place] [facility] hemorrhoid cream. GERD symptoms were well controlled on Protonix daily. Continued with chronic mild intermittent RUQ pain without identified triggers and no worsening of symptoms. He was advised to continue Protonix, use hemorrhoid cream as needed, and avoid NSAIDs. F/U in 6 months.   Today: Had a positive stool test 1-2 weeks ago for with PCP. States he was having some trouble with his hemorrhoids when he had the test. Small amount of bright red blood on toilet tissue. Used the hemorrhoid cream from [employer] No rectal bleeding at this time. No rectal pain. No constipation. Stools are soft. BMs daily. Satisfied with using hemorrhoid cream. No unintentional weight loss.   GERD is well controlled on Protonix. No dysphagia. No nausea or vomiting.  No melena.  No other GI concerns at this time.   Has allergies. Some drainage in his throat. Eyes get watery and itchy.   No fever or chills. No lightheadedness, dizziness, pre-syncope, or syncope. No chest pain or heart palpitations. No shortness of breath or cough.   Past Medical History:   Diagnosis Date  . Arthritis    "wrists, thumb on left hand" (03/12/2018)  . Back injury 2003   "broke my back in 2 places; never had any OR for it" (03/12/2018)  . Carotid artery occlusion   . Childhood asthma   . COPD (chronic obstructive pulmonary disease) ([place])   . Essential hypertension   . GERD (gastroesophageal reflux disease)   . Goiter    Radioactive iodine treatment  . H pylori ulcer Jan 2016   Treated with Prevpac  . History of bronchitis   . History of kidney stones   . Hypercholesterolemia   . Hypertension   . Hypothyroid   . Migraine    "been about 2 yr w/out a headache" (03/12/2018)  . Neuropathy   . Pneumonia 1969  . Shortness of breath dyspnea    with exertion  . Wears glasses     Past Surgical History:  Procedure Laterality Date  . BIOPSY  05/01/2017   Procedure: BIOPSY;  Surgeon: Daneil Dolin, MD;  [facility];  Service: Endoscopy;;  gastric  . BIOPSY  08/19/2018   Procedure: BIOPSY;  Surgeon: Daneil Dolin, MD;  [facility];  Service: Endoscopy;;  duodenal and gastric  . CARDIAC CATHETERIZATION N/A 07/05/2016   Procedure: Left Heart Cath and Coronary Angiography;  Surgeon: Burnell Blanks, MD;  Location: [place] CV LAB;  Service: Cardiovascular;  Laterality: N/A;  . CARDIAC CATHETERIZATION  03/12/2018  . CAROTID ENDARTERECTOMY Left ?2009  . CARPAL TUNNEL RELEASE Right   . CHOLECYSTECTOMY N/A 04/24/2018   Procedure:  LAPAROSCOPIC CHOLECYSTECTOMY;  Surgeon: Aviva Signs, MD;  [facility];  Service: General;  Laterality: N/A;  . COLECTOMY  2000   Secondary to intussuception  . COLONOSCOPY  08/24/2004   Normal rectum,Small polyp at 25 cm, cold snared/ The remainder of the colonic mucosa appeared normal  . COLONOSCOPY  09/24/2012   Dr. Mike Craze hemorrhoids-likely source of hematochezia.Colonic diverticulosis  . COLONOSCOPY N/A 05/01/2017   Dr. Gala Romney: Diverticulosis, next colonoscopy in 5 years.  . COLONOSCOPY  WITH PROPOFOL N/A 04/28/2019   Propofol; Dr. Gala Romney; moderate, medium sized grade 1 internal hemorrhoids, diverticulosis in sigmoid and descending colon, otherwise normal.  Repeat in 5 years.  Marland Kitchen ENDARTERECTOMY Right 07/07/2016   Procedure: ENDARTERECTOMY CAROTID;  Surgeon: Rosetta Posner, MD;  [facility];  Service: Vascular;  Laterality: Right;  . ESOPHAGOGASTRODUODENOSCOPY N/A 12/09/2014   Dr. Gala Romney: Erosive reflux esophagitis. Peptic Ulcer disease secondary to H.pylori. small hiatal hernia. Nonbleeding duodenal AVM. Duodenal erosions. TREATED WITH PREVPAC  . ESOPHAGOGASTRODUODENOSCOPY N/A 03/15/2015   Dr. Gala Romney: small hiatal hernia, healed PUD, duodenal AVM.   Marland Kitchen ESOPHAGOGASTRODUODENOSCOPY N/A 05/01/2017   Dr. Gala Romney: LA grade a esophagitis, gastritis, no H pylori  . ESOPHAGOGASTRODUODENOSCOPY (EGD) WITH PROPOFOL N/A 08/19/2018   PROPOFOL;  Surgeon: Daneil Dolin, MD; normal esophagus, mild erythema and erosions in the entire stomach, small hiatal hernia, subtle white papular areas and duodenal bulb second and third portions of the duodenum.  . FRACTURE SURGERY    . LEFT HEART CATH AND CORONARY ANGIOGRAPHY N/A 03/12/2018   Procedure: LEFT HEART CATH AND CORONARY ANGIOGRAPHY;  Surgeon: Burnell Blanks, MD;  Location: [place] CV LAB;  Service: Cardiovascular;  Laterality: N/A;  . Multiple bilateral arm/wrist surgeries after falls    . ORIF SHOULDER FRACTURE Left 06/2002   "shattered; put a bunch of metal plates in it"  . PATCH ANGIOPLASTY Right 07/07/2016   Procedure: PATCH ANGIOPLASTY USING 0.8CM X 7.6CM HEMASHIELD PATCH;  Surgeon: Rosetta Posner, MD;  Location: [place];  Service: Vascular;  Laterality: Right;  . SHOULDER ARTHROSCOPY W/ ROTATOR CUFF REPAIR Right 1999  . SHOULDER OPEN ROTATOR CUFF REPAIR Left 1992   Metal implant     Current Outpatient Medications  Medication Sig Dispense Refill  . amLODipine (NORVASC) 5 MG tablet Take 1 tablet (5 mg total) by mouth daily. 90 tablet 3  .  aspirin EC 81 MG tablet Take 81 mg by mouth at bedtime.     Marland Kitchen atorvastatin (LIPITOR) 10 MG tablet Take 1 tablet (10 mg total) by mouth daily. 30 tablet 11  . fluticasone (FLONASE) 50 MCG/ACT nasal spray Place 2 sprays into both nostrils daily as needed for allergies.     . hydrochlorothiazide (HYDRODIURIL) 12.5 MG tablet Take 12.5 mg by mouth daily.    . isosorbide dinitrate (ISORDIL) 20 MG tablet Take 1 tablet (20 mg total) by mouth 3 (three) times daily. 270 tablet 3  . levothyroxine (SYNTHROID, LEVOTHROID) 150 MCG tablet Take 175 mcg by mouth daily before breakfast.     . losartan (COZAAR) 50 MG tablet Take 1 tablet (50 mg total) by mouth daily. 90 tablet 3  . metoprolol succinate (TOPROL XL) 25 MG 24 hr tablet Take 1 tablet (25 mg total) by mouth daily. 90 tablet 0  . pantoprazole (PROTONIX) 40 MG tablet Take 40 mg by mouth daily.     No current facility-administered medications for this visit.    Allergies as of 03/01/2020 - Review Complete 03/01/2020  Allergen Reaction Noted  .  Lyrica [pregabalin] Shortness Of Breath 09/17/2012  . Celecoxib Other (See Comments)   . Codeine Itching 11/09/2014    Family History  Problem Relation Age of Onset  . COPD Mother   . Mental illness Mother   . Hypertension Mother   . Varicose Veins Mother   . Heart attack Mother 79  . Hypertension Father   . Alcohol abuse Father   . Colon cancer Neg Hx     Social History   Socioeconomic History  . Marital status: Married    Spouse name: [name]  . Number of children: 0  . Years of education: Ged  . Highest education level: Not on file  Occupational History    Comment: Disabled  Tobacco Use  . Smoking status: Former Smoker    Packs/day: 0.20    Years: 45.00    Pack years: 9.00    Types: Cigarettes    Quit date: 12/21/2017    Years since quitting: 2.1  . Smokeless tobacco: Never Used  Substance and Sexual Activity  . Alcohol use: Yes    Alcohol/week: 0.0 standard drinks    Comment: 1-2  beers in 6 months.   . Drug use: No  . Sexual activity: Not Currently  Other Topics Concern  . Not on file  Social History Narrative   Patient lives at home with his wife. [facility]).    Disabled.   Right handed.   Caffeine- two cups daily.   Three step children.   Social Determinants of Health   Financial Resource Strain:   . Difficulty of Paying Living Expenses:   Food Insecurity:   . Worried About Charity fundraiser in the Last Year:   . Arboriculturist in the Last Year:   Transportation Needs:   . Film/video editor (Medical):   Marland Kitchen Lack of Transportation (Non-Medical):   Physical Activity:   . Days of Exercise per Week:   . Minutes of Exercise per Session:   Stress:   . Feeling of Stress :   Social Connections:   . Frequency of Communication with Friends and Family:   . Frequency of Social Gatherings with Friends and Family:   . Attends Religious Services:   . Active Member of Clubs or Organizations:   . Attends Archivist Meetings:   Marland Kitchen Marital Status:     Review of Systems: Gen: See HPI CV: See HPI. Resp: See HPI GI: See HPI Heme: See HPI  Physical Exam: BP 135/74   Pulse 78   Temp (!) 97.1 F (36.2 C) (Oral)   Ht 6' (1.829 m)   Wt 224 lb 6.4 oz (101.8 kg)   BMI 30.43 kg/m  General:   Alert and oriented. No distress noted. Pleasant and cooperative.  Head:  Normocephalic and atraumatic. Eyes:  Conjuctiva clear without scleral icterus. Heart:  S1, S2 present without murmurs appreciated. Lungs:  Clear to auscultation bilaterally. No wheezes, rales, or rhonchi. No distress.  Abdomen:  +BS, soft, non-tender and non-distended. No rebound or guarding. No HSM or masses noted. Msk:  Symmetrical without gross deformities. Normal posture. Extremities:  Without edema. Neurologic:  Alert and  oriented x4 Psych:  Normal mood and affect.

## 2020-03-01 ENCOUNTER — Encounter: Payer: Self-pay | Admitting: Gastroenterology

## 2020-03-01 ENCOUNTER — Ambulatory Visit: Payer: Medicare Other | Admitting: Gastroenterology

## 2020-03-01 ENCOUNTER — Other Ambulatory Visit: Payer: Self-pay

## 2020-03-01 VITALS — BP 135/74 | HR 78 | Temp 97.1°F | Ht 72.0 in | Wt 224.4 lb

## 2020-03-01 DIAGNOSIS — K219 Gastro-esophageal reflux disease without esophagitis: Secondary | ICD-10-CM

## 2020-03-01 DIAGNOSIS — K921 Melena: Secondary | ICD-10-CM

## 2020-03-01 NOTE — Assessment & Plan Note (Addendum)
69 year old male with long history of intermittent toilet tissue hematochezia with known hemorrhoids.  Last colonoscopy due to hematochezia on 04/28/2019 revealed moderate, medium size grade 1 internal hemorrhoids, diverticulosis in the sigmoid and descending colon, otherwise normal.  He is due for repeat colonoscopy in 2025.  He continues to have intermittent toilet tissue hematochezia and reports PCP performed a stool test that revealed heme positive stool a couple weeks ago.  States his hemorrhoids were flared at that time but respond well to hemorrhoid cream from [place].  Denies constipation or rectal pain.  BMs daily that are soft.  Reports he does have to wipe quite a bit after a BM.  Discussed possibility of hemorrhoid banding.  Patient states he is satisfied with symptom control with hemorrhoid creams.  Continue using hemorrhoid cream from [place] as needed. Try Tucks pads or other wet wipes to help prevent irritation when wiping. Add Benefiber or Metamucil daily. Limit toilet time to 2-3 minutes. Advised he continue to monitor symptoms and let us know of any worsening. Follow-up in 6 months.  Call with questions or concerns prior.

## 2020-03-01 NOTE — Progress Notes (Signed)
CC'ED TO PCP 

## 2020-03-01 NOTE — Patient Instructions (Addendum)
Continue using you hemorrhoid cream as needed.   Try using tucks pads or other wet wipes to help with cleaning and to prevent irritation with wiping.   You may try adding benefiber or metamucil daily to help with stool consistency.   Be sure you are not sitting on the toilet for more than 2-3 minutes at a time.   Continue Protonix 40 mg daily 30 minutes before breakfast.   We will see you back in 6 months. Call with questions or concerns prior.   Aliene Altes, PA-C [facility]

## 2020-03-01 NOTE — Assessment & Plan Note (Signed)
GERD symptoms are well controlled on Protonix 40 mg daily.  No breakthrough symptoms.  No dysphagia or other alarm symptoms.    Continue Protonix 40 mg 30 minutes before breakfast. Follow-up in 6 months.

## 2020-03-03 DIAGNOSIS — Z23 Encounter for immunization: Secondary | ICD-10-CM | POA: Diagnosis not present

## 2020-06-07 DIAGNOSIS — I10 Essential (primary) hypertension: Secondary | ICD-10-CM | POA: Diagnosis not present

## 2020-06-07 DIAGNOSIS — I700 Atherosclerosis of aorta: Secondary | ICD-10-CM | POA: Diagnosis not present

## 2020-06-07 DIAGNOSIS — Z299 Encounter for prophylactic measures, unspecified: Secondary | ICD-10-CM | POA: Diagnosis not present

## 2020-06-07 DIAGNOSIS — I201 Angina pectoris with documented spasm: Secondary | ICD-10-CM | POA: Diagnosis not present

## 2020-07-07 DIAGNOSIS — Z789 Other specified health status: Secondary | ICD-10-CM | POA: Diagnosis not present

## 2020-07-07 DIAGNOSIS — R42 Dizziness and giddiness: Secondary | ICD-10-CM | POA: Diagnosis not present

## 2020-07-07 DIAGNOSIS — Z299 Encounter for prophylactic measures, unspecified: Secondary | ICD-10-CM | POA: Diagnosis not present

## 2020-07-07 DIAGNOSIS — I201 Angina pectoris with documented spasm: Secondary | ICD-10-CM | POA: Diagnosis not present

## 2020-07-07 DIAGNOSIS — I10 Essential (primary) hypertension: Secondary | ICD-10-CM | POA: Diagnosis not present

## 2020-07-09 DIAGNOSIS — I700 Atherosclerosis of aorta: Secondary | ICD-10-CM | POA: Diagnosis not present

## 2020-07-09 DIAGNOSIS — E039 Hypothyroidism, unspecified: Secondary | ICD-10-CM | POA: Diagnosis not present

## 2020-07-09 DIAGNOSIS — I10 Essential (primary) hypertension: Secondary | ICD-10-CM | POA: Diagnosis not present

## 2020-07-09 DIAGNOSIS — Z299 Encounter for prophylactic measures, unspecified: Secondary | ICD-10-CM | POA: Diagnosis not present

## 2020-07-09 DIAGNOSIS — I779 Disorder of arteries and arterioles, unspecified: Secondary | ICD-10-CM | POA: Diagnosis not present

## 2020-07-29 DIAGNOSIS — E785 Hyperlipidemia, unspecified: Secondary | ICD-10-CM | POA: Diagnosis not present

## 2020-07-29 DIAGNOSIS — I10 Essential (primary) hypertension: Secondary | ICD-10-CM | POA: Diagnosis not present

## 2020-07-29 DIAGNOSIS — I25118 Atherosclerotic heart disease of native coronary artery with other forms of angina pectoris: Secondary | ICD-10-CM | POA: Diagnosis not present

## 2020-07-29 DIAGNOSIS — I779 Disorder of arteries and arterioles, unspecified: Secondary | ICD-10-CM | POA: Diagnosis not present

## 2020-07-29 DIAGNOSIS — Z8679 Personal history of other diseases of the circulatory system: Secondary | ICD-10-CM | POA: Diagnosis not present

## 2020-09-02 NOTE — Progress Notes (Signed)
Referring Provider: Glenda Chroman, MD Primary Care Physician:  Glenda Chroman, MD Primary GI Physician: Dr. Gala Romney  Chief Complaint  Patient presents with  . Follow-up    diarrhea, bloating and pain on rt side    HPI:   Seth Savage is a 69 y.o. male presenting today for routine 27-month follow-up.  He has history of GERD, PUD secondary to H. pylori s/p documented eradication by biopsies in 2018, chronic RUQ abdominal pain predating cholecystectomy in June 2019 s/p EGD in September 2019 with subtle abnormalities of stomach and duodenum of doubtful clinical significance (pathology benign) and RUQ [place] with no significant findings.    Also with partial colectomy for intussusception in 2000, colon polyps, and rectal bleeding in the setting of hemorrhoids.   Last colonoscopy in June 2020 with moderate, medium size grade 1 internal hemorrhoids, diverticulosis in the sigmoid and descending colon, otherwise normal.  Due for repeat colonoscopy in 2025.   Last seen in our office 03/01/2020.  Reported positive stool test 1-2 weeks prior to office visit in the setting of hemorrhoid flare and low-volume toilet tissue hematochezia.  This resolved with hemorrhoid cream.  Bowels are moving well without constipation.  He was satisfied with using hemorrhoid cream and did not desire banding.  No unintentional weight loss.  GERD well controlled on Protonix.  No alarm symptoms.  Advised to continue using [facility] hemorrhoid cream as needed, monitor for worsening rectal bleeding and let us know, add daily fiber supplement, continue Protonix, and follow-up in 6 months.  Today:   Diarrhea: Bowels are [place] 6-7 about 70% of the time. Started a couple of years ago after cholecystectomy. Usually with 1 BM daily. Usually after drinking a cup of coffee in the morning. No associated abdominal cramping. Eats dairy fairly routine. Also fried foods frequently. Weight is stable.   GERD:  No trouble with Protonix  40 mg daily. No dysphagia.   Rectal Bleeding:  None recently. No black stools.   Chronic intermittent RUQ bloating/pain. States it is just annoying. Can be 3-4 times a week or nothing for a couple of weeks. Feels like it is related with increased gas. Notes this with milk consumption and deviled eggs. No change in symptoms over the years. May last a few hours.  May get better with passing gas/burping but doesn't really pay attention to this.  Fatty Liver: No signs or symptoms of decompensated liver disease.   Past Medical History:  Diagnosis Date  . Arthritis    "wrists, thumb on left hand" (03/12/2018)  . Back injury 2003   "broke my back in 2 places; never had any OR for it" (03/12/2018)  . Carotid artery occlusion   . Childhood asthma   . COPD (chronic obstructive pulmonary disease) ([place])   . Essential hypertension   . GERD (gastroesophageal reflux disease)   . Goiter    Radioactive iodine treatment  . H pylori ulcer Jan 2016   Treated with Prevpac  . History of bronchitis   . History of kidney stones   . Hypercholesterolemia   . Hypertension   . Hypothyroid   . Migraine    "been about 2 yr w/out a headache" (03/12/2018)  . Neuropathy   . Pneumonia 1969  . Shortness of breath dyspnea    with exertion  . Wears glasses     Past Surgical History:  Procedure Laterality Date  . BIOPSY  05/01/2017   Procedure: BIOPSY;  Surgeon: Daneil Dolin,  MD;  [facility];  Service: Endoscopy;;  gastric  . BIOPSY  08/19/2018   Procedure: BIOPSY;  Surgeon: Daneil Dolin, MD;  Location: [facility];  Service: Endoscopy;;  duodenal and gastric  . CARDIAC CATHETERIZATION N/A 07/05/2016   Procedure: Left Heart Cath and Coronary Angiography;  Surgeon: Burnell Blanks, MD;  Location: [place] [facility];  Service: Cardiovascular;  Laterality: N/A;  . CARDIAC CATHETERIZATION  03/12/2018  . CAROTID ENDARTERECTOMY Left ?2009  . CARPAL TUNNEL RELEASE Right   . CHOLECYSTECTOMY  N/A 04/24/2018   Procedure: LAPAROSCOPIC CHOLECYSTECTOMY;  Surgeon: Aviva Signs, MD;  Location: [facility];  Service: General;  Laterality: N/A;  . COLECTOMY  2000   Secondary to intussuception  . COLONOSCOPY  08/24/2004   Normal rectum,Small polyp at 25 cm, cold snared/ The remainder of the colonic mucosa appeared normal  . COLONOSCOPY  09/24/2012   Dr. Mike Craze hemorrhoids-likely source of hematochezia.Colonic diverticulosis  . COLONOSCOPY N/A 05/01/2017   Dr. Gala Romney: Diverticulosis, next colonoscopy in 5 years.  . COLONOSCOPY WITH PROPOFOL N/A 04/28/2019   Propofol; Dr. Gala Romney; moderate, medium sized grade 1 internal hemorrhoids, diverticulosis in sigmoid and descending colon, otherwise normal.  Repeat in 5 years.  Marland Kitchen ENDARTERECTOMY Right 07/07/2016   Procedure: ENDARTERECTOMY CAROTID;  Surgeon: Rosetta Posner, MD;  Location: [facility];  Service: Vascular;  Laterality: Right;  . ESOPHAGOGASTRODUODENOSCOPY N/A 12/09/2014   Dr. Gala Romney: Erosive reflux esophagitis. Peptic Ulcer disease secondary to H.pylori. small hiatal hernia. Nonbleeding duodenal AVM. Duodenal erosions. TREATED WITH PREVPAC  . ESOPHAGOGASTRODUODENOSCOPY N/A 03/15/2015   Dr. Gala Romney: small hiatal hernia, healed PUD, duodenal AVM.   Marland Kitchen ESOPHAGOGASTRODUODENOSCOPY N/A 05/01/2017   Dr. Gala Romney: LA grade a esophagitis, gastritis, no H pylori  . ESOPHAGOGASTRODUODENOSCOPY (EGD) WITH PROPOFOL N/A 08/19/2018   PROPOFOL;  Surgeon: Daneil Dolin, MD; normal esophagus, mild erythema and erosions in the entire stomach, small hiatal hernia, subtle white papular areas and duodenal bulb second and third portions of the duodenum.  . FRACTURE SURGERY    . LEFT HEART CATH AND CORONARY ANGIOGRAPHY N/A 03/12/2018   Procedure: LEFT HEART CATH AND CORONARY ANGIOGRAPHY;  Surgeon: Burnell Blanks, MD;  Location: [place] [facility];  Service: Cardiovascular;  Laterality: N/A;  . Multiple bilateral arm/wrist surgeries after falls    . ORIF SHOULDER FRACTURE  Left 06/2002   "shattered; put a bunch of metal plates in it"  . PATCH ANGIOPLASTY Right 07/07/2016   Procedure: PATCH ANGIOPLASTY USING 0.8CM X 7.6CM HEMASHIELD PATCH;  Surgeon: Rosetta Posner, MD;  Location: [place];  Service: Vascular;  Laterality: Right;  . SHOULDER ARTHROSCOPY W/ ROTATOR CUFF REPAIR Right 1999  . SHOULDER OPEN ROTATOR CUFF REPAIR Left 1992   Metal implant     Current Outpatient Medications  Medication Sig Dispense Refill  . amLODipine (NORVASC) 5 MG tablet Take 1 tablet (5 mg total) by mouth daily. 90 tablet 3  . aspirin EC 81 MG tablet Take 81 mg by mouth at bedtime.     Marland Kitchen atorvastatin (LIPITOR) 10 MG tablet Take 1 tablet (10 mg total) by mouth daily. 30 tablet 11  . buPROPion (ZYBAN) 150 MG 12 hr tablet Take 150 mg by mouth 2 (two) times daily.    . fluticasone (FLONASE) 50 MCG/ACT nasal spray Place 2 sprays into both nostrils daily as needed for allergies.     . hydrochlorothiazide (HYDRODIURIL) 12.5 MG tablet Take 12.5 mg by mouth daily.    . isosorbide dinitrate (ISORDIL)  20 MG tablet Take 1 tablet (20 mg total) by mouth 3 (three) times daily. 270 tablet 3  . levothyroxine (SYNTHROID, LEVOTHROID) 150 MCG tablet Take 175 mcg by mouth daily before breakfast.     . losartan (COZAAR) 50 MG tablet Take 1 tablet (50 mg total) by mouth daily. 90 tablet 3  . metoprolol succinate (TOPROL XL) 25 MG 24 hr tablet Take 1 tablet (25 mg total) by mouth daily. 90 tablet 0  . pantoprazole (PROTONIX) 40 MG tablet Take 40 mg by mouth daily.     No current facility-administered medications for this visit.    Allergies as of 09/03/2020 - Review Complete 09/03/2020  Allergen Reaction Noted  . Lyrica [pregabalin] Shortness Of Breath 09/17/2012  . Celecoxib Other (See Comments)   . Codeine Itching 11/09/2014    Family History  Problem Relation Age of Onset  . COPD Mother   . Mental illness Mother   . Hypertension Mother   . Varicose Veins Mother   . Heart attack Mother 7  .  Hypertension Father   . Alcohol abuse Father   . Colon cancer Neg Hx     Social History   Socioeconomic History  . Marital status: Married    Spouse name: [name]  . Number of children: 0  . Years of education: Ged  . Highest education level: Not on file  Occupational History    Comment: Disabled  Tobacco Use  . Smoking status: Former Smoker    Packs/day: 0.20    Years: 45.00    Pack years: 9.00    Types: Cigarettes    Quit date: 12/21/2017    Years since quitting: 2.7  . Smokeless tobacco: Never Used  Vaping Use  . Vaping Use: Former  Substance and Sexual Activity  . Alcohol use: Yes    Alcohol/week: 0.0 standard drinks    Comment: 1-2 beers in 6 months.   . Drug use: No  . Sexual activity: Not Currently  Other Topics Concern  . Not on file  Social History Narrative   Patient lives at home with his wife. [facility]).    Disabled.   Right handed.   Caffeine- two cups daily.   Three step children.   Social Determinants of Health   Financial Resource Strain:   . Difficulty of Paying Living Expenses: Not on file  Food Insecurity:   . Worried About Charity fundraiser in the Last Year: Not on file  . Ran Out of Food in the Last Year: Not on file  Transportation Needs:   . Lack of Transportation (Medical): Not on file  . Lack of Transportation (Non-Medical): Not on file  Physical Activity:   . Days of Exercise per Week: Not on file  . Minutes of Exercise per Session: Not on file  Stress:   . Feeling of Stress : Not on file  Social Connections:   . Frequency of Communication with Friends and Family: Not on file  . Frequency of Social Gatherings with Friends and Family: Not on file  . Attends Religious Services: Not on file  . Active Member of Clubs or Organizations: Not on file  . Attends Archivist Meetings: Not on file  . Marital Status: Not on file    Review of Systems: Gen: Denies fever, chills, cold or flulike symptoms.  Admits to lightheadedness  with position changes.  CV: Denies chest pain. Admits to intermittent palpitations but none in quite sometime.  Resp: SOB with exertion. No  SOB at rest. No routine cough.  GI: See HPI Heme: See HPI  Physical Exam: BP 135/70   Pulse 66   Temp (!) 96.9 F (36.1 C) (Temporal)   Ht 6' (1.829 m)   Wt 221 lb 12.8 oz (100.6 kg)   BMI 30.08 kg/m  General:   Alert and oriented. No distress noted. Pleasant and cooperative.  Head:  Normocephalic and atraumatic. Eyes:  Conjuctiva clear without scleral icterus. Heart:  S1, S2 present without murmurs appreciated. Lungs:  Clear to auscultation bilaterally. No wheezes, rales, or rhonchi. No distress.  Abdomen:  +BS, soft, non-tender and non-distended. No rebound or guarding. No HSM or masses noted. Msk:  Symmetrical without gross deformities. Normal posture. Extremities:  Without edema. Neurologic:  Alert and  oriented x4 Psych: Normal mood and affect.

## 2020-09-03 ENCOUNTER — Encounter: Payer: Self-pay | Admitting: Gastroenterology

## 2020-09-03 ENCOUNTER — Other Ambulatory Visit: Payer: Self-pay

## 2020-09-03 ENCOUNTER — Ambulatory Visit: Payer: Medicare Other | Admitting: Gastroenterology

## 2020-09-03 VITALS — BP 135/70 | HR 66 | Temp 96.9°F | Ht 72.0 in | Wt 221.8 lb

## 2020-09-03 DIAGNOSIS — R1011 Right upper quadrant pain: Secondary | ICD-10-CM

## 2020-09-03 DIAGNOSIS — R195 Other fecal abnormalities: Secondary | ICD-10-CM | POA: Diagnosis not present

## 2020-09-03 DIAGNOSIS — K219 Gastro-esophageal reflux disease without esophagitis: Secondary | ICD-10-CM | POA: Diagnosis not present

## 2020-09-03 DIAGNOSIS — K760 Fatty (change of) liver, not elsewhere classified: Secondary | ICD-10-CM | POA: Insufficient documentation

## 2020-09-03 NOTE — Assessment & Plan Note (Signed)
Well-controlled on Protonix 40 mg daily.  No alarm symptoms.  Advised to continue his current medications and follow-up in 6 months. 

## 2020-09-03 NOTE — Assessment & Plan Note (Signed)
Noted on prior imaging.  No signs or symptoms of decompensated liver disease.   Instructions for fatty liver: Recommend 1-2# weight loss per week until ideal body weight through exercise & diet. Low fat/cholesterol diet.   Avoid sweets, sodas, fruit juices, sweetened beverages like tea, etc. Gradually increase exercise from 15 min daily up to 1 hr per day 5 days/week. Limit alcohol use.

## 2020-09-03 NOTE — Assessment & Plan Note (Addendum)
Chronically loose stools since cholecystectomy in 2019.  Typically with 1 BM daily that is [place] 6-7.  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  Plan: Low-fat diet. Trial of lactose-free diet or take Lactaid tablets prior to dairy consumption to see if this improves symptoms. Discussed possible trial of Colestid.  Patient prefers to monitor for now symptoms are not very bothersome. Follow-up in 6 months.

## 2020-09-03 NOTE — Assessment & Plan Note (Addendum)
Several year history of intermittent RUQ abdominal pain predating cholecystectomy in June 2019.  He underwent EGD in September 2019 with subtle normalities of the stomach and duodenum of doubtful clinical significance (pathology benign) and RUQ ultrasound with no significant findings.  GERD is well controlled on daily PPI. Symptoms have been unchanged over the last several years and may occur 3-4 times a week or no symptoms for couple of weeks.  Upon further discussion, it seems this may be related to dietary choices/increased gas in the setting of milk consumption, deviled eggs, and frequent fried/fatty foods.  Abdominal exam is benign.  Plan:  Advised he follow a low fat diet. Avoid fired/fatty/greasy foods.  Trial lactose free diet or take lactaid tablets prior to dairy consumption.  Avoid items that cause gas/bloating including broccoli, cauliflower, cabbage, Brussels sprouts, beans, carbonated beverages, eating fast, and drinking through a straw. Handout provided.  Monitor for worsening symptoms.  Follow-up in 6 months.

## 2020-09-03 NOTE — Patient Instructions (Addendum)
GERD: Continue taking Protonix 40 mg daily 30 minutes before breakfast.  Loose stools: I suspect your loose stools are secondary to dietary choices.  Please avoid fried, fatty, greasy foods. I recommend you try following a lactose-free diet or taking Lactaid tablets prior to dairy consumption to see if this improves loose stools. Let me know of any worsening symptoms.   For chronic intermittent right upper quadrant pain/bloating: I suspect this may be secondary to diet as well. Follow low-fat diet. Avoid items that cause gas/bloating including broccoli, cauliflower, cabbage, Brussels sprouts, beans, carbonated beverages, eating fast, and drinking through a straw. See handout below.   Fatty Liver:  Instructions for fatty liver: Recommend 1-2# weight loss per week until ideal body weight through exercise & diet. Low fat/cholesterol diet.   Avoid sweets, sodas, fruit juices, sweetened beverages like tea, etc. Gradually increase exercise from 15 min daily up to 1 hr per day 5 days/week. Continue to limit alcohol use.  We will plan to see back in 6 months.  Do not hesitate to call if you have questions or concerns prior.  Aliene Altes, PA-C [facility]    Nonalcoholic Fatty Liver Disease Diet, Adult Nonalcoholic fatty liver disease is a condition that causes fat to build up in and around the liver. The disease makes it harder for the liver to work the way that it should. Following a healthy diet can help to keep nonalcoholic fatty liver disease under control. It can also help to prevent or improve conditions that are associated with the disease, such as heart disease, diabetes, high blood pressure, and abnormal cholesterol levels. Along with regular exercise, this diet:  Promotes weight loss.  Helps to control blood sugar levels.  Helps to improve the way that the body uses insulin. What are tips for following this plan? Reading food labels Always check food  labels for:  The amount of saturated fat in a food. You should limit your intake of saturated fat. Saturated fat is found in foods that come from animals, including meat and dairy products such as butter, cheese, and whole milk.  The amount of fiber in a food. You should choose high-fiber foods such as fruits, vegetables, and whole grains. Try to get 25-30 grams (g) of fiber a day.  Cooking  When cooking, use heart-healthy oils that are high in monounsaturated fats. These include olive oil, canola oil, and avocado oil.  Limit frying or deep-frying foods. Cook foods using healthy methods such as baking, boiling, steaming, and grilling instead. Meal planning  You may want to keep track of how many calories you take in. Eating the right amount of calories will help you achieve a healthy weight. Meeting with a registered dietitian can help you get started.  Limit how often you eat takeout and fast food. These foods are usually very high in fat, salt, and sugar.  Use the glycemic index (GI) to plan your meals. The index tells you how quickly a food will raise your blood sugar. Choose low-GI foods (GI less than 55). These foods take a longer time to raise blood sugar. A registered dietitian can help you identify foods lower on the GI scale. Lifestyle  You may want to follow a Mediterranean diet. This diet includes a lot of vegetables, lean meats or fish, whole grains, fruits, and healthy oils and fats. What foods can I eat?  Fruits Bananas. Apples. Oranges. Grapes. Papaya. Mango. Pomegranate. Kiwi. Grapefruit. Cherries. Vegetables Lettuce. Spinach. Peas. Beets. Cauliflower. Cabbage. Broccoli. Carrots.  Tomatoes. Squash. Eggplant. Herbs. Peppers. Onions. Cucumbers. Brussels sprouts. Yams and sweet potatoes. Beans. Lentils. Grains Whole wheat or whole-grain foods, including breads, crackers, cereals, and pasta. Stone-ground whole wheat. Unsweetened oatmeal. Bulgur. Barley. Quinoa. Brown or wild  rice. Corn or whole wheat flour tortillas. Meats and other proteins Lean meats. Poultry. Tofu. Seafood and shellfish. Dairy Low-fat or fat-free dairy products, such as yogurt, cottage cheese, or cheese. Beverages Water. Sugar-free drinks. Tea. Coffee. Low-fat or skim milk. Milk alternatives, such as soy or almond milk. Real fruit juice. Fats and oils Avocado. Canola or olive oil. Nuts and nut butters. Seeds. Seasonings and condiments Mustard. Relish. Low-fat, low-sugar ketchup and barbecue sauce. Low-fat or fat-free mayonnaise. Sweets and desserts Sugar-free sweets. The items listed above may not be a complete list of foods and beverages you can eat. Contact a dietitian for more information. What foods should I limit or avoid? Meats and other proteins Limit red meat to 1-2 times a week. Dairy [employer]. Fats and oils Palm oil and coconut oil. Fried foods. Other foods Processed foods. Foods that contain a lot of salt or sodium. Sweets and desserts Sweets that contain sugar. Beverages Sweetened drinks, such as sweet tea, milkshakes, iced sweet drinks, and sodas. Alcohol. The items listed above may not be a complete list of foods and beverages you should avoid. Contact a dietitian for more information. Where to find more information The [employer] of Diabetes and Digestive and Kidney Diseases: [employer] Summary  Nonalcoholic fatty liver disease is a condition that causes fat to build up in and around the liver.  Following a healthy diet can help to keep nonalcoholic fatty liver disease under control. Your diet should be rich in fruits, vegetables, whole grains, and lean proteins.  Limit your intake of saturated fat. Saturated fat is found in foods that come from animals, including meat and dairy products such as butter, cheese, and whole milk.  This diet promotes weight loss, helps to control blood sugar levels, and helps to improve the way that the body uses  insulin. This information is not intended to replace advice given to you by your health care provider. Make sure you discuss any questions you have with your health care provider. Document Revised: 02/28/2019 Document Reviewed: 11/28/2018 Elsevier Patient Education  [place].  Abdominal Bloating When you have abdominal bloating, your abdomen may feel full, tight, or painful. It may also look bigger than normal or swollen (distended). Common causes of abdominal bloating include:  Swallowing air.  Constipation.  Problems digesting food.  Eating too much.  Irritable bowel syndrome. This is a condition that affects the large intestine.  Lactose intolerance. This is an inability to digest lactose, a natural sugar in dairy products.  Celiac disease. This is a condition that affects the ability to digest gluten, a protein found in some grains.  Gastroparesis. This is a condition that slows down the movement of food in the stomach and small intestine. It is more common in people with diabetes mellitus.  Gastroesophageal reflux disease (GERD). This is a digestive condition that makes stomach acid flow back into the esophagus.  Urinary retention. This means that the body is holding onto urine, and the bladder cannot be emptied all the way. Follow these instructions at home: Eating and drinking  Avoid eating too much.  Try not to swallow air while talking or eating.  Avoid eating while lying down.  Avoid these foods and drinks: ? Foods that cause gas, such as broccoli,  cabbage, cauliflower, and baked beans. ? Carbonated drinks. ? Hard candy. ? Chewing gum. Medicines  Take over-the-counter and prescription medicines only as told by your health care provider.  Take probiotic medicines. These medicines contain live bacteria or yeasts that can help digestion.  Take coated peppermint oil capsules. Activity  Try to exercise regularly. Exercise may help to relieve bloating  that is caused by gas and relieve constipation. General instructions  Keep all follow-up visits as told by your health care provider. This is important. Contact a health care provider if:  You have nausea and vomiting.  You have diarrhea.  You have abdominal pain.  You have unusual weight loss or weight gain.  You have severe pain, and medicines do not help. Get help right away if:  You have severe chest pain.  You have trouble breathing.  You have shortness of breath.  You have trouble urinating.  You have darker urine than normal.  You have blood in your stools or have dark, tarry stools. Summary  Abdominal bloating means that the abdomen is swollen.  Common causes of abdominal bloating are swallowing air, constipation, and problems digesting food.  Avoid eating too much and avoid swallowing air.  Avoid foods that cause gas, carbonated drinks, hard candy, and chewing gum. This information is not intended to replace advice given to you by your health care provider. Make sure you discuss any questions you have with your health care provider. Document Revised: 02/24/2019 Document Reviewed: 12/08/2016 Elsevier Patient Education  [place].

## 2020-09-06 DIAGNOSIS — I10 Essential (primary) hypertension: Secondary | ICD-10-CM | POA: Diagnosis not present

## 2020-09-06 DIAGNOSIS — Z299 Encounter for prophylactic measures, unspecified: Secondary | ICD-10-CM | POA: Diagnosis not present

## 2020-09-06 DIAGNOSIS — Z23 Encounter for immunization: Secondary | ICD-10-CM | POA: Diagnosis not present

## 2020-09-06 DIAGNOSIS — I472 Ventricular tachycardia: Secondary | ICD-10-CM | POA: Diagnosis not present

## 2020-09-06 DIAGNOSIS — I700 Atherosclerosis of aorta: Secondary | ICD-10-CM | POA: Diagnosis not present

## 2020-10-03 ENCOUNTER — Telehealth: Payer: Self-pay | Admitting: Gastroenterology

## 2020-10-03 NOTE — Telephone Encounter (Signed)
Received labs from PCP completed 07/09/2020.  We requested these just to have on file.  TSH: 1.42 T3: 110 CMP: Glucose 103, BUN 20, creatinine 1.24, sodium 139, potassium 4.7, chloride 99, calcium 9.6, albumin 4.7, total bilirubin 0.3, alk phos 123 (H), AST 15, ALT 19 CBC: WBC 8.9, hemoglobin 15.4, hematocrit 44.5, MCV 90, MCH 31.0, MCHC 34.6, platelets 919  Alicia, please let patient know we received lab results he had completed back in August 2021 with PCP. I wanted to mention to him that one of his liver enzymes is slightly elevated. This isn't significant at this point. We will discuss this at his next visit and plan to repeat labs. He should continue with prior recommendations for fatty liver we had discussed at his OV.

## 2020-10-04 NOTE — Telephone Encounter (Signed)
Spoke with pt. Pt was notified of lab results were received. Pt is aware that his liver enzymes is slightly elevated and not significant at this point. We will discuss this at his next visit and plan to repeat labs. Pt will continue with prior recommendations for fatty liver we had discussed at his OV.

## 2020-11-15 NOTE — Progress Notes (Signed)
Cardiology Office Note  Date: 11/16/2020   ID: Seth Savage, DOB November 01, 1951, MRN [id number]  PCP:  Glenda Chroman, MD  Cardiologist:  Kate Sable, MD (Inactive) Electrophysiologist:  None   Chief Complaint: Cardiac follow up  History of Present Illness: Seth Savage is a 69 y.o. male with a history of CAD, stable angina, history of Prinzmetal angina, essential hypertension, hyperlipidemia, PAD, smoking, carotid artery disease, HTN, GERD, dyspnea on exertion, obesity  Had recently seen Dr. Candis Musa at [facility] cardiology.  On 07/29/2020.  He reported Prinzmetal's angina.  He was continuing amlodipine 5 mg daily.  He was having trouble remembering to take Isordil 3 times daily.  He planning to switch to Imdur 30 mg daily.  However this was not done.  Blood pressure control appeared acceptable.  Dr. Candis Musa started a trial of Crestor.  Status post bilateral CEA 2001 on the left side and 2004 on the right.  He was continuing aspirin and statin.  No neurological symptoms.  He presents today for 1 year follow-up he denies any recent acute illnesses or hospitalizations.  He states he has some arthritic pain but otherwise no issues.  States his blood pressure at home systolic usually runs in the 140s.  He denies any anginal or exertional symptoms, palpitations or arrhythmias, orthostatic symptoms other than occasional transient dizziness when stooping over and attempting to stand erect again.  Denies CVA or TIA symptoms, PND, orthopnea, bleeding.  Denies any claudication-like symptoms, DVT or PE-like symptoms, or lower extremity edema.  States he has some arthritis in his hips and legs.  He does have some chronic dyspnea secondary to COPD due to history of smoking.   Past Medical History:  Diagnosis Date  . Arthritis    "wrists, thumb on left hand" (03/12/2018)  . Back injury 2003   "broke my back in 2 places; never had any OR for it" (03/12/2018)  . Carotid artery occlusion   .  Childhood asthma   . COPD (chronic obstructive pulmonary disease) ([place])   . Essential hypertension   . GERD (gastroesophageal reflux disease)   . Goiter    Radioactive iodine treatment  . H pylori ulcer Jan 2016   Treated with Prevpac  . History of bronchitis   . History of kidney stones   . Hypercholesterolemia   . Hypertension   . Hypothyroid   . Migraine    "been about 2 yr w/out a headache" (03/12/2018)  . Neuropathy   . Pneumonia 1969  . Shortness of breath dyspnea    with exertion  . Wears glasses     Past Surgical History:  Procedure Laterality Date  . BIOPSY  05/01/2017   Procedure: BIOPSY;  Surgeon: Daneil Dolin, MD;  Location: [facility];  Service: Endoscopy;;  gastric  . BIOPSY  08/19/2018   Procedure: BIOPSY;  Surgeon: Daneil Dolin, MD;  Location: [facility];  Service: Endoscopy;;  duodenal and gastric  . CARDIAC CATHETERIZATION N/A 07/05/2016   Procedure: Left Heart Cath and Coronary Angiography;  Surgeon: Burnell Blanks, MD;  Location: [facility];  Service: Cardiovascular;  Laterality: N/A;  . CARDIAC CATHETERIZATION  03/12/2018  . CAROTID ENDARTERECTOMY Left ?2009  . CARPAL TUNNEL RELEASE Right   . CHOLECYSTECTOMY N/A 04/24/2018   Procedure: LAPAROSCOPIC CHOLECYSTECTOMY;  Surgeon: Aviva Signs, MD;  Location: [facility];  Service: General;  Laterality: N/A;  . COLECTOMY  2000   Secondary to intussuception  . COLONOSCOPY  08/24/2004   Normal rectum,Small polyp at 25 cm, cold snared/ The remainder of the colonic mucosa appeared normal  . COLONOSCOPY  09/24/2012   Dr. Mike Craze hemorrhoids-likely source of hematochezia.Colonic diverticulosis  . COLONOSCOPY N/A 05/01/2017   Dr. Gala Romney: Diverticulosis, next colonoscopy in 5 years.  . COLONOSCOPY WITH PROPOFOL N/A 04/28/2019   Propofol; Dr. Gala Romney; moderate, medium sized grade 1 internal hemorrhoids, diverticulosis in sigmoid and descending colon, otherwise normal.  Repeat in 5 years.  Marland Kitchen  ENDARTERECTOMY Right 07/07/2016   Procedure: ENDARTERECTOMY CAROTID;  Surgeon: Rosetta Posner, MD;  Location: [facility] OR;  Service: Vascular;  Laterality: Right;  . ESOPHAGOGASTRODUODENOSCOPY N/A 12/09/2014   Dr. Gala Romney: Erosive reflux esophagitis. Peptic Ulcer disease secondary to H.pylori. small hiatal hernia. Nonbleeding duodenal AVM. Duodenal erosions. TREATED WITH PREVPAC  . ESOPHAGOGASTRODUODENOSCOPY N/A 03/15/2015   Dr. Gala Romney: small hiatal hernia, healed PUD, duodenal AVM.   Marland Kitchen ESOPHAGOGASTRODUODENOSCOPY N/A 05/01/2017   Dr. Gala Romney: LA grade a esophagitis, gastritis, no H pylori  . ESOPHAGOGASTRODUODENOSCOPY (EGD) WITH PROPOFOL N/A 08/19/2018   PROPOFOL;  Surgeon: Daneil Dolin, MD; normal esophagus, mild erythema and erosions in the entire stomach, small hiatal hernia, subtle white papular areas and duodenal bulb second and third portions of the duodenum.  . FRACTURE SURGERY    . LEFT HEART CATH AND CORONARY ANGIOGRAPHY N/A 03/12/2018   Procedure: LEFT HEART CATH AND CORONARY ANGIOGRAPHY;  Surgeon: Burnell Blanks, MD;  Location: [place];  Service: Cardiovascular;  Laterality: N/A;  . Multiple bilateral arm/wrist surgeries after falls    . ORIF SHOULDER FRACTURE Left 06/2002   "shattered; put a bunch of metal plates in it"  . PATCH ANGIOPLASTY Right 07/07/2016   Procedure: PATCH ANGIOPLASTY USING 0.8CM X 7.6CM HEMASHIELD PATCH;  Surgeon: Rosetta Posner, MD;  Location: [facility];  Service: Vascular;  Laterality: Right;  . SHOULDER ARTHROSCOPY W/ ROTATOR CUFF REPAIR Right 1999  . SHOULDER OPEN ROTATOR CUFF REPAIR Left 1992   Metal implant     Current Outpatient Medications  Medication Sig Dispense Refill  . amLODipine (NORVASC) 5 MG tablet Take 1 tablet (5 mg total) by mouth daily. 90 tablet 3  . aspirin EC 81 MG tablet Take 81 mg by mouth at bedtime.     Marland Kitchen atorvastatin (LIPITOR) 10 MG tablet Take 1 tablet (10 mg total) by mouth daily. 30 tablet 11  . buPROPion (ZYBAN) 150 MG 12 hr  tablet Take 150 mg by mouth 2 (two) times daily.    . fluticasone (FLONASE) 50 MCG/ACT nasal spray Place 2 sprays into both nostrils daily as needed for allergies.     . hydrochlorothiazide (HYDRODIURIL) 12.5 MG tablet Take 12.5 mg by mouth daily.    . isosorbide dinitrate (ISORDIL) 20 MG tablet Take 1 tablet (20 mg total) by mouth 3 (three) times daily. 270 tablet 3  . levothyroxine (SYNTHROID, LEVOTHROID) 150 MCG tablet Take 175 mcg by mouth daily before breakfast.     . losartan (COZAAR) 50 MG tablet Take 1 tablet (50 mg total) by mouth daily. 90 tablet 3  . metoprolol succinate (TOPROL XL) 25 MG 24 hr tablet Take 1 tablet (25 mg total) by mouth daily. 90 tablet 0  . pantoprazole (PROTONIX) 40 MG tablet Take 40 mg by mouth daily.    Marland Kitchen buPROPion (WELLBUTRIN SR) 150 MG 12 hr tablet Take by mouth.     No current facility-administered medications for this visit.   Allergies:  Lyrica [pregabalin], Celecoxib, and Codeine  Social History: The patient  reports that he quit smoking about 2 years ago. His smoking use included cigarettes. He has a 9.00 pack-year smoking history. He has never used smokeless tobacco. He reports current alcohol use. He reports that he does not use drugs.   Family History: The patient's family history includes Alcohol abuse in his father; COPD in his mother; Heart attack (age of onset: 72) in his mother; Hypertension in his father and mother; Mental illness in his mother; Varicose Veins in his mother.   ROS:  Please see the history of present illness. Otherwise, complete review of systems is positive for none.  All other systems are reviewed and negative.   Physical Exam: VS:  BP (!) 150/80   Pulse 67   Ht 6' (1.829 m)   Wt 220 lb (99.8 kg)   SpO2 96%   BMI 29.84 kg/m , BMI Body mass index is 29.84 kg/m.  Wt Readings from Last 3 Encounters:  11/16/20 220 lb (99.8 kg)  09/03/20 221 lb 12.8 oz (100.6 kg)  03/01/20 224 lb 6.4 oz (101.8 kg)    General: Patient  appears comfortable at rest. Neck: Supple, no elevated JVP or carotid bruits, no thyromegaly. Lungs: Clear to auscultation, nonlabored breathing at rest. Cardiac: Regular rate and rhythm, no S3 or significant systolic murmur, no pericardial rub. Extremities: No pitting edema, distal pulses 2+. Skin: Warm and dry. Musculoskeletal: No kyphosis. Neuropsychiatric: Alert and oriented x3, affect grossly appropriate.  ECG:  An ECG dated 11/16/2020 was personally reviewed today and demonstrated:  Normal sinus rhythm rate of 67, left bundle branch block.  Recent Labwork: No results found for requested labs within last 8760 hours.     Component Value Date/Time   CHOL 119 03/13/2018 0256   TRIG 213 (H) 03/13/2018 0256   HDL 29 (L) 03/13/2018 0256   CHOLHDL 4.1 03/13/2018 0256   VLDL 43 (H) 03/13/2018 0256   LDLCALC 47 03/13/2018 0256    Other Studies Reviewed Today:  Echocardiogram: 06/2018 Study Conclusions - Left ventricle: The cavity size was normal. Wall thickness was normal. Systolic function was normal. The estimated ejection fraction was in the range of 55% to 60%. Wall motion was normal; there were no regional wall motion abnormalities. Doppler parameters are consistent with abnormal left ventricular relaxation (grade 1 diastolic dysfunction). Doppler parameters are consistent with indeterminate ventricular filling pressure. - Aortic valve: Mildly calcified annulus. Trileaflet. - Mitral valve: There was mild regurgitation.  Coronary CT: None  Stress test: None since heart cath  Cardiac Catheterization: 02/2018  Prox RCA lesion is 30% stenosed.  Post Atrio lesion is 40% stenosed.  Ost RPDA lesion is 20% stenosed.  Ost 1st Mrg lesion is 30% stenosed.  Ost 2nd Mrg lesion is 30% stenosed.  Ost Cx to Prox Cx lesion is 20% stenosed.  Ost LAD to Prox LAD lesion is 30% stenosed.  The left ventricular systolic function is normal.  LV end diastolic pressure is  normal.  The left ventricular ejection fraction is 55-65% by visual estimate.  There is no mitral valve regurgitation.   Event Monitor: 05/2018  Sinus rhythm and sinus bradycardia with rare PVCs. No significant arrhythmias.   Assessment and Plan:  1. Prinzmetal's angina ([place])   2. Essential hypertension   3. Mixed hyperlipidemia   4. Bilateral carotid artery stenosis    1. Prinzmetal's angina [facility]) Denies any recent anginal symptoms.  He recently saw cardiologist at [facility] in September.  Cardiologist was planning to switch  him from Isordil to isosorbide mononitrate for better compliance.  Patient states this was never done.  Please stop Isordil and start Imdur 30 mg daily.  Continue aspirin 81 mg daily.  Continue Toprol-XL 25 mg p.o. daily.  2. Essential hypertension Blood pressure elevated on arrival today at 150/80.  Patient states his blood pressures at home usually run in the 0000000 systolic.  Increase amlodipine to 10 mg daily.  Continue hydrochlorothiazide 12.5 mg p.o. daily.  Continue losartan 50 mg p.o. daily.  Continue Toprol-XL 25 mg p.o. daily.  Advised to start checking blood pressure daily.  Goal is 130/80 or below.  Advised if sustained blood pressures at or above 130/80 to call us.  We may need to adjust antihypertensive medications.  3. Mixed hyperlipidemia Continue atorvastatin 10 mg p.o. daily.  4. Bilateral carotid artery stenosis History of bilateral cysts carotid stenosis and bilateral CEAs.  Denies any neurologic symptoms.  Medication Adjustments/Labs and Tests Ordered: Current medicines are reviewed at length with the patient today.  Concerns regarding medicines are outlined above.   Disposition: Follow-up with Dr. Domenic Polite or APP 1 year  Signed, Levell July, NP 11/16/2020 2:08 PM    [facility] at [place], [place] [address], [place] [address] Phone: [phone]; Fax: [phone]

## 2020-11-16 ENCOUNTER — Ambulatory Visit: Payer: Medicare Other | Admitting: Family Medicine

## 2020-11-16 ENCOUNTER — Encounter: Payer: Self-pay | Admitting: Family Medicine

## 2020-11-16 ENCOUNTER — Other Ambulatory Visit: Payer: Self-pay

## 2020-11-16 VITALS — BP 150/80 | HR 67 | Ht 72.0 in | Wt 220.0 lb

## 2020-11-16 DIAGNOSIS — I6523 Occlusion and stenosis of bilateral carotid arteries: Secondary | ICD-10-CM | POA: Diagnosis not present

## 2020-11-16 DIAGNOSIS — E782 Mixed hyperlipidemia: Secondary | ICD-10-CM | POA: Diagnosis not present

## 2020-11-16 DIAGNOSIS — I201 Angina pectoris with documented spasm: Secondary | ICD-10-CM | POA: Diagnosis not present

## 2020-11-16 DIAGNOSIS — I10 Essential (primary) hypertension: Secondary | ICD-10-CM | POA: Diagnosis not present

## 2020-11-16 MED ORDER — ISOSORBIDE MONONITRATE ER 30 MG PO TB24: 30.0000 mg | ORAL_TABLET | Freq: Every day | ORAL | 3 refills | Status: DC

## 2020-11-16 MED ORDER — AMLODIPINE BESYLATE 10 MG PO TABS: 10.0000 mg | ORAL_TABLET | Freq: Every day | ORAL | 3 refills | Status: DC

## 2020-11-16 NOTE — Patient Instructions (Signed)
Medication Instructions:  Your physician has recommended you make the following change in your medication:   Stop taking Isordil  Start taking Imdur 30 mg Daily  Increase Norvasc to 10 mg daily   *If you need a refill on your cardiac medications before your next appointment, please call your pharmacy*   Lab Work: NONE   If you have labs (blood work) drawn today and your tests are completely normal, you will receive your results only by: Marland Kitchen MyChart Message (if you have MyChart) OR . A paper copy in the mail If you have any lab test that is abnormal or we need to change your treatment, we will call you to review the results.   Testing/Procedures: NONE     Follow-Up: At [facility], you and your health needs are our priority.  As part of our continuing mission to provide you with exceptional heart care, we have created designated Provider Care Teams.  These Care Teams include your primary Cardiologist (physician) and Advanced Practice Providers (APPs -  Physician Assistants and Nurse Practitioners) who all work together to provide you with the care you need, when you need it.  We recommend signing up for the patient portal called "MyChart".  Sign up information is provided on this After Visit Summary.  MyChart is used to connect with patients for Virtual Visits (Telemedicine).  Patients are able to view lab/test results, encounter notes, upcoming appointments, etc.  Non-urgent messages can be sent to your provider as well.   To learn more about what you can do with MyChart, go to [email or web address].    Your next appointment:   1 year(s)  The format for your next appointment:   In Person  Provider:   You may see With MD  or the following Advanced Practice Provider on your designated Care Team:    Nena Polio, NP    Other Instructions Thank you for choosing [facility]!

## 2020-11-29 ENCOUNTER — Encounter: Payer: Self-pay | Admitting: Internal Medicine

## 2021-01-22 ENCOUNTER — Emergency Department (HOSPITAL_COMMUNITY): Payer: Medicare Other

## 2021-01-22 ENCOUNTER — Emergency Department (HOSPITAL_COMMUNITY)
Admission: EM | Admit: 2021-01-22 | Discharge: 2021-01-22 | Disposition: A | Discharge status: Home / Self Care | Payer: Medicare Other | Attending: Emergency Medicine | Admitting: Emergency Medicine

## 2021-01-22 ENCOUNTER — Other Ambulatory Visit: Payer: Self-pay

## 2021-01-22 ENCOUNTER — Encounter (HOSPITAL_COMMUNITY): Payer: Self-pay | Admitting: *Deleted

## 2021-01-22 DIAGNOSIS — E039 Hypothyroidism, unspecified: Secondary | ICD-10-CM | POA: Diagnosis not present

## 2021-01-22 DIAGNOSIS — J45909 Unspecified asthma, uncomplicated: Secondary | ICD-10-CM | POA: Insufficient documentation

## 2021-01-22 DIAGNOSIS — Z87891 Personal history of nicotine dependence: Secondary | ICD-10-CM | POA: Diagnosis not present

## 2021-01-22 DIAGNOSIS — R079 Chest pain, unspecified: Secondary | ICD-10-CM | POA: Diagnosis not present

## 2021-01-22 DIAGNOSIS — J449 Chronic obstructive pulmonary disease, unspecified: Secondary | ICD-10-CM | POA: Insufficient documentation

## 2021-01-22 DIAGNOSIS — Z79899 Other long term (current) drug therapy: Secondary | ICD-10-CM | POA: Insufficient documentation

## 2021-01-22 DIAGNOSIS — R0789 Other chest pain: Secondary | ICD-10-CM | POA: Diagnosis not present

## 2021-01-22 DIAGNOSIS — Z7982 Long term (current) use of aspirin: Secondary | ICD-10-CM | POA: Insufficient documentation

## 2021-01-22 DIAGNOSIS — I2510 Atherosclerotic heart disease of native coronary artery without angina pectoris: Secondary | ICD-10-CM | POA: Diagnosis not present

## 2021-01-22 DIAGNOSIS — I10 Essential (primary) hypertension: Secondary | ICD-10-CM | POA: Insufficient documentation

## 2021-01-22 LAB — COMPREHENSIVE METABOLIC PANEL
ALT: 24 U/L (ref 0–44)
AST: 16 U/L (ref 15–41)
Albumin: 4 g/dL (ref 3.5–5.0)
Alkaline Phosphatase: 87 U/L (ref 38–126)
Anion gap: 10 (ref 5–15)
BUN: 23 mg/dL (ref 8–23)
CO2: 24 mmol/L (ref 22–32)
Calcium: 8.6 mg/dL — ABNORMAL LOW (ref 8.9–10.3)
Chloride: 104 mmol/L (ref 98–111)
Creatinine, Ser: 1.26 mg/dL — ABNORMAL HIGH (ref 0.61–1.24)
GFR, Estimated: >60 mL/min (ref >60)
Glucose, Bld: 148 mg/dL — ABNORMAL HIGH (ref 70–99)
Potassium: 4 mmol/L (ref 3.5–5.1)
Sodium: 138 mmol/L (ref 135–145)
Total Bilirubin: 0.5 mg/dL (ref 0.3–1.2)
Total Protein: 6.8 g/dL (ref 6.5–8.1)

## 2021-01-22 LAB — CBC WITH DIFFERENTIAL/PLATELET
Abs Immature Granulocytes: 0.03 10*3/uL (ref 0.00–0.07)
Basophils Absolute: 0 10*3/uL (ref 0.0–0.1)
Basophils Relative: 1 %
Eosinophils Absolute: 0.1 10*3/uL (ref 0.0–0.5)
Eosinophils Relative: 2 %
HCT: 42 % (ref 39.0–52.0)
Hemoglobin: 14.1 g/dL (ref 13.0–17.0)
Immature Granulocytes: 1 %
Lymphocytes Relative: 17 %
Lymphs Abs: 1 10*3/uL (ref 0.7–4.0)
MCH: 30.3 pg (ref 26.0–34.0)
MCHC: 33.6 g/dL (ref 30.0–36.0)
MCV: 90.3 fL (ref 80.0–100.0)
Monocytes Absolute: 0.5 10*3/uL (ref 0.1–1.0)
Monocytes Relative: 9 %
Neutro Abs: 4.2 10*3/uL (ref 1.7–7.7)
Neutrophils Relative %: 70 %
Platelets: 214 10*3/uL (ref 150–400)
RBC: 4.65 MIL/uL (ref 4.22–5.81)
RDW: 13.2 % (ref 11.5–15.5)
WBC: 5.9 10*3/uL (ref 4.0–10.5)
nRBC: 0 % (ref 0.0–0.2)

## 2021-01-22 LAB — TROPONIN I (HIGH SENSITIVITY)
Troponin I (High Sensitivity): 5 ng/L (ref <18)
Troponin I (High Sensitivity): 5 ng/L (ref <18)

## 2021-01-22 NOTE — ED Triage Notes (Signed)
Chest pain onset yesterday, took ntg without relief

## 2021-01-22 NOTE — ED Provider Notes (Signed)
[facility] EMERGENCY DEPARTMENT Provider Note   CSN: [id number] Arrival date & time: 01/22/21  1704     History Chief Complaint  Patient presents with  . Chest Pain    Seth Savage is a 70 y.o. male.  Patient with known history of Prinzmetal angina.  He had onset of chest pain starting yesterday about 2 PM.  This pain is very intermittent but happens like every 2 minutes.  This is shooting pain left anterior chest.  Not associated with anything else.  He is on long-term nitrates.  He said he took some of his wife's nitroglycerin which gave some relief for about 5 minutes but did not hold.  No shortness of breath no abdominal pain no syncope patient's pain is not made worse by movement of any of his arms.  The pain is not radiating.  Patient is followed by cardiology here in [place].        Past Medical History:  Diagnosis Date  . Arthritis    "wrists, thumb on left hand" (03/12/2018)  . Back injury 2003   "broke my back in 2 places; never had any OR for it" (03/12/2018)  . Carotid artery occlusion   . Childhood asthma   . COPD (chronic obstructive pulmonary disease) ([place])   . Essential hypertension   . GERD (gastroesophageal reflux disease)   . Goiter    Radioactive iodine treatment  . H pylori ulcer Jan 2016   Treated with Prevpac  . History of bronchitis   . History of kidney stones   . Hypercholesterolemia   . Hypertension   . Hypothyroid   . Migraine    "been about 2 yr w/out a headache" (03/12/2018)  . Neuropathy   . Pneumonia 1969  . Shortness of breath dyspnea    with exertion  . Wears glasses     Patient Active Problem List   Diagnosis Date Noted  . Fatty liver 09/03/2020  . Loose stools 09/03/2020  . Heme positive stool 05/13/2018  . Abdominal pain, epigastric 05/13/2018  . Acute URI 05/02/2018  . Adult body mass index 28.0-28.9 05/02/2018  . Allergic rhinitis 05/02/2018  . Elevated PSA 05/02/2018  . Screening for prostate cancer 05/02/2018   . Fatigue 05/02/2018  . Former smoker 05/02/2018  . Gastroesophageal reflux disease 05/02/2018  . History of hay fever 05/02/2018  . History of migraine headaches 05/02/2018  . History of hypothyroidism 05/02/2018  . History of renal calculi 05/02/2018  . Hypercholesterolemia 05/02/2018  . Hypothyroid 05/02/2018  . Kidney stone 05/02/2018  . Migraine 05/02/2018  . Screening for depression 05/02/2018  . Biliary colic 63/11/6008  . Ventricular tachyarrhythmia ([place]) 04/24/2018  . Gallbladder sludge   . VT (ventricular tachycardia) ([place])   . Coronary artery disease due to lipid rich plaque   . S/P laparoscopic cholecystectomy   . Unstable angina ([place]) 03/12/2018  . History of colonic polyps 03/23/2017  . RUQ pain 02/07/2017  . Asymptomatic carotid artery stenosis 07/07/2016  . Abnormal stress test   . Coronary artery disease involving native coronary artery of native heart without angina pectoris   . Tobacco use disorder 03/23/2015  . Essential hypertension 03/23/2015  . Chest pain 03/22/2015  . History of peptic ulcer disease   . PUD (peptic ulcer disease) 02/25/2015  . Acute gastric ulcer   . Reflux esophagitis   . Headache(784.0) 09/03/2013  . Occlusion and stenosis of carotid artery without mention of cerebral infarction 12/12/2012  . LUQ pain 09/23/2012  .  Hematochezia 09/23/2012    Past Surgical History:  Procedure Laterality Date  . BIOPSY  05/01/2017   Procedure: BIOPSY;  Surgeon: Daneil Dolin, MD;  Location: [facility];  Service: Endoscopy;;  gastric  . BIOPSY  08/19/2018   Procedure: BIOPSY;  Surgeon: Daneil Dolin, MD;  Location: [facility];  Service: Endoscopy;;  duodenal and gastric  . CARDIAC CATHETERIZATION N/A 07/05/2016   Procedure: Left Heart Cath and Coronary Angiography;  Surgeon: Burnell Blanks, MD;  Location: [facility];  Service: Cardiovascular;  Laterality: N/A;  . CARDIAC CATHETERIZATION  03/12/2018  . CAROTID ENDARTERECTOMY  Left ?2009  . CARPAL TUNNEL RELEASE Right   . CHOLECYSTECTOMY N/A 04/24/2018   Procedure: LAPAROSCOPIC CHOLECYSTECTOMY;  Surgeon: Aviva Signs, MD;  Location: [facility] ORS;  Service: General;  Laterality: N/A;  . COLECTOMY  2000   Secondary to intussuception  . COLONOSCOPY  08/24/2004   Normal rectum,Small polyp at 25 cm, cold snared/ The remainder of the colonic mucosa appeared normal  . COLONOSCOPY  09/24/2012   Dr. Mike Craze hemorrhoids-likely source of hematochezia.Colonic diverticulosis  . COLONOSCOPY N/A 05/01/2017   Dr. Gala Romney: Diverticulosis, next colonoscopy in 5 years.  . COLONOSCOPY WITH PROPOFOL N/A 04/28/2019   Propofol; Dr. Gala Romney; moderate, medium sized grade 1 internal hemorrhoids, diverticulosis in sigmoid and descending colon, otherwise normal.  Repeat in 5 years.  Marland Kitchen ENDARTERECTOMY Right 07/07/2016   Procedure: ENDARTERECTOMY CAROTID;  Surgeon: Rosetta Posner, MD;  Location[facility];  Service: Vascular;  Laterality: Right;  . ESOPHAGOGASTRODUODENOSCOPY N/A 12/09/2014   Dr. Gala Romney: Erosive reflux esophagitis. Peptic Ulcer disease secondary to H.pylori. small hiatal hernia. Nonbleeding duodenal AVM. Duodenal erosions. TREATED WITH PREVPAC  . ESOPHAGOGASTRODUODENOSCOPY N/A 03/15/2015   Dr. Gala Romney: small hiatal hernia, healed PUD, duodenal AVM.   Marland Kitchen ESOPHAGOGASTRODUODENOSCOPY N/A 05/01/2017   Dr. Gala Romney: LA grade a esophagitis, gastritis, no H pylori  . ESOPHAGOGASTRODUODENOSCOPY (EGD) WITH PROPOFOL N/A 08/19/2018   PROPOFOL;  Surgeon: Daneil Dolin, MD; normal esophagus, mild erythema and erosions in the entire stomach, small hiatal hernia, subtle white papular areas and duodenal bulb second and third portions of the duodenum.  . FRACTURE SURGERY    . LEFT HEART CATH AND CORONARY ANGIOGRAPHY N/A 03/12/2018   Procedure: LEFT HEART CATH AND CORONARY ANGIOGRAPHY;  Surgeon: Burnell Blanks, MD;  Location: [facility];  Service: Cardiovascular;  Laterality: N/A;  . Multiple bilateral  arm/wrist surgeries after falls    . ORIF SHOULDER FRACTURE Left 06/2002   "shattered; put a bunch of metal plates in it"  . PATCH ANGIOPLASTY Right 07/07/2016   Procedure: PATCH ANGIOPLASTY USING 0.8CM X 7.6CM HEMASHIELD PATCH;  Surgeon: Rosetta Posner, MD;  Location: [facility];  Service: Vascular;  Laterality: Right;  . SHOULDER ARTHROSCOPY W/ ROTATOR CUFF REPAIR Right 1999  . SHOULDER OPEN ROTATOR CUFF REPAIR Left 1992   Metal implant        Family History  Problem Relation Age of Onset  . COPD Mother   . Mental illness Mother   . Hypertension Mother   . Varicose Veins Mother   . Heart attack Mother 72  . Hypertension Father   . Alcohol abuse Father   . Colon cancer Neg Hx     Social History   Tobacco Use  . Smoking status: Former Smoker    Packs/day: 0.20    Years: 45.00    Pack years: 9.00    Types: Cigarettes    Quit date: 12/21/2017  Years since quitting: 3.0  . Smokeless tobacco: Never Used  Vaping Use  . Vaping Use: Former  Substance Use Topics  . Alcohol use: Yes    Alcohol/week: 0.0 standard drinks    Comment: 1-2 beers in 6 months.   . Drug use: No    Home Medications Prior to Admission medications   Medication Sig Start Date End Date Taking? Authorizing Provider  amLODipine (NORVASC) 10 MG tablet Take 1 tablet (10 mg total) by mouth daily. 11/16/20 02/14/21 Yes Verta Ellen., NP  aspirin EC 81 MG tablet Take 81 mg by mouth at bedtime.    Yes [provider]  atorvastatin (LIPITOR) 10 MG tablet Take 1 tablet (10 mg total) by mouth daily. 07/07/16  Yes Alvia Grove, PA-C  buPROPion (WELLBUTRIN SR) 150 MG 12 hr tablet Take 150 mg by mouth 2 (two) times daily.   Yes [provider]  buPROPion (ZYBAN) 150 MG 12 hr tablet Take 150 mg by mouth 2 (two) times daily.   Yes [provider]  fluticasone (FLONASE) 50 MCG/ACT nasal spray Place 2 sprays into both nostrils daily as needed for allergies.    Yes [provider]   hydrochlorothiazide (HYDRODIURIL) 12.5 MG tablet Take 12.5 mg by mouth daily. 11/29/18  Yes [provider]  isosorbide mononitrate (IMDUR) 30 MG 24 hr tablet Take 1 tablet (30 mg total) by mouth daily. 11/16/20 02/14/21 Yes Verta Ellen., NP  levothyroxine (SYNTHROID, LEVOTHROID) 150 MCG tablet Take 175 mcg by mouth daily before breakfast.    Yes [provider]  losartan (COZAAR) 50 MG tablet Take 1 tablet (50 mg total) by mouth daily. 09/12/18  Yes Herminio Commons, MD  metoprolol succinate (TOPROL XL) 25 MG 24 hr tablet Take 1 tablet (25 mg total) by mouth daily. 04/25/18 09/03/20 Yes Aviva Signs, MD  pantoprazole (PROTONIX) 40 MG tablet Take 40 mg by mouth daily.   Yes [provider]  levothyroxine (SYNTHROID) 175 MCG tablet Take 175 mcg by mouth daily. Patient not taking: No sig reported 01/14/21   [provider]  dicyclomine (BENTYL) 20 MG tablet Take 1 tablet (20 mg total) by mouth 2 (two) times daily. 06/07/16 06/07/16  Comer Locket, PA-C    Allergies    Lyrica [pregabalin], Celecoxib, and Codeine  Review of Systems   Review of Systems  Constitutional: Negative for chills and fever.  HENT: Negative for congestion, rhinorrhea and sore throat.   Eyes: Negative for visual disturbance.  Respiratory: Negative for cough and shortness of breath.   Cardiovascular: Positive for chest pain. Negative for leg swelling.  Gastrointestinal: Negative for abdominal pain, diarrhea, nausea and vomiting.  Genitourinary: Negative for dysuria.  Musculoskeletal: Negative for back pain and neck pain.  Skin: Negative for rash.  Neurological: Negative for dizziness, light-headedness and headaches.  Hematological: Does not bruise/bleed easily.  Psychiatric/Behavioral: Negative for confusion.    Physical Exam Updated Vital Signs BP (!) 106/45   Pulse (!) 57   Temp 98.4 F (36.9 C)   Resp 20   SpO2 97%   Physical Exam Vitals and nursing note  reviewed.  Constitutional:      Appearance: Normal appearance. He is well-developed and well-nourished.  HENT:     Head: Normocephalic and atraumatic.  Eyes:     Extraocular Movements: Extraocular movements intact.     Conjunctiva/sclera: Conjunctivae normal.     Pupils: Pupils are equal, round, and reactive to light.  Cardiovascular:  Rate and Rhythm: Normal rate and regular rhythm.     Heart sounds: No murmur heard.   Pulmonary:     Effort: Pulmonary effort is normal. No respiratory distress.     Breath sounds: Normal breath sounds.  Chest:     Chest wall: No tenderness.  Abdominal:     Palpations: Abdomen is soft.     Tenderness: There is no abdominal tenderness.  Musculoskeletal:        General: No swelling or edema. Normal range of motion.     Cervical back: Neck supple.  Skin:    General: Skin is warm and dry.     Capillary Refill: Capillary refill takes less than 2 seconds.  Neurological:     General: No focal deficit present.     Mental Status: He is alert and oriented to person, place, and time.     Cranial Nerves: No cranial nerve deficit.     Sensory: No sensory deficit.  Psychiatric:        Mood and Affect: Mood and affect normal.     ED Results / Procedures / Treatments   Labs (all labs ordered are listed, but only abnormal results are displayed) Labs Reviewed  COMPREHENSIVE METABOLIC PANEL - Abnormal; Notable for the following components:      Result Value   Glucose, Bld 148 (*)    Creatinine, Ser 1.26 (*)    Calcium 8.6 (*)    All other components within normal limits  CBC WITH DIFFERENTIAL/PLATELET  TROPONIN I (HIGH SENSITIVITY)  TROPONIN I (HIGH SENSITIVITY)    EKG EKG Interpretation  Date/Time:  Saturday January 22 2021 17:14:44 EST Ventricular Rate:  68 PR Interval:    QRS Duration: 145 QT Interval:  458 QTC Calculation: 488 R Axis:   101 Text Interpretation: Sinus rhythm Nonspecific intraventricular conduction delay Minimal ST  depression, inferior leads New since previous tracing But similar to EKG 04/24/18 at 1030 Confirmed by Fredia Sorrow [phone]) on 01/22/2021 5:27:40 PM   Radiology DG Chest Port 1 View  Result Date: 01/22/2021 CLINICAL DATA:  Onset chest pain yesterday. EXAM: PORTABLE CHEST 1 VIEW COMPARISON:  PA and lateral chest 03/12/2018. FINDINGS: The lungs are clear. Heart size is normal. There is no pneumothorax or pleural fluid. No acute or focal bony abnormality. IMPRESSION: No acute disease. Electronically Signed   By: Inge Rise M.D.   On: 01/22/2021 18:08    Procedures Procedures   Medications Ordered in ED Medications - No data to display  ED Course  I have reviewed the triage vital signs and the nursing notes.  Pertinent labs & imaging results that were available during my care of the patient were reviewed by me and considered in my medical decision making (see chart for details).    MDM Rules/Calculators/A&P                          Work-up for the chest pain troponins x2 very normal.  Which is extremely reassuring since this is been ongoing every 2 minutes since about 2:00 yesterday afternoon.  It was less during the night time.  Patient still having the shooting type pains.  Work-up negative.  No hypoxia.  Chest x-ray negative.  EKG without any acute changes.  Does show a nonspecific anterior ventricular conduction delay.  But no evidence of any acute ischemia.  Patient stable for discharge home follow-up with cardiology or returning for any chest pain lasting 20 minutes or longer.  Final Clinical Impression(s) / ED Diagnoses Final diagnoses:  Atypical chest pain    Rx / DC Orders ED Discharge Orders    None       Fredia Sorrow, MD 01/22/21 2129

## 2021-01-22 NOTE — Discharge Instructions (Addendum)
Work-up without evidence of any acute cardiac event.  Feel chest pain is not cardiac in nature.  But call cardiology for follow-up.  Return for any new or worse chest pain particular any chest pain lasting 20 minutes or longer.

## 2021-01-24 DIAGNOSIS — C44319 Basal cell carcinoma of skin of other parts of face: Secondary | ICD-10-CM | POA: Diagnosis not present

## 2021-01-31 DIAGNOSIS — I779 Disorder of arteries and arterioles, unspecified: Secondary | ICD-10-CM | POA: Diagnosis not present

## 2021-01-31 DIAGNOSIS — E785 Hyperlipidemia, unspecified: Secondary | ICD-10-CM | POA: Diagnosis not present

## 2021-01-31 DIAGNOSIS — I10 Essential (primary) hypertension: Secondary | ICD-10-CM | POA: Diagnosis not present

## 2021-01-31 DIAGNOSIS — I25118 Atherosclerotic heart disease of native coronary artery with other forms of angina pectoris: Secondary | ICD-10-CM | POA: Diagnosis not present

## 2021-02-01 ENCOUNTER — Ambulatory Visit: Payer: Medicare Other | Admitting: Family Medicine

## 2021-02-10 DIAGNOSIS — Z299 Encounter for prophylactic measures, unspecified: Secondary | ICD-10-CM | POA: Diagnosis not present

## 2021-02-10 DIAGNOSIS — Z79899 Other long term (current) drug therapy: Secondary | ICD-10-CM | POA: Diagnosis not present

## 2021-02-10 DIAGNOSIS — I201 Angina pectoris with documented spasm: Secondary | ICD-10-CM | POA: Diagnosis not present

## 2021-02-10 DIAGNOSIS — Z0000 Encounter for general adult medical examination without abnormal findings: Secondary | ICD-10-CM | POA: Diagnosis not present

## 2021-02-10 DIAGNOSIS — E7800 Pure hypercholesterolemia, unspecified: Secondary | ICD-10-CM | POA: Diagnosis not present

## 2021-02-10 DIAGNOSIS — I10 Essential (primary) hypertension: Secondary | ICD-10-CM | POA: Diagnosis not present

## 2021-02-10 DIAGNOSIS — R5383 Other fatigue: Secondary | ICD-10-CM | POA: Diagnosis not present

## 2021-02-10 DIAGNOSIS — E039 Hypothyroidism, unspecified: Secondary | ICD-10-CM | POA: Diagnosis not present

## 2021-02-10 DIAGNOSIS — Z7189 Other specified counseling: Secondary | ICD-10-CM | POA: Diagnosis not present

## 2021-03-04 ENCOUNTER — Ambulatory Visit: Payer: Medicare Other | Admitting: Gastroenterology

## 2021-03-05 NOTE — Progress Notes (Signed)
Referring Provider: Glenda Chroman, MD Primary Care Physician:  Glenda Chroman, MD Primary GI Physician: Dr. Gala Romney  Chief Complaint  Patient presents with  . Gastroesophageal Reflux    Doing ok    HPI:   Seth Savage is a 70 y.o. male presenting today for 20-monthfollow-up. He has history of fatty liver, GERD, PUD secondary to H. pylori s/p documented eradication by biopsies in 2018, chronic RUQ abdominal pain predating cholecystectomy in June 2019 s/p EGD in September 2019 with subtle abnormalities of stomach and duodenum of doubtful clinical significance (pathology benign) and RUQ [place] no significant findings.   Also with partial colectomy for intussusception in 2000, colon polyps, and rectal bleeding in the setting of hemorrhoids.   Last colonoscopy in June 2020 with moderate, medium size grade 1 internal hemorrhoids, diverticulosis in the sigmoid and descending colon, otherwise normal.  Due for repeat colonoscopy in 2025.   Last seen in our office 09/03/2020.  GERD well controlled on Protonix 40 mg daily.  Continued with chronic intermittent RUQ pain/bloating sensation that was more of an annoyance occurring 3-4 times a week to nothing for a couple of weeks.  Felt it was related to gas.  Noted sensation occurring with milk and deviled eggs.  No significant change over the years.  Some improvement with passing gas or burping.  Typically with 1 Bristol 6-7 BM daily.  Loose stools have been present since cholecystectomy.  Recommended following a low-fat and lactose-free diet, avoiding items that cause gas and bloating, continue Protonix daily.  Discussed trial of Colestid, but patient preferred to monitor for now as his loose stools were not bothersome.  Also counseled on fatty liver.  Follow-up in 6 months.  We received labs from PCP completed 07/09/2020.  CBC with no significant abnormalities.  CMP remarkable for alk phos slightly elevated at 123.  TSH within normal limits.  Advised  patient to continue to follow fatty liver instructions and we will recheck labs at follow-up visit.  Most recent labs 01/22/2021 with alk phos returned to normal.  AST, ALT, total bilirubin also within normal limits.  Today:  Today he states he is feeling well overall.  GERD: Well-controlled on Protonix 40 mg daily. Very rare sensation of breads going down his esophagus slowly.  No other significant dysphagia symptoms.  Doesn't feel he needs this evaluated. No nausea or vomiting.   Chronic intermittent RUQ pain/bloating: Decreased frequency with following a low-fat diet and limiting portion sizes.  States he used to overeat routinely.  He did not try lactose-free diet after his last visit.  He also tells me that several family members do follow a gluten-free diet though he is not sure if they have celiac disease.  He is not able to identify any specific dietary triggers.  Loose stools: Typically 1 BM in the morning, loose. Bristol 6. No change over the last 6 months. No brpr or melena.   Fatty liver: Lost 2 lbs over the last 6 months.  Denies yellowing of the eyes or skin, confusion, swelling in the abdomen or LE, or bruising.   Past Medical History:  Diagnosis Date  . Arthritis    "wrists, thumb on left hand" (03/12/2018)  . Back injury 2003   "broke my back in 2 places; never had any OR for it" (03/12/2018)  . Carotid artery occlusion   . Childhood asthma   . COPD (chronic obstructive pulmonary disease) ([place]   . Essential hypertension   .  Fatty liver   . GERD (gastroesophageal reflux disease)   . Goiter    Radioactive iodine treatment  . H pylori ulcer Jan 2016   Treated with Prevpac  . History of bronchitis   . History of kidney stones   . Hypercholesterolemia   . Hypertension   . Hypothyroid   . Migraine    "been about 2 yr w/out a headache" (03/12/2018)  . Neuropathy   . Pneumonia 1969  . Shortness of breath dyspnea    with exertion  . Wears glasses     Past Surgical  History:  Procedure Laterality Date  . BIOPSY  05/01/2017   Procedure: BIOPSY;  Surgeon: Daneil Dolin, MD;  [facility]: [facility];  Service: Endoscopy;;  gastric  . BIOPSY  08/19/2018   Procedure: BIOPSY;  Surgeon: Daneil Dolin, MD;  [facility]: [facility];  Service: Endoscopy;;  duodenal and gastric  . CARDIAC CATHETERIZATION N/A 07/05/2016   Procedure: Left Heart Cath and Coronary Angiography;  Surgeon: Burnell Blanks, MD;  Location: [facility];  Service: Cardiovascular;  Laterality: N/A;  . CARDIAC CATHETERIZATION  03/12/2018  . CAROTID ENDARTERECTOMY Left ?2009  . CARPAL TUNNEL RELEASE Right   . CHOLECYSTECTOMY N/A 04/24/2018   Procedure: LAPAROSCOPIC CHOLECYSTECTOMY;  Surgeon: Aviva Signs, MD;  Location: [facility];  Service: General;  Laterality: N/A;  . COLECTOMY  2000   Secondary to intussuception  . COLONOSCOPY  08/24/2004   Normal rectum,Small polyp at 25 cm, cold snared/ The remainder of the colonic mucosa appeared normal  . COLONOSCOPY  09/24/2012   Dr. Mike Craze hemorrhoids-likely source of hematochezia.Colonic diverticulosis  . COLONOSCOPY N/A 05/01/2017   Dr. Gala Romney: Diverticulosis, next colonoscopy in 5 years.  . COLONOSCOPY WITH PROPOFOL N/A 04/28/2019   Propofol; Dr. Gala Romney; moderate, medium sized grade 1 internal hemorrhoids, diverticulosis in sigmoid and descending colon, otherwise normal.  Repeat in 5 years.  Marland Kitchen ENDARTERECTOMY Right 07/07/2016   Procedure: ENDARTERECTOMY CAROTID;  Surgeon: Rosetta Posner, MD;  Location: [facility] OR;  Service: Vascular;  Laterality: Right;  . ESOPHAGOGASTRODUODENOSCOPY N/A 12/09/2014   Dr. Gala Romney: Erosive reflux esophagitis. Peptic Ulcer disease secondary to H.pylori. small hiatal hernia. Nonbleeding duodenal AVM. Duodenal erosions. TREATED WITH PREVPAC  . ESOPHAGOGASTRODUODENOSCOPY N/A 03/15/2015   Dr. Gala Romney: small hiatal hernia, healed PUD, duodenal AVM.   Marland Kitchen ESOPHAGOGASTRODUODENOSCOPY N/A 05/01/2017   Dr. Gala Romney: LA grade a  esophagitis, gastritis, no H pylori  . ESOPHAGOGASTRODUODENOSCOPY (EGD) WITH PROPOFOL N/A 08/19/2018   PROPOFOL;  Surgeon: Daneil Dolin, MD; normal esophagus, mild erythema and erosions in the entire stomach, small hiatal hernia, subtle white papular areas and duodenal bulb second and third portions of the duodenum.  . FRACTURE SURGERY    . LEFT HEART CATH AND CORONARY ANGIOGRAPHY N/A 03/12/2018   Procedure: LEFT HEART CATH AND CORONARY ANGIOGRAPHY;  Surgeon: Burnell Blanks, MD;  Location: [facility];  Service: Cardiovascular;  Laterality: N/A;  . Multiple bilateral arm/wrist surgeries after falls    . ORIF SHOULDER FRACTURE Left 06/2002   "shattered; put a bunch of metal plates in it"  . PATCH ANGIOPLASTY Right 07/07/2016   Procedure: PATCH ANGIOPLASTY USING 0.8CM X 7.6CM HEMASHIELD PATCH;  Surgeon: Rosetta Posner, MD;  Location: [facility];  Service: Vascular;  Laterality: Right;  . SHOULDER ARTHROSCOPY W/ ROTATOR CUFF REPAIR Right 1999  . SHOULDER OPEN ROTATOR CUFF REPAIR Left 1992   Metal implant     Current Outpatient Medications  Medication Sig Dispense  Refill  . amLODipine (NORVASC) 10 MG tablet Take 1 tablet (10 mg total) by mouth daily. 90 tablet 3  . aspirin EC 81 MG tablet Take 81 mg by mouth at bedtime.     Marland Kitchen buPROPion (WELLBUTRIN SR) 150 MG 12 hr tablet Take 150 mg by mouth 2 (two) times daily.    . fluticasone (FLONASE) 50 MCG/ACT nasal spray Place 2 sprays into both nostrils daily as needed for allergies.     . hydrochlorothiazide (HYDRODIURIL) 12.5 MG tablet Take 12.5 mg by mouth daily.    . isosorbide mononitrate (IMDUR) 60 MG 24 hr tablet Take 60 mg by mouth daily.    Marland Kitchen levothyroxine (SYNTHROID) 175 MCG tablet Take 175 mcg by mouth daily.    Marland Kitchen losartan (COZAAR) 50 MG tablet Take 1 tablet (50 mg total) by mouth daily. 90 tablet 3  . metoprolol succinate (TOPROL XL) 25 MG 24 hr tablet Take 1 tablet (25 mg total) by mouth daily. 90 tablet 0  . nitroGLYCERIN  (NITROSTAT) 0.4 MG SL tablet Place 1 tablet under the tongue as needed.    . pantoprazole (PROTONIX) 40 MG tablet Take 40 mg by mouth daily.    . rosuvastatin (CRESTOR) 20 MG tablet Take 20 mg by mouth at bedtime.    . isosorbide mononitrate (IMDUR) 30 MG 24 hr tablet Take 1 tablet (30 mg total) by mouth daily. (Patient not taking: Reported on 03/07/2021) 90 tablet 3   No current facility-administered medications for this visit.    Allergies as of 03/07/2021 - Review Complete 03/07/2021  Allergen Reaction Noted  . Lyrica [pregabalin] Shortness Of Breath 09/17/2012  . Celecoxib Other (See Comments)   . Codeine Itching 11/09/2014    Family History  Problem Relation Age of Onset  . COPD Mother   . Mental illness Mother   . Hypertension Mother   . Varicose Veins Mother   . Heart attack Mother 58  . Hypertension Father   . Alcohol abuse Father   . Colon cancer Neg Hx     Social History   Socioeconomic History  . Marital status: Married    Spouse name: [name]  . Number of children: 0  . Years of education: Ged  . Highest education level: Not on file  Occupational History    Comment: Disabled  Tobacco Use  . Smoking status: Former Smoker    Packs/day: 0.20    Years: 45.00    Pack years: 9.00    Types: Cigarettes    Quit date: 12/21/2017    Years since quitting: 3.2  . Smokeless tobacco: Never Used  Vaping Use  . Vaping Use: Former  Substance and Sexual Activity  . Alcohol use: Yes    Alcohol/week: 0.0 standard drinks    Comment: 1-2 beers in 6 months.   . Drug use: No  . Sexual activity: Not Currently  Other Topics Concern  . Not on file  Social History Narrative   Patient lives at home with his wife. [facility]).    Disabled.   Right handed.   Caffeine- two cups daily.   Three step children.   Social Determinants of Health   Financial Resource Strain: Not on file  Food Insecurity: Not on file  Transportation Needs: Not on file  Physical Activity: Not on file   Stress: Not on file  Social Connections: Not on file    Review of Systems: Gen: Denies fever, chills, cold or flu like symptoms, lightheadedness, dizziness, presyncope, syncope. CV: Denies chest  pain or palpitations. Resp: Chronic history of shortness of breath with exertion.  No shortness of breath at rest.  No regular cough. GI: See HPI Heme: See HPI  Physical Exam: BP (!) 145/67   Pulse 66   Temp (!) 96.6 F (35.9 C) (Temporal)   Ht 6' (1.829 m)   Wt 219 lb (99.3 kg)   BMI 29.70 kg/m  General:   Alert and oriented. No distress noted. Pleasant and cooperative.  Head:  Normocephalic and atraumatic. Eyes:  Conjuctiva clear without scleral icterus. Heart:  S1, S2 present without murmurs appreciated. Lungs:  Clear to auscultation bilaterally. No wheezes, rales, or rhonchi. No distress.  Abdomen:  +BS, soft, non-tender and non-distended. No rebound or guarding. No HSM or masses noted. Msk:  Symmetrical without gross deformities. Normal posture. Extremities:  Without edema. Neurologic:  Alert and  oriented x4 Psych:  Normal mood and affect.

## 2021-03-07 ENCOUNTER — Ambulatory Visit: Payer: Medicare Other | Admitting: Gastroenterology

## 2021-03-07 ENCOUNTER — Encounter: Payer: Self-pay | Admitting: Internal Medicine

## 2021-03-07 ENCOUNTER — Other Ambulatory Visit: Payer: Self-pay

## 2021-03-07 ENCOUNTER — Encounter: Payer: Self-pay | Admitting: Gastroenterology

## 2021-03-07 VITALS — BP 145/67 | HR 66 | Temp 96.6°F | Ht 72.0 in | Wt 219.0 lb

## 2021-03-07 DIAGNOSIS — K760 Fatty (change of) liver, not elsewhere classified: Secondary | ICD-10-CM

## 2021-03-07 DIAGNOSIS — R1310 Dysphagia, unspecified: Secondary | ICD-10-CM | POA: Diagnosis not present

## 2021-03-07 DIAGNOSIS — R1011 Right upper quadrant pain: Secondary | ICD-10-CM | POA: Diagnosis not present

## 2021-03-07 DIAGNOSIS — R195 Other fecal abnormalities: Secondary | ICD-10-CM | POA: Diagnosis not present

## 2021-03-07 DIAGNOSIS — K219 Gastro-esophageal reflux disease without esophagitis: Secondary | ICD-10-CM

## 2021-03-07 NOTE — Assessment & Plan Note (Signed)
Well-controlled on Protonix 40 mg daily.  Advised to continue his current medications and follow-up in 6 months or sooner if needed.

## 2021-03-07 NOTE — Patient Instructions (Addendum)
Please have labs completed at Primary Care. Please have labs faxed back to our office.   Continue Protonix 40 mg daily 30 minutes before breakfast.   Follow a GERD diet:  Avoid fried, fatty, greasy, spicy, citrus foods. Avoid caffeine and carbonated beverages. Avoid chocolate. Try eating 4-6 small meals a day rather than 3 large meals. Do not eat within 3 hours of laying down. Prop head of bed up on wood or bricks to create a 6 inch incline.  For bloating in the right upper abdomen: We are screening you for celiac disease. Trial a lactose free diet for 4 weeks or take lactaid tablets prior to consuming dairy.  Continue to follow a low fat diet.  Continue limiting portion sizes.   Instructions for fatty liver: Recommend 1-2# weight loss per week until ideal body weight through exercise & diet. Low fat/cholesterol diet.   Avoid sweets, sodas, fruit juices, sweetened beverages like tea, etc. Gradually increase exercise from 15 min daily up to 1 hr per day 5 days/week. Continue to limit alcohol use.   It was good to see you today! We will see you back in 6 months. Please call if you have questions or concerns prior.    Aliene Altes, PA-C [facility]    Lactose-Free Diet, Adult If you have lactose intolerance, you are not able to digest lactose. Lactose is a natural sugar found mainly in dairy milk and dairy products. You may need to avoid all foods and beverages that contain lactose. A lactose-free diet can help you do this. Which foods have lactose? Lactose is found in dairy milk and dairy products, such as:  Yogurt.  Cheese.  Butter.  Margarine.  Sour cream.  Cream.  Whipped toppings and nondairy creamers.  Ice cream and other dairy-based desserts. Lactose is also found in foods or products made with dairy milk or milk ingredients. To find out whether a food contains dairy milk or a milk ingredient, look at the ingredients list. Avoid foods with  the statement "May contain milk" and foods that contain:  Milk powder.  Whey.  Curd.  Caseinate.  Lactose.  Lactalbumin.  Lactoglobulin. What are alternatives to dairy milk and foods made with milk products?  Lactose-free milk.  Soy milk with added calcium and vitamin D.  Almond milk, coconut milk, rice milk, or other nondairy milk alternatives with added calcium and vitamin D. Note that these are low in protein.  Soy products, such as soy yogurt, soy cheese, soy ice cream, and soy-based sour cream.  Other nut milk products, such as almond yogurt, almond cheese, cashew yogurt, cashew cheese, cashew ice cream, coconut yogurt, and coconut ice cream. What are tips for following this plan?  Do not consume foods, beverages, vitamins, minerals, or medicines containing lactose. Read ingredient lists carefully.  Look for the words "lactose-free" on labels.  Use lactase enzyme drops or tablets as directed by your health care provider.  Use lactose-free milk or a milk alternative, such as soy milk or almond milk, for drinking and cooking.  Make sure you get enough calcium and vitamin D in your diet. A lactose-free eating plan can be lacking in these important nutrients.  Take calcium and vitamin D supplements as directed by your health care provider. Talk to your health care provider about supplements if you are not able to get enough calcium and vitamin D from food. What foods can I eat? Fruits All fresh, canned, frozen, or dried fruits that are not processed with  lactose. Vegetables All fresh, frozen, and canned vegetables without cheese, cream, or butter sauces. Grains Any that are not made with dairy milk or dairy products. Meats and other proteins Any meat, fish, poultry, and other protein sources that are not made with dairy milk or dairy products. Soy cheese and yogurt. Fats and oils Any that are not made with dairy milk or dairy products. Beverages Lactose-free milk.  Soy, rice, or almond milk with added calcium and vitamin D. Fruit and vegetable juices. Sweets and desserts Any that are not made with dairy milk or dairy products. Seasonings and condiments Any that are not made with dairy milk or dairy products. Calcium Calcium is found in many foods that contain lactose and is important for bone health. The amount of calcium you need depends on your age:  Adults younger than 50 years: 1,000 mg of calcium a day.  Adults older than 50 years: 1,200 mg of calcium a day. If you are not getting enough calcium, you may get it from other sources, including:  Orange juice with calcium added. There are 300-350 mg of calcium in 1 cup of orange juice.  Calcium-fortified soy milk. There are 300-400 mg of calcium in 1 cup of calcium-fortified soy milk.  Calcium-fortified rice or almond milk. There are 300 mg of calcium in 1 cup of calcium-fortified rice or almond milk.  Calcium-fortified breakfast cereals. There are 100-1,000 mg of calcium in calcium-fortified breakfast cereals.  Spinach, cooked. There are 145 mg of calcium in  cup of cooked spinach.  Edamame, cooked. There are 130 mg of calcium in  cup of cooked edamame.  Collard greens, cooked. There are 125 mg of calcium in  cup of cooked collard greens.  Kale, frozen or cooked. There are 90 mg of calcium in  cup of cooked or frozen kale.  Almonds. There are 95 mg of calcium in  cup of almonds.  Broccoli, cooked. There are 60 mg of calcium in 1 cup of cooked broccoli. The items listed above may not be a complete list of recommended foods and beverages. Contact a dietitian for more options.   What foods are not recommended? Fruits None, unless they are made with dairy milk or dairy products. Vegetables None, unless they are made with dairy milk or dairy products. Grains Any grains that are made with dairy milk or dairy products. Meats and other proteins None, unless they are made with dairy milk  or dairy products. Dairy All dairy products, including milk, goat's milk, buttermilk, kefir, acidophilus milk, flavored milk, evaporated milk, condensed milk, dulce de [place], eggnog, yogurt, cheese, and cheese spreads. Fats and oils Any that are made with milk or milk products. Margarines and salad dressings that contain milk or cheese. Cream. Half and half. Cream cheese. Sour cream. Chip dips made with sour cream or yogurt. Beverages Hot chocolate. Cocoa with lactose. Instant iced teas. Powdered fruit drinks. Smoothies made with dairy milk or yogurt. Sweets and desserts Any that are made with milk or milk products. Seasonings and condiments Chewing gum that has lactose. Spice blends if they contain lactose. Artificial sweeteners that contain lactose. Nondairy creamers. The items listed above may not be a complete list of foods and beverages to avoid. Contact a dietitian for more information. Summary  If you are lactose intolerant, it means that you have a hard time digesting lactose, a natural sugar found in milk and milk products.  Following a lactose-free diet can help you manage this condition.  Calcium is  important for bone health and is found in many foods that contain lactose. Talk with your health care provider about other sources of calcium. This information is not intended to replace advice given to you by your health care provider. Make sure you discuss any questions you have with your health care provider. Document Revised: 12/04/2017 Document Reviewed: 12/04/2017 Elsevier Patient Education  2021 [place].  Nonalcoholic Fatty Liver Disease Diet, Adult Nonalcoholic fatty liver disease is a condition that causes fat to build up in and around the liver. The disease makes it harder for the liver to work the way that it should. Following a healthy diet can help to keep nonalcoholic fatty liver disease under control. It can also help to prevent or improve conditions that are  associated with the disease, such as heart disease, diabetes, high blood pressure, and abnormal cholesterol levels. Along with regular exercise, this diet:  Promotes weight loss.  Helps to control blood sugar levels.  Helps to improve the way that the body uses insulin. What are tips for following this plan? Reading food labels Always check food labels for:  The amount of saturated fat in a food. You should limit your intake of saturated fat. Saturated fat is found in foods that come from animals, including meat and dairy products such as butter, cheese, and whole milk.  The amount of fiber in a food. You should choose high-fiber foods such as fruits, vegetables, and whole grains. Try to get 25-30 grams (g) of fiber a day.   Cooking  When cooking, use heart-healthy oils that are high in monounsaturated fats. These include olive oil, canola oil, and avocado oil.  Limit frying or deep-frying foods. Cook foods using healthy methods such as baking, boiling, steaming, and grilling instead. Meal planning  You may want to keep track of how many calories you take in. Eating the right amount of calories will help you achieve a healthy weight. Meeting with a registered dietitian can help you get started.  Limit how often you eat takeout and fast food. These foods are usually very high in fat, salt, and sugar.  Use the glycemic index (GI) to plan your meals. The index tells you how quickly a food will raise your blood sugar. Choose low-GI foods (GI less than 55). These foods take a longer time to raise blood sugar. A registered dietitian can help you identify foods lower on the GI scale. Lifestyle  You may want to follow a Mediterranean diet. This diet includes a lot of vegetables, lean meats or fish, whole grains, fruits, and healthy oils and fats. What foods can I eat? Fruits Bananas. Apples. Oranges. Grapes. Papaya. Mango. Pomegranate. Kiwi. Grapefruit. Cherries. Vegetables Lettuce.  Spinach. Peas. Beets. Cauliflower. Cabbage. Broccoli. Carrots. Tomatoes. Squash. Eggplant. Herbs. Peppers. Onions. Cucumbers. Brussels sprouts. Yams and sweet potatoes. Beans. Lentils. Grains Whole wheat or whole-grain foods, including breads, crackers, cereals, and pasta. Stone-ground whole wheat. Unsweetened oatmeal. Bulgur. Barley. Quinoa. Brown or wild rice. Corn or whole wheat flour tortillas. Meats and other proteins Lean meats. Poultry. Tofu. Seafood and shellfish. Dairy Low-fat or fat-free dairy products, such as yogurt, cottage cheese, or cheese. Beverages Water. Sugar-free drinks. Tea. Coffee. Low-fat or skim milk. Milk alternatives, such as soy or almond milk. Real fruit juice. Fats and oils Avocado. Canola or olive oil. Nuts and nut butters. Seeds. Seasonings and condiments Mustard. Relish. Low-fat, low-sugar ketchup and barbecue sauce. Low-fat or fat-free mayonnaise. Sweets and desserts Sugar-free sweets. The items listed above may not  be a complete list of foods and beverages you can eat. Contact a dietitian for more information.   What foods should I limit or avoid? Meats and other proteins Limit red meat to 1-2 times a week. Dairy [employer]. Fats and oils Palm oil and coconut oil. Fried foods. Other foods Processed foods. Foods that contain a lot of salt or sodium. Sweets and desserts Sweets that contain sugar. Beverages Sweetened drinks, such as sweet tea, milkshakes, iced sweet drinks, and sodas. Alcohol. The items listed above may not be a complete list of foods and beverages you should avoid. Contact a dietitian for more information. Where to find more information The [employer] of Diabetes and Digestive and Kidney Diseases: [employer] Summary  Nonalcoholic fatty liver disease is a condition that causes fat to build up in and around the liver.  Following a healthy diet can help to keep nonalcoholic fatty liver disease under control. Your diet  should be rich in fruits, vegetables, whole grains, and lean proteins.  Limit your intake of saturated fat. Saturated fat is found in foods that come from animals, including meat and dairy products such as butter, cheese, and whole milk.  This diet promotes weight loss, helps to control blood sugar levels, and helps to improve the way that the body uses insulin. This information is not intended to replace advice given to you by your health care provider. Make sure you discuss any questions you have with your health care provider. Document Revised: 02/28/2019 Document Reviewed: 11/28/2018 Elsevier Patient Education  [place].

## 2021-03-07 NOTE — Assessment & Plan Note (Signed)
Fatty liver noted on prior imaging.  No signs or symptoms of decompensated liver disease.  Recent labs in March 2021 with LFTs and platelets within normal limits.  Very rare alcohol use.   Plan:  Counseled on clinical course of fatty liver with potential to progress over time to fibrosis and/or cirrhosis.  Instructions for fatty liver: Recommend 1-2# weight loss per week until ideal body weight through exercise & diet. Low fat/cholesterol diet.   Avoid sweets, sodas, fruit juices, sweetened beverages like tea, etc. Gradually increase exercise from 15 min daily up to 1 hr per day 5 days/week. Continue to limit alcohol use.  Nonalcoholic fatty liver disease handout was also provided.

## 2021-03-07 NOTE — Assessment & Plan Note (Addendum)
Chronic history of intermittent RUQ abdominal discomfort which he describes as a bloated sensation.  Overall, symptoms are improved with limiting portion sizes as he used to overeat routinely and following more of a low-fat diet. Symptoms occur much less frequently.  Unable to identify any food triggers.  He does tell me that several family members follow a gluten-free diet though he does not know if they have celiac disease.  He is without alarm symptoms and his abdominal exam is benign.  He has history of cholecystectomy.  EGD in 2019 with subtle abnormalities of the stomach and duodenum of doubtful clinical significance (pathology benign).  RUQ ultrasound with no significant findings.  GERD remains well controlled on daily PPI.  Plan: 1.  Screen for celiac disease with TTG IgA and IgA total. 2.  Continue limiting portion sizes. 3.  Trial lactose-free diet for 4 weeks or take Lactaid tablets prior to dairy consumption. 4.  Continue to follow low-fat diet. 5.  Continue Protonix 40 mg daily. 6.  Follow-up in 6 months.

## 2021-03-07 NOTE — Assessment & Plan Note (Addendum)
Chronically loose stools since cholecystectomy in 2019.  Typically with 1 BM daily that is [place] 6.  No alarm symptoms.  Colonoscopy up-to-date in June 2020, due for repeat in 2025.   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 May also have dietary intolerances. Query lactose intolerance.  We will also screen for celiac disease as patient reports several of his family members follow a gluten-free diet.  He is not sure if they have celiac disease.  Plan:  1.  TTG IgA and IgA total. 2.  Continue low-fat diet. 3.  Trial of lactose-free diet x4 weeks or take Lactaid tablets prior to dairy consumption.  Lactose-free diet handout provided. 4.  Follow-up in 6 months or sooner if needed.

## 2021-03-07 NOTE — Assessment & Plan Note (Signed)
Very rare sensation of breads going down his esophagus slowly.  No other food or liquid dysphagia.  History of GERD well-controlled on Protonix 40 mg daily.  Discussed possibility of EGD/dilation for further evaluation and therapeutic intervention versus BPE, but patient does not feel he needs any further evaluation at this time.  He will continue to monitor for now and let us know if any worsening.

## 2021-03-08 LAB — TISSUE TRANSGLUTAMINASE, IGA: Transglutaminase IgA: <2 U/mL (ref 0–3)

## 2021-03-08 LAB — IGA: IgA/Immunoglobulin A, Serum: 296 mg/dL (ref 61–437)

## 2021-03-14 DIAGNOSIS — Z08 Encounter for follow-up examination after completed treatment for malignant neoplasm: Secondary | ICD-10-CM | POA: Diagnosis not present

## 2021-03-14 DIAGNOSIS — Z85828 Personal history of other malignant neoplasm of skin: Secondary | ICD-10-CM | POA: Diagnosis not present

## 2021-06-13 DIAGNOSIS — I779 Disorder of arteries and arterioles, unspecified: Secondary | ICD-10-CM | POA: Diagnosis not present

## 2021-06-13 DIAGNOSIS — M546 Pain in thoracic spine: Secondary | ICD-10-CM | POA: Diagnosis not present

## 2021-06-13 DIAGNOSIS — Z299 Encounter for prophylactic measures, unspecified: Secondary | ICD-10-CM | POA: Diagnosis not present

## 2021-06-13 DIAGNOSIS — I10 Essential (primary) hypertension: Secondary | ICD-10-CM | POA: Diagnosis not present

## 2021-06-13 DIAGNOSIS — I700 Atherosclerosis of aorta: Secondary | ICD-10-CM | POA: Diagnosis not present

## 2021-06-22 DIAGNOSIS — N390 Urinary tract infection, site not specified: Secondary | ICD-10-CM | POA: Diagnosis not present

## 2021-06-22 DIAGNOSIS — N200 Calculus of kidney: Secondary | ICD-10-CM | POA: Diagnosis not present

## 2021-08-09 DIAGNOSIS — I779 Disorder of arteries and arterioles, unspecified: Secondary | ICD-10-CM | POA: Diagnosis not present

## 2021-08-09 DIAGNOSIS — I25118 Atherosclerotic heart disease of native coronary artery with other forms of angina pectoris: Secondary | ICD-10-CM | POA: Diagnosis not present

## 2021-08-09 DIAGNOSIS — E785 Hyperlipidemia, unspecified: Secondary | ICD-10-CM | POA: Diagnosis not present

## 2021-08-09 DIAGNOSIS — I10 Essential (primary) hypertension: Secondary | ICD-10-CM | POA: Diagnosis not present

## 2021-09-01 DIAGNOSIS — J069 Acute upper respiratory infection, unspecified: Secondary | ICD-10-CM | POA: Diagnosis not present

## 2021-09-01 DIAGNOSIS — I10 Essential (primary) hypertension: Secondary | ICD-10-CM | POA: Diagnosis not present

## 2021-09-01 DIAGNOSIS — E7800 Pure hypercholesterolemia, unspecified: Secondary | ICD-10-CM | POA: Diagnosis not present

## 2021-09-01 DIAGNOSIS — Z299 Encounter for prophylactic measures, unspecified: Secondary | ICD-10-CM | POA: Diagnosis not present

## 2021-09-05 NOTE — Progress Notes (Signed)
Referring Provider: Glenda Chroman, MD Primary Care Physician:  Glenda Chroman, MD Primary GI Physician: Dr. Gala Romney  Chief Complaint  Patient presents with   Gastroesophageal Reflux    Doing ok    HPI:   Seth Savage is a 70 y.o. male presenting today for 30-month follow-up. He has history of fatty liver, GERD, PUD secondary to H. pylori s/p documented eradication by biopsies in 2018, chronic RUQ abdominal pain predating cholecystectomy in June 2019 s/p CT A/P with contrast July 2019 with small post op fluid collection, no other significant findings, EGD in September 2019 with subtle abnormalities of stomach and duodenum of doubtful clinical significance (pathology benign), and RUQ [place] with no significant findings.    Also with partial colectomy for intussusception in 2000, colon polyps, rectal bleeding in the setting of hemorrhoids, and chronic loose stool following cholecystectomy.   Last colonoscopy in June 2020 with moderate, medium size grade 1 internal hemorrhoids, diverticulosis in the sigmoid and descending colon, otherwise normal.  Due for repeat colonoscopy in 2025.   Last seen in our office 03/07/2021.  GERD well controlled on Protonix 40 mg daily.  Reported rare sensation of breads going down his esophagus slowly, but did not feel evaluation was needed.  Continued with chronic intermittent RUQ abdominal pain/bloating, but decreased frequency with following a low-fat diet and limiting portion sizes.  Reported several family members following a gluten-free diet, but unsure if they have celiac disease.  Continued with chronic loose stool s/p cholecystectomy with 1 BM per day, not bothered by this.  No alarm symptoms.  Regarding fatty liver, he had lost 2 pounds over the last 6 months.  No signs or symptoms of decompensated liver disease.  Plan included screening for celiac disease, continue low-fat diet and limiting portion sizes, trial lactose-free diet or take Lactaid tablets prior to  dairy consumption, continue Protonix 40 mg daily, counseled on fatty liver and encouraged further weight loss, follow-up in 6 months.  Celiac screen was negative.  Today:  GERD: Well controlled with Protonix 40 mg daily. No nausea or vomiting. No abdominal pain.   Dysphagia: No longer an issue.   Chronic intermittent RUQ pain/bloating: Resolved with eating a healthy diet and limiting portion sizes. States he used to over eat.   Fatty liver: Down 5 pounds over the last 6 months.  Total weight loss of about 10 pounds over the last 1.5 years. Healthy diet. Limiting portion sizes. 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  Bowels are moving well.  1 loose to firm BM daily. No constipation or diarrhea. No brbpr or melena.    Past Medical History:  Diagnosis Date   Arthritis    "wrists, thumb on left hand" (03/12/2018)   Back injury 2003   "broke my back in 2 places; never had any OR for it" (03/12/2018)   Carotid artery occlusion    Childhood asthma    COPD (chronic obstructive pulmonary disease) ([place])    Essential hypertension    Fatty liver    GERD (gastroesophageal reflux disease)    Goiter    Radioactive iodine treatment   H pylori ulcer 11/2014   Treated with Prevpac; documented eradication by biopsy in 2018   History of bronchitis    History of kidney stones    Hypercholesterolemia    Hypertension    Hypothyroid    Migraine    "been about 2 yr w/out a headache" (03/12/2018)  Neuropathy    Pneumonia 1969   Shortness of breath dyspnea    with exertion   Wears glasses     Past Surgical History:  Procedure Laterality Date   BIOPSY  05/01/2017   Procedure: BIOPSY;  Surgeon: Daneil Dolin, MD;  [facility]: [facility];  Service: Endoscopy;;  gastric   BIOPSY  08/19/2018   Procedure: BIOPSY;  Surgeon: Daneil Dolin, MD;  Location: [facility];  Service: Endoscopy;;  duodenal and gastric   CARDIAC CATHETERIZATION N/A 07/05/2016    Procedure: Left Heart Cath and Coronary Angiography;  Surgeon: Burnell Blanks, MD;  Location: [place] [facility];  Service: Cardiovascular;  Laterality: N/A;   CARDIAC CATHETERIZATION  03/12/2018   CAROTID ENDARTERECTOMY Left ?2009   CARPAL TUNNEL RELEASE Right    CHOLECYSTECTOMY N/A 04/24/2018   Procedure: LAPAROSCOPIC CHOLECYSTECTOMY;  Surgeon: Aviva Signs, MD;  Location: [facility];  Service: General;  Laterality: N/A;   COLECTOMY  2000   Secondary to intussuception   COLONOSCOPY  08/24/2004   Normal rectum,Small polyp at 25 cm, cold snared/ The remainder of the colonic mucosa appeared normal   COLONOSCOPY  09/24/2012   Dr. Mike Craze hemorrhoids-likely source of hematochezia.Colonic diverticulosis   COLONOSCOPY N/A 05/01/2017   Dr. Gala Romney: Diverticulosis, next colonoscopy in 5 years.   COLONOSCOPY WITH PROPOFOL N/A 04/28/2019   Propofol; Dr. Gala Romney; moderate, medium sized grade 1 internal hemorrhoids, diverticulosis in sigmoid and descending colon, otherwise normal.  Repeat in 5 years.   ENDARTERECTOMY Right 07/07/2016   Procedure: ENDARTERECTOMY CAROTID;  Surgeon: Rosetta Posner, MD;  Location[facility] OR;  Service: Vascular;  Laterality: Right;   ESOPHAGOGASTRODUODENOSCOPY N/A 12/09/2014   Dr. Gala Romney: Erosive reflux esophagitis. Peptic Ulcer disease secondary to H.pylori. small hiatal hernia. Nonbleeding duodenal AVM. Duodenal erosions. TREATED WITH PREVPAC   ESOPHAGOGASTRODUODENOSCOPY N/A 03/15/2015   Dr. Gala Romney: small hiatal hernia, healed PUD, duodenal AVM.    ESOPHAGOGASTRODUODENOSCOPY N/A 05/01/2017   Dr. Gala Romney: LA grade a esophagitis, gastritis, no H pylori   ESOPHAGOGASTRODUODENOSCOPY (EGD) WITH PROPOFOL N/A 08/19/2018   PROPOFOL;  Surgeon: Daneil Dolin, MD; normal esophagus, mild erythema and erosions in the entire stomach, small hiatal hernia, subtle white papular areas and duodenal bulb second and third portions of the duodenum.   FRACTURE SURGERY     LEFT HEART CATH AND CORONARY  ANGIOGRAPHY N/A 03/12/2018   Procedure: LEFT HEART CATH AND CORONARY ANGIOGRAPHY;  Surgeon: Burnell Blanks, MD;  Location: [place] [facility];  Service: Cardiovascular;  Laterality: N/A;   Multiple bilateral arm/wrist surgeries after falls     ORIF SHOULDER FRACTURE Left 06/2002   "shattered; put a bunch of metal plates in it"   PATCH ANGIOPLASTY Right 07/07/2016   Procedure: PATCH ANGIOPLASTY USING 0.8CM X 7.6CM HEMASHIELD PATCH;  Surgeon: Rosetta Posner, MD;  Location: [facility] OR;  Service: Vascular;  Laterality: Right;   SHOULDER ARTHROSCOPY W/ ROTATOR CUFF REPAIR Right 1999   SHOULDER OPEN ROTATOR CUFF REPAIR Left 1992   Metal implant     Current Outpatient Medications  Medication Sig Dispense Refill   amLODipine (NORVASC) 10 MG tablet Take 1 tablet (10 mg total) by mouth daily. 90 tablet 3   aspirin EC 81 MG tablet Take 81 mg by mouth at bedtime.      buPROPion (WELLBUTRIN SR) 150 MG 12 hr tablet Take 150 mg by mouth 2 (two) times daily.     fluticasone (FLONASE) 50 MCG/ACT nasal spray Place 2 sprays into both nostrils  daily as needed for allergies.      hydrochlorothiazide (HYDRODIURIL) 12.5 MG tablet Take 12.5 mg by mouth daily.     isosorbide mononitrate (IMDUR) 30 MG 24 hr tablet Take 1 tablet (30 mg total) by mouth daily. 90 tablet 3   levothyroxine (SYNTHROID) 175 MCG tablet Take 175 mcg by mouth daily.     losartan (COZAAR) 50 MG tablet Take 1 tablet (50 mg total) by mouth daily. 90 tablet 3   metoprolol succinate (TOPROL XL) 25 MG 24 hr tablet Take 1 tablet (25 mg total) by mouth daily. 90 tablet 0   nitroGLYCERIN (NITROSTAT) 0.4 MG SL tablet Place 1 tablet under the tongue as needed.     pantoprazole (PROTONIX) 40 MG tablet Take 40 mg by mouth daily.     rosuvastatin (CRESTOR) 20 MG tablet Take 20 mg by mouth at bedtime.     No current facility-administered medications for this visit.    Allergies as of 09/07/2021 - Review Complete 09/07/2021  Allergen Reaction Noted    Lyrica [pregabalin] Shortness Of Breath 09/17/2012   Celecoxib Other (See Comments)    Codeine Itching 11/09/2014    Family History  Problem Relation Age of Onset   COPD Mother    Mental illness Mother    Hypertension Mother    Varicose Veins Mother    Heart attack Mother 24   Hypertension Father    Alcohol abuse Father    Colon cancer Neg Hx     Social History   Socioeconomic History   Marital status: Married    Spouse name: Margie   Number of children: 0   Years of education: Ged   Highest education level: Not on file  Occupational History    Comment: Disabled  Tobacco Use   Smoking status: Former    Packs/day: 0.20    Years: 45.00    Pack years: 9.00    Types: Cigarettes    Quit date: 12/21/2017    Years since quitting: 3.7   Smokeless tobacco: Never  Vaping Use   Vaping Use: Former  Substance and Sexual Activity   Alcohol use: Yes    Alcohol/week: 0.0 standard drinks    Comment: 1-2 beers in 6 months.    Drug use: No   Sexual activity: Not Currently  Other Topics Concern   Not on file  Social History Narrative   Patient lives at home with his wife. [facility]).    Disabled.   Right handed.   Caffeine- two cups daily.   Three step children.   Social Determinants of Health   Financial Resource Strain: Not on file  Food Insecurity: Not on file  Transportation Needs: Not on file  Physical Activity: Not on file  Stress: Not on file  Social Connections: Not on file    Review of Systems: Gen: Denies fever, chills, cold or flulike symptoms, presyncope, syncope. CV: Denies chest pain, palpitations. Resp: Denies dyspnea or cough. GI: See HPI Heme: See HPI  Physical Exam: BP (!) 119/59   Pulse 62   Temp (!) 96.8 F (36 C) (Temporal)   Ht 6' (1.829 m)   Wt 214 lb 3.2 oz (97.2 kg)   BMI 29.05 kg/m  General:   Alert and oriented. No distress noted. Pleasant and cooperative.  Head:  Normocephalic and atraumatic. Eyes:  Conjuctiva clear without scleral  icterus. Heart:  S1, S2 present without murmurs appreciated. Lungs:  Clear to auscultation bilaterally. No wheezes, rales, or rhonchi. No distress.  Abdomen:  +  BS, soft, non-tender and non-distended. No rebound or guarding. No HSM or masses noted. Msk:  Symmetrical without gross deformities. Normal posture. Extremities:  Without edema. Neurologic:  Alert and  oriented x4 Psych:  Normal mood and affect.    Assessment: 70 year old male with history of liver, GERD, PUD secondary to H. pylori s/p documented eradication biopsy in 2018, chronic RUQ abdominal pain predating cholecystectomy in June 2019 with extensive evaluation previously detailed in HPI(benign), colon polyps with colonoscopy up-to-date, and chronic loose stool following cholecystectomy, presenting today for follow-up of GERD, dysphagia, fatty liver, and RUQ pain.  GERD:  Well-controlled on Protonix 40 mg daily.    Dysphagia:  Prior reports of vague dysphagia have resolved.  Will monitor for return of symptoms.  Nonalcoholic fatty liver disease:  No signs or symptoms of decompensated liver disease.  He is down 5 pounds over the last 6 months, total of 10 pounds over the last 1.5 years through dietary changes.  He was congratulated on this encouraged to continue his efforts.  No significant alcohol use. LFTs have remained within normal limits. No history of diabetes.   Chronic RUQ abdominal pain:  Resolved with following a healthier diet and limiting portion sizes.   Plan:  Continue Protonix 40 mg daily. Instructions for fatty liver: Recommend 1-2# weight loss per week until ideal body weight through exercise & diet. Low fat/cholesterol diet.   Avoid sweets, sodas, fruit juices, sweetened beverages like tea, etc. Gradually increase exercise from 15 min daily up to 1 hr per day 5 days/week. Limit alcohol use. Follow-up in 1 year for GERD and fatty liver. Return sooner if needed.    Aliene Altes, PA-C [facility]  Gastroenterology 09/07/2021

## 2021-09-07 ENCOUNTER — Other Ambulatory Visit: Payer: Self-pay

## 2021-09-07 ENCOUNTER — Encounter: Payer: Self-pay | Admitting: Gastroenterology

## 2021-09-07 ENCOUNTER — Ambulatory Visit: Payer: Medicare Other | Admitting: Gastroenterology

## 2021-09-07 VITALS — BP 119/59 | HR 62 | Temp 96.8°F | Ht 72.0 in | Wt 214.2 lb

## 2021-09-07 DIAGNOSIS — K760 Fatty (change of) liver, not elsewhere classified: Secondary | ICD-10-CM

## 2021-09-07 DIAGNOSIS — R1011 Right upper quadrant pain: Secondary | ICD-10-CM

## 2021-09-07 DIAGNOSIS — R1310 Dysphagia, unspecified: Secondary | ICD-10-CM

## 2021-09-07 DIAGNOSIS — K219 Gastro-esophageal reflux disease without esophagitis: Secondary | ICD-10-CM | POA: Diagnosis not present

## 2021-09-07 NOTE — Patient Instructions (Signed)
Continue Protonix 40 mg daily for acid reflux.  Congratulations on your weight loss thus far!  Continue your efforts.  Instructions for fatty liver: Recommend 1-2# weight loss per week until ideal body weight through exercise & diet. Low fat/cholesterol diet.   Avoid sweets, sodas, fruit juices, sweetened beverages like tea, etc. Gradually increase exercise from 15 min daily up to 1 hr per day 5 days/week. Continue to limit alcohol use.  We will plan to see back in 1 year.  Do not hesitate to call if have any questions or concerns prior to your next visit.  It was great to see you again!  Enjoy the car show this weekend!   Aliene Altes, PA-C [facility] Gastroenterology

## 2021-09-14 ENCOUNTER — Other Ambulatory Visit: Payer: Self-pay | Admitting: *Deleted

## 2021-09-14 DIAGNOSIS — I6529 Occlusion and stenosis of unspecified carotid artery: Secondary | ICD-10-CM

## 2021-09-19 ENCOUNTER — Other Ambulatory Visit: Payer: Self-pay

## 2021-09-19 ENCOUNTER — Ambulatory Visit: Payer: Medicare Other | Admitting: Physician Assistant

## 2021-09-19 ENCOUNTER — Ambulatory Visit (HOSPITAL_COMMUNITY)
Admission: RE | Admit: 2021-09-19 | Discharge: 2021-09-19 | Disposition: A | Discharge status: Home / Self Care | Payer: Medicare Other | Source: Ambulatory Visit | Attending: Surgery | Admitting: Surgery

## 2021-09-19 VITALS — BP 122/71 | HR 74 | Temp 98.2°F | Resp 20 | Ht 72.0 in | Wt 214.7 lb

## 2021-09-19 DIAGNOSIS — I6529 Occlusion and stenosis of unspecified carotid artery: Secondary | ICD-10-CM | POA: Insufficient documentation

## 2021-09-19 NOTE — Progress Notes (Signed)
History of Present Illness:  Patient is a 70 y.o. year old male who presents for evaluation of carotid stenosis.  S/P B CEA L 2001 and R 2004  by Dr. Donnetta Hutching The patient denies symptoms of TIA, amaurosis, or stroke.    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   He is medically maintained on ASA and Crestor daily.  He denise symptoms of stroke/TIA.  No amaurosis, weakness in extremities or aphasia.      Past Medical History:  Diagnosis Date   Angina pectoris, unspecified ([place])    Arthritis    "wrists, thumb on left hand" (03/12/2018)   Back injury 2003   "broke my back in 2 places; never had any OR for it" (03/12/2018)   Carotid artery occlusion    Childhood asthma    COPD (chronic obstructive pulmonary disease) ([place])    Essential hypertension    Fatty liver    GERD (gastroesophageal reflux disease)    Goiter    Radioactive iodine treatment   H pylori ulcer 11/2014   Treated with Prevpac; documented eradication by biopsy in 2018   History of bronchitis    History of kidney stones    Hypercholesterolemia    Hypertension    Hypothyroid    Migraine    "been about 2 yr w/out a headache" (03/12/2018)   Neuropathy    Pneumonia 1969   Shortness of breath dyspnea    with exertion   Wears glasses     Past Surgical History:  Procedure Laterality Date   BIOPSY  05/01/2017   Procedure: BIOPSY;  Surgeon: Daneil Dolin, MD;  [facility];  Service: Endoscopy;;  gastric   BIOPSY  08/19/2018   Procedure: BIOPSY;  Surgeon: Daneil Dolin, MD;  [facility];  Service: Endoscopy;;  duodenal and gastric   CARDIAC CATHETERIZATION N/A 07/05/2016   Procedure: Left Heart Cath and Coronary Angiography;  Surgeon: Burnell Blanks, MD;  Location: [facility];  Service: Cardiovascular;  Laterality: N/A;   CARDIAC CATHETERIZATION  03/12/2018   CAROTID ENDARTERECTOMY Left ?2009   CARPAL TUNNEL RELEASE Right     CHOLECYSTECTOMY N/A 04/24/2018   Procedure: LAPAROSCOPIC CHOLECYSTECTOMY;  Surgeon: Aviva Signs, MD;  Location: [facility];  Service: General;  Laterality: N/A;   COLECTOMY  2000   Secondary to intussuception   COLONOSCOPY  08/24/2004   Normal rectum,Small polyp at 25 cm, cold snared/ The remainder of the colonic mucosa appeared normal   COLONOSCOPY  09/24/2012   Dr. Mike Craze hemorrhoids-likely source of hematochezia.Colonic diverticulosis   COLONOSCOPY N/A 05/01/2017   Dr. Gala Romney: Diverticulosis, next colonoscopy in 5 years.   COLONOSCOPY WITH PROPOFOL N/A 04/28/2019   Propofol; Dr. Gala Romney; moderate, medium sized grade 1 internal hemorrhoids, diverticulosis in sigmoid and descending colon, otherwise normal.  Repeat in 5 years.   ENDARTERECTOMY Right 07/07/2016   Procedure: ENDARTERECTOMY CAROTID;  Surgeon: Rosetta Posner, MD;  Location: [facility];  Service: Vascular;  Laterality: Right;   ESOPHAGOGASTRODUODENOSCOPY N/A 12/09/2014   Dr. Gala Romney: Erosive reflux esophagitis. Peptic Ulcer disease secondary to H.pylori. small hiatal hernia. Nonbleeding duodenal AVM. Duodenal erosions. TREATED WITH PREVPAC   ESOPHAGOGASTRODUODENOSCOPY N/A 03/15/2015   Dr. Gala Romney: small hiatal hernia, healed PUD, duodenal AVM.    ESOPHAGOGASTRODUODENOSCOPY N/A 05/01/2017   Dr. Gala Romney: LA grade a esophagitis, gastritis, no H pylori   ESOPHAGOGASTRODUODENOSCOPY (EGD) WITH PROPOFOL N/A 08/19/2018   PROPOFOL;  Surgeon:  Rourk, Cristopher Estimable, MD; normal esophagus, mild erythema and erosions in the entire stomach, small hiatal hernia, subtle white papular areas and duodenal bulb second and third portions of the duodenum.   FRACTURE SURGERY     LEFT HEART CATH AND CORONARY ANGIOGRAPHY N/A 03/12/2018   Procedure: LEFT HEART CATH AND CORONARY ANGIOGRAPHY;  Surgeon: Burnell Blanks, MD;  Location: [facility];  Service: Cardiovascular;  Laterality: N/A;   Multiple bilateral arm/wrist surgeries after falls     ORIF SHOULDER FRACTURE  Left 06/2002   "shattered; put a bunch of metal plates in it"   PATCH ANGIOPLASTY Right 07/07/2016   Procedure: PATCH ANGIOPLASTY USING 0.8CM X 7.6CM HEMASHIELD PATCH;  Surgeon: Rosetta Posner, MD;  [facility];  Service: Vascular;  Laterality: Right;   SHOULDER ARTHROSCOPY W/ ROTATOR CUFF REPAIR Right 1999   SHOULDER OPEN ROTATOR CUFF REPAIR Left 1992   Metal implant      Social History Social History   Tobacco Use   Smoking status: Former    Packs/day: 0.20    Years: 45.00    Pack years: 9.00    Types: Cigarettes    Quit date: 12/21/2017    Years since quitting: 3.7   Smokeless tobacco: Never  Vaping Use   Vaping Use: Former  Substance Use Topics   Alcohol use: Yes    Alcohol/week: 0.0 standard drinks    Comment: 1-2 beers in 6 months.    Drug use: No    Family History Family History  Problem Relation Age of Onset   COPD Mother    Mental illness Mother    Hypertension Mother    Varicose Veins Mother    Heart attack Mother 69   Hypertension Father    Alcohol abuse Father    Colon cancer Neg Hx     Allergies  Allergies  Allergen Reactions   Lyrica [Pregabalin] Shortness Of Breath   Celecoxib Other (See Comments)    GI BLEED, FATIGUE   Codeine Itching     Current Outpatient Medications  Medication Sig Dispense Refill   amLODipine (NORVASC) 10 MG tablet Take 1 tablet (10 mg total) by mouth daily. 90 tablet 3   aspirin EC 81 MG tablet Take 81 mg by mouth at bedtime.      buPROPion (WELLBUTRIN SR) 150 MG 12 hr tablet Take 150 mg by mouth 2 (two) times daily.     fluticasone (FLONASE) 50 MCG/ACT nasal spray Place 2 sprays into both nostrils daily as needed for allergies.      hydrochlorothiazide (HYDRODIURIL) 12.5 MG tablet Take 12.5 mg by mouth daily.     isosorbide mononitrate (IMDUR) 30 MG 24 hr tablet Take 1 tablet (30 mg total) by mouth daily. 90 tablet 3   levothyroxine (SYNTHROID) 175 MCG tablet Take 175 mcg by mouth daily.     losartan (COZAAR) 50 MG  tablet Take 1 tablet by mouth daily.     meloxicam (MOBIC) 7.5 MG tablet Take 7.5 mg by mouth daily.     nitroGLYCERIN (NITROSTAT) 0.4 MG SL tablet Place under the tongue.     pantoprazole (PROTONIX) 40 MG tablet Take 40 mg by mouth daily.     rosuvastatin (CRESTOR) 20 MG tablet Take 20 mg by mouth at bedtime.     metoprolol succinate (TOPROL XL) 25 MG 24 hr tablet Take 1 tablet (25 mg total) by mouth daily. 90 tablet 0   No current facility-administered medications for this visit.  ROS:   General:  No weight loss, Fever, chills  HEENT: No recent headaches, no nasal bleeding, no visual changes, no sore throat  Neurologic: No dizziness, blackouts, seizures. No recent symptoms of stroke or mini- stroke. No recent episodes of slurred speech, or temporary blindness.  Cardiac: No recent episodes of chest pain/pressure, no shortness of breath at rest.  No shortness of breath with exertion.  Denies history of atrial fibrillation or irregular heartbeat  Vascular: No history of rest pain in feet.  No history of claudication.  No history of non-healing ulcer, No history of DVT   Pulmonary: No home oxygen, no productive cough, no hemoptysis,  No asthma or wheezing  Musculoskeletal:  [ ]  Arthritis, [ ]  Low back pain,  [ x] Joint pain  Hematologic:No history of hypercoagulable state.  No history of easy bleeding.  No history of anemia  Gastrointestinal: No hematochezia or melena,  No gastroesophageal reflux, no trouble swallowing  Urinary: [ ]  chronic Kidney disease, [ ]  on HD - [ ]  MWF or [ ]  TTHS, [ ]  Burning with urination, [ ]  Frequent urination, [ ]  Difficulty urinating;   Skin: No rashes  Psychological: No history of anxiety,  No history of depression   Physical Examination  Vitals:   09/19/21 1412 09/19/21 1414  BP: 126/74 122/71  Pulse: 74   Resp: 20   Temp: 98.2 F (36.8 C)   TempSrc: Temporal   SpO2: 96%   Weight: 214 lb 11.2 oz (97.4 kg)   Height: 6' (1.829 m)      Body mass index is 29.12 kg/m.  General:  Alert and oriented, no acute distress HEENT: Normal Neck: Positive bruit verses murmur Pulmonary: Clear to auscultation bilaterally Cardiac: Regular Rate and Rhythm without murmur Gastrointestinal: Soft, non-tender, non-distended, no mass, no scars Skin: No rash Extremity Pulses:  2+ radial, brachial Musculoskeletal: No deformity or edema  Neurologic: Upper and lower extremity motor 5/5 and symmetric  DATA:      Right Carotid Findings:  +----------+--------+--------+--------+------------------+--------+            PSV cm/sEDV cm/sStenosisPlaque DescriptionComments  +----------+--------+--------+--------+------------------+--------+  CCA Prox  79      14                                          +----------+--------+--------+--------+------------------+--------+  CCA Mid   103     21                                          +----------+--------+--------+--------+------------------+--------+  CCA Distal126     24              homogeneous                 +----------+--------+--------+--------+------------------+--------+  ICA Prox  102     17      1-39%   heterogenous                +----------+--------+--------+--------+------------------+--------+  ICA Mid   78      23                                          +----------+--------+--------+--------+------------------+--------+  ICA  Distal75      24                                          +----------+--------+--------+--------+------------------+--------+  ECA       132     15                                          +----------+--------+--------+--------+------------------+--------+   +----------+--------+-------+----------------+-------------------+            PSV cm/sEDV cmsDescribe        Arm Pressure (mmHG)  +----------+--------+-------+----------------+-------------------+  [id number]            Multiphasic,  WNL                     +----------+--------+-------+----------------+-------------------+   +---------+--------+--+--------+--+---------+  VertebralPSV cm/s56EDV cm/s17Antegrade  +---------+--------+--+--------+--+---------+       Left Carotid Findings:  +----------+--------+--------+--------+------------------+--------+            PSV cm/sEDV cm/sStenosisPlaque DescriptionComments  +----------+--------+--------+--------+------------------+--------+  CCA Prox  180     33                                          +----------+--------+--------+--------+------------------+--------+  CCA Mid   189     32              heterogenous                +----------+--------+--------+--------+------------------+--------+  CCA Distal227     45              heterogenous                +----------+--------+--------+--------+------------------+--------+  ICA Prox  114     20      1-39%   heterogenous                +----------+--------+--------+--------+------------------+--------+  ICA Mid   80      25                                          +----------+--------+--------+--------+------------------+--------+  ICA Distal71      24                                          +----------+--------+--------+--------+------------------+--------+  ECA       74      11                                          +----------+--------+--------+--------+------------------+--------+   +----------+--------+--------+----------------+-------------------+            PSV cm/sEDV cm/sDescribe        Arm Pressure (mmHG)  +----------+--------+--------+----------------+-------------------+  [id number]             Multiphasic, WNL                     +----------+--------+--------+----------------+-------------------+   +---------+--------+--+--------+--+---------+  VertebralPSV cm/s42EDV cm/s10Antegrade   +---------+--------+--+--------+--+---------+   Summary:  Right Carotid: Velocities in the right ICA are consistent with a 1-39%  stenosis.   Left Carotid: Velocities in the left ICA are consistent with a 1-39%  stenosis.   Vertebrals:  Bilateral vertebral arteries demonstrate antegrade flow.  Subclavians: Normal flow hemodynamics were seen in bilateral subclavian               arteries  ASSESSMENT:  History asymptomatic carotid stenosis s/p B CEA left 2001 and right 2004 by Dr. Donnetta Hutching. He remain asymptomatic for stroke/TIA.  His carotid duplex shows B ICA, 39% stenosis.    PLAN: F/u in 1 year for continued surveillance.  If he has symptoms of stroke/TIA he will call sooner.  Stay active and continue smoking cessation.    Roxy Horseman PA-C [facility] Office: [phone]  MD in clinic [place]

## 2021-11-17 ENCOUNTER — Other Ambulatory Visit: Payer: Self-pay | Admitting: *Deleted

## 2021-11-17 MED ORDER — AMLODIPINE BESYLATE 10 MG PO TABS: 10.0000 mg | ORAL_TABLET | Freq: Every day | ORAL | 0 refills | Status: DC

## 2021-12-12 DIAGNOSIS — I700 Atherosclerosis of aorta: Secondary | ICD-10-CM | POA: Diagnosis not present

## 2021-12-12 DIAGNOSIS — E039 Hypothyroidism, unspecified: Secondary | ICD-10-CM | POA: Diagnosis not present

## 2021-12-12 DIAGNOSIS — I779 Disorder of arteries and arterioles, unspecified: Secondary | ICD-10-CM | POA: Diagnosis not present

## 2021-12-12 DIAGNOSIS — Z299 Encounter for prophylactic measures, unspecified: Secondary | ICD-10-CM | POA: Diagnosis not present

## 2021-12-12 DIAGNOSIS — I10 Essential (primary) hypertension: Secondary | ICD-10-CM | POA: Diagnosis not present

## 2022-02-13 ENCOUNTER — Other Ambulatory Visit: Payer: Self-pay | Admitting: Cardiology

## 2022-02-13 ENCOUNTER — Encounter: Payer: Self-pay | Admitting: Cardiology

## 2022-02-13 DIAGNOSIS — I10 Essential (primary) hypertension: Secondary | ICD-10-CM | POA: Diagnosis not present

## 2022-02-13 DIAGNOSIS — Z79899 Other long term (current) drug therapy: Secondary | ICD-10-CM | POA: Diagnosis not present

## 2022-02-13 DIAGNOSIS — Z299 Encounter for prophylactic measures, unspecified: Secondary | ICD-10-CM | POA: Diagnosis not present

## 2022-02-13 DIAGNOSIS — R5383 Other fatigue: Secondary | ICD-10-CM | POA: Diagnosis not present

## 2022-02-13 DIAGNOSIS — Z0000 Encounter for general adult medical examination without abnormal findings: Secondary | ICD-10-CM | POA: Diagnosis not present

## 2022-02-13 DIAGNOSIS — Z7189 Other specified counseling: Secondary | ICD-10-CM | POA: Diagnosis not present

## 2022-02-13 DIAGNOSIS — E7800 Pure hypercholesterolemia, unspecified: Secondary | ICD-10-CM | POA: Diagnosis not present

## 2022-02-22 ENCOUNTER — Telehealth: Payer: Self-pay | Admitting: *Deleted

## 2022-02-22 ENCOUNTER — Encounter: Payer: Self-pay | Admitting: Cardiology

## 2022-02-22 ENCOUNTER — Encounter: Payer: Self-pay | Admitting: *Deleted

## 2022-02-22 NOTE — Telephone Encounter (Signed)
Contacted to verify if appointment needed since he has established with Dr. Candis Musa at [facility] last seen 07/2021. Per wife, [name], patient wishes to stay with [facility] and will no longer be seeing Dr. Candis Musa.  ?

## 2022-02-22 NOTE — Progress Notes (Signed)
? ? ?Cardiology Office Note ? ?Date: 02/23/2022  ? ?ID: Seth Savage, DOB Oct 28, 1951, MRN [id number] ? ?PCP:  Glenda Chroman, MD  ?Cardiologist:  Rozann Lesches, MD ?Electrophysiologist:  None  ? ?Chief Complaint  ?Patient presents with  ? Cardiac follow-up  ? ? ?History of Present Illness: ?Seth Savage is a 71 y.o. male former patient of Dr. Bronson Ing and more recently Dr. Candis Musa with [facility] cardiology now presenting to establish follow-up with me.  I reviewed extensive records and updated the chart. ? ?He is here for a routine visit.  He does not report any angina or nitroglycerin use on current regimen.  Reports NYHA class II dyspnea, no palpitations or syncope.  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 ? ?I reviewed his medications which are noted below.  He reports compliance with therapy.  Plan to request most recent lipid panel from Dr. Woody Seller. ? ?He did have carotid Dopplers in October 2022 as noted below, status post bilateral CEA. ? ? ?Past Medical History:  ?Diagnosis Date  ? Arthritis   ? Back injury   ? CAD (coronary artery disease)   ? 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 Carotid artery disease ([place])   ? Left CEA 2001 and right CEA 2004  ? Childhood asthma   ? COPD (chronic obstructive pulmonary disease) ([place])   ? Essential hypertension   ? Fatty liver   ? GERD (gastroesophageal reflux disease)   ? Goiter   ? Radioactive iodine treatment  ? H pylori ulcer 11/2014  ? Treated with Prevpac; documented eradication by biopsy in 2018  ? History of bronchitis   ? History of kidney stones   ? History of pneumonia as a child   ? Hypothyroidism   ? Migraine   ? Mixed hyperlipidemia   ? Neuropathy   ? ? ?Past Surgical History:  ?Procedure Laterality Date  ? BIOPSY  05/01/2017  ? Procedure: BIOPSY;  Surgeon: Daneil Dolin, MD;  [facility];  Service: Endoscopy;;  gastric  ? BIOPSY  08/19/2018  ? Procedure: BIOPSY;  Surgeon: Daneil Dolin, MD;   [facility];  Service: Endoscopy;;  duodenal and gastric  ? CARDIAC CATHETERIZATION N/A 07/05/2016  ? Procedure: Left Heart Cath and Coronary Angiography;  Surgeon: Burnell Blanks, MD;  Location: [place];  Service: Cardiovascular;  Laterality: N/A;  ? CARDIAC CATHETERIZATION  03/12/2018  ? CAROTID ENDARTERECTOMY Left ?2009  ? CARPAL TUNNEL RELEASE Right   ? CHOLECYSTECTOMY N/A 04/24/2018  ? Procedure: LAPAROSCOPIC CHOLECYSTECTOMY;  Surgeon: Aviva Signs, MD;  Location: [facility];  Service: General;  Laterality: N/A;  ? COLECTOMY  2000  ? Secondary to intussuception  ? COLONOSCOPY  08/24/2004  ? Normal rectum,Small polyp at 25 cm, cold snared/ The remainder of the colonic mucosa appeared normal  ? COLONOSCOPY  09/24/2012  ? Dr. Mike Craze hemorrhoids-likely source of hematochezia.Colonic diverticulosis  ? COLONOSCOPY N/A 05/01/2017  ? Dr. Gala Romney: Diverticulosis, next colonoscopy in 5 years.  ? COLONOSCOPY WITH PROPOFOL N/A 04/28/2019  ? Propofol; Dr. Gala Romney; moderate, medium sized grade 1 internal hemorrhoids, diverticulosis in sigmoid and descending colon, otherwise normal.  Repeat in 5 years.  ? ENDARTERECTOMY Right 07/07/2016  ? Procedure: ENDARTERECTOMY CAROTID;  Surgeon: Rosetta Posner, MD;  Location: [facility];  Service: Vascular;  Laterality: Right;  ? ESOPHAGOGASTRODUODENOSCOPY N/A 12/09/2014  ? Dr. Gala Romney: Erosive reflux esophagitis. Peptic Ulcer disease secondary to H.pylori. small hiatal hernia. Nonbleeding  duodenal AVM. Duodenal erosions. TREATED WITH PREVPAC  ? ESOPHAGOGASTRODUODENOSCOPY N/A 03/15/2015  ? Dr. Gala Romney: small hiatal hernia, healed PUD, duodenal AVM.   ? ESOPHAGOGASTRODUODENOSCOPY N/A 05/01/2017  ? Dr. Gala Romney: LA grade a esophagitis, gastritis, no H pylori  ? ESOPHAGOGASTRODUODENOSCOPY (EGD) WITH PROPOFOL N/A 08/19/2018  ? PROPOFOL;  Surgeon: Daneil Dolin, MD; normal esophagus, mild erythema and erosions in the entire stomach, small hiatal hernia, subtle white papular areas and  duodenal bulb second and third portions of the duodenum.  ? FRACTURE SURGERY    ? LEFT HEART CATH AND CORONARY ANGIOGRAPHY N/A 03/12/2018  ? Procedure: LEFT HEART CATH AND CORONARY ANGIOGRAPHY;  Surgeon: Burnell Blanks, MD;  Location: [facility];  Service: Cardiovascular;  Laterality: N/A;  ? Multiple bilateral arm/wrist surgeries after falls    ? ORIF SHOULDER FRACTURE Left 06/2002  ? "shattered; put a bunch of metal plates in it"  ? PATCH ANGIOPLASTY Right 07/07/2016  ? Procedure: PATCH ANGIOPLASTY USING 0.8CM X 7.6CM HEMASHIELD PATCH;  Surgeon: Rosetta Posner, MD;  Location: [facility];  Service: Vascular;  Laterality: Right;  ? SHOULDER ARTHROSCOPY W/ ROTATOR CUFF REPAIR Right 1999  ? SHOULDER OPEN ROTATOR CUFF REPAIR Left 1992  ? Metal implant   ? ? ?Current Outpatient Medications  ?Medication Sig Dispense Refill  ? amLODipine (NORVASC) 10 MG tablet TAKE 1 TABLET BY MOUTH EVERY DAY 90 tablet 0  ? aspirin EC 81 MG tablet Take 81 mg by mouth at bedtime.     ? buPROPion (WELLBUTRIN SR) 150 MG 12 hr tablet Take 150 mg by mouth 2 (two) times daily.    ? fluticasone (FLONASE) 50 MCG/ACT nasal spray Place 2 sprays into both nostrils daily as needed for allergies.     ? hydrochlorothiazide (HYDRODIURIL) 12.5 MG tablet Take 12.5 mg by mouth daily.    ? isosorbide mononitrate (IMDUR) 30 MG 24 hr tablet Take 1 tablet (30 mg total) by mouth daily. 90 tablet 3  ? levothyroxine (SYNTHROID) 175 MCG tablet Take 175 mcg by mouth daily.    ? losartan (COZAAR) 50 MG tablet Take 1 tablet by mouth daily.    ? metoprolol succinate (TOPROL XL) 25 MG 24 hr tablet Take 1 tablet (25 mg total) by mouth daily. 90 tablet 0  ? nitroGLYCERIN (NITROSTAT) 0.4 MG SL tablet Place under the tongue.    ? pantoprazole (PROTONIX) 40 MG tablet Take 40 mg by mouth daily.    ? rosuvastatin (CRESTOR) 20 MG tablet Take 20 mg by mouth at bedtime.    ? ?No current facility-administered medications for this visit.  ? ?Allergies:  Lyrica  [pregabalin], Celecoxib, and Codeine  ? ?ROS: No orthopnea or PND.  No syncope. ? ?Physical Exam: ?VS:  BP 122/64   Pulse 61   Ht 6' (1.829 m)   Wt 225 lb 6.4 oz (102.2 kg)   SpO2 96%   BMI 30.57 kg/m? , BMI Body mass index is 30.57 kg/m?. ? ?Wt Readings from Last 3 Encounters:  ?02/23/22 225 lb 6.4 oz (102.2 kg)  ?09/19/21 214 lb 11.2 oz (97.4 kg)  ?09/07/21 214 lb 3.2 oz (97.2 kg)  ?  ?General: Patient appears comfortable at rest. ?HEENT: Conjunctiva and lids normal, oropharynx clear. ?Neck: Supple, no elevated JVP, bilateral carotid bruits. ?Lungs: Clear to auscultation, nonlabored breathing at rest. ?Cardiac: Regular rate and rhythm, no S3 or significant systolic murmur, no pericardial rub. ?Abdomen: Soft, nontender, bowel sounds present. ?Extremities: No pitting edema, distal pulses 2+. ?Skin:  Warm and dry. ?Musculoskeletal: No kyphosis. ?Neuropsychiatric: Alert and oriented x3, affect grossly appropriate. ? ?ECG:  An ECG dated 01/22/2021 was personally reviewed today and demonstrated:  Sinus rhythm with left bundle branch block. ? ?Recent Labwork: ?   ?Component Value Date/Time  ? CHOL 119 03/13/2018 0256  ? TRIG 213 (H) 03/13/2018 0256  ? HDL 29 (L) 03/13/2018 0256  ? CHOLHDL 4.1 03/13/2018 0256  ? VLDL 43 (H) 03/13/2018 0256  ? LDLCALC 47 03/13/2018 0256  ?March 2022: Hemoglobin 14.1, platelets 214, potassium 4.0, BUN 23, creatinine 1.26, AST 16, ALT 24 ? ?Other Studies Reviewed Today: ? ?Cardiac catheterization 03/12/2018: ?Prox RCA lesion is 30% stenosed. ?Post Atrio lesion is 40% stenosed. ?Ost RPDA lesion is 20% stenosed. ?Ost 1st Mrg lesion is 30% stenosed. ?Ost 2nd Mrg lesion is 30% stenosed. ?Ost Cx to Prox Cx lesion is 20% stenosed. ?Ost LAD to Prox LAD lesion is 30% stenosed. ?The left ventricular systolic function is normal. ?LV end diastolic pressure is normal. ?The left ventricular ejection fraction is 55-65% by visual estimate. ?There is no mitral valve regurgitation. ?  ?1. Mild  non-obstructive CAD ?2. Normal LV systolic function ? ?Echocardiogram 06/27/2018: ?- Left ventricle: The cavity size was normal. Wall thickness was  ?  normal. Systolic function was normal. The estimated ejection  ?  fraction was in the r

## 2022-02-23 ENCOUNTER — Encounter: Payer: Self-pay | Admitting: *Deleted

## 2022-02-23 ENCOUNTER — Ambulatory Visit: Payer: Medicare Other | Admitting: Cardiology

## 2022-02-23 ENCOUNTER — Encounter: Payer: Self-pay | Admitting: Cardiology

## 2022-02-23 VITALS — BP 122/64 | HR 61 | Ht 72.0 in | Wt 225.4 lb

## 2022-02-23 DIAGNOSIS — I25119 Atherosclerotic heart disease of native coronary artery with unspecified angina pectoris: Secondary | ICD-10-CM | POA: Diagnosis not present

## 2022-02-23 DIAGNOSIS — I6523 Occlusion and stenosis of bilateral carotid arteries: Secondary | ICD-10-CM | POA: Diagnosis not present

## 2022-02-23 DIAGNOSIS — E782 Mixed hyperlipidemia: Secondary | ICD-10-CM

## 2022-02-23 DIAGNOSIS — I10 Essential (primary) hypertension: Secondary | ICD-10-CM | POA: Diagnosis not present

## 2022-02-23 NOTE — Patient Instructions (Signed)
Medication Instructions:  ?Your physician recommends that you continue on your current medications as directed. Please refer to the Current Medication list given to you today.  ? ?Labwork: ?none ? ?Testing/Procedures: ?Your physician has requested that you have a carotid duplex in October 2023. This test is an ultrasound of the carotid arteries in your neck. It looks at blood flow through these arteries that supply the brain with blood. Allow one hour for this exam. There are no restrictions or special instructions. ? ?Follow-Up: ?Your physician recommends that you schedule a follow-up appointment in: 1 year. You will receive a reminder call in the mail in about 10 months reminding you to call and schedule your appointment. If you don't receive this call, please contact our office. ? ?Any Other Special Instructions Will Be Listed Below (If Applicable). ? ?If you need a refill on your cardiac medications before your next appointment, please call your pharmacy. ?

## 2022-04-08 ENCOUNTER — Emergency Department (HOSPITAL_COMMUNITY): Payer: Medicare Other

## 2022-04-08 ENCOUNTER — Other Ambulatory Visit: Payer: Self-pay

## 2022-04-08 ENCOUNTER — Emergency Department (HOSPITAL_COMMUNITY)
Admission: EM | Admit: 2022-04-08 | Discharge: 2022-04-08 | Disposition: A | Discharge status: Home / Self Care | Payer: Medicare Other | Attending: Emergency Medicine | Admitting: Emergency Medicine

## 2022-04-08 ENCOUNTER — Encounter (HOSPITAL_COMMUNITY): Payer: Self-pay

## 2022-04-08 DIAGNOSIS — J449 Chronic obstructive pulmonary disease, unspecified: Secondary | ICD-10-CM | POA: Insufficient documentation

## 2022-04-08 DIAGNOSIS — Z7982 Long term (current) use of aspirin: Secondary | ICD-10-CM | POA: Insufficient documentation

## 2022-04-08 DIAGNOSIS — Z79899 Other long term (current) drug therapy: Secondary | ICD-10-CM | POA: Insufficient documentation

## 2022-04-08 DIAGNOSIS — R0602 Shortness of breath: Secondary | ICD-10-CM | POA: Diagnosis not present

## 2022-04-08 DIAGNOSIS — J019 Acute sinusitis, unspecified: Secondary | ICD-10-CM | POA: Diagnosis not present

## 2022-04-08 MED ORDER — BENZONATATE 200 MG PO CAPS: 200.0000 mg | ORAL_CAPSULE | Freq: Three times a day (TID) | ORAL | 0 refills | Status: DC | PRN

## 2022-04-08 MED ORDER — PREDNISONE 10 MG PO TABS
60.0000 mg | ORAL_TABLET | Freq: Once | ORAL | Status: AC
  Administered 2022-04-08: 60 mg via ORAL
  Filled 2022-04-08: qty 1

## 2022-04-08 MED ORDER — PREDNISONE 20 MG PO TABS: 40.0000 mg | ORAL_TABLET | Freq: Every day | ORAL | 0 refills | Status: DC

## 2022-04-08 MED ORDER — AMOXICILLIN-POT CLAVULANATE 875-125 MG PO TABS: 1.0000 | ORAL_TABLET | Freq: Two times a day (BID) | ORAL | 0 refills | Status: DC

## 2022-04-08 MED ORDER — ALBUTEROL SULFATE HFA 108 (90 BASE) MCG/ACT IN AERS
2.0000 | INHALATION_SPRAY | RESPIRATORY_TRACT | Status: DC | PRN
  Administered 2022-04-08: 2 via RESPIRATORY_TRACT
  Filled 2022-04-08: qty 6.7

## 2022-04-08 MED ORDER — AMOXICILLIN-POT CLAVULANATE 875-125 MG PO TABS
1.0000 | ORAL_TABLET | Freq: Once | ORAL | Status: AC
Start: 2022-04-08 — End: 2022-04-08
  Administered 2022-04-08: 1 via ORAL
  Filled 2022-04-08: qty 1

## 2022-04-08 NOTE — ED Provider Notes (Signed)
[facility] EMERGENCY DEPARTMENT Provider Note   CSN: [id number] Arrival date & time: 04/08/22  1914     History  Chief Complaint  Patient presents with   Shortness of Breath   Cough    Seth Savage is a 71 y.o. male.  Patient presents with complaints of cough.  Patient reports that his cough developed yesterday and worsened throughout the day.  It is a wet cough and it is causing him to feel short of breath.  He reports that it is worse when he lies down.  Wife says it sounds like he is "gurgling" when he lays down.  He has had sinus pressure, drainage and is coughing up mucus.      Home Medications Prior to Admission medications   Medication Sig Start Date End Date Taking? Authorizing Provider  amoxicillin-clavulanate (AUGMENTIN) 875-125 MG tablet Take 1 tablet by mouth every 12 (twelve) hours. 04/08/22  Yes Gage Weant, Gwenyth Allegra, MD  benzonatate (TESSALON) 200 MG capsule Take 1 capsule (200 mg total) by mouth 3 (three) times daily as needed for cough. 04/08/22  Yes Arther Heisler, Gwenyth Allegra, MD  predniSONE (DELTASONE) 20 MG tablet Take 2 tablets (40 mg total) by mouth daily with breakfast. 04/08/22  Yes Osmany Azer, Gwenyth Allegra, MD  amLODipine (NORVASC) 10 MG tablet TAKE 1 TABLET BY MOUTH EVERY DAY 02/13/22   Satira Sark, MD  aspirin EC 81 MG tablet Take 81 mg by mouth at bedtime.     [provider]  buPROPion (WELLBUTRIN SR) 150 MG 12 hr tablet Take 150 mg by mouth 2 (two) times daily.    [provider]  fluticasone (FLONASE) 50 MCG/ACT nasal spray Place 2 sprays into both nostrils daily as needed for allergies.     [provider]  hydrochlorothiazide (HYDRODIURIL) 12.5 MG tablet Take 12.5 mg by mouth daily. 11/29/18   [provider]  isosorbide mononitrate (IMDUR) 30 MG 24 hr tablet Take 1 tablet (30 mg total) by mouth daily. 11/16/20 02/24/23  Verta Ellen., NP  levothyroxine (SYNTHROID) 175 MCG tablet Take 175 mcg by mouth daily.  01/14/21   [provider]  losartan (COZAAR) 50 MG tablet Take 1 tablet by mouth daily. 08/09/21   [provider]  metoprolol succinate (TOPROL XL) 25 MG 24 hr tablet Take 1 tablet (25 mg total) by mouth daily. 04/25/18 02/24/23  Aviva Signs, MD  nitroGLYCERIN (NITROSTAT) 0.4 MG SL tablet Place under the tongue. 01/31/21   [provider]  pantoprazole (PROTONIX) 40 MG tablet Take 40 mg by mouth daily.    [provider]  rosuvastatin (CRESTOR) 20 MG tablet Take 20 mg by mouth at bedtime. 01/31/21   [provider]  dicyclomine (BENTYL) 20 MG tablet Take 1 tablet (20 mg total) by mouth 2 (two) times daily. 06/07/16 06/07/16  Comer Locket, PA-C      Allergies    Lyrica [pregabalin], Celecoxib, and Codeine    Review of Systems   Review of Systems  Physical Exam Updated Vital Signs BP (!) 159/69 (BP Location: Right Arm)   Pulse 69   Temp 98.5 F (36.9 C) (Oral)   Resp 18   Ht 6' (1.829 m)   Wt 102.1 kg   SpO2 90%   BMI 30.52 kg/m  Physical Exam Vitals and nursing note reviewed.  Constitutional:      General: He is not in acute distress.    Appearance: He is well-developed.  HENT:     Head:  Normocephalic and atraumatic.     Mouth/Throat:     Mouth: Mucous membranes are moist.  Eyes:     General: Vision grossly intact. Gaze aligned appropriately.     Extraocular Movements: Extraocular movements intact.     Conjunctiva/sclera: Conjunctivae normal.  Cardiovascular:     Rate and Rhythm: Normal rate and regular rhythm.     Pulses: Normal pulses.     Heart sounds: Normal heart sounds, S1 normal and S2 normal. No murmur heard.   No friction rub. No gallop.  Pulmonary:     Effort: Pulmonary effort is normal. No respiratory distress.     Breath sounds: Normal breath sounds.  Abdominal:     Palpations: Abdomen is soft.     Tenderness: There is no abdominal tenderness. There is no guarding or rebound.     Hernia: No hernia is present.   Musculoskeletal:        General: No swelling.     Cervical back: Full passive range of motion without pain, normal range of motion and neck supple. No pain with movement, spinous process tenderness or muscular tenderness. Normal range of motion.     Right lower leg: No edema.     Left lower leg: No edema.  Skin:    General: Skin is warm and dry.     Capillary Refill: Capillary refill takes less than 2 seconds.     Findings: No ecchymosis, erythema, lesion or wound.  Neurological:     Mental Status: He is alert and oriented to person, place, and time.     GCS: GCS eye subscore is 4. GCS verbal subscore is 5. GCS motor subscore is 6.     Cranial Nerves: Cranial nerves 2-12 are intact.     Sensory: Sensation is intact.     Motor: Motor function is intact. No weakness or abnormal muscle tone.     Coordination: Coordination is intact.  Psychiatric:        Mood and Affect: Mood normal.        Speech: Speech normal.        Behavior: Behavior normal.    ED Results / Procedures / Treatments   Labs (all labs ordered are listed, but only abnormal results are displayed) Labs Reviewed - No data to display  EKG None  Radiology DG Chest 2 View  Result Date: 04/08/2022 CLINICAL DATA:  Shortness of breath. EXAM: CHEST - 2 VIEW COMPARISON:  Chest radiograph dated 01/22/2021. FINDINGS: The heart size and mediastinal contours are within normal limits. There is minimal left basilar atelectasis/scarring. The right lung is clear. There is no pleural effusion or pneumothorax. Degenerative changes are seen in the spine. IMPRESSION: Minimal left basilar atelectasis/scarring. Electronically Signed   By: Zerita Boers M.D.   On: 04/08/2022 20:24    Procedures Procedures    Medications Ordered in ED Medications  predniSONE (DELTASONE) tablet 60 mg (has no administration in time range)  albuterol (VENTOLIN HFA) 108 (90 Base) MCG/ACT inhaler 2 puff (has no administration in time range)   amoxicillin-clavulanate (AUGMENTIN) 875-125 MG per tablet 1 tablet (has no administration in time range)    ED Course/ Medical Decision Making/ A&P                           Medical Decision Making Problems Addressed: Acute sinusitis, recurrence not specified, unspecified location: acute illness or injury  Amount and/or Complexity of Data Reviewed Independent Historian: spouse Radiology: ordered and  independent interpretation performed. Decision-making details documented in ED Course.   Presents with upper respiratory symptoms that have been present for 1 day.  Differential diagnosis includes viral URI, bronchitis, bacterial pneumonia, sinusitis.  Patient does have sinus congestion, pressure and some postnasal drip.  He is coughing up mucus.  Auscultation, however, reveals completely normal lung sounds.  No bronchospasm.  Oxygen saturation is adequate.  No signs of congestive heart failure on exam, symptoms are more infectious.  Chest x-ray shows no evidence of pneumonia.  He does have COPD listed has a prior history.  We will treat with prednisone, antibiotics, albuterol, given return precautions.        Final Clinical Impression(s) / ED Diagnoses Final diagnoses:  Acute sinusitis, recurrence not specified, unspecified location    Rx / DC Orders ED Discharge Orders          Ordered    amoxicillin-clavulanate (AUGMENTIN) 875-125 MG tablet  Every 12 hours        04/08/22 2305    benzonatate (TESSALON) 200 MG capsule  3 times daily PRN        04/08/22 2305    predniSONE (DELTASONE) 20 MG tablet  Daily with breakfast        04/08/22 2305              Orpah Greek, MD 04/08/22 2306

## 2022-04-08 NOTE — ED Notes (Signed)
Patient verbalizes understanding of discharge instructions. Opportunity for questioning and answers were provided. Armband removed by staff, pt discharged from ED. Ambulated out to lobby  

## 2022-04-08 NOTE — ED Triage Notes (Signed)
Pt states he has had a wet cough since yesterday, feels like he has "gurgling" in his chest. Pt thinks he may have pneumonia. Pt c/o SOB, especially when laying down

## 2022-05-15 ENCOUNTER — Other Ambulatory Visit: Payer: Self-pay | Admitting: Cardiology

## 2022-05-29 DIAGNOSIS — I10 Essential (primary) hypertension: Secondary | ICD-10-CM | POA: Diagnosis not present

## 2022-05-29 DIAGNOSIS — I700 Atherosclerosis of aorta: Secondary | ICD-10-CM | POA: Diagnosis not present

## 2022-05-29 DIAGNOSIS — Z0000 Encounter for general adult medical examination without abnormal findings: Secondary | ICD-10-CM | POA: Diagnosis not present

## 2022-05-29 DIAGNOSIS — E039 Hypothyroidism, unspecified: Secondary | ICD-10-CM | POA: Diagnosis not present

## 2022-05-29 DIAGNOSIS — Z299 Encounter for prophylactic measures, unspecified: Secondary | ICD-10-CM | POA: Diagnosis not present

## 2022-08-07 ENCOUNTER — Encounter: Payer: Self-pay | Admitting: *Deleted

## 2022-08-26 ENCOUNTER — Ambulatory Visit
Admission: EM | Admit: 2022-08-26 | Discharge: 2022-08-26 | Disposition: A | Discharge status: Home / Self Care | Payer: Medicare Other | Attending: Family Medicine | Admitting: Family Medicine

## 2022-08-26 DIAGNOSIS — G5702 Lesion of sciatic nerve, left lower limb: Secondary | ICD-10-CM

## 2022-08-26 MED ORDER — METHYLPREDNISOLONE SODIUM SUCC 125 MG IJ SOLR
60.0000 mg | Freq: Once | INTRAMUSCULAR | Status: AC
  Administered 2022-08-26: 60 mg via INTRAMUSCULAR

## 2022-08-26 MED ORDER — TIZANIDINE HCL 4 MG PO CAPS: 4.0000 mg | ORAL_CAPSULE | Freq: Three times a day (TID) | ORAL | 0 refills | Status: DC | PRN

## 2022-08-26 NOTE — ED Triage Notes (Signed)
Pt states that he has some lower back pain that radiates down his leg. 1 week

## 2022-08-26 NOTE — ED Provider Notes (Signed)
[facility]    CSN: [id number] Arrival date & time: 08/26/22  1030      History   Chief Complaint Chief Complaint  Patient presents with   Back Pain    Lower back pain x1 week    HPI Seth Savage is a 71 y.o. male.   Patient presenting today with 1 week history of left-sided lower back pain rating down to buttock and the back of his left leg.  States pain started shortly after he was lifting a trailer off of a trailer hitch and moving into the side.  Denies weakness numbness tingling or swelling of the leg, bowel or bladder incontinence, saddle anesthesias, fever, chills.  So far trying stretches with minimal relief.    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   Carotid artery disease ([place])    Left CEA 2001 and right CEA 2004   Childhood asthma    COPD (chronic obstructive pulmonary disease) ([place])    Essential hypertension    Fatty liver    GERD (gastroesophageal reflux disease)    Goiter    Radioactive iodine treatment   H pylori ulcer 11/2014   Treated with Prevpac; documented eradication by biopsy in 2018   History of bronchitis    History of kidney stones    History of pneumonia as a child    Hypothyroidism    Migraine    Mixed hyperlipidemia    Neuropathy     Patient Active Problem List   Diagnosis Date Noted   Dysphagia 03/07/2021   Fatty liver 09/03/2020   Loose stools 09/03/2020   Heme positive stool 05/13/2018   Abdominal pain, epigastric 05/13/2018   Acute URI 05/02/2018   Adult body mass index 28.0-28.9 05/02/2018   Allergic rhinitis 05/02/2018   Elevated PSA 05/02/2018   Screening for prostate cancer 05/02/2018   Fatigue 05/02/2018   Former smoker 05/02/2018   Gastroesophageal reflux disease 05/02/2018   History of hay fever 05/02/2018   History of migraine headaches 05/02/2018   History of  hypothyroidism 05/02/2018   History of renal calculi 05/02/2018   Hypercholesterolemia 05/02/2018   Hypothyroid 05/02/2018   Kidney stone 05/02/2018   Migraine 05/02/2018   Screening for depression 62/70/3500   Biliary colic 93/81/8299   Ventricular tachyarrhythmia ([place]) 04/24/2018   Gallbladder sludge    VT (ventricular tachycardia) ([place])    Coronary artery disease due to lipid rich plaque    S/P laparoscopic cholecystectomy    Unstable angina ([place]) 03/12/2018   History of colonic polyps 03/23/2017   RUQ pain 02/07/2017   Asymptomatic carotid artery stenosis 07/07/2016   Abnormal stress test    Coronary artery disease involving native coronary artery of native heart without angina pectoris    Tobacco use disorder 03/23/2015   Essential hypertension 03/23/2015   Chest pain 03/22/2015   History of peptic ulcer disease    PUD (peptic ulcer disease) 02/25/2015   Acute gastric ulcer    Reflux esophagitis    Headache(784.0) 09/03/2013   Occlusion and stenosis of carotid artery without mention of cerebral infarction 12/12/2012   LUQ pain 09/23/2012   Hematochezia 09/23/2012    Past Surgical History:  Procedure Laterality Date   BIOPSY  05/01/2017   Procedure: BIOPSY;  Surgeon: Daneil Dolin, MD;  [facility]: [facility];  Service: Endoscopy;;  gastric  BIOPSY  08/19/2018   Procedure: BIOPSY;  Surgeon: Daneil Dolin, MD;  Location: [facility];  Service: Endoscopy;;  duodenal and gastric   CARDIAC CATHETERIZATION N/A 07/05/2016   Procedure: Left Heart Cath and Coronary Angiography;  Surgeon: Burnell Blanks, MD;  Location: [place] [facility];  Service: Cardiovascular;  Laterality: N/A;   CARDIAC CATHETERIZATION  03/12/2018   CAROTID ENDARTERECTOMY Left ?2009   CARPAL TUNNEL RELEASE Right    CHOLECYSTECTOMY N/A 04/24/2018   Procedure: LAPAROSCOPIC CHOLECYSTECTOMY;  Surgeon: Aviva Signs, MD;  Location: [facility];  Service: General;  Laterality: N/A;   COLECTOMY  2000    Secondary to intussuception   COLONOSCOPY  08/24/2004   Normal rectum,Small polyp at 25 cm, cold snared/ The remainder of the colonic mucosa appeared normal   COLONOSCOPY  09/24/2012   Dr. Mike Craze hemorrhoids-likely source of hematochezia.Colonic diverticulosis   COLONOSCOPY N/A 05/01/2017   Dr. Gala Romney: Diverticulosis, next colonoscopy in 5 years.   COLONOSCOPY WITH PROPOFOL N/A 04/28/2019   Propofol; Dr. Gala Romney; moderate, medium sized grade 1 internal hemorrhoids, diverticulosis in sigmoid and descending colon, otherwise normal.  Repeat in 5 years.   ENDARTERECTOMY Right 07/07/2016   Procedure: ENDARTERECTOMY CAROTID;  Surgeon: Rosetta Posner, MD;  Location: [facility];  Service: Vascular;  Laterality: Right;   ESOPHAGOGASTRODUODENOSCOPY N/A 12/09/2014   Dr. Gala Romney: Erosive reflux esophagitis. Peptic Ulcer disease secondary to H.pylori. small hiatal hernia. Nonbleeding duodenal AVM. Duodenal erosions. TREATED WITH PREVPAC   ESOPHAGOGASTRODUODENOSCOPY N/A 03/15/2015   Dr. Gala Romney: small hiatal hernia, healed PUD, duodenal AVM.    ESOPHAGOGASTRODUODENOSCOPY N/A 05/01/2017   Dr. Gala Romney: LA grade a esophagitis, gastritis, no H pylori   ESOPHAGOGASTRODUODENOSCOPY (EGD) WITH PROPOFOL N/A 08/19/2018   PROPOFOL;  Surgeon: Daneil Dolin, MD; normal esophagus, mild erythema and erosions in the entire stomach, small hiatal hernia, subtle white papular areas and duodenal bulb second and third portions of the duodenum.   FRACTURE SURGERY     LEFT HEART CATH AND CORONARY ANGIOGRAPHY N/A 03/12/2018   Procedure: LEFT HEART CATH AND CORONARY ANGIOGRAPHY;  Surgeon: Burnell Blanks, MD;  Location: [place] [facility];  Service: Cardiovascular;  Laterality: N/A;   Multiple bilateral arm/wrist surgeries after falls     ORIF SHOULDER FRACTURE Left 06/2002   "shattered; put a bunch of metal plates in it"   PATCH ANGIOPLASTY Right 07/07/2016   Procedure: PATCH ANGIOPLASTY USING 0.8CM X 7.6CM HEMASHIELD PATCH;  Surgeon:  Rosetta Posner, MD;  Location: [facility] OR;  Service: Vascular;  Laterality: Right;   SHOULDER ARTHROSCOPY W/ ROTATOR CUFF REPAIR Right 1999   SHOULDER OPEN ROTATOR CUFF REPAIR Left 1992   Metal implant        Home Medications    Prior to Admission medications   Medication Sig Start Date End Date Taking? Authorizing Provider  amLODipine (NORVASC) 10 MG tablet TAKE 1 TABLET BY MOUTH EVERY DAY 05/15/22  Yes Satira Sark, MD  aspirin EC 81 MG tablet Take 81 mg by mouth at bedtime.    Yes [provider]  benzonatate (TESSALON) 200 MG capsule Take 1 capsule (200 mg total) by mouth 3 (three) times daily as needed for cough. 04/08/22  Yes Orpah Greek, MD  buPROPion [facility] SR) 150 MG 12 hr tablet Take 150 mg by mouth 2 (two) times daily.   Yes [provider]  fluticasone (FLONASE) 50 MCG/ACT nasal spray Place 2 sprays into both nostrils daily as needed for allergies.    Yes  [provider]  hydrochlorothiazide (HYDRODIURIL) 12.5 MG tablet Take 12.5 mg by mouth daily. 11/29/18  Yes [provider]  isosorbide mononitrate (IMDUR) 30 MG 24 hr tablet Take 1 tablet (30 mg total) by mouth daily. 11/16/20 02/24/23 Yes Verta Ellen., NP  levothyroxine (SYNTHROID) 175 MCG tablet Take 175 mcg by mouth daily. 01/14/21  Yes [provider]  losartan (COZAAR) 50 MG tablet Take 1 tablet by mouth daily. 08/09/21  Yes [provider]  metoprolol succinate (TOPROL XL) 25 MG 24 hr tablet Take 1 tablet (25 mg total) by mouth daily. 04/25/18 02/24/23 Yes Aviva Signs, MD  nitroGLYCERIN (NITROSTAT) 0.4 MG SL tablet Place under the tongue. 01/31/21  Yes [provider]  pantoprazole (PROTONIX) 40 MG tablet Take 40 mg by mouth daily.   Yes [provider]  rosuvastatin (CRESTOR) 20 MG tablet Take 20 mg by mouth at bedtime. 01/31/21  Yes [provider]  tiZANidine (ZANAFLEX) 4 MG capsule Take 1 capsule (4 mg total) by mouth 3  (three) times daily as needed for muscle spasms. Do not drink alcohol or drive while taking this medication.  May cause drowsiness 08/26/22  Yes Volney American, PA-C  amoxicillin-clavulanate (AUGMENTIN) 875-125 MG tablet Take 1 tablet by mouth every 12 (twelve) hours. 04/08/22   Orpah Greek, MD  predniSONE (DELTASONE) 20 MG tablet Take 2 tablets (40 mg total) by mouth daily with breakfast. 04/08/22   Pollina, Gwenyth Allegra, MD  dicyclomine (BENTYL) 20 MG tablet Take 1 tablet (20 mg total) by mouth 2 (two) times daily. 06/07/16 06/07/16  Comer Locket, PA-C    Family History Family History  Problem Relation Age of Onset   COPD Mother    Mental illness Mother    Hypertension Mother    Varicose Veins Mother    Heart attack Mother 31   Hypertension Father    Alcohol abuse Father    Colon cancer Neg Hx     Social History Social History   Tobacco Use   Smoking status: Former    Packs/day: 0.20    Years: 45.00    Total pack years: 9.00    Types: Cigarettes    Quit date: 12/21/2017    Years since quitting: 4.6   Smokeless tobacco: Never  Vaping Use   Vaping Use: Former  Substance Use Topics   Alcohol use: Yes    Alcohol/week: 0.0 standard drinks of alcohol    Comment: 1-2 beers in 6 months.    Drug use: No     Allergies   Lyrica [pregabalin], Celecoxib, and Codeine   Review of Systems Review of Systems Per HPI  Physical Exam Triage Vital Signs ED Triage Vitals  Enc Vitals Group     BP 08/26/22 1159 106/67     Pulse Rate 08/26/22 1159 82     Resp 08/26/22 1159 18     Temp 08/26/22 1159 97.8 F (36.6 C)     Temp Source 08/26/22 1159 Oral     SpO2 08/26/22 1159 95 %     Weight 08/26/22 1157 225 lb (102.1 kg)     Height 08/26/22 1157 6' (1.829 m)     Head Circumference --      Peak Flow --      Pain Score 08/26/22 1157 5     Pain Loc --      Pain Edu? --      Excl. in [place]? --    No data found.  Updated  Vital Signs BP 106/67 (BP Location:  Right Arm)   Pulse 82   Temp 97.8 F (36.6 C) (Oral)   Resp 18   Ht 6' (1.829 m)   Wt 225 lb (102.1 kg)   SpO2 95%   BMI 30.52 kg/m   Visual Acuity Right Eye Distance:   Left Eye Distance:   Bilateral Distance:    Right Eye Near:   Left Eye Near:    Bilateral Near:     Physical Exam Vitals and nursing note reviewed.  Constitutional:      Appearance: Normal appearance.  HENT:     Head: Atraumatic.  Eyes:     Extraocular Movements: Extraocular movements intact.     Conjunctiva/sclera: Conjunctivae normal.  Cardiovascular:     Rate and Rhythm: Normal rate and regular rhythm.  Pulmonary:     Effort: Pulmonary effort is normal.     Breath sounds: Normal breath sounds.  Musculoskeletal:        General: Tenderness present. Normal range of motion.     Cervical back: Normal range of motion and neck supple.     Comments: Left lateral low back and posterior buttock tenderness to palpation.  Negative straight leg raise bilateral lower extremities.  No midline spinal tenderness to palpation diffusely  Skin:    General: Skin is warm and dry.  Neurological:     General: No focal deficit present.     Mental Status: He is oriented to person, place, and time.     Comments: Bilateral lower extremities neurovascularly intact  Psychiatric:        Mood and Affect: Mood normal.        Thought Content: Thought content normal.        Judgment: Judgment normal.      UC Treatments / Results  Labs (all labs ordered are listed, but only abnormal results are displayed) Labs Reviewed - No data to display  EKG   Radiology No results found.  Procedures Procedures (including critical care time)  Medications Ordered in UC Medications  methylPREDNISolone sodium succinate (SOLU-MEDROL) 125 mg/2 mL injection 60 mg (60 mg Intramuscular Given by Other 08/26/22 1245)    Initial Impression / Assessment and Plan / UC Course  I have reviewed the triage vital signs and the nursing  notes.  Pertinent labs & imaging results that were available during my care of the patient were reviewed by me and considered in my medical decision making (see chart for details).     We will treat with IM Solu-Medrol, Zanaflex, heat, massage, stretches.  Return for worsening symptoms.  No red flag findings today.  Final Clinical Impressions(s) / UC Diagnoses   Final diagnoses:  Piriformis syndrome of left side   Discharge Instructions   None    ED Prescriptions     Medication Sig Dispense Auth. Provider   tiZANidine (ZANAFLEX) 4 MG capsule Take 1 capsule (4 mg total) by mouth 3 (three) times daily as needed for muscle spasms. Do not drink alcohol or drive while taking this medication.  May cause drowsiness 15 capsule Volney American, [place]      PDMP not reviewed this encounter.   Volney American, [place] 08/26/22 1316

## 2022-08-29 DIAGNOSIS — Z299 Encounter for prophylactic measures, unspecified: Secondary | ICD-10-CM | POA: Diagnosis not present

## 2022-08-29 DIAGNOSIS — M545 Low back pain, unspecified: Secondary | ICD-10-CM | POA: Diagnosis not present

## 2022-08-29 DIAGNOSIS — I10 Essential (primary) hypertension: Secondary | ICD-10-CM | POA: Diagnosis not present

## 2022-09-04 ENCOUNTER — Ambulatory Visit: Payer: Medicare Other | Attending: Cardiology

## 2022-09-04 DIAGNOSIS — I6523 Occlusion and stenosis of bilateral carotid arteries: Secondary | ICD-10-CM | POA: Diagnosis not present

## 2022-09-07 DIAGNOSIS — H31091 Other chorioretinal scars, right eye: Secondary | ICD-10-CM | POA: Diagnosis not present

## 2022-09-07 DIAGNOSIS — H524 Presbyopia: Secondary | ICD-10-CM | POA: Diagnosis not present

## 2022-10-20 DIAGNOSIS — J069 Acute upper respiratory infection, unspecified: Secondary | ICD-10-CM | POA: Diagnosis not present

## 2022-10-20 DIAGNOSIS — Z299 Encounter for prophylactic measures, unspecified: Secondary | ICD-10-CM | POA: Diagnosis not present

## 2022-10-20 DIAGNOSIS — R5383 Other fatigue: Secondary | ICD-10-CM | POA: Diagnosis not present

## 2022-11-10 DIAGNOSIS — N390 Urinary tract infection, site not specified: Secondary | ICD-10-CM | POA: Diagnosis not present

## 2022-11-10 DIAGNOSIS — H6990 Unspecified Eustachian tube disorder, unspecified ear: Secondary | ICD-10-CM | POA: Diagnosis not present

## 2022-11-10 DIAGNOSIS — I10 Essential (primary) hypertension: Secondary | ICD-10-CM | POA: Diagnosis not present

## 2022-11-10 DIAGNOSIS — R350 Frequency of micturition: Secondary | ICD-10-CM | POA: Diagnosis not present

## 2022-11-10 DIAGNOSIS — Z299 Encounter for prophylactic measures, unspecified: Secondary | ICD-10-CM | POA: Diagnosis not present

## 2022-11-17 ENCOUNTER — Telehealth: Payer: Self-pay | Admitting: Cardiology

## 2022-11-17 MED ORDER — METOPROLOL SUCCINATE ER 25 MG PO TB24: 25.0000 mg | ORAL_TABLET | Freq: Every day | ORAL | 1 refills | Status: DC

## 2022-11-17 MED ORDER — LOSARTAN POTASSIUM 50 MG PO TABS: 50.0000 mg | ORAL_TABLET | Freq: Every day | ORAL | 1 refills | Status: DC

## 2022-11-17 NOTE — Telephone Encounter (Signed)
Due for f/u April 2024, refilled as requested

## 2022-11-17 NOTE — Telephone Encounter (Signed)
*  STAT* If patient is at the pharmacy, call can be transferred to refill team.   1. Which medications need to be refilled? (please list name of each medication and dose if known)   metoprolol succinate (TOPROL XL) 25 MG 24 hr tablet  losartan (COZAAR) 50 MG tablet   2. Which pharmacy/location (including street and city if local pharmacy) is medication to be sent to?  [place], [place] - [address]   3. Do they need a 30 day or 90 day supply? 90 day  Patient stated he is completely out of these medications and has been out for 3-4 months.

## 2022-11-28 ENCOUNTER — Encounter: Payer: Self-pay | Admitting: Internal Medicine

## 2022-12-24 NOTE — Progress Notes (Unsigned)
Referring Provider: Glenda Chroman, MD Primary Care Physician:  Glenda Chroman, MD Primary GI Physician: Dr. Gala Romney  No chief complaint on file.   HPI:   Seth Savage is a 72 y.o. male presenting today for follow-up. He has history of fatty liver, GERD, PUD secondary to H. pylori s/p documented eradication by biopsies in 2018, chronic RUQ abdominal pain predating cholecystectomy in June 2019 s/p CT A/P with contrast July 2019 with small post op fluid collection, no other significant findings, EGD in September 2019 with subtle abnormalities of stomach and duodenum of doubtful clinical significance (pathology benign), and RUQ [place] with no significant findings. Celiac screen negative in 2022.   Also with partial colectomy for intussusception in 2000, colon polyps, rectal bleeding in the setting of hemorrhoids, and chronic loose stool following cholecystectomy.   Last colonoscopy in June 2020 with moderate, medium size grade 1 internal hemorrhoids, diverticulosis in the sigmoid and descending colon, otherwise normal.  Due for repeat colonoscopy in 2025.    Last seen in our office 09/07/21.  GERD well-controlled on Protonix daily.  Reported his chronic intermittent RUQ abdominal pain/bloating had resolved with eating a healthy diet and limiting portion sizes.  He was trying to eat healthy and reported a lot of walking at work.  He was advised to continue his current medications and counseled on fatty liver, encouraged to continue weight loss efforts.  Planned to follow-up in 1 year.  Today:    LFTs and platelets normal March 2023.   FIB4 March 2023: 1.03- advanced fibrosis excluded   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   Carotid artery disease ([place])    Left CEA 2001 and right CEA 2004   Childhood asthma    COPD (chronic obstructive pulmonary disease) ([place])     Essential hypertension    Fatty liver    GERD (gastroesophageal reflux disease)    Goiter    Radioactive iodine treatment   H pylori ulcer 11/2014   Treated with Prevpac; documented eradication by biopsy in 2018   History of bronchitis    History of kidney stones    History of pneumonia as a child    Hypothyroidism    Migraine    Mixed hyperlipidemia    Neuropathy     Past Surgical History:  Procedure Laterality Date   BIOPSY  05/01/2017   Procedure: BIOPSY;  Surgeon: Daneil Dolin, MD;  [facility];  Service: Endoscopy;;  gastric   BIOPSY  08/19/2018   Procedure: BIOPSY;  Surgeon: Daneil Dolin, MD;  [facility]: [facility];  Service: Endoscopy;;  duodenal and gastric   CARDIAC CATHETERIZATION N/A 07/05/2016   Procedure: Left Heart Cath and Coronary Angiography;  Surgeon: Burnell Blanks, MD;  Location: [place] [facility];  Service: Cardiovascular;  Laterality: N/A;   CARDIAC CATHETERIZATION  03/12/2018   CAROTID ENDARTERECTOMY Left ?2009   CARPAL TUNNEL RELEASE Right    CHOLECYSTECTOMY N/A 04/24/2018   Procedure: LAPAROSCOPIC CHOLECYSTECTOMY;  Surgeon: Aviva Signs, MD;  Location: [facility];  Service: General;  Laterality: N/A;   COLECTOMY  2000   Secondary to intussuception   COLONOSCOPY  08/24/2004   Normal rectum,Small polyp at 25 cm, cold snared/ The remainder of the colonic mucosa appeared normal   COLONOSCOPY  09/24/2012   Dr. Mike Craze hemorrhoids-likely source  of hematochezia.Colonic diverticulosis   COLONOSCOPY N/A 05/01/2017   Dr. Gala Romney: Diverticulosis, next colonoscopy in 5 years.   COLONOSCOPY WITH PROPOFOL N/A 04/28/2019   Propofol; Dr. Gala Romney; moderate, medium sized grade 1 internal hemorrhoids, diverticulosis in sigmoid and descending colon, otherwise normal.  Repeat in 5 years.   ENDARTERECTOMY Right 07/07/2016   Procedure: ENDARTERECTOMY CAROTID;  Surgeon: Rosetta Posner, MD;  Location: [facility] OR;  Service: Vascular;  Laterality: Right;    ESOPHAGOGASTRODUODENOSCOPY N/A 12/09/2014   Dr. Gala Romney: Erosive reflux esophagitis. Peptic Ulcer disease secondary to H.pylori. small hiatal hernia. Nonbleeding duodenal AVM. Duodenal erosions. TREATED WITH PREVPAC   ESOPHAGOGASTRODUODENOSCOPY N/A 03/15/2015   Dr. Gala Romney: small hiatal hernia, healed PUD, duodenal AVM.    ESOPHAGOGASTRODUODENOSCOPY N/A 05/01/2017   Dr. Gala Romney: LA grade a esophagitis, gastritis, no H pylori   ESOPHAGOGASTRODUODENOSCOPY (EGD) WITH PROPOFOL N/A 08/19/2018   PROPOFOL;  Surgeon: Daneil Dolin, MD; normal esophagus, mild erythema and erosions in the entire stomach, small hiatal hernia, subtle white papular areas and duodenal bulb second and third portions of the duodenum.   FRACTURE SURGERY     LEFT HEART CATH AND CORONARY ANGIOGRAPHY N/A 03/12/2018   Procedure: LEFT HEART CATH AND CORONARY ANGIOGRAPHY;  Surgeon: Burnell Blanks, MD;  Location: [place] [facility];  Service: Cardiovascular;  Laterality: N/A;   Multiple bilateral arm/wrist surgeries after falls     ORIF SHOULDER FRACTURE Left 06/2002   "shattered; put a bunch of metal plates in it"   PATCH ANGIOPLASTY Right 07/07/2016   Procedure: PATCH ANGIOPLASTY USING 0.8CM X 7.6CM HEMASHIELD PATCH;  Surgeon: Rosetta Posner, MD;  Location: [facility] OR;  Service: Vascular;  Laterality: Right;   SHOULDER ARTHROSCOPY W/ ROTATOR CUFF REPAIR Right 1999   SHOULDER OPEN ROTATOR CUFF REPAIR Left 1992   Metal implant     Current Outpatient Medications  Medication Sig Dispense Refill   amLODipine (NORVASC) 10 MG tablet TAKE 1 TABLET BY MOUTH EVERY DAY 90 tablet 3   amoxicillin-clavulanate (AUGMENTIN) 875-125 MG tablet Take 1 tablet by mouth every 12 (twelve) hours. 20 tablet 0   aspirin EC 81 MG tablet Take 81 mg by mouth at bedtime.      benzonatate (TESSALON) 200 MG capsule Take 1 capsule (200 mg total) by mouth 3 (three) times daily as needed for cough. 20 capsule 0   buPROPion (WELLBUTRIN SR) 150 MG 12 hr tablet Take 150 mg  by mouth 2 (two) times daily.     fluticasone (FLONASE) 50 MCG/ACT nasal spray Place 2 sprays into both nostrils daily as needed for allergies.      hydrochlorothiazide (HYDRODIURIL) 12.5 MG tablet Take 12.5 mg by mouth daily.     isosorbide mononitrate (IMDUR) 30 MG 24 hr tablet Take 1 tablet (30 mg total) by mouth daily. 90 tablet 3   levothyroxine (SYNTHROID) 175 MCG tablet Take 175 mcg by mouth daily.     losartan (COZAAR) 50 MG tablet Take 1 tablet (50 mg total) by mouth daily. 90 tablet 1   metoprolol succinate (TOPROL XL) 25 MG 24 hr tablet Take 1 tablet (25 mg total) by mouth daily. 90 tablet 1   nitroGLYCERIN (NITROSTAT) 0.4 MG SL tablet Place under the tongue.     pantoprazole (PROTONIX) 40 MG tablet Take 40 mg by mouth daily.     predniSONE (DELTASONE) 20 MG tablet Take 2 tablets (40 mg total) by mouth daily with breakfast. 10 tablet 0   rosuvastatin (CRESTOR) 20 MG tablet Take 20 mg  by mouth at bedtime.     tiZANidine (ZANAFLEX) 4 MG capsule Take 1 capsule (4 mg total) by mouth 3 (three) times daily as needed for muscle spasms. Do not drink alcohol or drive while taking this medication.  May cause drowsiness 15 capsule 0   No current facility-administered medications for this visit.    Allergies as of 12/25/2022 - Review Complete 08/26/2022  Allergen Reaction Noted   Lyrica [pregabalin] Shortness Of Breath 09/17/2012   Celecoxib Other (See Comments)    Codeine Itching 11/09/2014    Family History  Problem Relation Age of Onset   COPD Mother    Mental illness Mother    Hypertension Mother    Varicose Veins Mother    Heart attack Mother 66   Hypertension Father    Alcohol abuse Father    Colon cancer Neg Hx     Social History   Socioeconomic History   Marital status: Married    Spouse name: Margie   Number of children: 0   Years of education: Ged   Highest education level: Not on file  Occupational History    Comment: Disabled  Tobacco Use   Smoking status:  Former    Packs/day: 0.20    Years: 45.00    Total pack years: 9.00    Types: Cigarettes    Quit date: 12/21/2017    Years since quitting: 5.0   Smokeless tobacco: Never  Vaping Use   Vaping Use: Former  Substance and Sexual Activity   Alcohol use: Yes    Alcohol/week: 0.0 standard drinks of alcohol    Comment: 1-2 beers in 6 months.    Drug use: No   Sexual activity: Not Currently  Other Topics Concern   Not on file  Social History Narrative   Patient lives at home with his wife. [facility]    Disabled.   Right handed.   Caffeine- two cups daily.   Three step children.   Social Determinants of Health   Financial Resource Strain: Not on file  Food Insecurity: Not on file  Transportation Needs: Not on file  Physical Activity: Not on file  Stress: Not on file  Social Connections: Not on file    Review of Systems: Gen: Denies fever, chills, anorexia. Denies fatigue, weakness, weight loss.  CV: Denies chest pain, palpitations, syncope, peripheral edema, and claudication. Resp: Denies dyspnea at rest, cough, wheezing, coughing up blood, and pleurisy. GI: Denies vomiting blood, jaundice, and fecal incontinence.   Denies dysphagia or odynophagia. Derm: Denies rash, itching, dry skin Psych: Denies depression, anxiety, memory loss, confusion. No homicidal or suicidal ideation.  Heme: Denies bruising, bleeding, and enlarged lymph nodes.  Physical Exam: There were no vitals taken for this visit. General:   Alert and oriented. No distress noted. Pleasant and cooperative.  Head:  Normocephalic and atraumatic. Eyes:  Conjuctiva clear without scleral icterus. Heart:  S1, S2 present without murmurs appreciated. Lungs:  Clear to auscultation bilaterally. No wheezes, rales, or rhonchi. No distress.  Abdomen:  +BS, soft, non-tender and non-distended. No rebound or guarding. No HSM or masses noted. Msk:  Symmetrical without gross deformities. Normal posture. Extremities:  Without  edema. Neurologic:  Alert and  oriented x4 Psych:  Normal mood and affect.    Assessment:     Plan:  ***   Aliene Altes, PA-C [facility] 12/25/2022

## 2022-12-25 ENCOUNTER — Encounter: Payer: Self-pay | Admitting: Gastroenterology

## 2022-12-25 ENCOUNTER — Ambulatory Visit (INDEPENDENT_AMBULATORY_CARE_PROVIDER_SITE_OTHER): Payer: No Typology Code available for payment source | Admitting: Gastroenterology

## 2022-12-25 VITALS — BP 119/69 | HR 63 | Temp 97.5°F | Ht 72.0 in | Wt 215.6 lb

## 2022-12-25 DIAGNOSIS — K219 Gastro-esophageal reflux disease without esophagitis: Secondary | ICD-10-CM

## 2022-12-25 DIAGNOSIS — K760 Fatty (change of) liver, not elsewhere classified: Secondary | ICD-10-CM

## 2022-12-25 NOTE — Patient Instructions (Signed)
Continue pantoprazole 40 mg daily 30 minutes before breakfast.  You should have your liver enzymes checked yearly. This can be completed with your primary care doctor. They will need to let me know of any abnormalities.   Instructions for fatty liver: Recommend 1-2# weight loss per week until ideal body weight through exercise & diet. Low fat/cholesterol diet.   Avoid sweets, sodas, fruit juices, sweetened beverages like tea, etc. Gradually increase exercise from 15 min daily up to 1 hr per day 5 days/week. Limit alcohol use.  You will be due for colonoscopy in June 2025.  Will plan to see you back in about 1 year.  Do not hesitate to call sooner if you have questions or concerns.  It was great to see you today!  I am glad you are feeling well overall!  Aliene Altes, PA-C [facility]

## 2023-01-03 ENCOUNTER — Ambulatory Visit (INDEPENDENT_AMBULATORY_CARE_PROVIDER_SITE_OTHER): Payer: No Typology Code available for payment source | Admitting: Vascular Surgery

## 2023-01-03 ENCOUNTER — Encounter: Payer: Self-pay | Admitting: Vascular Surgery

## 2023-01-03 VITALS — BP 151/75 | HR 60 | Temp 97.9°F | Ht 72.0 in | Wt 222.0 lb

## 2023-01-03 DIAGNOSIS — Z9889 Other specified postprocedural states: Secondary | ICD-10-CM | POA: Diagnosis not present

## 2023-01-03 NOTE — Progress Notes (Signed)
[facility] [place]  Patient name: Seth Savage MRN: [id number] DOB: 1951/02/17 Sex: male  REASON FOR VISIT: Follow-up prior carotid surgery  HPI: Seth Savage is a 72 y.o. male here today for follow-up.  He is well-known to me from prior staged bilateral carotid endarterectomies.  Left side was in 2001 and right side 2004.  Had no neurologic deficits.  He does report recent episodes of a tingling sensation in his left chest.  No substernal pain and no symptoms suggestive of angina.  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   Carotid artery disease ([place])    Left CEA 2001 and right CEA 2004   Childhood asthma    COPD (chronic obstructive pulmonary disease) ([place])    Essential hypertension    Fatty liver    GERD (gastroesophageal reflux disease)    Goiter    Radioactive iodine treatment   H pylori ulcer 11/2014   Treated with Prevpac; documented eradication by biopsy in 2018   History of bronchitis    History of kidney stones    History of pneumonia as a child    Hypothyroidism    Migraine    Mixed hyperlipidemia    Neuropathy     Family History  Problem Relation Age of Onset   COPD Mother    Mental illness Mother    Hypertension Mother    Varicose Veins Mother    Heart attack Mother 51   Hypertension Father    Alcohol abuse Father    Colon cancer Neg Hx     SOCIAL HISTORY: Social History   Tobacco Use   Smoking status: Former    Packs/day: 0.20    Years: 45.00    Total pack years: 9.00    Types: Cigarettes    Quit date: 12/21/2017    Years since quitting: 5.0   Smokeless tobacco: Never  Substance Use Topics   Alcohol use: Yes    Alcohol/week: 0.0 standard drinks of alcohol    Comment: 1-2 beers in 6 months.     Allergies  Allergen Reactions   Lyrica [Pregabalin]  Shortness Of Breath   Celecoxib Other (See Comments)    GI BLEED, FATIGUE   Codeine Itching    Current Outpatient Medications  Medication Sig Dispense Refill   amLODipine (NORVASC) 10 MG tablet TAKE 1 TABLET BY MOUTH EVERY DAY 90 tablet 3   aspirin EC 81 MG tablet Take 81 mg by mouth at bedtime.      buPROPion (WELLBUTRIN SR) 150 MG 12 hr tablet Take 150 mg by mouth 2 (two) times daily.     fluticasone (FLONASE) 50 MCG/ACT nasal spray Place 2 sprays into both nostrils daily as needed for allergies.      hydrochlorothiazide (HYDRODIURIL) 12.5 MG tablet Take 12.5 mg by mouth daily.     isosorbide mononitrate (IMDUR) 30 MG 24 hr tablet Take 1 tablet (30 mg total) by mouth daily. 90 tablet 3   levothyroxine (SYNTHROID) 175 MCG tablet Take 175 mcg by mouth daily.     losartan (COZAAR) 50 MG tablet Take 1 tablet (50 mg total) by mouth daily. 90 tablet 1   metoprolol succinate (TOPROL XL) 25 MG 24 hr tablet Take 1 tablet (25 mg total) by mouth daily. 90 tablet 1   nitroGLYCERIN (NITROSTAT) 0.4 MG SL tablet  Place under the tongue.     pantoprazole (PROTONIX) 40 MG tablet Take 40 mg by mouth daily.     rosuvastatin (CRESTOR) 20 MG tablet Take 20 mg by mouth at bedtime.     tiZANidine (ZANAFLEX) 4 MG capsule Take 1 capsule (4 mg total) by mouth 3 (three) times daily as needed for muscle spasms. Do not drink alcohol or drive while taking this medication.  May cause drowsiness (Patient not taking: Reported on 01/03/2023) 15 capsule 0   No current facility-administered medications for this visit.    REVIEW OF SYSTEMS:  [X]$  denotes positive finding, [ ]$  denotes negative finding Cardiac  Comments:  Chest pain or chest pressure:    Shortness of breath upon exertion:    Short of breath when lying flat:    Irregular heart rhythm:        Vascular    Pain in calf, thigh, or hip brought on by ambulation:    Pain in feet at night that wakes you up from your sleep:     Blood clot in your veins:    Leg  swelling:           PHYSICAL EXAM: Vitals:   01/03/23 1410 01/03/23 1412  BP: (!) 145/76 (!) 151/75  Pulse: 60   Temp: 97.9 F (36.6 C)   SpO2: 96%   Weight: 222 lb (100.7 kg)   Height: 6' (1.829 m)     GENERAL: The patient is a well-nourished male, in no acute distress. The vital signs are documented above. CARDIOVASCULAR: Well-healed carotid incisions bilaterally.  No carotid bruits bilaterally PULMONARY: There is good air exchange  MUSCULOSKELETAL: There are no major deformities or cyanosis. NEUROLOGIC: No focal weakness or paresthesias are detected. SKIN: There are no ulcers or rashes noted. PSYCHIATRIC: The patient has a normal affect.  DATA:  Carotid duplex from October 2023 were reviewed with the patient.  This shows widely patent endarterectomies bilaterally  MEDICAL ISSUES: Stable overall.  He will notify his cardiologist should he have any more difficulty with chest issues.  We will see him again in 2 years with repeat carotid duplex    Rosetta Posner, MD FACS [facility] [phone]  Note: Portions of this report may have been transcribed using voice recognition software.  Every effort has been made to ensure accuracy; however, inadvertent computerized transcription errors may still be present.

## 2023-01-29 ENCOUNTER — Telehealth: Payer: Self-pay | Admitting: Nurse Practitioner

## 2023-01-29 MED ORDER — ISOSORBIDE MONONITRATE ER 30 MG PO TB24: 30.0000 mg | ORAL_TABLET | Freq: Every day | ORAL | 1 refills | Status: DC

## 2023-01-29 NOTE — Telephone Encounter (Signed)
Patient walked into the office requesting a refill      1. Which medications need to be refilled? (please list name of each medication and dose if known) isosorbide mononitrate (IMDUR) 30 MG 24 hr   2. Which pharmacy/location (including street and city if local pharmacy) is medication to be sent to?  [facility]    3. Do they need a 30 day or 90 day supply? [place]

## 2023-03-07 ENCOUNTER — Encounter: Payer: Self-pay | Admitting: Emergency Medicine

## 2023-03-07 ENCOUNTER — Other Ambulatory Visit: Payer: Self-pay

## 2023-03-07 ENCOUNTER — Ambulatory Visit
Admission: EM | Admit: 2023-03-07 | Discharge: 2023-03-07 | Disposition: A | Discharge status: Home / Self Care | Payer: No Typology Code available for payment source | Attending: Nurse Practitioner | Admitting: Nurse Practitioner

## 2023-03-07 DIAGNOSIS — H1032 Unspecified acute conjunctivitis, left eye: Secondary | ICD-10-CM | POA: Diagnosis not present

## 2023-03-07 MED ORDER — NEOMYCIN-POLYMYXIN-DEXAMETH 3.5-10000-0.1 OP SUSP: 1.0000 [drp] | Freq: Four times a day (QID) | OPHTHALMIC | 0 refills | Status: AC

## 2023-03-07 NOTE — Discharge Instructions (Addendum)
Use eyedrops as prescribed.  If you notice symptoms in the right eye, begin using medication in both eyes. If you continue to experience matting or drainage, use a warm moist cloth to clean the left eye. Cool compresses to the eyes to help with pain or swelling.  May use warm compresses as needed to help with eye pain or discomfort. May use over-the-counter eyedrops such as Visine or Clear Eyes to help keep the eyes moist and decrease redness.   Strict handwashing when applying medication.  Avoid rubbing or manipulating the eyes while symptoms persist. If you notice any change in your vision or sudden loss of vision, please go to the emergency department immediately.   As discussed, if symptoms do not improve with this treatment, recommend following up with your eye doctor for further evaluation. Follow-up as needed.

## 2023-03-07 NOTE — ED Provider Notes (Signed)
[facility]    CSN: [id number] Arrival date & time: 03/07/23  0849      History   Chief Complaint Chief Complaint  Patient presents with   Eye Problem    HPI Seth Savage is a 72 y.o. male.   The history is provided by the patient.   The patient presents for complaints of left eye redness, irritation, swelling, and drainage.  Symptoms started over the past 24 hours after he finished mowing his yard.  He denies "feeling of a foreign body in the eye", change in vision, loss of vision, light sensitivity, headache, dizziness, or eye pain.  He does endorse some irritation and mild itching.  Patient reports that he does use fluticasone for allergies, but states that he does not use it every day.  He denies any obvious known sick contacts.  He reports this morning when he woke up, noticed that the eye was matted shut.  He also states that all day yesterday, the eye looked like it was draining "pus".  Patient wears eyeglasses only.  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   Carotid artery disease    Left CEA 2001 and right CEA 2004   Childhood asthma    COPD (chronic obstructive pulmonary disease)    Essential hypertension    Fatty liver    GERD (gastroesophageal reflux disease)    Goiter    Radioactive iodine treatment   H pylori ulcer 11/2014   Treated with Prevpac; documented eradication by biopsy in 2018   History of bronchitis    History of kidney stones    History of pneumonia as a child    Hypothyroidism    Migraine    Mixed hyperlipidemia    Neuropathy     Patient Active Problem List   Diagnosis Date Noted   Dysphagia 03/07/2021   Fatty liver 09/03/2020   Loose stools 09/03/2020   Heme positive stool 05/13/2018   Abdominal pain, epigastric 05/13/2018   Acute URI 05/02/2018   Adult body mass index 28.0-28.9 05/02/2018    Allergic rhinitis 05/02/2018   Elevated PSA 05/02/2018   Screening for prostate cancer 05/02/2018   Fatigue 05/02/2018   Former smoker 05/02/2018   Gastroesophageal reflux disease 05/02/2018   History of hay fever 05/02/2018   History of migraine headaches 05/02/2018   History of hypothyroidism 05/02/2018   History of renal calculi 05/02/2018   Hypercholesterolemia 05/02/2018   Hypothyroid 05/02/2018   Kidney stone 05/02/2018   Migraine 05/02/2018   Screening for depression 05/02/2018   Biliary colic 04/25/2018   Ventricular tachyarrhythmia 04/24/2018   Gallbladder sludge    VT (ventricular tachycardia)    Coronary artery disease due to lipid rich plaque    S/P laparoscopic cholecystectomy    Unstable angina 03/12/2018   History of colonic polyps 03/23/2017   RUQ pain 02/07/2017   Asymptomatic carotid artery stenosis 07/07/2016   Abnormal stress test    Coronary artery disease involving native coronary artery of native heart without angina pectoris    Tobacco use disorder 03/23/2015   Essential hypertension 03/23/2015   Chest pain 03/22/2015   History of peptic ulcer disease    PUD (peptic ulcer disease) 02/25/2015   Acute gastric ulcer    Reflux esophagitis    Headache(784.0) 09/03/2013   Occlusion and stenosis of carotid artery without mention  of cerebral infarction 12/12/2012   LUQ pain 09/23/2012   Hematochezia 09/23/2012    Past Surgical History:  Procedure Laterality Date   BIOPSY  05/01/2017   Procedure: BIOPSY;  Surgeon: Corbin Ade, MD;  Location: [facility];  Service: Endoscopy;;  gastric   BIOPSY  08/19/2018   Procedure: BIOPSY;  Surgeon: Corbin Ade, MD;  Location: [facility];  Service: Endoscopy;;  duodenal and gastric   CARDIAC CATHETERIZATION N/A 07/05/2016   Procedure: Left Heart Cath and Coronary Angiography;  Surgeon: Kathleene Hazel, MD;  Location: [facility] INVASIVE CV LAB;  Service: Cardiovascular;  Laterality: N/A;   CARDIAC  CATHETERIZATION  03/12/2018   CAROTID ENDARTERECTOMY Left ?2009   CARPAL TUNNEL RELEASE Right    CHOLECYSTECTOMY N/A 04/24/2018   Procedure: LAPAROSCOPIC CHOLECYSTECTOMY;  Surgeon: Franky Macho, MD;  Location: [facility];  Service: General;  Laterality: N/A;   COLECTOMY  2000   Secondary to intussuception   COLONOSCOPY  08/24/2004   Normal rectum,Small polyp at 25 cm, cold snared/ The remainder of the colonic mucosa appeared normal   COLONOSCOPY  09/24/2012   Dr. Elly Modena hemorrhoids-likely source of hematochezia.Colonic diverticulosis   COLONOSCOPY N/A 05/01/2017   Dr. Jena Gauss: Diverticulosis, next colonoscopy in 5 years.   COLONOSCOPY WITH PROPOFOL N/A 04/28/2019   Propofol; Dr. Jena Gauss; moderate, medium sized grade 1 internal hemorrhoids, diverticulosis in sigmoid and descending colon, otherwise normal.  Repeat in 5 years.   ENDARTERECTOMY Right 07/07/2016   Procedure: ENDARTERECTOMY CAROTID;  Surgeon: Larina Earthly, MD;  Location: [facility];  Service: Vascular;  Laterality: Right;   ESOPHAGOGASTRODUODENOSCOPY N/A 12/09/2014   Dr. Jena Gauss: Erosive reflux esophagitis. Peptic Ulcer disease secondary to H.pylori. small hiatal hernia. Nonbleeding duodenal AVM. Duodenal erosions. TREATED WITH PREVPAC   ESOPHAGOGASTRODUODENOSCOPY N/A 03/15/2015   Dr. Jena Gauss: small hiatal hernia, healed PUD, duodenal AVM.    ESOPHAGOGASTRODUODENOSCOPY N/A 05/01/2017   Dr. Jena Gauss: LA grade a esophagitis, gastritis, no H pylori   ESOPHAGOGASTRODUODENOSCOPY (EGD) WITH PROPOFOL N/A 08/19/2018   PROPOFOL;  Surgeon: Corbin Ade, MD; normal esophagus, mild erythema and erosions in the entire stomach, small hiatal hernia, subtle white papular areas and duodenal bulb second and third portions of the duodenum.   FRACTURE SURGERY     LEFT HEART CATH AND CORONARY ANGIOGRAPHY N/A 03/12/2018   Procedure: LEFT HEART CATH AND CORONARY ANGIOGRAPHY;  Surgeon: Kathleene Hazel, MD;  Location: [facility] INVASIVE CV LAB;  Service: Cardiovascular;   Laterality: N/A;   Multiple bilateral arm/wrist surgeries after falls     ORIF SHOULDER FRACTURE Left 06/2002   "shattered; put a bunch of metal plates in it"   PATCH ANGIOPLASTY Right 07/07/2016   Procedure: PATCH ANGIOPLASTY USING 0.8CM X 7.6CM HEMASHIELD PATCH;  Surgeon: Larina Earthly, MD;  Location: [facility] OR;  Service: Vascular;  Laterality: Right;   SHOULDER ARTHROSCOPY W/ ROTATOR CUFF REPAIR Right 1999   SHOULDER OPEN ROTATOR CUFF REPAIR Left 1992   Metal implant        Home Medications    Prior to Admission medications   Medication Sig Start Date End Date Taking? Authorizing Provider  neomycin-polymyxin b-dexamethasone (MAXITROL) 3.5-10000-0.1 SUSP Place 1 drop into the left eye every 6 (six) hours for 7 days. 03/07/23 03/14/23 Yes Josephine Wooldridge-Warren, Sadie Haber, NP  amLODipine (NORVASC) 10 MG tablet TAKE 1 TABLET BY MOUTH EVERY DAY 05/15/22   Jonelle Sidle, MD  aspirin EC 81 MG tablet Take 81 mg by mouth at bedtime.     [provider]  buPROPion (WELLBUTRIN SR) 150 MG 12 hr tablet Take 150 mg by mouth 2 (two) times daily.    [provider]  fluticasone (FLONASE) 50 MCG/ACT nasal spray Place 2 sprays into both nostrils daily as needed for allergies.     [provider]  hydrochlorothiazide (HYDRODIURIL) 12.5 MG tablet Take 12.5 mg by mouth daily. 11/29/18   [provider]  isosorbide mononitrate (IMDUR) 30 MG 24 hr tablet Take 1 tablet (30 mg total) by mouth daily. 01/29/23   Jonelle Sidle, MD  levothyroxine (SYNTHROID) 175 MCG tablet Take 175 mcg by mouth daily. 01/14/21   [provider]  losartan (COZAAR) 50 MG tablet Take 1 tablet (50 mg total) by mouth daily. 11/17/22   Jonelle Sidle, MD  metoprolol succinate (TOPROL XL) 25 MG 24 hr tablet Take 1 tablet (25 mg total) by mouth daily. 11/17/22 11/17/23  Jonelle Sidle, MD  nitroGLYCERIN (NITROSTAT) 0.4 MG SL tablet Place under the tongue. 01/31/21   [provider]   pantoprazole (PROTONIX) 40 MG tablet Take 40 mg by mouth daily.    [provider]  rosuvastatin (CRESTOR) 20 MG tablet Take 20 mg by mouth at bedtime. 01/31/21   [provider]  tiZANidine (ZANAFLEX) 4 MG capsule Take 1 capsule (4 mg total) by mouth 3 (three) times daily as needed for muscle spasms. Do not drink alcohol or drive while taking this medication.  May cause drowsiness Patient not taking: Reported on 01/03/2023 08/26/22   Particia Nearing, PA-C  dicyclomine (BENTYL) 20 MG tablet Take 1 tablet (20 mg total) by mouth 2 (two) times daily. 06/07/16 06/07/16  Joycie Peek, PA-C    Family History Family History  Problem Relation Age of Onset   COPD Mother    Mental illness Mother    Hypertension Mother    Varicose Veins Mother    Heart attack Mother 53   Hypertension Father    Alcohol abuse Father    Colon cancer Neg Hx     Social History Social History   Tobacco Use   Smoking status: Former    Packs/day: 0.20    Years: 45.00    Additional pack years: 0.00    Total pack years: 9.00    Types: Cigarettes    Quit date: 12/21/2017    Years since quitting: 5.2   Smokeless tobacco: Never  Vaping Use   Vaping Use: Former  Substance Use Topics   Alcohol use: Yes    Alcohol/week: 0.0 standard drinks of alcohol    Comment: 1-2 beers in 6 months.    Drug use: No     Allergies   Lyrica [pregabalin], Celecoxib, and Codeine   Review of Systems Review of Systems Per HPI  Physical Exam Triage Vital Signs ED Triage Vitals  Enc Vitals Group     BP 03/07/23 0902 127/63     Pulse Rate 03/07/23 0902 63     Resp 03/07/23 0902 20     Temp 03/07/23 0902 98 F (36.7 C)     Temp Source 03/07/23 0902 Oral     SpO2 03/07/23 0902 95 %     Weight --      Height --      Head Circumference --      Peak Flow --      Pain Score 03/07/23 0901 0     Pain Loc --      Pain Edu? --  Excl. in GC? --    No data found.  Updated Vital Signs BP 127/63  (BP Location: Right Arm)   Pulse 63   Temp 98 F (36.7 C) (Oral)   Resp 20   SpO2 95%   Visual Acuity Right Eye Distance:   Left Eye Distance:   Bilateral Distance:    Right Eye Near:   Left Eye Near:    Bilateral Near:     Physical Exam Vitals and nursing note reviewed.  Constitutional:      General: He is not in acute distress.    Appearance: Normal appearance.  HENT:     Head: Normocephalic.     Right Ear: Tympanic membrane, ear canal and external ear normal.     Left Ear: Tympanic membrane, ear canal and external ear normal.     Nose: Congestion present.     Mouth/Throat:     Mouth: Mucous membranes are moist.     Pharynx: Posterior oropharyngeal erythema present. No oropharyngeal exudate.     Comments: Cobblestoning present on posterior oropharnyx Eyes:     General: Vision grossly intact.        Left eye: Discharge present.No foreign body or hordeolum.     Extraocular Movements: Extraocular movements intact.     Left eye: Normal extraocular motion and no nystagmus.     Conjunctiva/sclera:     Left eye: Left conjunctiva is injected. Exudate present. No chemosis or hemorrhage.    Pupils: Pupils are equal, round, and reactive to light.     Comments: Left upper eyelid swollen.  There is no tenderness noted.  No purulent discharge noted at this time.  Injection is present in the left eye  Cardiovascular:     Rate and Rhythm: Normal rate and regular rhythm.     Pulses: Normal pulses.     Heart sounds: Normal heart sounds.  Pulmonary:     Effort: Pulmonary effort is normal. No respiratory distress.     Breath sounds: Normal breath sounds. No stridor. No wheezing, rhonchi or rales.  Abdominal:     General: Bowel sounds are normal.     Palpations: Abdomen is soft.     Tenderness: There is no abdominal tenderness.  Musculoskeletal:     Cervical back: Normal range of motion.  Lymphadenopathy:     Cervical: No cervical adenopathy.  Skin:    General: Skin is warm and  dry.  Neurological:     General: No focal deficit present.     Mental Status: He is alert and oriented to person, place, and time.  Psychiatric:        Mood and Affect: Mood normal.        Behavior: Behavior normal.      UC Treatments / Results  Labs (all labs ordered are listed, but only abnormal results are displayed) Labs Reviewed - No data to display  EKG   Radiology No results found.  Procedures Procedures (including critical care time)  Medications Ordered in UC Medications - No data to display  Initial Impression / Assessment and Plan / UC Course  I have reviewed the triage vital signs and the nursing notes.  Pertinent labs & imaging results that were available during my care of the patient were reviewed by me and considered in my medical decision making (see chart for details).  The patient is well-appearing, he is in no acute distress, vital signs are stable.  Will treat patient for bacterial conjunctivitis.  Patient was treated with  Maxitrol 3 point 5-10000-0 0.1 ophthalmic suspension.  Patient was given supportive care recommendations to include warm or cool compresses as needed, strict hand hygiene, and indications of when he would need to treat the right eye if symptoms develop.  Patient was given strict indications of when follow-up with his eye doctor would be indicated.  Patient is in agreement with this plan of care and verbalizes understanding.  All questions were answered.  Patient stable for discharge.   Final Clinical Impressions(s) / UC Diagnoses   Final diagnoses:  Acute conjunctivitis of left eye, unspecified acute conjunctivitis type     Discharge Instructions      Use eyedrops as prescribed.  If you notice symptoms in the right eye, begin using medication in both eyes. If you continue to experience matting or drainage, use a warm moist cloth to clean the left eye. Cool compresses to the eyes to help with pain or swelling.  May use warm  compresses as needed to help with eye pain or discomfort. May use over-the-counter eyedrops such as Visine or Clear Eyes to help keep the eyes moist and decrease redness.   Strict handwashing when applying medication.  Avoid rubbing or manipulating the eyes while symptoms persist. If you notice any change in your vision or sudden loss of vision, please go to the emergency department immediately.   As discussed, if symptoms do not improve with this treatment, recommend following up with your eye doctor for further evaluation. Follow-up as needed.      ED Prescriptions     Medication Sig Dispense Auth. Provider   neomycin-polymyxin b-dexamethasone (MAXITROL) 3.5-10000-0.1 SUSP Place 1 drop into the left eye every 6 (six) hours for 7 days. 5 mL Verdis Koval-Warren, Sadie Haber, NP      PDMP not reviewed this encounter.   Abran Cantor, NP 03/07/23 [phone]

## 2023-03-07 NOTE — ED Triage Notes (Signed)
Pt reports left eye redness/swelling and drainage since yesterday. Pt is currently wearing glasses. Denies any known injury but reports symptoms started after mowing yard.

## 2023-03-20 NOTE — Progress Notes (Unsigned)
    Cardiology Office Note  Date: 03/21/2023   ID: Seth Savage, DOB 1951/11/09, MRN [id number]  History of Present Illness: Seth Savage is a 72 y.o. male last seen in April 2023.  He is here for a follow-up visit.  Reports occasional angina and nitroglycerin use, NYHA class II dyspnea, no palpitations or syncope.  Still working full-time.  I reviewed his medications.  He reports compliance with therapy.  We discussed increasing his Imdur from 30 mg daily to 60 mg daily.  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  He has done well on Crestor, last LDL was 46 and he has follow-up lab work pending with PCP in the next month.  ECG today shows sinus rhythm with left bundle branch block, old.  Physical Exam: VS:  BP (!) 140/78   Pulse 73   Ht 6' (1.829 m)   Wt 212 lb 3.2 oz (96.3 kg)   SpO2 98%   BMI 28.78 kg/m , BMI Body mass index is 28.78 kg/m.  Wt Readings from Last 3 Encounters:  03/21/23 212 lb 3.2 oz (96.3 kg)  01/03/23 222 lb (100.7 kg)  12/25/22 215 lb 9.6 oz (97.8 kg)    General: Patient appears comfortable at rest. HEENT: Conjunctiva and lids normal. Neck: Supple, no elevated JVP, soft bilateral carotid bruits. Lungs: Clear to auscultation, nonlabored breathing at rest. Cardiac: Regular rate and rhythm, no S3 or significant systolic murmur. Extremities: No pitting edema.  ECG:  An ECG dated 01/22/2021 was personally reviewed today and demonstrated:  Sinus rhythm with left bundle branch block.  Labwork:  April 2023: Hemoglobin 15.2, platelets 195, TSH 2.47, BUN 22, creatinine 1.14, potassium 4.4, AST 13, ALT 20, cholesterol 110, triglycerides 121, HDL 43, LDL 46  Other Studies Reviewed Today:  Carotid Dopplers 09/04/2022: Summary:  Right Carotid: Velocities in the right ICA are consistent with a 1-39%  stenosis.                Patent right endarterectomy site.   Left Carotid: Velocities in the left ICA are consistent  with a 1-39%  stenosis.               Patent left endarterectomy site.   Vertebrals:  Bilateral vertebral arteries demonstrate antegrade flow.  Subclavians: Normal flow hemodynamics were seen in bilateral subclavian               arteries.   Assessment and Plan:  1.  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  Reports occasional angina with nitroglycerin use.  Plan to refill fresh bottle of nitroglycerin and increase his Imdur to 60 mg daily.  He otherwise continues on aspirin, Norvasc, Cozaar, Toprol-XL, and Crestor.  2.  Mixed hyperlipidemia.  LDL 46 in April 2023.  He will have follow-up lab work with PCP this year.  3.  Essential hypertension.  No changes made to current regimen.  4.  Bilateral carotid artery disease status post left CEA in 2001 and right CEA in 2004.  He was seen by Dr. Arbie Cookey in February with patent bilateral CEA sites and continued medical therapy.  Follow-up carotid Dopplers from October 2023 are noted above.  Continue aspirin and statin.  Disposition:  Follow up  1 year.  Signed, Jonelle Sidle, M.D., F.A.C.C. [facility]

## 2023-03-21 ENCOUNTER — Ambulatory Visit: Payer: No Typology Code available for payment source | Attending: Cardiology | Admitting: Cardiology

## 2023-03-21 ENCOUNTER — Encounter: Payer: Self-pay | Admitting: Cardiology

## 2023-03-21 VITALS — BP 140/78 | HR 73 | Ht 72.0 in | Wt 212.2 lb

## 2023-03-21 DIAGNOSIS — I10 Essential (primary) hypertension: Secondary | ICD-10-CM | POA: Diagnosis not present

## 2023-03-21 DIAGNOSIS — E782 Mixed hyperlipidemia: Secondary | ICD-10-CM | POA: Diagnosis not present

## 2023-03-21 DIAGNOSIS — I6523 Occlusion and stenosis of bilateral carotid arteries: Secondary | ICD-10-CM | POA: Diagnosis not present

## 2023-03-21 DIAGNOSIS — I25119 Atherosclerotic heart disease of native coronary artery with unspecified angina pectoris: Secondary | ICD-10-CM

## 2023-03-21 MED ORDER — ISOSORBIDE MONONITRATE ER 60 MG PO TB24: 60.0000 mg | ORAL_TABLET | Freq: Every day | ORAL | 3 refills | Status: DC

## 2023-03-21 MED ORDER — NITROGLYCERIN 0.4 MG SL SUBL: 0.4000 mg | SUBLINGUAL_TABLET | SUBLINGUAL | 3 refills | Status: DC | PRN

## 2023-03-21 NOTE — Patient Instructions (Addendum)
Medication Instructions:  Your physician has recommended you make the following change in your medication:  Increase isosorbide mononitrate to 60 mg daily Continue other medications the same  Labwork: none  Testing/Procedures: none  Follow-Up: Your physician recommends that you schedule a follow-up appointment in: 1 year. You will receive a reminder call in the mail in about 10 months reminding you to call and schedule your appointment. If you don't receive this call, please contact our office.  Any Other Special Instructions Will Be Listed Below (If Applicable).  If you need a refill on your cardiac medications before your next appointment, please call your pharmacy.

## 2023-05-02 ENCOUNTER — Other Ambulatory Visit: Payer: Self-pay | Admitting: Cardiology

## 2023-05-03 DIAGNOSIS — Z7189 Other specified counseling: Secondary | ICD-10-CM | POA: Diagnosis not present

## 2023-05-03 DIAGNOSIS — Z87891 Personal history of nicotine dependence: Secondary | ICD-10-CM | POA: Diagnosis not present

## 2023-05-03 DIAGNOSIS — Z1331 Encounter for screening for depression: Secondary | ICD-10-CM | POA: Diagnosis not present

## 2023-05-03 DIAGNOSIS — E7800 Pure hypercholesterolemia, unspecified: Secondary | ICD-10-CM | POA: Diagnosis not present

## 2023-05-03 DIAGNOSIS — Z299 Encounter for prophylactic measures, unspecified: Secondary | ICD-10-CM | POA: Diagnosis not present

## 2023-05-03 DIAGNOSIS — Z125 Encounter for screening for malignant neoplasm of prostate: Secondary | ICD-10-CM | POA: Diagnosis not present

## 2023-05-03 DIAGNOSIS — Z1339 Encounter for screening examination for other mental health and behavioral disorders: Secondary | ICD-10-CM | POA: Diagnosis not present

## 2023-05-03 DIAGNOSIS — Z79899 Other long term (current) drug therapy: Secondary | ICD-10-CM | POA: Diagnosis not present

## 2023-05-03 DIAGNOSIS — R5383 Other fatigue: Secondary | ICD-10-CM | POA: Diagnosis not present

## 2023-05-03 DIAGNOSIS — I779 Disorder of arteries and arterioles, unspecified: Secondary | ICD-10-CM | POA: Diagnosis not present

## 2023-05-03 DIAGNOSIS — Z0000 Encounter for general adult medical examination without abnormal findings: Secondary | ICD-10-CM | POA: Diagnosis not present

## 2023-05-03 DIAGNOSIS — I10 Essential (primary) hypertension: Secondary | ICD-10-CM | POA: Diagnosis not present

## 2023-05-03 DIAGNOSIS — E039 Hypothyroidism, unspecified: Secondary | ICD-10-CM | POA: Diagnosis not present

## 2023-05-10 ENCOUNTER — Other Ambulatory Visit: Payer: Self-pay | Admitting: Cardiology

## 2023-05-21 ENCOUNTER — Telehealth: Payer: Self-pay | Admitting: Cardiology

## 2023-05-21 MED ORDER — ROSUVASTATIN CALCIUM 20 MG PO TABS: 20.0000 mg | ORAL_TABLET | Freq: Every day | ORAL | 1 refills | Status: DC

## 2023-05-21 NOTE — Telephone Encounter (Signed)
*  STAT* If patient is at the pharmacy, call can be transferred to refill team.   1. Which medications need to be refilled? (please list name of each medication and dose if known) rosuvastatin (CRESTOR) 20 MG tablet   2. Which pharmacy/location (including street and city if local pharmacy) is medication to be sent to? [facility] - [place], [place] - [address]   3. Do they need a 30 day or 90 day supply? 90

## 2023-05-21 NOTE — Telephone Encounter (Signed)
Sent to pharmacy 

## 2023-06-15 DIAGNOSIS — H6523 Chronic serous otitis media, bilateral: Secondary | ICD-10-CM | POA: Diagnosis not present

## 2023-06-15 DIAGNOSIS — J300 Vasomotor rhinitis: Secondary | ICD-10-CM | POA: Diagnosis not present

## 2023-06-15 DIAGNOSIS — R0981 Nasal congestion: Secondary | ICD-10-CM | POA: Diagnosis not present

## 2023-06-28 DIAGNOSIS — H903 Sensorineural hearing loss, bilateral: Secondary | ICD-10-CM | POA: Diagnosis not present

## 2023-07-12 DIAGNOSIS — Z87891 Personal history of nicotine dependence: Secondary | ICD-10-CM | POA: Diagnosis not present

## 2023-07-12 DIAGNOSIS — I25119 Atherosclerotic heart disease of native coronary artery with unspecified angina pectoris: Secondary | ICD-10-CM | POA: Diagnosis not present

## 2023-07-12 DIAGNOSIS — Z0000 Encounter for general adult medical examination without abnormal findings: Secondary | ICD-10-CM | POA: Diagnosis not present

## 2023-07-12 DIAGNOSIS — I10 Essential (primary) hypertension: Secondary | ICD-10-CM | POA: Diagnosis not present

## 2023-07-12 DIAGNOSIS — Z299 Encounter for prophylactic measures, unspecified: Secondary | ICD-10-CM | POA: Diagnosis not present

## 2023-07-12 DIAGNOSIS — I779 Disorder of arteries and arterioles, unspecified: Secondary | ICD-10-CM | POA: Diagnosis not present

## 2023-07-12 DIAGNOSIS — I700 Atherosclerosis of aorta: Secondary | ICD-10-CM | POA: Diagnosis not present

## 2023-09-10 DIAGNOSIS — H524 Presbyopia: Secondary | ICD-10-CM | POA: Diagnosis not present

## 2023-10-05 DIAGNOSIS — Z23 Encounter for immunization: Secondary | ICD-10-CM | POA: Diagnosis not present

## 2023-10-16 ENCOUNTER — Other Ambulatory Visit: Payer: Self-pay

## 2023-10-16 ENCOUNTER — Encounter: Payer: Self-pay | Admitting: *Deleted

## 2023-10-16 ENCOUNTER — Other Ambulatory Visit: Payer: Self-pay | Admitting: *Deleted

## 2023-10-16 ENCOUNTER — Encounter: Payer: Self-pay | Admitting: Internal Medicine

## 2023-10-16 ENCOUNTER — Ambulatory Visit (INDEPENDENT_AMBULATORY_CARE_PROVIDER_SITE_OTHER): Payer: No Typology Code available for payment source | Admitting: Internal Medicine

## 2023-10-16 VITALS — BP 143/81 | HR 62 | Temp 97.9°F | Ht 72.0 in | Wt 224.6 lb

## 2023-10-16 DIAGNOSIS — Z860100 Personal history of colon polyps, unspecified: Secondary | ICD-10-CM

## 2023-10-16 DIAGNOSIS — K529 Noninfective gastroenteritis and colitis, unspecified: Secondary | ICD-10-CM | POA: Diagnosis not present

## 2023-10-16 DIAGNOSIS — R197 Diarrhea, unspecified: Secondary | ICD-10-CM | POA: Diagnosis not present

## 2023-10-16 MED ORDER — PEG 3350-KCL-NA BICARB-NACL 420 G PO SOLR: 4000.0000 mL | Freq: Once | ORAL | 0 refills | Status: AC

## 2023-10-16 MED ORDER — DICYCLOMINE HCL 10 MG PO CAPS: 10.0000 mg | ORAL_CAPSULE | Freq: Four times a day (QID) | ORAL | 1 refills | Status: DC | PRN

## 2023-10-16 NOTE — H&P (View-Only) (Signed)
 Primary Care Physician:  Ignatius Specking, MD Primary Gastroenterologist:  Dr. Jena Gauss  Pre-Procedure History & Physical: HPI:  Seth Savage is a 72 y.o. male here for further evaluation of 20-month history of nonbloody watery diarrhea "gurgly" abdomen for 5 months multiple watery bowel movements daily.  Never passes a formed stool.  Has gained 7 pounds no nausea or vomiting.  GERD well-controlled on Protonix.  Fib four 1.0 previously normal liver enzymes historically.  No antibiotic exposure.  No new meds.  No travel.  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   Carotid artery disease (HCC)    Left CEA 2001 and right CEA 2004   Childhood asthma    COPD (chronic obstructive pulmonary disease) (HCC)    Essential hypertension    Fatty liver    GERD (gastroesophageal reflux disease)    Goiter    Radioactive iodine treatment   H pylori ulcer 11/2014   Treated with Prevpac; documented eradication by biopsy in 2018   History of bronchitis    History of kidney stones    History of pneumonia as a child    Hypothyroidism    Migraine    Mixed hyperlipidemia    Neuropathy     Past Surgical History:  Procedure Laterality Date   BIOPSY  05/01/2017   Procedure: BIOPSY;  Surgeon: Corbin Ade, MD;  Location: [facility];  Service: Endoscopy;;  gastric   BIOPSY  08/19/2018   Procedure: BIOPSY;  Surgeon: Corbin Ade, MD;  Location: [facility];  Service: Endoscopy;;  duodenal and gastric   CARDIAC CATHETERIZATION N/A 07/05/2016   Procedure: Left Heart Cath and Coronary Angiography;  Surgeon: Kathleene Hazel, MD;  Location: [facility] CV LAB;  Service: Cardiovascular;  Laterality: N/A;   CARDIAC CATHETERIZATION  03/12/2018   CAROTID ENDARTERECTOMY Left ?2009   CARPAL TUNNEL RELEASE Right    CHOLECYSTECTOMY N/A 04/24/2018   Procedure: LAPAROSCOPIC  CHOLECYSTECTOMY;  Surgeon: Franky Macho, MD;  Location: [facility];  Service: General;  Laterality: N/A;   COLECTOMY  2000   Secondary to intussuception   COLONOSCOPY  08/24/2004   Normal rectum,Small polyp at 25 cm, cold snared/ The remainder of the colonic mucosa appeared normal   COLONOSCOPY  09/24/2012   Dr. Elly Modena hemorrhoids-likely source of hematochezia.Colonic diverticulosis   COLONOSCOPY N/A 05/01/2017   Dr. Jena Gauss: Diverticulosis, next colonoscopy in 5 years.   COLONOSCOPY WITH PROPOFOL N/A 04/28/2019   Propofol; Dr. Jena Gauss; moderate, medium sized grade 1 internal hemorrhoids, diverticulosis in sigmoid and descending colon, otherwise normal.  Repeat in 5 years.   ENDARTERECTOMY Right 07/07/2016   Procedure: ENDARTERECTOMY CAROTID;  Surgeon: Larina Earthly, MD;  Location: [facility] OR;  Service: Vascular;  Laterality: Right;   ESOPHAGOGASTRODUODENOSCOPY N/A 12/09/2014   Dr. Jena Gauss: Erosive reflux esophagitis. Peptic Ulcer disease secondary to H.pylori. small hiatal hernia. Nonbleeding duodenal AVM. Duodenal erosions. TREATED WITH PREVPAC   ESOPHAGOGASTRODUODENOSCOPY N/A 03/15/2015   Dr. Jena Gauss: small hiatal hernia, healed PUD, duodenal AVM.    ESOPHAGOGASTRODUODENOSCOPY N/A 05/01/2017   Dr. Jena Gauss: LA grade a esophagitis, gastritis, no H pylori   ESOPHAGOGASTRODUODENOSCOPY (EGD) WITH PROPOFOL N/A 08/19/2018   PROPOFOL;  Surgeon: Corbin Ade, MD; normal esophagus, mild erythema and erosions in the entire stomach, small hiatal hernia, subtle white papular areas and duodenal bulb second and third portions  of the duodenum.   FRACTURE SURGERY     LEFT HEART CATH AND CORONARY ANGIOGRAPHY N/A 03/12/2018   Procedure: LEFT HEART CATH AND CORONARY ANGIOGRAPHY;  Surgeon: Kathleene Hazel, MD;  Location: [facility] INVASIVE CV LAB;  Service: Cardiovascular;  Laterality: N/A;   Multiple bilateral arm/wrist surgeries after falls     ORIF SHOULDER FRACTURE Left 06/2002   "shattered; put a bunch of metal plates  in it"   PATCH ANGIOPLASTY Right 07/07/2016   Procedure: PATCH ANGIOPLASTY USING 0.8CM X 7.6CM HEMASHIELD PATCH;  Surgeon: Larina Earthly, MD;  Location: [facility] OR;  Service: Vascular;  Laterality: Right;   SHOULDER ARTHROSCOPY W/ ROTATOR CUFF REPAIR Right 1999   SHOULDER OPEN ROTATOR CUFF REPAIR Left 1992   Metal implant     Prior to Admission medications   Medication Sig Start Date End Date Taking? Authorizing Provider  amLODipine (NORVASC) 10 MG tablet TAKE 1 TABLET BY MOUTH EVERY DAY 05/10/23  Yes Jonelle Sidle, MD  aspirin EC 81 MG tablet Take 81 mg by mouth at bedtime.    Yes [provider]  isosorbide mononitrate (IMDUR) 60 MG 24 hr tablet Take 1 tablet (60 mg total) by mouth daily. 03/21/23  Yes Jonelle Sidle, MD  levothyroxine (SYNTHROID) 175 MCG tablet Take 175 mcg by mouth daily. 01/14/21  Yes [provider]  losartan (COZAAR) 50 MG tablet TAKE 1 TABLET BY MOUTH EVERY DAY 05/02/23  Yes Jonelle Sidle, MD  metoprolol succinate (TOPROL-XL) 25 MG 24 hr tablet TAKE 1 TABLET BY MOUTH EVERY DAY 05/02/23  Yes Jonelle Sidle, MD  nitroGLYCERIN (NITROSTAT) 0.4 MG SL tablet Place 1 tablet (0.4 mg total) under the tongue every 5 (five) minutes x 3 doses as needed for chest pain (if no relief after 3rd dose, proceed to ED or call 911). 03/21/23  Yes Jonelle Sidle, MD  pantoprazole (PROTONIX) 40 MG tablet Take 40 mg by mouth daily.   Yes [provider]  rosuvastatin (CRESTOR) 20 MG tablet Take 1 tablet (20 mg total) by mouth at bedtime. 05/21/23  Yes Jonelle Sidle, MD    Allergies as of 10/16/2023 - Review Complete 10/16/2023  Allergen Reaction Noted   Lyrica [pregabalin] Shortness Of Breath 09/17/2012   Celecoxib Other (See Comments)    Codeine Itching 11/09/2014    Family History  Problem Relation Age of Onset   COPD Mother    Mental illness Mother    Hypertension Mother    Varicose Veins Mother    Heart attack Mother 47   Hypertension  Father    Alcohol abuse Father    Colon cancer Neg Hx     Social History   Socioeconomic History   Marital status: Married    Spouse name: Margie   Number of children: 0   Years of education: Ged   Highest education level: Not on file  Occupational History    Comment: Disabled  Tobacco Use   Smoking status: Former    Current packs/day: 0.00    Average packs/day: 0.2 packs/day for 45.0 years (9.0 ttl pk-yrs)    Types: Cigarettes    Start date: 12/21/1972    Quit date: 12/21/2017    Years since quitting: 5.8   Smokeless tobacco: Never  Vaping Use   Vaping status: Former  Substance and Sexual Activity   Alcohol use: Yes    Alcohol/week: 0.0 standard drinks of alcohol    Comment: 1-2 beers in 6 months.  Drug use: No   Sexual activity: Not Currently  Other Topics Concern   Not on file  Social History Narrative   Patient lives at home with his wife. [facility]).    Disabled.   Right handed.   Caffeine- two cups daily.   Three step children.   Social Determinants of Health   Financial Resource Strain: Not on file  Food Insecurity: Low Risk  (06/15/2023)   Received from [facility]   Hunger Vital Sign    Worried About Running Out of Food in the Last Year: Never true    Ran Out of Food in the Last Year: Never true  Transportation Needs: No Transportation Needs (06/15/2023)   Received from [employer]    In the past 12 months, has lack of reliable transportation kept you from medical appointments, meetings, work or from getting things needed for daily living? : No  Physical Activity: Not on file  Stress: Not on file  Social Connections: Not on file  Intimate Partner Violence: Not on file    Review of Systems: See HPI, otherwise negative ROS  Physical Exam: BP (!) 143/81 (BP Location: Right Arm, Patient Position: Sitting, Cuff Size: Large)   Pulse 62   Temp 97.9 F (36.6 C) (Oral)   Ht 6' (1.829 m)   Wt 224 lb 9.6 oz (101.9 kg)   SpO2 95%   BMI  30.46 kg/m  General:   Alert,  Well-developed, well-nourished, pleasant and cooperative in NAD Lungs:  Clear throughout to auscultation.   No wheezes, crackles, or rhonchi. No acute distress. Heart:  Regular rate and rhythm; no murmurs, clicks, rubs,  or gallops. Abdomen: Non-distended, normal bowel sounds.  Soft and nontender without appreciable mass or hepatosplenomegaly.   Impression/Plan: 72 year old gent with a history of mass old with low fibrosis score GERD and history of colonic polyps here with 52-month history of nonbloody diarrhea.  He had nonspecific dyspeptic symptoms previously celiac panel negative.  Now without now diarrhea distant history of partial colectomy for intussusception.  He has no obstructing symptoms.  Could have an infectious process.  Could have microscopic colitis, etc.   recommendations   C. difficile, GIP  We will go ahead and schedule a colonoscopy history of polyps/chronic diarrhea.  ASA 2.  Would like to get stool studies back prior to proceeding with colonoscopy.  New prescription: Bentyl 10 mg tablet take 1 orally up to 4 times daily as needed for abdominal discomfort and diarrhea -dispense 120 with 1 refill  Continue Protonix 40 mg daily  Liver ultrasound with elastography in 2025   Notice: This dictation was prepared with Dragon dictation along with smaller phrase technology. Any transcriptional errors that result from this process are unintentional and may not be corrected upon review.

## 2023-10-16 NOTE — Patient Instructions (Addendum)
It was good to see you again today!  To evaluate your diarrhea further we will do stool studies: C. difficile, GIP  We will go ahead and schedule a colonoscopy history of polyps/chronic diarrhea.  ASA 2.  Would like to get stool studies back prior to proceeding with colonoscopy.  New prescription: Bentyl 10 mg tablet take 1 orally up to 4 times daily as needed for abdominal discomfort and diarrhea -dispense 120 with 1 refill  Continue Protonix 40 mg daily  Liver ultrasound with elastography in 2025  Further recommendations to follow.

## 2023-10-16 NOTE — Progress Notes (Signed)
Primary Care Physician:  Ignatius Specking, MD Primary Gastroenterologist:  Dr. Jena Gauss  Pre-Procedure History & Physical: HPI:  Seth Savage is a 72 y.o. male here for further evaluation of 20-month history of nonbloody watery diarrhea "gurgly" abdomen for 5 months multiple watery bowel movements daily.  Never passes a formed stool.  Has gained 7 pounds no nausea or vomiting.  GERD well-controlled on Protonix.  Fib four 1.0 previously normal liver enzymes historically.  No antibiotic exposure.  No new meds.  No travel.  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   Carotid artery disease (HCC)    Left CEA 2001 and right CEA 2004   Childhood asthma    COPD (chronic obstructive pulmonary disease) (HCC)    Essential hypertension    Fatty liver    GERD (gastroesophageal reflux disease)    Goiter    Radioactive iodine treatment   H pylori ulcer 11/2014   Treated with Prevpac; documented eradication by biopsy in 2018   History of bronchitis    History of kidney stones    History of pneumonia as a child    Hypothyroidism    Migraine    Mixed hyperlipidemia    Neuropathy     Past Surgical History:  Procedure Laterality Date   BIOPSY  05/01/2017   Procedure: BIOPSY;  Surgeon: Corbin Ade, MD;  Location: [facility];  Service: Endoscopy;;  gastric   BIOPSY  08/19/2018   Procedure: BIOPSY;  Surgeon: Corbin Ade, MD;  Location: [facility];  Service: Endoscopy;;  duodenal and gastric   CARDIAC CATHETERIZATION N/A 07/05/2016   Procedure: Left Heart Cath and Coronary Angiography;  Surgeon: Kathleene Hazel, MD;  Location: [facility] CV LAB;  Service: Cardiovascular;  Laterality: N/A;   CARDIAC CATHETERIZATION  03/12/2018   CAROTID ENDARTERECTOMY Left ?2009   CARPAL TUNNEL RELEASE Right    CHOLECYSTECTOMY N/A 04/24/2018   Procedure: LAPAROSCOPIC  CHOLECYSTECTOMY;  Surgeon: Franky Macho, MD;  Location: [facility];  Service: General;  Laterality: N/A;   COLECTOMY  2000   Secondary to intussuception   COLONOSCOPY  08/24/2004   Normal rectum,Small polyp at 25 cm, cold snared/ The remainder of the colonic mucosa appeared normal   COLONOSCOPY  09/24/2012   Dr. Elly Modena hemorrhoids-likely source of hematochezia.Colonic diverticulosis   COLONOSCOPY N/A 05/01/2017   Dr. Jena Gauss: Diverticulosis, next colonoscopy in 5 years.   COLONOSCOPY WITH PROPOFOL N/A 04/28/2019   Propofol; Dr. Jena Gauss; moderate, medium sized grade 1 internal hemorrhoids, diverticulosis in sigmoid and descending colon, otherwise normal.  Repeat in 5 years.   ENDARTERECTOMY Right 07/07/2016   Procedure: ENDARTERECTOMY CAROTID;  Surgeon: Larina Earthly, MD;  Location: [facility] OR;  Service: Vascular;  Laterality: Right;   ESOPHAGOGASTRODUODENOSCOPY N/A 12/09/2014   Dr. Jena Gauss: Erosive reflux esophagitis. Peptic Ulcer disease secondary to H.pylori. small hiatal hernia. Nonbleeding duodenal AVM. Duodenal erosions. TREATED WITH PREVPAC   ESOPHAGOGASTRODUODENOSCOPY N/A 03/15/2015   Dr. Jena Gauss: small hiatal hernia, healed PUD, duodenal AVM.    ESOPHAGOGASTRODUODENOSCOPY N/A 05/01/2017   Dr. Jena Gauss: LA grade a esophagitis, gastritis, no H pylori   ESOPHAGOGASTRODUODENOSCOPY (EGD) WITH PROPOFOL N/A 08/19/2018   PROPOFOL;  Surgeon: Corbin Ade, MD; normal esophagus, mild erythema and erosions in the entire stomach, small hiatal hernia, subtle white papular areas and duodenal bulb second and third portions  of the duodenum.   FRACTURE SURGERY     LEFT HEART CATH AND CORONARY ANGIOGRAPHY N/A 03/12/2018   Procedure: LEFT HEART CATH AND CORONARY ANGIOGRAPHY;  Surgeon: Kathleene Hazel, MD;  Location: [facility] LAB;  Service: Cardiovascular;  Laterality: N/A;   Multiple bilateral arm/wrist surgeries after falls     ORIF SHOULDER FRACTURE Left 06/2002   "shattered; put a bunch of metal plates  in it"   PATCH ANGIOPLASTY Right 07/07/2016   Procedure: PATCH ANGIOPLASTY USING 0.8CM X 7.6CM HEMASHIELD PATCH;  Surgeon: Larina Earthly, MD;  Location: [facility] OR;  Service: Vascular;  Laterality: Right;   SHOULDER ARTHROSCOPY W/ ROTATOR CUFF REPAIR Right 1999   SHOULDER OPEN ROTATOR CUFF REPAIR Left 1992   Metal implant     Prior to Admission medications   Medication Sig Start Date End Date Taking? Authorizing Provider  amLODipine (NORVASC) 10 MG tablet TAKE 1 TABLET BY MOUTH EVERY DAY 05/10/23  Yes Jonelle Sidle, MD  aspirin EC 81 MG tablet Take 81 mg by mouth at bedtime.    Yes [provider]  isosorbide mononitrate (IMDUR) 60 MG 24 hr tablet Take 1 tablet (60 mg total) by mouth daily. 03/21/23  Yes Jonelle Sidle, MD  levothyroxine (SYNTHROID) 175 MCG tablet Take 175 mcg by mouth daily. 01/14/21  Yes [provider]  losartan (COZAAR) 50 MG tablet TAKE 1 TABLET BY MOUTH EVERY DAY 05/02/23  Yes Jonelle Sidle, MD  metoprolol succinate (TOPROL-XL) 25 MG 24 hr tablet TAKE 1 TABLET BY MOUTH EVERY DAY 05/02/23  Yes Jonelle Sidle, MD  nitroGLYCERIN (NITROSTAT) 0.4 MG SL tablet Place 1 tablet (0.4 mg total) under the tongue every 5 (five) minutes x 3 doses as needed for chest pain (if no relief after 3rd dose, proceed to ED or call 911). 03/21/23  Yes Jonelle Sidle, MD  pantoprazole (PROTONIX) 40 MG tablet Take 40 mg by mouth daily.   Yes [provider]  rosuvastatin (CRESTOR) 20 MG tablet Take 1 tablet (20 mg total) by mouth at bedtime. 05/21/23  Yes Jonelle Sidle, MD    Allergies as of 10/16/2023 - Review Complete 10/16/2023  Allergen Reaction Noted   Lyrica [pregabalin] Shortness Of Breath 09/17/2012   Celecoxib Other (See Comments)    Codeine Itching 11/09/2014    Family History  Problem Relation Age of Onset   COPD Mother    Mental illness Mother    Hypertension Mother    Varicose Veins Mother    Heart attack Mother 47   Hypertension  Father    Alcohol abuse Father    Colon cancer Neg Hx     Social History   Socioeconomic History   Marital status: Married    Spouse name: Margie   Number of children: 0   Years of education: Ged   Highest education level: Not on file  Occupational History    Comment: Disabled  Tobacco Use   Smoking status: Former    Current packs/day: 0.00    Average packs/day: 0.2 packs/day for 45.0 years (9.0 ttl pk-yrs)    Types: Cigarettes    Start date: 12/21/1972    Quit date: 12/21/2017    Years since quitting: 5.8   Smokeless tobacco: Never  Vaping Use   Vaping status: Former  Substance and Sexual Activity   Alcohol use: Yes    Alcohol/week: 0.0 standard drinks of alcohol    Comment: 1-2 beers in 6 months.  Drug use: No   Sexual activity: Not Currently  Other Topics Concern   Not on file  Social History Narrative   Patient lives at home with his wife. [facility]).    Disabled.   Right handed.   Caffeine- two cups daily.   Three step children.   Social Determinants of Health   Financial Resource Strain: Not on file  Food Insecurity: Low Risk  (06/15/2023)   Received from [facility]   Hunger Vital Sign    Worried About Running Out of Food in the Last Year: Never true    Ran Out of Food in the Last Year: Never true  Transportation Needs: No Transportation Needs (06/15/2023)   Received from [employer]    In the past 12 months, has lack of reliable transportation kept you from medical appointments, meetings, work or from getting things needed for daily living? : No  Physical Activity: Not on file  Stress: Not on file  Social Connections: Not on file  Intimate Partner Violence: Not on file    Review of Systems: See HPI, otherwise negative ROS  Physical Exam: BP (!) 143/81 (BP Location: Right Arm, Patient Position: Sitting, Cuff Size: Large)   Pulse 62   Temp 97.9 F (36.6 C) (Oral)   Ht 6' (1.829 m)   Wt 224 lb 9.6 oz (101.9 kg)   SpO2 95%   BMI  30.46 kg/m  General:   Alert,  Well-developed, well-nourished, pleasant and cooperative in NAD Lungs:  Clear throughout to auscultation.   No wheezes, crackles, or rhonchi. No acute distress. Heart:  Regular rate and rhythm; no murmurs, clicks, rubs,  or gallops. Abdomen: Non-distended, normal bowel sounds.  Soft and nontender without appreciable mass or hepatosplenomegaly.   Impression/Plan: 72 year old gent with a history of mass old with low fibrosis score GERD and history of colonic polyps here with 52-month history of nonbloody diarrhea.  He had nonspecific dyspeptic symptoms previously celiac panel negative.  Now without now diarrhea distant history of partial colectomy for intussusception.  He has no obstructing symptoms.  Could have an infectious process.  Could have microscopic colitis, etc.   recommendations   C. difficile, GIP  We will go ahead and schedule a colonoscopy history of polyps/chronic diarrhea.  ASA 2.  Would like to get stool studies back prior to proceeding with colonoscopy.  New prescription: Bentyl 10 mg tablet take 1 orally up to 4 times daily as needed for abdominal discomfort and diarrhea -dispense 120 with 1 refill  Continue Protonix 40 mg daily  Liver ultrasound with elastography in 2025   Notice: This dictation was prepared with Dragon dictation along with smaller phrase technology. Any transcriptional errors that result from this process are unintentional and may not be corrected upon review.

## 2023-10-17 LAB — CLOSTRIDIUM DIFFICILE BY PCR: Toxigenic C. Difficile by PCR: NEGATIVE

## 2023-10-18 LAB — GI PROFILE, STOOL, PCR

## 2023-11-04 ENCOUNTER — Other Ambulatory Visit: Payer: Self-pay | Admitting: Cardiology

## 2023-11-08 ENCOUNTER — Ambulatory Visit (HOSPITAL_COMMUNITY): Payer: No Typology Code available for payment source | Admitting: Anesthesiology

## 2023-11-08 ENCOUNTER — Encounter (HOSPITAL_COMMUNITY): Payer: Self-pay | Admitting: Internal Medicine

## 2023-11-08 ENCOUNTER — Other Ambulatory Visit: Payer: Self-pay

## 2023-11-08 ENCOUNTER — Encounter (HOSPITAL_COMMUNITY)
Admission: RE | Disposition: A | Discharge status: Home / Self Care | Payer: Self-pay | Source: Home / Self Care | Attending: Internal Medicine

## 2023-11-08 ENCOUNTER — Ambulatory Visit (HOSPITAL_COMMUNITY)
Admission: RE | Admit: 2023-11-08 | Discharge: 2023-11-08 | Disposition: A | Discharge status: Home / Self Care | Payer: No Typology Code available for payment source | Attending: Internal Medicine | Admitting: Internal Medicine

## 2023-11-08 DIAGNOSIS — I25119 Atherosclerotic heart disease of native coronary artery with unspecified angina pectoris: Secondary | ICD-10-CM | POA: Diagnosis not present

## 2023-11-08 DIAGNOSIS — K6389 Other specified diseases of intestine: Secondary | ICD-10-CM

## 2023-11-08 DIAGNOSIS — R197 Diarrhea, unspecified: Secondary | ICD-10-CM | POA: Diagnosis not present

## 2023-11-08 DIAGNOSIS — I10 Essential (primary) hypertension: Secondary | ICD-10-CM | POA: Insufficient documentation

## 2023-11-08 DIAGNOSIS — J4489 Other specified chronic obstructive pulmonary disease: Secondary | ICD-10-CM | POA: Insufficient documentation

## 2023-11-08 DIAGNOSIS — Z87891 Personal history of nicotine dependence: Secondary | ICD-10-CM | POA: Diagnosis not present

## 2023-11-08 DIAGNOSIS — K5730 Diverticulosis of large intestine without perforation or abscess without bleeding: Secondary | ICD-10-CM | POA: Diagnosis not present

## 2023-11-08 DIAGNOSIS — K760 Fatty (change of) liver, not elsewhere classified: Secondary | ICD-10-CM | POA: Insufficient documentation

## 2023-11-08 DIAGNOSIS — K529 Noninfective gastroenteritis and colitis, unspecified: Secondary | ICD-10-CM | POA: Insufficient documentation

## 2023-11-08 DIAGNOSIS — K219 Gastro-esophageal reflux disease without esophagitis: Secondary | ICD-10-CM | POA: Diagnosis not present

## 2023-11-08 DIAGNOSIS — I2510 Atherosclerotic heart disease of native coronary artery without angina pectoris: Secondary | ICD-10-CM | POA: Diagnosis not present

## 2023-11-08 DIAGNOSIS — J449 Chronic obstructive pulmonary disease, unspecified: Secondary | ICD-10-CM | POA: Diagnosis not present

## 2023-11-08 DIAGNOSIS — Z860100 Personal history of colon polyps, unspecified: Secondary | ICD-10-CM

## 2023-11-08 HISTORY — PX: COLONOSCOPY WITH PROPOFOL: SHX5780

## 2023-11-08 HISTORY — PX: BIOPSY: SHX5522

## 2023-11-08 SURGERY — COLONOSCOPY WITH PROPOFOL
Anesthesia: General

## 2023-11-08 MED ORDER — PHENYLEPHRINE HCL (PRESSORS) 10 MG/ML IV SOLN: INTRAVENOUS | Status: DC | PRN

## 2023-11-08 MED ORDER — LACTATED RINGERS IV SOLN: INTRAVENOUS | Status: DC

## 2023-11-08 MED ORDER — LIDOCAINE HCL (CARDIAC) PF 100 MG/5ML IV SOSY
PREFILLED_SYRINGE | INTRAVENOUS | Status: DC | PRN
  Administered 2023-11-08: 100 mg via INTRAVENOUS

## 2023-11-08 MED ORDER — STERILE WATER FOR IRRIGATION IR SOLN
Status: DC | PRN
  Administered 2023-11-08: 60 mL

## 2023-11-08 MED ORDER — PROPOFOL 500 MG/50ML IV EMUL
INTRAVENOUS | Status: AC
  Filled 2023-11-08: qty 50

## 2023-11-08 MED ORDER — PHENYLEPHRINE 80 MCG/ML (10ML) SYRINGE FOR IV PUSH (FOR BLOOD PRESSURE SUPPORT)
PREFILLED_SYRINGE | INTRAVENOUS | Status: AC
  Filled 2023-11-08: qty 30

## 2023-11-08 MED ORDER — PROPOFOL 10 MG/ML IV BOLUS
INTRAVENOUS | Status: DC | PRN
  Administered 2023-11-08: 100 ug/kg/min via INTRAVENOUS

## 2023-11-08 MED ORDER — PHENYLEPHRINE 80 MCG/ML (10ML) SYRINGE FOR IV PUSH (FOR BLOOD PRESSURE SUPPORT)
PREFILLED_SYRINGE | INTRAVENOUS | Status: DC | PRN
  Administered 2023-11-08: 80 ug via INTRAVENOUS

## 2023-11-08 MED ORDER — LIDOCAINE HCL (PF) 2 % IJ SOLN
INTRAMUSCULAR | Status: AC
  Filled 2023-11-08: qty 5

## 2023-11-08 NOTE — Anesthesia Preprocedure Evaluation (Addendum)
Anesthesia Evaluation  Patient identified by MRN, date of birth, ID band Patient awake    Reviewed: Allergy & Precautions, H&P , NPO status , Patient's Chart, lab work & pertinent test results, reviewed documented beta blocker date and time   Airway Mallampati: II  TM Distance: >3 FB Neck ROM: full    Dental no notable dental hx.    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  No issues since, takes daily ntg.  Reproductive/Obstetrics negative OB ROS                              Anesthesia Physical Anesthesia Plan  ASA: 3  Anesthesia Plan: General   Post-op Pain Management:    Induction:   PONV Risk Score and Plan: Propofol infusion  Airway Management Planned:   Additional Equipment:   Intra-op Plan:   Post-operative Plan:   Informed Consent: I have reviewed the patients History and Physical, chart, labs and discussed the procedure including the risks, benefits and alternatives for the proposed anesthesia with the patient or authorized representative who has indicated his/her understanding and acceptance.     Dental Advisory Given  Plan Discussed with: CRNA  Anesthesia Plan Comments:          Anesthesia Quick Evaluation

## 2023-11-08 NOTE — Discharge Instructions (Signed)
  Colonoscopy Discharge Instructions  Read the instructions outlined below and refer to this sheet in the next few weeks. These discharge instructions provide you with general information on caring for yourself after you leave the hospital. Your doctor may also give you specific instructions. While your treatment has been planned according to the most current medical practices available, unavoidable complications occasionally occur. If you have any problems or questions after discharge, call Dr. Jena Gauss at [phone]. ACTIVITY You may resume your regular activity, but move at a slower pace for the next 24 hours.  Take frequent rest periods for the next 24 hours.  Walking will help get rid of the air and reduce the bloated feeling in your belly (abdomen).  No driving for 24 hours (because of the medicine (anesthesia) used during the test).   Do not sign any important legal documents or operate any machinery for 24 hours (because of the anesthesia used during the test).  NUTRITION Drink plenty of fluids.  You may resume your normal diet as instructed by your doctor.  Begin with a light meal and progress to your normal diet. Heavy or fried foods are harder to digest and may make you feel sick to your stomach (nauseated).  Avoid alcoholic beverages for 24 hours or as instructed.  MEDICATIONS You may resume your normal medications unless your doctor tells you otherwise.  WHAT YOU CAN EXPECT TODAY Some feelings of bloating in the abdomen.  Passage of more gas than usual.  Spotting of blood in your stool or on the toilet paper.  IF YOU HAD POLYPS REMOVED DURING THE COLONOSCOPY: No aspirin products for 7 days or as instructed.  No alcohol for 7 days or as instructed.  Eat a soft diet for the next 24 hours.  FINDING OUT THE RESULTS OF YOUR TEST Not all test results are available during your visit. If your test results are not back during the visit, make an appointment with your caregiver to find out the  results. Do not assume everything is normal if you have not heard from your caregiver or the medical facility. It is important for you to follow up on all of your test results.  SEEK IMMEDIATE MEDICAL ATTENTION IF: You have more than a spotting of blood in your stool.  Your belly is swollen (abdominal distention).  You are nauseated or vomiting.  You have a temperature over 101.  You have abdominal pain or discomfort that is severe or gets worse throughout the day.    No polyps found today  Your colon was biopsied.  You do have diverticulosis (diverticulosis information provided)  Further recommendations to follow once the pathology report returns for review.  At patient request, I called Brittain Mormon at [phone] findings and recommendations

## 2023-11-08 NOTE — Transfer of Care (Signed)
Immediate Anesthesia Transfer of Care Note  Patient: Seth Savage  Procedure(s) Performed: COLONOSCOPY WITH PROPOFOL BIOPSY  Patient Location: Endoscopy Unit  Anesthesia Type:General  Level of Consciousness: awake, alert , and oriented  Airway & Oxygen Therapy: Patient Spontanous Breathing  Post-op Assessment: Report given to RN and Post -op Vital signs reviewed and stable  Post vital signs: Reviewed and stable  Last Vitals:  Vitals Value Taken Time  BP    Temp    Pulse    Resp    SpO2      Last Pain:  Vitals:   11/08/23 1324  TempSrc:   PainSc: 0-No pain      Patients Stated Pain Goal: 8 (11/08/23 1114)  Complications: No notable events documented.

## 2023-11-08 NOTE — Op Note (Signed)
[facility] Patient Name: Seth Savage Procedure Date: 11/08/2023 1:20 PM MRN: [id number] Date of Birth: 14-Oct-1951 Attending MD: Gennette Pac , MD, [id number] CSN: [id number] Age: 72 Admit Type: Outpatient Procedure:                Colonoscopy Indications:              Chronic diarrhea Providers:                Gennette Pac, MD, Angelica Ran, Pandora Leiter, Technician Referring MD:              Medicines:                Propofol per Anesthesia Complications:            No immediate complications. Estimated Blood Loss:     Estimated blood loss was minimal. Procedure:                Pre-Anesthesia Assessment:                           - Prior to the procedure, a History and Physical                            was performed, and patient medications and                            allergies were reviewed. The patient's tolerance of                            previous anesthesia was also reviewed. The risks                            and benefits of the procedure and the sedation                            options and risks were discussed with the patient.                            All questions were answered, and informed consent                            was obtained. Prior Anticoagulants: The patient has                            taken no anticoagulant or antiplatelet agents. ASA                            Grade Assessment: II - A patient with mild systemic                            disease. After reviewing the risks and benefits,  the patient was deemed in satisfactory condition to                            undergo the procedure.                           After obtaining informed consent, the colonoscope                            was passed under direct vision. Throughout the                            procedure, the patient's blood pressure, pulse, and                            oxygen saturations were  monitored continuously. The                            [id number]) scope was introduced through the                            anus and advanced to the 5 cm into the ileum. The                            colonoscopy was performed without difficulty. The                            patient tolerated the procedure well. The quality                            of the bowel preparation was adequate. The                            ileocecal valve, appendiceal orifice, and rectum                            were photographed. Scope In: 1:29:02 PM Scope Out: 1:47:17 PM Scope Withdrawal Time: 0 hours 15 minutes 21 seconds  Total Procedure Duration: 0 hours 18 minutes 15 seconds  Findings:      The perianal and digital rectal examinations were normal.      Scattered large-mouthed and small-mouthed diverticula were found in the       sigmoid colon and descending colon. 10 cm band of submucosal petechiae       involving the distal sigmoid segment. The remainder the colonic mucosa       appeared normal.      The exam was otherwise normal throughout the examined colon. Distal 5 cm       of terminal ileum appeared      The retroflexed view of the distal rectum and anal verge was normal and       showed no anal or rectal abnormalities. Biopsies of the left and right       colon were taken for histologic study the area of submucosal petechiae       distal sigmoid were biopsied separately. Impression:               -  Diverticulosis in the sigmoid colon and in the                            descending colon. Sigmoid diverticula. Segmental                            biopsies taken.                           - The distal rectum and anal verge are normal on                            retroflexion view. Normal-appearing terminal ileum.                           - Moderate Sedation:      Moderate (conscious) sedation was personally administered by an       anesthesia professional. The following  parameters were monitored: oxygen       saturation, heart rate, blood pressure, respiratory rate, EKG, adequacy       of pulmonary ventilation, and response to care. Recommendation:           - Patient has a contact number available for                            emergencies. The signs and symptoms of potential                            delayed complications were discussed with the                            patient. Return to normal activities tomorrow.                            Written discharge instructions were provided to the                            patient.                           - Advance diet as tolerated. Follow-up on                            pathology. Office visit with [place] in 6 weeks. Procedure Code(s):        --- Professional ---                           [phone], Colonoscopy, flexible; diagnostic, including                            collection of specimen(s) by brushing or washing,                            when performed (separate procedure) Diagnosis Code(s):        --- Professional ---  K52.9, Noninfective gastroenteritis and colitis,                            unspecified                           K57.30, Diverticulosis of large intestine without                            perforation or abscess without bleeding CPT copyright 2022 American [employer] Association. All rights reserved. The codes documented in this report are preliminary and upon coder review may  be revised to meet current compliance requirements. Gerrit Friends. Anarely Nicholls, MD Gennette Pac, MD 11/08/2023 1:57:45 PM This report has been signed electronically. Number of Addenda: 0

## 2023-11-08 NOTE — Interval H&P Note (Signed)
History and Physical Interval Note:  11/08/2023 1:03 PM  Purvis Sheffield  has presented today for surgery, with the diagnosis of history of polyps,diarrhea.  The various methods of treatment have been discussed with the patient and family. After consideration of risks, benefits and other options for treatment, the patient has consented to  Procedure(s) with comments: COLONOSCOPY WITH PROPOFOL (N/A) - 12:45 pm .asa 2 as a surgical intervention.  The patient's history has been reviewed, patient examined, no change in status, stable for surgery.  I have reviewed the patient's chart and labs.  Questions were answered to the patient's satisfaction.     Eula Listen  Prior cardiac issues discussed at length with anesthesia.  No change in patient's condition.  Colonoscopy  today per plan.  The risks, benefits, limitations, alternatives and imponderables have been reviewed with the patient. Questions have been answered. All parties are agreeable.

## 2023-11-09 LAB — SURGICAL PATHOLOGY

## 2023-11-13 NOTE — Anesthesia Postprocedure Evaluation (Signed)
Anesthesia Post Note  Patient: Seth Savage  Procedure(s) Performed: COLONOSCOPY WITH PROPOFOL BIOPSY  Patient location during evaluation: Phase II Anesthesia Type: General Level of consciousness: awake Pain management: pain level controlled Vital Signs Assessment: post-procedure vital signs reviewed and stable Respiratory status: spontaneous breathing and respiratory function stable Cardiovascular status: blood pressure returned to baseline and stable Postop Assessment: no headache and no apparent nausea or vomiting Anesthetic complications: no Comments: Late entry   No notable events documented.   Last Vitals:  Vitals:   11/08/23 1114 11/08/23 1353  BP: (!) 144/72 (!) 101/48  Pulse: 80 64  Resp: 20 16  Temp: 36.6 C 36.4 C  SpO2: 94% 97%    Last Pain:  Vitals:   11/08/23 1353  TempSrc: Oral  PainSc: 0-No pain                 Windell Norfolk

## 2023-11-19 ENCOUNTER — Other Ambulatory Visit: Payer: Self-pay

## 2023-11-19 MED ORDER — MESALAMINE 1.2 G PO TBEC: 4.8000 g | DELAYED_RELEASE_TABLET | Freq: Every day | ORAL | 0 refills | Status: DC

## 2023-11-29 ENCOUNTER — Encounter (HOSPITAL_COMMUNITY): Payer: Self-pay | Admitting: Internal Medicine

## 2023-12-13 DIAGNOSIS — R197 Diarrhea, unspecified: Secondary | ICD-10-CM | POA: Diagnosis not present

## 2023-12-13 DIAGNOSIS — R0981 Nasal congestion: Secondary | ICD-10-CM | POA: Diagnosis not present

## 2023-12-13 DIAGNOSIS — J069 Acute upper respiratory infection, unspecified: Secondary | ICD-10-CM | POA: Diagnosis not present

## 2023-12-13 DIAGNOSIS — Z299 Encounter for prophylactic measures, unspecified: Secondary | ICD-10-CM | POA: Diagnosis not present

## 2023-12-13 DIAGNOSIS — R112 Nausea with vomiting, unspecified: Secondary | ICD-10-CM | POA: Diagnosis not present

## 2023-12-17 DIAGNOSIS — F3342 Major depressive disorder, recurrent, in full remission: Secondary | ICD-10-CM | POA: Diagnosis not present

## 2023-12-17 DIAGNOSIS — I10 Essential (primary) hypertension: Secondary | ICD-10-CM | POA: Diagnosis not present

## 2023-12-17 DIAGNOSIS — Z299 Encounter for prophylactic measures, unspecified: Secondary | ICD-10-CM | POA: Diagnosis not present

## 2023-12-17 DIAGNOSIS — D692 Other nonthrombocytopenic purpura: Secondary | ICD-10-CM | POA: Diagnosis not present

## 2023-12-17 DIAGNOSIS — I779 Disorder of arteries and arterioles, unspecified: Secondary | ICD-10-CM | POA: Diagnosis not present

## 2023-12-17 DIAGNOSIS — I700 Atherosclerosis of aorta: Secondary | ICD-10-CM | POA: Diagnosis not present

## 2023-12-18 NOTE — Progress Notes (Unsigned)
Referring Provider: Ignatius Specking, MD Primary Care Physician:  Ignatius Specking, MD Primary GI Physician: Dr. Jena Gauss  No chief complaint on file.   HPI:   Seth Savage is a 73 y.o. male with history of fatty liver, GERD, PUD secondary to H. pylori s/p documented eradication by biopsies in 2018, chronic RUQ abdominal pain predating cholecystectomy in June 2019 s/p CT A/P with contrast July 2019 with small post op fluid collection, no other significant findings, EGD in September 2019 with subtle abnormalities of stomach and duodenum of doubtful clinical significance (pathology benign), and RUQ [place] with no significant findings. Celiac screen negative in 2022.    Also with partial colectomy for intussusception in 2000, colon polyps, rectal bleeding in the setting of hemorrhoids, and chronic loose stool following cholecystectomy.     Patient is presenting today for follow-up of diarrhea.  He was last seen in our office 10/16/2023 reporting 5 months of nonbloody, watery diarrhea with multiple bowel movements daily.  Stool studies were ordered and he was scheduled for colonoscopy.  Also prescribed dicyclomine up to 4 times a day.  C. difficile and GI pathogen panel were negative.  Colonoscopy 11/08/2023 with sigmoid and descending colon diverticulosis, 10 cm band of submucosal petechiae involving the distal sigmoid segment.  Ascending colon, descending colon biopsies unremarkable.  Sigmoid colon biopsy showed colonic mucosa with mild, focal hyperplastic changes.  No inflammation.  Dr. Jena Gauss queried element of segmental colitis associated with diverticulosis.  Recommended starting Lialda 4.8 g daily x 30 days and follow-up in the office in 4-6 weeks.    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    Carotid artery disease (HCC)    Left CEA 2001 and right CEA 2004   Childhood asthma    COPD (chronic obstructive pulmonary disease) (HCC)    Essential hypertension    Fatty liver    GERD (gastroesophageal reflux disease)    Goiter    Radioactive iodine treatment   H pylori ulcer 11/2014   Treated with Prevpac; documented eradication by biopsy in 2018   History of bronchitis    History of kidney stones    History of pneumonia as a child    Hypothyroidism    Migraine    Mixed hyperlipidemia    Neuropathy     Past Surgical History:  Procedure Laterality Date   BIOPSY  05/01/2017   Procedure: BIOPSY;  Surgeon: Corbin Ade, MD;  Location: [facility];  Service: Endoscopy;;  gastric   BIOPSY  08/19/2018   Procedure: BIOPSY;  Surgeon: Corbin Ade, MD;  Location: [facility];  Service: Endoscopy;;  duodenal and gastric   BIOPSY  11/08/2023   Procedure: BIOPSY;  Surgeon: Corbin Ade, MD;  Location: [facility];  Service: Endoscopy;;   CARDIAC CATHETERIZATION N/A 07/05/2016   Procedure: Left Heart Cath and Coronary Angiography;  Surgeon: Kathleene Hazel, MD;  Location: [facility] INVASIVE CV LAB;  Service: Cardiovascular;  Laterality: N/A;   CARDIAC CATHETERIZATION  03/12/2018   CAROTID ENDARTERECTOMY Left ?2009   CARPAL TUNNEL RELEASE Right    CHOLECYSTECTOMY N/A 04/24/2018   Procedure: LAPAROSCOPIC CHOLECYSTECTOMY;  Surgeon: Franky Macho, MD;  Location: [facility] ORS;  Service: General;  Laterality: N/A;   COLECTOMY  2000   Secondary to intussuception   COLONOSCOPY  08/24/2004   Normal rectum,Small polyp at 25 cm, cold snared/ The remainder of the colonic mucosa appeared normal   COLONOSCOPY  09/24/2012   Dr. Elly Modena hemorrhoids-likely source of hematochezia.Colonic diverticulosis   COLONOSCOPY N/A 05/01/2017   Dr. Jena Gauss: Diverticulosis, next colonoscopy in 5 years.   COLONOSCOPY WITH PROPOFOL N/A 04/28/2019   Propofol; Dr. Jena Gauss; moderate, medium sized grade 1 internal  hemorrhoids, diverticulosis in sigmoid and descending colon, otherwise normal.  Repeat in 5 years.   COLONOSCOPY WITH PROPOFOL N/A 11/08/2023   Procedure: COLONOSCOPY WITH PROPOFOL;  Surgeon: Corbin Ade, MD;  Location: [facility];  Service: Endoscopy;  Laterality: N/A;  12:45 pm .asa 2   ENDARTERECTOMY Right 07/07/2016   Procedure: ENDARTERECTOMY CAROTID;  Surgeon: Larina Earthly, MD;  Location: [facility] OR;  Service: Vascular;  Laterality: Right;   ESOPHAGOGASTRODUODENOSCOPY N/A 12/09/2014   Dr. Jena Gauss: Erosive reflux esophagitis. Peptic Ulcer disease secondary to H.pylori. small hiatal hernia. Nonbleeding duodenal AVM. Duodenal erosions. TREATED WITH PREVPAC   ESOPHAGOGASTRODUODENOSCOPY N/A 03/15/2015   Dr. Jena Gauss: small hiatal hernia, healed PUD, duodenal AVM.    ESOPHAGOGASTRODUODENOSCOPY N/A 05/01/2017   Dr. Jena Gauss: LA grade a esophagitis, gastritis, no H pylori   ESOPHAGOGASTRODUODENOSCOPY (EGD) WITH PROPOFOL N/A 08/19/2018   PROPOFOL;  Surgeon: Corbin Ade, MD; normal esophagus, mild erythema and erosions in the entire stomach, small hiatal hernia, subtle white papular areas and duodenal bulb second and third portions of the duodenum.   FRACTURE SURGERY     LEFT HEART CATH AND CORONARY ANGIOGRAPHY N/A 03/12/2018   Procedure: LEFT HEART CATH AND CORONARY ANGIOGRAPHY;  Surgeon: Kathleene Hazel, MD;  Location: [facility] INVASIVE CV LAB;  Service: Cardiovascular;  Laterality: N/A;   Multiple bilateral arm/wrist surgeries after falls     ORIF SHOULDER FRACTURE Left 06/2002   "shattered; put a bunch of metal plates in it"   PATCH ANGIOPLASTY Right 07/07/2016   Procedure: PATCH ANGIOPLASTY USING 0.8CM X 7.6CM HEMASHIELD PATCH;  Surgeon: Larina Earthly, MD;  Location: [facility] OR;  Service: Vascular;  Laterality: Right;   SHOULDER ARTHROSCOPY W/ ROTATOR CUFF REPAIR Right 1999   SHOULDER OPEN ROTATOR CUFF REPAIR Left 1992   Metal implant     Current Outpatient Medications  Medication Sig Dispense Refill    amLODipine (NORVASC) 10 MG tablet TAKE 1 TABLET BY MOUTH EVERY DAY 90 tablet 3   aspirin EC 81 MG tablet Take 81 mg by mouth at bedtime.      dicyclomine (BENTYL) 10 MG capsule Take 1 capsule (10 mg total) by mouth 4 (four) times daily as needed for spasms. 120 capsule 1   isosorbide mononitrate (IMDUR) 60 MG 24 hr tablet Take 1 tablet (60 mg total) by mouth daily. 90 tablet 3   levothyroxine (SYNTHROID) 175 MCG tablet Take 175 mcg by mouth daily.     losartan (COZAAR) 50 MG tablet TAKE 1 TABLET BY MOUTH EVERY DAY 90 tablet 1   mesalamine (LIALDA) 1.2 g EC tablet Take 4 tablets (4.8 g total) by mouth daily with breakfast. 120 tablet 0   metoprolol succinate (TOPROL-XL) 25 MG 24 hr tablet TAKE 1 TABLET BY MOUTH EVERY DAY 90 tablet 1   nitroGLYCERIN (NITROSTAT) 0.4 MG SL tablet Place 1 tablet (0.4 mg total) under the tongue every 5 (five) minutes x 3 doses as needed for chest pain (if no relief after 3rd dose, proceed to ED or call 911). 25 tablet 3  pantoprazole (PROTONIX) 40 MG tablet Take 40 mg by mouth daily.     rosuvastatin (CRESTOR) 20 MG tablet TAKE 1 TABLET BY MOUTH AT BEDTIME 90 tablet 1   No current facility-administered medications for this visit.    Allergies as of 12/20/2023 - Review Complete 11/08/2023  Allergen Reaction Noted   Lyrica [pregabalin] Shortness Of Breath 09/17/2012   Celecoxib Other (See Comments)    Codeine Itching 11/09/2014    Family History  Problem Relation Age of Onset   COPD Mother    Mental illness Mother    Hypertension Mother    Varicose Veins Mother    Heart attack Mother 62   Hypertension Father    Alcohol abuse Father    Colon cancer Neg Hx     Social History   Socioeconomic History   Marital status: Married    Spouse name: Margie   Number of children: 0   Years of education: Ged   Highest education level: Not on file  Occupational History    Comment: Disabled  Tobacco Use   Smoking status: Former    Current packs/day: 0.00     Average packs/day: 0.2 packs/day for 45.0 years (9.0 ttl pk-yrs)    Types: Cigarettes    Start date: 12/21/1972    Quit date: 12/21/2017    Years since quitting: 5.9   Smokeless tobacco: Never  Vaping Use   Vaping status: Former  Substance and Sexual Activity   Alcohol use: Yes    Alcohol/week: 0.0 standard drinks of alcohol    Comment: 1-2 beers in 6 months.    Drug use: No   Sexual activity: Not Currently  Other Topics Concern   Not on file  Social History Narrative   Patient lives at home with his wife. [facility]).    Disabled.   Right handed.   Caffeine- two cups daily.   Three step children.   Social Drivers of Corporate investment banker Strain: Not on file  Food Insecurity: Low Risk  (06/15/2023)   Received from [facility]   Hunger Vital Sign    Worried About Running Out of Food in the Last Year: Never true    Ran Out of Food in the Last Year: Never true  Transportation Needs: No Transportation Needs (06/15/2023)   Received from [facility]    In the past 12 months, has lack of reliable transportation kept you from medical appointments, meetings, work or from getting things needed for daily living? : No  Physical Activity: Not on file  Stress: Not on file  Social Connections: Not on file    Review of Systems: Gen: Denies fever, chills, anorexia. Denies fatigue, weakness, weight loss.  CV: Denies chest pain, palpitations, syncope, peripheral edema, and claudication. Resp: Denies dyspnea at rest, cough, wheezing, coughing up blood, and pleurisy. GI: Denies vomiting blood, jaundice, and fecal incontinence.   Denies dysphagia or odynophagia. Derm: Denies rash, itching, dry skin Psych: Denies depression, anxiety, memory loss, confusion. No homicidal or suicidal ideation.  Heme: Denies bruising, bleeding, and enlarged lymph nodes.  Physical Exam: There were no vitals taken for this visit. General:   Alert and oriented. No distress noted. Pleasant and  cooperative.  Head:  Normocephalic and atraumatic. Eyes:  Conjuctiva clear without scleral icterus. Heart:  S1, S2 present without murmurs appreciated. Lungs:  Clear to auscultation bilaterally. No wheezes, rales, or rhonchi. No distress.  Abdomen:  +BS, soft, non-tender and non-distended. No rebound or guarding. No  HSM or masses noted. Msk:  Symmetrical without gross deformities. Normal posture. Extremities:  Without edema. Neurologic:  Alert and  oriented x4 Psych:  Normal mood and affect.    Assessment:     Plan:  ***   Ermalinda Memos, PA-C [facility] 12/20/2023

## 2023-12-20 ENCOUNTER — Encounter: Payer: Self-pay | Admitting: Gastroenterology

## 2023-12-20 ENCOUNTER — Ambulatory Visit (INDEPENDENT_AMBULATORY_CARE_PROVIDER_SITE_OTHER): Payer: No Typology Code available for payment source | Admitting: Gastroenterology

## 2023-12-20 VITALS — BP 120/69 | HR 63 | Temp 98.2°F | Ht 72.0 in | Wt 221.8 lb

## 2023-12-20 DIAGNOSIS — Z8719 Personal history of other diseases of the digestive system: Secondary | ICD-10-CM

## 2023-12-20 DIAGNOSIS — R1032 Left lower quadrant pain: Secondary | ICD-10-CM

## 2023-12-20 DIAGNOSIS — K760 Fatty (change of) liver, not elsewhere classified: Secondary | ICD-10-CM | POA: Diagnosis not present

## 2023-12-20 DIAGNOSIS — R197 Diarrhea, unspecified: Secondary | ICD-10-CM | POA: Diagnosis not present

## 2023-12-20 DIAGNOSIS — K219 Gastro-esophageal reflux disease without esophagitis: Secondary | ICD-10-CM

## 2023-12-20 NOTE — Patient Instructions (Signed)
Complete your prescription of Lialda as prescribed by Dr. Jena Gauss.  I will touch base with him regarding this medication to see if we need to continue this or monitor after your current prescription is completed.  Continue pantoprazole 40 mg daily.  Will be scheduled for an ultrasound with elastography to take another look at your liver due to history of fatty liver disease.  Instructions for fatty liver: Recommend 1-2# weight loss per week until ideal body weight through exercise & diet. Low fat/cholesterol diet.   Avoid sweets, sodas, fruit juices, sweetened beverages like tea, etc. Gradually increase exercise from 15 min daily up to 1 hr per day 5 days/week. Continue to avoid alcohol use.  We will plan to follow-up in 3 months or sooner if needed.   Ermalinda Memos, PA-C [facility]

## 2023-12-21 ENCOUNTER — Other Ambulatory Visit: Payer: Self-pay | Admitting: Gastroenterology

## 2023-12-21 ENCOUNTER — Telehealth: Payer: Self-pay | Admitting: Gastroenterology

## 2023-12-21 DIAGNOSIS — K5730 Diverticulosis of large intestine without perforation or abscess without bleeding: Secondary | ICD-10-CM

## 2023-12-21 DIAGNOSIS — R197 Diarrhea, unspecified: Secondary | ICD-10-CM

## 2023-12-21 MED ORDER — MESALAMINE 1.2 G PO TBEC: 4.8000 g | DELAYED_RELEASE_TABLET | Freq: Every day | ORAL | 2 refills | Status: DC

## 2023-12-21 NOTE — Telephone Encounter (Signed)
Discussed patient's case with Dr. Jena Gauss who recommended continuing Lialda 4.8 g daily for the next 3 months and reevaluating when I see him back in the office.  I have sent an updated prescription to his pharmacy.  I have sent patient a MyChart message regarding these recommendations, and I have also relayed the recommendations to his wife.

## 2023-12-25 ENCOUNTER — Ambulatory Visit (HOSPITAL_COMMUNITY)
Admission: RE | Admit: 2023-12-25 | Discharge: 2023-12-25 | Disposition: A | Discharge status: Home / Self Care | Payer: No Typology Code available for payment source | Source: Ambulatory Visit | Attending: Gastroenterology | Admitting: Gastroenterology

## 2023-12-25 DIAGNOSIS — K760 Fatty (change of) liver, not elsewhere classified: Secondary | ICD-10-CM | POA: Diagnosis not present

## 2023-12-25 DIAGNOSIS — R932 Abnormal findings on diagnostic imaging of liver and biliary tract: Secondary | ICD-10-CM | POA: Diagnosis not present

## 2023-12-25 DIAGNOSIS — Z9049 Acquired absence of other specified parts of digestive tract: Secondary | ICD-10-CM | POA: Diagnosis not present

## 2024-01-25 ENCOUNTER — Other Ambulatory Visit: Payer: Self-pay | Admitting: Cardiology

## 2024-01-29 DIAGNOSIS — R21 Rash and other nonspecific skin eruption: Secondary | ICD-10-CM | POA: Diagnosis not present

## 2024-01-29 DIAGNOSIS — Z299 Encounter for prophylactic measures, unspecified: Secondary | ICD-10-CM | POA: Diagnosis not present

## 2024-01-29 DIAGNOSIS — D692 Other nonthrombocytopenic purpura: Secondary | ICD-10-CM | POA: Diagnosis not present

## 2024-01-29 DIAGNOSIS — I25119 Atherosclerotic heart disease of native coronary artery with unspecified angina pectoris: Secondary | ICD-10-CM | POA: Diagnosis not present

## 2024-01-29 DIAGNOSIS — I700 Atherosclerosis of aorta: Secondary | ICD-10-CM | POA: Diagnosis not present

## 2024-01-29 DIAGNOSIS — I10 Essential (primary) hypertension: Secondary | ICD-10-CM | POA: Diagnosis not present

## 2024-02-12 DIAGNOSIS — I700 Atherosclerosis of aorta: Secondary | ICD-10-CM | POA: Diagnosis not present

## 2024-02-12 DIAGNOSIS — Z1339 Encounter for screening examination for other mental health and behavioral disorders: Secondary | ICD-10-CM | POA: Diagnosis not present

## 2024-02-12 DIAGNOSIS — Z7189 Other specified counseling: Secondary | ICD-10-CM | POA: Diagnosis not present

## 2024-02-12 DIAGNOSIS — I779 Disorder of arteries and arterioles, unspecified: Secondary | ICD-10-CM | POA: Diagnosis not present

## 2024-02-12 DIAGNOSIS — Z0000 Encounter for general adult medical examination without abnormal findings: Secondary | ICD-10-CM | POA: Diagnosis not present

## 2024-02-12 DIAGNOSIS — I10 Essential (primary) hypertension: Secondary | ICD-10-CM | POA: Diagnosis not present

## 2024-02-12 DIAGNOSIS — K5010 Crohn's disease of large intestine without complications: Secondary | ICD-10-CM | POA: Diagnosis not present

## 2024-02-12 DIAGNOSIS — Z1331 Encounter for screening for depression: Secondary | ICD-10-CM | POA: Diagnosis not present

## 2024-02-12 DIAGNOSIS — Z299 Encounter for prophylactic measures, unspecified: Secondary | ICD-10-CM | POA: Diagnosis not present

## 2024-02-25 NOTE — Progress Notes (Unsigned)
 Referring Provider: Ignatius Specking, MD Primary Care Physician:  Ignatius Specking, MD Primary GI Physician: Dr. Jena Gauss  No chief complaint on file.   HPI:   Seth Savage is a 73 y.o. male with history of fatty liver, GERD, PUD secondary to H. pylori s/p documented eradication by biopsies in 2018, chronic RUQ abdominal pain predating cholecystectomy in June 2019 s/p CT A/P with contrast July 2019 with small post op fluid collection, no other significant findings, EGD in September 2019 with subtle abnormalities of stomach and duodenum of doubtful clinical significance (pathology benign), and RUQ [place] with no significant findings. Celiac screen negative in 2022.    Also with partial colectomy for intussusception in 2000, colon polyps, rectal bleeding in the setting of hemorrhoids, and chronic loose stool following cholecystectomy.       Patient is presenting today for follow-up of diarrhea/SCAD.   C. difficile and GI pathogen panel negative last year.  Colonoscopy 11/08/2023 with sigmoid and descending colon diverticulosis, 10 cm band of submucosal petechiae involving the distal sigmoid segment. Ascending colon, descending colon biopsies unremarkable. Sigmoid colon biopsy showed colonic mucosa with mild, focal hyperplastic changes. No inflammation. Dr. Jena Gauss queried element of segmental colitis associated with diverticulosis. Recommended starting Lialda 4.8 g daily x 30 days and follow-up in the office in 4-6 weeks.    Last seen in the office 12/20/2023.  He was doing well on Lialda, having 1 bowel movement each morning.  Stool consistency improved loose/mushy which was close to his baseline.  Minimal LLQ discomfort.  GERD well-controlled.  No symptoms of decompensated liver disease.  Recommended completing course of Liotta, would discuss further with Dr. Jena Gauss.  Also recommended abdominal ultrasound liver elastography, continue pantoprazole, and follow-up in 3 months.  Abdominal ultrasound with  elastography 12/25/2023 with hepatic steatosis, K PA 5.3.  Fib 4 low at 1.13.   Case discussed with Dr. Jena Gauss who recommended continuing Lialda 4.8 g daily x 3 months, then reevaluate.  Today:       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   Carotid artery disease (HCC)    Left CEA 2001 and right CEA 2004   Childhood asthma    COPD (chronic obstructive pulmonary disease) (HCC)    Essential hypertension    Fatty liver    GERD (gastroesophageal reflux disease)    Goiter    Radioactive iodine treatment   H pylori ulcer 11/2014   Treated with Prevpac; documented eradication by biopsy in 2018   History of bronchitis    History of kidney stones    History of pneumonia as a child    Hypothyroidism    Migraine    Mixed hyperlipidemia    Neuropathy     Past Surgical History:  Procedure Laterality Date   BIOPSY  05/01/2017   Procedure: BIOPSY;  Surgeon: Corbin Ade, MD;  Location: [facility];  Service: Endoscopy;;  gastric   BIOPSY  08/19/2018   Procedure: BIOPSY;  Surgeon: Corbin Ade, MD;  Location: [facility];  Service: Endoscopy;;  duodenal and gastric   BIOPSY  11/08/2023   Procedure: BIOPSY;  Surgeon: Corbin Ade, MD;  Location: [facility];  Service: Endoscopy;;   CARDIAC CATHETERIZATION N/A 07/05/2016   Procedure: Left Heart Cath and Coronary Angiography;  Surgeon: Kathleene Hazel, MD;  Location: [facility];  Service: Cardiovascular;  Laterality: N/A;   CARDIAC CATHETERIZATION  03/12/2018   CAROTID ENDARTERECTOMY Left ?2009   CARPAL TUNNEL RELEASE Right    CHOLECYSTECTOMY N/A 04/24/2018   Procedure: LAPAROSCOPIC CHOLECYSTECTOMY;  Surgeon: Franky Macho, MD;  Location: [facility];  Service: General;  Laterality: N/A;   COLECTOMY  2000   Secondary to intussuception   COLONOSCOPY  08/24/2004   Normal rectum,Small polyp  at 25 cm, cold snared/ The remainder of the colonic mucosa appeared normal   COLONOSCOPY  09/24/2012   Dr. Elly Modena hemorrhoids-likely source of hematochezia.Colonic diverticulosis   COLONOSCOPY N/A 05/01/2017   Dr. Jena Gauss: Diverticulosis, next colonoscopy in 5 years.   COLONOSCOPY WITH PROPOFOL N/A 04/28/2019   Propofol; Dr. Jena Gauss; moderate, medium sized grade 1 internal hemorrhoids, diverticulosis in sigmoid and descending colon, otherwise normal.  Repeat in 5 years.   COLONOSCOPY WITH PROPOFOL N/A 11/08/2023   Procedure: COLONOSCOPY WITH PROPOFOL;  Surgeon: Corbin Ade, MD;  Location: [facility];  Service: Endoscopy;  Laterality: N/A;  12:45 pm .asa 2   ENDARTERECTOMY Right 07/07/2016   Procedure: ENDARTERECTOMY CAROTID;  Surgeon: Larina Earthly, MD;  Location: [facility] OR;  Service: Vascular;  Laterality: Right;   ESOPHAGOGASTRODUODENOSCOPY N/A 12/09/2014   Dr. Jena Gauss: Erosive reflux esophagitis. Peptic Ulcer disease secondary to H.pylori. small hiatal hernia. Nonbleeding duodenal AVM. Duodenal erosions. TREATED WITH PREVPAC   ESOPHAGOGASTRODUODENOSCOPY N/A 03/15/2015   Dr. Jena Gauss: small hiatal hernia, healed PUD, duodenal AVM.    ESOPHAGOGASTRODUODENOSCOPY N/A 05/01/2017   Dr. Jena Gauss: LA grade a esophagitis, gastritis, no H pylori   ESOPHAGOGASTRODUODENOSCOPY (EGD) WITH PROPOFOL N/A 08/19/2018   PROPOFOL;  Surgeon: Corbin Ade, MD; normal esophagus, mild erythema and erosions in the entire stomach, small hiatal hernia, subtle white papular areas and duodenal bulb second and third portions of the duodenum.   FRACTURE SURGERY     LEFT HEART CATH AND CORONARY ANGIOGRAPHY N/A 03/12/2018   Procedure: LEFT HEART CATH AND CORONARY ANGIOGRAPHY;  Surgeon: Kathleene Hazel, MD;  Location: [facility];  Service: Cardiovascular;  Laterality: N/A;   Multiple bilateral arm/wrist surgeries after falls     ORIF SHOULDER FRACTURE Left 06/2002   "shattered; put a bunch of metal plates in it"    PATCH ANGIOPLASTY Right 07/07/2016   Procedure: PATCH ANGIOPLASTY USING 0.8CM X 7.6CM HEMASHIELD PATCH;  Surgeon: Larina Earthly, MD;  Location: [facility] OR;  Service: Vascular;  Laterality: Right;   SHOULDER ARTHROSCOPY W/ ROTATOR CUFF REPAIR Right 1999   SHOULDER OPEN ROTATOR CUFF REPAIR Left 1992   Metal implant     Current Outpatient Medications  Medication Sig Dispense Refill   amLODipine (NORVASC) 10 MG tablet TAKE 1 TABLET BY MOUTH EVERY DAY 90 tablet 3   aspirin EC 81 MG tablet Take 81 mg by mouth at bedtime.      dicyclomine (BENTYL) 10 MG capsule Take 1 capsule (10 mg total) by mouth 4 (four) times daily as needed for spasms. (Patient not taking: Reported on 12/20/2023) 120 capsule 1   isosorbide mononitrate (IMDUR) 60 MG 24 hr tablet TAKE 1 TABLET BY MOUTH DAILY 90 tablet 3   levothyroxine (SYNTHROID) 175 MCG tablet Take 175 mcg by mouth daily.     losartan (COZAAR) 50 MG tablet TAKE 1 TABLET BY MOUTH EVERY DAY 90 tablet 1   mesalamine (LIALDA) 1.2 g EC tablet Take 4 tablets (4.8 g total) by mouth daily with breakfast. 120 tablet 2   metoprolol  succinate (TOPROL-XL) 25 MG 24 hr tablet TAKE 1 TABLET BY MOUTH EVERY DAY 90 tablet 1   nitroGLYCERIN (NITROSTAT) 0.4 MG SL tablet Place 1 tablet (0.4 mg total) under the tongue every 5 (five) minutes x 3 doses as needed for chest pain (if no relief after 3rd dose, proceed to ED or call 911). 25 tablet 3   pantoprazole (PROTONIX) 40 MG tablet Take 40 mg by mouth daily.     rosuvastatin (CRESTOR) 20 MG tablet TAKE 1 TABLET BY MOUTH AT BEDTIME 90 tablet 1   No current facility-administered medications for this visit.    Allergies as of 02/27/2024 - Review Complete 12/20/2023  Allergen Reaction Noted   Lyrica [pregabalin] Shortness Of Breath 09/17/2012   Celecoxib Other (See Comments)    Codeine Itching 11/09/2014    Family History  Problem Relation Age of Onset   COPD Mother    Mental illness Mother    Hypertension Mother    Varicose Veins  Mother    Heart attack Mother 19   Hypertension Father    Alcohol abuse Father    Colon cancer Neg Hx     Social History   Socioeconomic History   Marital status: Married    Spouse name: Margie   Number of children: 0   Years of education: Ged   Highest education level: Not on file  Occupational History    Comment: Disabled  Tobacco Use   Smoking status: Former    Current packs/day: 0.00    Average packs/day: 0.2 packs/day for 45.0 years (9.0 ttl pk-yrs)    Types: Cigarettes    Start date: 12/21/1972    Quit date: 12/21/2017    Years since quitting: 6.1   Smokeless tobacco: Never  Vaping Use   Vaping status: Former  Substance and Sexual Activity   Alcohol use: Yes    Alcohol/week: 0.0 standard drinks of alcohol    Comment: 1-2 beers in 6 months.    Drug use: No   Sexual activity: Not Currently  Other Topics Concern   Not on file  Social History Narrative   Patient lives at home with his wife. [facility]).    Disabled.   Right handed.   Caffeine- two cups daily.   Three step children.   Social Drivers of Corporate investment banker Strain: Not on file  Food Insecurity: Low Risk  (06/15/2023)   Received from [facility]   Hunger Vital Sign    Worried About Running Out of Food in the Last Year: Never true    Ran Out of Food in the Last Year: Never true  Transportation Needs: No Transportation Needs (06/15/2023)   Received from [facility]    In the past 12 months, has lack of reliable transportation kept you from medical appointments, meetings, work or from getting things needed for daily living? : No  Physical Activity: Not on file  Stress: Not on file  Social Connections: Not on file    Review of Systems: Gen: Denies fever, chills, anorexia. Denies fatigue, weakness, weight loss.  CV: Denies chest pain, palpitations, syncope, peripheral edema, and claudication. Resp: Denies dyspnea at rest, cough, wheezing, coughing up blood, and  pleurisy. GI: Denies vomiting blood, jaundice, and fecal incontinence.   Denies dysphagia or odynophagia. Derm: Denies rash, itching, dry skin Psych: Denies depression, anxiety, memory loss, confusion. No homicidal or suicidal ideation.  Heme: Denies bruising, bleeding, and enlarged lymph nodes.  Physical Exam: There were no vitals  taken for this visit. General:   Alert and oriented. No distress noted. Pleasant and cooperative.  Head:  Normocephalic and atraumatic. Eyes:  Conjuctiva clear without scleral icterus. Heart:  S1, S2 present without murmurs appreciated. Lungs:  Clear to auscultation bilaterally. No wheezes, rales, or rhonchi. No distress.  Abdomen:  +BS, soft, non-tender and non-distended. No rebound or guarding. No HSM or masses noted. Msk:  Symmetrical without gross deformities. Normal posture. Extremities:  Without edema. Neurologic:  Alert and  oriented x4 Psych:  Normal mood and affect.    Assessment:     Plan:  ***   Ermalinda Memos, PA-C [facility] 02/27/2024

## 2024-02-27 ENCOUNTER — Encounter: Payer: Self-pay | Admitting: Gastroenterology

## 2024-02-27 ENCOUNTER — Ambulatory Visit: Admitting: Gastroenterology

## 2024-02-27 VITALS — BP 129/70 | HR 72 | Temp 97.8°F | Ht 72.0 in | Wt 225.4 lb

## 2024-02-27 DIAGNOSIS — K760 Fatty (change of) liver, not elsewhere classified: Secondary | ICD-10-CM

## 2024-02-27 DIAGNOSIS — K5010 Crohn's disease of large intestine without complications: Secondary | ICD-10-CM | POA: Diagnosis not present

## 2024-02-27 DIAGNOSIS — K5730 Diverticulosis of large intestine without perforation or abscess without bleeding: Secondary | ICD-10-CM | POA: Diagnosis not present

## 2024-02-27 DIAGNOSIS — K529 Noninfective gastroenteritis and colitis, unspecified: Secondary | ICD-10-CM

## 2024-02-27 NOTE — Patient Instructions (Signed)
 You can try decreasing Lialda to 2.4 mg daily monitor for any recurrent left lower quadrant abdominal pain or increased diarrhea.  As we discussed, I suspect that your chronic loose stools are related to not having a gallbladder.  This is called bile salt diarrhea.  As requested hold off on any sort of additional medication management, I recommend following a strict low-fat diet.  Low-fat diet recommendations: Avoid red meats and pork. Recommend poultry or fish. Meats should be baked, boiled, broiled.  Not fried. Limit sweets, snack type foods. Low-fat dairy products.  We will plan to get you back to see Dr. Jena Gauss in about 8 weeks to discus possibly coming off of Lialda vs continuing long term.   Ermalinda Memos, PA-C [facility]

## 2024-03-03 DIAGNOSIS — Z299 Encounter for prophylactic measures, unspecified: Secondary | ICD-10-CM | POA: Diagnosis not present

## 2024-03-03 DIAGNOSIS — I700 Atherosclerosis of aorta: Secondary | ICD-10-CM | POA: Diagnosis not present

## 2024-03-03 DIAGNOSIS — R509 Fever, unspecified: Secondary | ICD-10-CM | POA: Diagnosis not present

## 2024-03-03 DIAGNOSIS — R300 Dysuria: Secondary | ICD-10-CM | POA: Diagnosis not present

## 2024-03-10 ENCOUNTER — Other Ambulatory Visit: Payer: Self-pay | Admitting: Gastroenterology

## 2024-03-10 DIAGNOSIS — K5010 Crohn's disease of large intestine without complications: Secondary | ICD-10-CM

## 2024-03-10 DIAGNOSIS — R197 Diarrhea, unspecified: Secondary | ICD-10-CM

## 2024-03-14 DIAGNOSIS — J069 Acute upper respiratory infection, unspecified: Secondary | ICD-10-CM | POA: Diagnosis not present

## 2024-03-14 DIAGNOSIS — R197 Diarrhea, unspecified: Secondary | ICD-10-CM | POA: Diagnosis not present

## 2024-03-14 DIAGNOSIS — Z299 Encounter for prophylactic measures, unspecified: Secondary | ICD-10-CM | POA: Diagnosis not present

## 2024-03-14 DIAGNOSIS — R0981 Nasal congestion: Secondary | ICD-10-CM | POA: Diagnosis not present

## 2024-04-01 ENCOUNTER — Ambulatory Visit
Admission: EM | Admit: 2024-04-01 | Discharge: 2024-04-01 | Disposition: A | Discharge status: Home / Self Care | Attending: Family Medicine | Admitting: Family Medicine

## 2024-04-01 ENCOUNTER — Ambulatory Visit

## 2024-04-01 DIAGNOSIS — R052 Subacute cough: Secondary | ICD-10-CM | POA: Diagnosis not present

## 2024-04-01 DIAGNOSIS — H5340 Unspecified visual field defects: Secondary | ICD-10-CM | POA: Diagnosis not present

## 2024-04-01 DIAGNOSIS — N390 Urinary tract infection, site not specified: Secondary | ICD-10-CM | POA: Diagnosis not present

## 2024-04-01 LAB — POCT URINALYSIS DIP (MANUAL ENTRY)
Glucose, UA: NEGATIVE mg/dL
Nitrite, UA: POSITIVE — AB
Protein Ur, POC: 100 mg/dL — AB
Spec Grav, UA: 1.025 (ref 1.010–1.025)
Urobilinogen, UA: 0.2 U/dL
pH, UA: 5.5 (ref 5.0–8.0)

## 2024-04-01 MED ORDER — GUAIFENESIN ER 600 MG PO TB12
600.0000 mg | ORAL_TABLET | Freq: Two times a day (BID) | ORAL | 0 refills | Status: DC | PRN
Start: 2024-04-01 — End: 2024-04-05

## 2024-04-01 MED ORDER — CEPHALEXIN 500 MG PO CAPS: 500.0000 mg | ORAL_CAPSULE | Freq: Two times a day (BID) | ORAL | 0 refills | Status: DC

## 2024-04-01 MED ORDER — AZELASTINE HCL 0.1 % NA SOLN: 1.0000 | Freq: Two times a day (BID) | NASAL | 0 refills | Status: AC

## 2024-04-01 NOTE — Discharge Instructions (Signed)
 We will let you know if anything comes back abnormal on your chest x-ray.  I have sent over Mucinex  and a nasal spray to help with your ongoing cough and you may take over-the-counter remedies such as Coricidin HBP, antihistamines as needed.  I have sent over antibiotics for a possible urinary tract infection, we will await the urine culture for further details.  As discussed, I am concerned about the vision changes that you are describing and if you are not interested in going to the emergency department for further evaluation of this, please do follow-up with ophthalmology in the next day or so for further evaluation.  I do recommend immediate evaluation of this issue.

## 2024-04-01 NOTE — ED Triage Notes (Signed)
 Pt reports he has a fever, burning with urination, dark urination and frequent urination x 3w weeks.  States he has a cough as well

## 2024-04-01 NOTE — ED Provider Notes (Signed)
 [facility]    CSN: [id number] Arrival date & time: 04/01/24  1757      History   Chief Complaint No chief complaint on file.   HPI Seth Savage is a 73 y.o. male.   Patient presenting today with 3-week history of ongoing productive cough, intermittent fevers, dysuria, dark urine, urinary frequency.  States he was on 2 rounds of antibiotics several weeks ago for his cough with only slight relief each time.  Denies chest pain, shortness of breath, wheezing, abdominal pain, vomiting, diarrhea.  History of COPD and childhood asthma not currently on any inhaler regimen.  He also notes in passing that about a week ago he lost the left lateral visual field while working.  The vision in this area has been slightly restored but still looks "smoky" to him.  Wife also notes that his eye looks like it is bulging for the past few days.  Denies redness, eye pain, severe headache, nausea, vomiting.  Not try anything for the symptoms.  Has not been evaluated for the symptoms.    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   Carotid artery disease (HCC)    Left CEA 2001 and right CEA 2004   Childhood asthma    COPD (chronic obstructive pulmonary disease) (HCC)    Essential hypertension    Fatty liver    GERD (gastroesophageal reflux disease)    Goiter    Radioactive iodine  treatment   H pylori ulcer 11/2014   Treated with Prevpac; documented eradication by biopsy in 2018   History of bronchitis    History of kidney stones    History of pneumonia as a child    Hypothyroidism    Migraine    Mixed hyperlipidemia    Neuropathy     Patient Active Problem List   Diagnosis Date Noted   Dysphagia 03/07/2021   Fatty liver 09/03/2020   Loose stools 09/03/2020   Heme positive stool 05/13/2018   Abdominal pain, epigastric 05/13/2018   Acute URI 05/02/2018    Adult body mass index 28.0-28.9 05/02/2018   Allergic rhinitis 05/02/2018   Elevated PSA 05/02/2018   Screening for prostate cancer 05/02/2018   Fatigue 05/02/2018   Former smoker 05/02/2018   Gastroesophageal reflux disease 05/02/2018   History of hay fever 05/02/2018   History of migraine headaches 05/02/2018   History of hypothyroidism 05/02/2018   History of renal calculi 05/02/2018   Hypercholesterolemia 05/02/2018   Hypothyroid 05/02/2018   Kidney stone 05/02/2018   Migraine 05/02/2018   Screening for depression 05/02/2018   Biliary colic 04/25/2018   Ventricular tachyarrhythmia (HCC) 04/24/2018   Gallbladder sludge    VT (ventricular tachycardia) (HCC)    Coronary artery disease due to lipid rich plaque    S/P laparoscopic cholecystectomy    Unstable angina (HCC) 03/12/2018   History of colonic polyps 03/23/2017   RUQ pain 02/07/2017   Asymptomatic carotid artery stenosis 07/07/2016   Abnormal stress test    Coronary artery disease involving native coronary artery of native heart without angina pectoris    Tobacco use disorder 03/23/2015   Essential hypertension 03/23/2015   Chest pain 03/22/2015   History of peptic ulcer disease    PUD (peptic ulcer disease) 02/25/2015   Acute gastric ulcer    Reflux esophagitis    Headache 09/03/2013   Occlusion  and stenosis of carotid artery without mention of cerebral infarction 12/12/2012   LUQ pain 09/23/2012   Hematochezia 09/23/2012    Past Surgical History:  Procedure Laterality Date   BIOPSY  05/01/2017   Procedure: BIOPSY;  Surgeon: Suzette Espy, MD;  [facility]: [facility];  Service: Endoscopy;;  gastric   BIOPSY  08/19/2018   Procedure: BIOPSY;  Surgeon: Suzette Espy, MD;  Location: [facility];  Service: Endoscopy;;  duodenal and gastric   BIOPSY  11/08/2023   Procedure: BIOPSY;  Surgeon: Suzette Espy, MD;  Location: [facility];  Service: Endoscopy;;   CARDIAC CATHETERIZATION N/A 07/05/2016    Procedure: Left Heart Cath and Coronary Angiography;  Surgeon: Odie Benne, MD;  Location: [facility]VAS[facility] CV LAB;  Service: Cardiovascular;  Laterality: N/A;   CARDIAC CATHETERIZATION  03/12/2018   CAROTID ENDARTERECTOMY Left ?2009   CARPAL TUNNEL RELEASE Right    CHOLECYSTECTOMY N/A 04/24/2018   Procedure: LAPAROSCOPIC CHOLECYSTECTOMY;  Surgeon: Alanda Allegra, MD;  Location: [facility];  Service: General;  Laterality: N/A;   COLECTOMY  2000   Secondary to intussuception   COLONOSCOPY  08/24/2004   Normal rectum,Small polyp at 25 cm, cold snared/ The remainder of the colonic mucosa appeared normal   COLONOSCOPY  09/24/2012   Dr. Burrell Casa hemorrhoids-likely source of hematochezia.Colonic diverticulosis   COLONOSCOPY N/A 05/01/2017   Dr. Riley Cheadle: Diverticulosis, next colonoscopy in 5 years.   COLONOSCOPY WITH PROPOFOL  N/A 04/28/2019   Propofol ; Dr. Riley Cheadle; moderate, medium sized grade 1 internal hemorrhoids, diverticulosis in sigmoid and descending colon, otherwise normal.  Repeat in 5 years.   COLONOSCOPY WITH PROPOFOL  N/A 11/08/2023   Procedure: COLONOSCOPY WITH PROPOFOL ;  Surgeon: Suzette Espy, MD;  Location: [facility];  Service: Endoscopy;  Laterality: N/A;  12:45 pm .asa 2   ENDARTERECTOMY Right 07/07/2016   Procedure: ENDARTERECTOMY CAROTID;  Surgeon: Mayo Speck, MD;  Location: [facility] OR;  Service: Vascular;  Laterality: Right;   ESOPHAGOGASTRODUODENOSCOPY N/A 12/09/2014   Dr. Riley Cheadle: Erosive reflux esophagitis. Peptic Ulcer disease secondary to H.pylori. small hiatal hernia. Nonbleeding duodenal AVM. Duodenal erosions. TREATED WITH PREVPAC   ESOPHAGOGASTRODUODENOSCOPY N/A 03/15/2015   Dr. Riley Cheadle: small hiatal hernia, healed PUD, duodenal AVM.    ESOPHAGOGASTRODUODENOSCOPY N/A 05/01/2017   Dr. Riley Cheadle: LA grade a esophagitis, gastritis, no H pylori   ESOPHAGOGASTRODUODENOSCOPY (EGD) WITH PROPOFOL  N/A 08/19/2018   PROPOFOL ;  Surgeon: Suzette Espy, MD; normal esophagus, mild erythema and  erosions in the entire stomach, small hiatal hernia, subtle white papular areas and duodenal bulb second and third portions of the duodenum.   FRACTURE SURGERY     LEFT HEART CATH AND CORONARY ANGIOGRAPHY N/A 03/12/2018   Procedure: LEFT HEART CATH AND CORONARY ANGIOGRAPHY;  Surgeon: Odie Benne, MD;  Location: [facility];  Service: Cardiovascular;  Laterality: N/A;   Multiple bilateral arm/wrist surgeries after falls     ORIF SHOULDER FRACTURE Left 06/2002   "shattered; put a bunch of metal plates in it"   PATCH ANGIOPLASTY Right 07/07/2016   Procedure: PATCH ANGIOPLASTY USING 0.8CM X 7.6CM HEMASHIELD PATCH;  Surgeon: Mayo Speck, MD;  Location: [facility] OR;  Service: Vascular;  Laterality: Right;   SHOULDER ARTHROSCOPY W/ ROTATOR CUFF REPAIR Right 1999   SHOULDER OPEN ROTATOR CUFF REPAIR Left 1992   Metal implant        Home Medications    Prior to Admission medications   Medication Sig Start Date End Date Taking? Authorizing Provider  azelastine (ASTELIN) 0.1 % nasal spray Place 1 spray into both nostrils 2 (two) times daily. Use in each nostril as directed 04/01/24  Yes Corbin Dess, PA-C  cephALEXin (KEFLEX) 500 MG capsule Take 1 capsule (500 mg total) by mouth 2 (two) times daily. 04/01/24  Yes Corbin Dess, PA-C  guaiFENesin  (MUCINEX ) 600 MG 12 hr tablet Take 1 tablet (600 mg total) by mouth 2 (two) times daily as needed. 04/01/24  Yes Corbin Dess, PA-C  amLODipine  (NORVASC ) 10 MG tablet TAKE 1 TABLET BY MOUTH EVERY DAY 05/10/23   Gerard Knight, MD  aspirin  EC 81 MG tablet Take 81 mg by mouth at bedtime.     [provider]  dicyclomine  (BENTYL ) 10 MG capsule Take 1 capsule (10 mg total) by mouth 4 (four) times daily as needed for spasms. Patient not taking: Reported on 02/27/2024 10/16/23   Suzette Espy, MD  isosorbide  mononitrate (IMDUR ) 60 MG 24 hr tablet TAKE 1 TABLET BY MOUTH DAILY 01/25/24   Gerard Knight, MD   levothyroxine  (SYNTHROID ) 175 MCG tablet Take 175 mcg by mouth daily. 01/14/21   [provider]  losartan  (COZAAR ) 50 MG tablet TAKE 1 TABLET BY MOUTH EVERY DAY 11/05/23   Gerard Knight, MD  mesalamine  (LIALDA ) 1.2 g EC tablet Take 2 tablets (2.4 g total) by mouth daily with breakfast. 03/11/24   Evander Hills, PA-C  metoprolol  succinate (TOPROL -XL) 25 MG 24 hr tablet TAKE 1 TABLET BY MOUTH EVERY DAY 11/05/23   Gerard Knight, MD  nitroGLYCERIN  (NITROSTAT ) 0.4 MG SL tablet Place 1 tablet (0.4 mg total) under the tongue every 5 (five) minutes x 3 doses as needed for chest pain (if no relief after 3rd dose, proceed to ED or call 911). 03/21/23   Gerard Knight, MD  pantoprazole  (PROTONIX ) 40 MG tablet Take 40 mg by mouth daily.    [provider]  rosuvastatin  (CRESTOR ) 20 MG tablet TAKE 1 TABLET BY MOUTH AT BEDTIME 11/05/23   Gerard Knight, MD    Family History Family History  Problem Relation Age of Onset   COPD Mother    Mental illness Mother    Hypertension Mother    Varicose Veins Mother    Heart attack Mother 67   Hypertension Father    Alcohol  abuse Father    Colon cancer Neg Hx     Social History Social History   Tobacco Use   Smoking status: Former    Current packs/day: 0.00    Average packs/day: 0.2 packs/day for 45.0 years (9.0 ttl pk-yrs)    Types: Cigarettes    Start date: 12/21/1972    Quit date: 12/21/2017    Years since quitting: 6.2   Smokeless tobacco: Never  Vaping Use   Vaping status: Former  Substance Use Topics   Alcohol  use: Yes    Alcohol /week: 0.0 standard drinks of alcohol     Comment: 1-2 beers in 6 months.    Drug use: No     Allergies   Lyrica [pregabalin], Celecoxib, and Codeine   Review of Systems Review of Systems Per HPI  Physical Exam Triage Vital Signs ED Triage Vitals [04/01/24 1803]  Encounter Vitals Group     BP 116/67     Systolic BP Percentile      Diastolic BP Percentile      Pulse Rate  76     Resp 20     Temp 98.4 F (36.9 C)  Temp Source Oral     SpO2 94 %     Weight      Height      Head Circumference      Peak Flow      Pain Score      Pain Loc      Pain Education      Exclude from Growth Chart    No data found.  Updated Vital Signs BP 116/67 (BP Location: Right Arm)   Pulse 76   Temp 98.4 F (36.9 C) (Oral)   Resp 20   SpO2 94%   Visual Acuity Right Eye Distance: 20/25 Left Eye Distance: 20/20 Bilateral Distance: 20/20  Right Eye Near:   Left Eye Near:    Bilateral Near:     Physical Exam Vitals and nursing note reviewed.  Constitutional:      Appearance: He is well-developed.  HENT:     Head: Atraumatic.     Right Ear: External ear normal.     Left Ear: External ear normal.     Nose: Nose normal.     Mouth/Throat:     Pharynx: No oropharyngeal exudate or posterior oropharyngeal erythema.  Eyes:     Extraocular Movements: Extraocular movements intact.     Conjunctiva/sclera: Conjunctivae normal.     Pupils: Pupils are equal, round, and reactive to light.     Comments: Left eye does appear to be bulging compared to right but no obvious conjunctival discoloration, injection.  Cardiovascular:     Rate and Rhythm: Normal rate and regular rhythm.  Pulmonary:     Effort: Pulmonary effort is normal. No respiratory distress.     Breath sounds: Stridor present. No wheezing or rales.  Abdominal:     General: Bowel sounds are normal. There is no distension.     Palpations: Abdomen is soft.     Tenderness: There is no abdominal tenderness. There is no right CVA tenderness, left CVA tenderness or guarding.  Musculoskeletal:        General: Normal range of motion.     Cervical back: Normal range of motion and neck supple.  Lymphadenopathy:     Cervical: No cervical adenopathy.  Skin:    General: Skin is warm and dry.  Neurological:     Mental Status: He is alert and oriented to person, place, and time.  Psychiatric:        Behavior:  Behavior normal.      UC Treatments / Results  Labs (all labs ordered are listed, but only abnormal results are displayed) Labs Reviewed  POCT URINALYSIS DIP (MANUAL ENTRY) - Abnormal; Notable for the following components:      Result Value   Color, UA straw (*)    Bilirubin, UA small (*)    Ketones, POC UA trace (5) (*)    Blood, UA small (*)    Protein Ur, POC =100 (*)    Nitrite, UA Positive (*)    Leukocytes, UA Small (1+) (*)    All other components within normal limits  URINE CULTURE    EKG   Radiology No results found.  Procedures Procedures (including critical care time)  Medications Ordered in UC Medications - No data to display  Initial Impression / Assessment and Plan / UC Course  I have reviewed the triage vital signs and the nursing notes.  Pertinent labs & imaging results that were available during my care of the patient were reviewed by me and considered in my medical decision  making (see chart for details).     Chest x-ray pending, in the meantime we will treat with Mucinex , Astelin nasal spray, supportive over-the-counter medications and home care.  Will start Keflex for suspected urinary tract infection while awaiting urine culture for further evaluation.  Regarding the new visual field defect and bulging of the left eye recommended emergency department for immediate evaluation but if not this then immediate ophthalmology evaluation in the next day or so.  He opts for outpatient evaluation and will call them in the morning.  No other obvious deficits today on exam.  Visual acuity reassuring.  Final Clinical Impressions(s) / UC Diagnoses   Final diagnoses:  Acute lower UTI  Subacute cough  Visual field defect of left eye     Discharge Instructions      We will let you know if anything comes back abnormal on your chest x-ray.  I have sent over Mucinex  and a nasal spray to help with your ongoing cough and you may take over-the-counter remedies  such as Coricidin HBP, antihistamines as needed.  I have sent over antibiotics for a possible urinary tract infection, we will await the urine culture for further details.  As discussed, I am concerned about the vision changes that you are describing and if you are not interested in going to the emergency department for further evaluation of this, please do follow-up with ophthalmology in the next day or so for further evaluation.  I do recommend immediate evaluation of this issue.  ED Prescriptions     Medication Sig Dispense Auth. Provider   cephALEXin (KEFLEX) 500 MG capsule Take 1 capsule (500 mg total) by mouth 2 (two) times daily. 14 capsule Corbin Dess, PA-C   guaiFENesin  (MUCINEX ) 600 MG 12 hr tablet Take 1 tablet (600 mg total) by mouth 2 (two) times daily as needed. 20 tablet Corbin Dess, PA-C   azelastine (ASTELIN) 0.1 % nasal spray Place 1 spray into both nostrils 2 (two) times daily. Use in each nostril as directed 30 mL Corbin Dess, PA-C      PDMP not reviewed this encounter.   Corbin Dess, [place] 04/01/24 1958

## 2024-04-02 ENCOUNTER — Encounter: Payer: Self-pay | Admitting: Cardiology

## 2024-04-02 ENCOUNTER — Ambulatory Visit: Attending: Cardiology | Admitting: Cardiology

## 2024-04-02 VITALS — BP 124/72 | HR 63 | Ht 72.0 in | Wt 216.4 lb

## 2024-04-02 DIAGNOSIS — I25119 Atherosclerotic heart disease of native coronary artery with unspecified angina pectoris: Secondary | ICD-10-CM | POA: Diagnosis not present

## 2024-04-02 DIAGNOSIS — I6523 Occlusion and stenosis of bilateral carotid arteries: Secondary | ICD-10-CM

## 2024-04-02 DIAGNOSIS — I10 Essential (primary) hypertension: Secondary | ICD-10-CM | POA: Diagnosis not present

## 2024-04-02 DIAGNOSIS — E782 Mixed hyperlipidemia: Secondary | ICD-10-CM

## 2024-04-02 MED ORDER — NITROGLYCERIN 0.4 MG SL SUBL: 0.4000 mg | SUBLINGUAL_TABLET | SUBLINGUAL | 3 refills | Status: DC | PRN

## 2024-04-02 NOTE — Progress Notes (Signed)
    Cardiology Office Note  Date: 04/02/2024   ID: Seth Savage, DOB May 08, 1951, MRN [id number]  History of Present Illness: Seth Savage is a 73 y.o. male last seen in May 2024.  He is here today with his wife for a follow-up visit.  He does not report any angina on current medications, stable NYHA class II dyspnea, no palpitations or syncope.  He tells me that he has had some recent trouble with fevers, reportedly diagnosed with URI and UTI recently.  Has also had a left sided lateral visual disturbance that was transient.  No focal motor weakness.  He plans to see an ophthalmologist.  PCP is Dr. Darien Eden.  We went over his medications.  He reports compliance with therapy.  His LDL was 49 in June of last year.  I reviewed his recent lab work as well, noted below.  I reviewed his ECG today which shows sinus rhythm with left bundle branch block.  This is stable.  Physical Exam: VS:  BP 124/72   Pulse 63   Ht 6' (1.829 m)   Wt 216 lb 6.4 oz (98.2 kg)   SpO2 95%   BMI 29.35 kg/m , BMI Body mass index is 29.35 kg/m.  Wt Readings from Last 3 Encounters:  04/02/24 216 lb 6.4 oz (98.2 kg)  02/27/24 225 lb 6.4 oz (102.2 kg)  12/20/23 221 lb 12.8 oz (100.6 kg)    General: Patient appears comfortable at rest. HEENT: Conjunctiva and lids normal. Neck: Supple, no elevated JVP, right carotid bruit. Lungs: Clear to auscultation, nonlabored breathing at rest. Cardiac: Regular rate and rhythm, no S3 or significant systolic murmur, no pericardial rub. Extremities: No pitting edema.  ECG:  An ECG dated 03/21/2023 was personally reviewed today and demonstrated:  Sinus rhythm with left bundle branch block.  Labwork:  June 2024: Cholesterol 126, triglycerides 148, HDL 52, LDL 49 April 2025: Hemoglobin 15, platelets 204, BUN 12, creatinine 1.1, potassium 4.4, AST 11, ALT 13, GFR 72  Other Studies Reviewed Today:  No interval cardiac testing for review today.  Assessment and Plan:  1.   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  He is doing well without recurrent angina at this point, ECG reviewed and stable.  Continue aspirin  81 mg daily, Imdur  60 mg daily, Crestor  20 mg daily, and as needed nitroglycerin  which will be refilled for fresh bottle.   2.  Mixed hyperlipidemia.  LDL 49 in June 2024.  Continue Crestor  20 mg daily.   3.  Primary hypertension.  Blood pressure is well-controlled today.  Continue Norvasc  10 mg daily, Cozaar  50 mg daily, and Toprol -XL 25 mg daily.   4.  Bilateral carotid artery disease status post left CEA in 2001 and right CEA in 2004.  Carotid Dopplers in October 2023 revealed 1 to 39% bilateral ICA stenosis.  We will set up follow-up carotid Dopplers and get him referred back to see [facility] in the [place] office.  Disposition:  Follow up 1 year.  Signed, Gerard Knight, M.D., F.A.C.C. [facility]

## 2024-04-02 NOTE — Patient Instructions (Signed)
 Medication Instructions:  Your physician recommends that you continue on your current medications as directed. Please refer to the Current Medication list given to you today.   Labwork: None  Testing/Procedures: Your physician has requested that you have a carotid duplex. This test is an ultrasound of the carotid arteries in your neck. It looks at blood flow through these arteries that supply the brain with blood. Allow one hour for this exam. There are no restrictions or special instructions.   Follow-Up: Your physician recommends that you schedule a follow-up appointment in: 1 year  Any Other Special Instructions Will Be Listed Below (If Applicable). Referral to Vascular Surgery  Thank you for choosing [facility]!     If you need a refill on your cardiac medications before your next appointment, please call your pharmacy.

## 2024-04-03 ENCOUNTER — Ambulatory Visit (HOSPITAL_COMMUNITY): Payer: Self-pay

## 2024-04-03 DIAGNOSIS — Z299 Encounter for prophylactic measures, unspecified: Secondary | ICD-10-CM | POA: Diagnosis not present

## 2024-04-03 DIAGNOSIS — E039 Hypothyroidism, unspecified: Secondary | ICD-10-CM | POA: Diagnosis not present

## 2024-04-03 DIAGNOSIS — H53132 Sudden visual loss, left eye: Secondary | ICD-10-CM | POA: Diagnosis not present

## 2024-04-03 LAB — URINE CULTURE: Culture: >=100000 — AB

## 2024-04-03 MED ORDER — SULFAMETHOXAZOLE-TRIMETHOPRIM 800-160 MG PO TABS: 1.0000 | ORAL_TABLET | Freq: Two times a day (BID) | ORAL | 0 refills | Status: AC

## 2024-04-04 ENCOUNTER — Other Ambulatory Visit (HOSPITAL_COMMUNITY): Payer: Self-pay | Admitting: Internal Medicine

## 2024-04-04 DIAGNOSIS — H0520 Unspecified exophthalmos: Secondary | ICD-10-CM

## 2024-04-04 DIAGNOSIS — H53132 Sudden visual loss, left eye: Secondary | ICD-10-CM

## 2024-04-05 ENCOUNTER — Encounter (HOSPITAL_COMMUNITY): Payer: Self-pay

## 2024-04-05 ENCOUNTER — Observation Stay (HOSPITAL_COMMUNITY)
Admission: EM | Admit: 2024-04-05 | Discharge: 2024-04-06 | Disposition: A | Discharge status: Home / Self Care | Attending: Internal Medicine | Admitting: Internal Medicine

## 2024-04-05 ENCOUNTER — Emergency Department (HOSPITAL_COMMUNITY)

## 2024-04-05 ENCOUNTER — Ambulatory Visit (HOSPITAL_COMMUNITY)

## 2024-04-05 ENCOUNTER — Other Ambulatory Visit: Payer: Self-pay

## 2024-04-05 ENCOUNTER — Ambulatory Visit (HOSPITAL_COMMUNITY)
Admission: RE | Admit: 2024-04-05 | Discharge: 2024-04-05 | Disposition: A | Discharge status: Home / Self Care | Source: Ambulatory Visit | Attending: Internal Medicine | Admitting: Internal Medicine

## 2024-04-05 DIAGNOSIS — H538 Other visual disturbances: Secondary | ICD-10-CM | POA: Diagnosis not present

## 2024-04-05 DIAGNOSIS — E785 Hyperlipidemia, unspecified: Secondary | ICD-10-CM | POA: Insufficient documentation

## 2024-04-05 DIAGNOSIS — E039 Hypothyroidism, unspecified: Secondary | ICD-10-CM | POA: Insufficient documentation

## 2024-04-05 DIAGNOSIS — I10 Essential (primary) hypertension: Secondary | ICD-10-CM | POA: Insufficient documentation

## 2024-04-05 DIAGNOSIS — H547 Unspecified visual loss: Secondary | ICD-10-CM

## 2024-04-05 DIAGNOSIS — I639 Cerebral infarction, unspecified: Secondary | ICD-10-CM | POA: Diagnosis present

## 2024-04-05 DIAGNOSIS — H53132 Sudden visual loss, left eye: Secondary | ICD-10-CM | POA: Diagnosis not present

## 2024-04-05 DIAGNOSIS — I779 Disorder of arteries and arterioles, unspecified: Secondary | ICD-10-CM

## 2024-04-05 DIAGNOSIS — H0520 Unspecified exophthalmos: Secondary | ICD-10-CM | POA: Insufficient documentation

## 2024-04-05 DIAGNOSIS — I2510 Atherosclerotic heart disease of native coronary artery without angina pectoris: Secondary | ICD-10-CM | POA: Diagnosis not present

## 2024-04-05 DIAGNOSIS — Z7982 Long term (current) use of aspirin: Secondary | ICD-10-CM | POA: Diagnosis not present

## 2024-04-05 DIAGNOSIS — I6521 Occlusion and stenosis of right carotid artery: Secondary | ICD-10-CM | POA: Diagnosis not present

## 2024-04-05 DIAGNOSIS — J341 Cyst and mucocele of nose and nasal sinus: Secondary | ICD-10-CM | POA: Diagnosis not present

## 2024-04-05 DIAGNOSIS — F10920 Alcohol use, unspecified with intoxication, uncomplicated: Secondary | ICD-10-CM | POA: Diagnosis not present

## 2024-04-05 DIAGNOSIS — Z79899 Other long term (current) drug therapy: Secondary | ICD-10-CM | POA: Insufficient documentation

## 2024-04-05 DIAGNOSIS — S0592XA Unspecified injury of left eye and orbit, initial encounter: Secondary | ICD-10-CM | POA: Diagnosis not present

## 2024-04-05 DIAGNOSIS — J449 Chronic obstructive pulmonary disease, unspecified: Secondary | ICD-10-CM | POA: Insufficient documentation

## 2024-04-05 DIAGNOSIS — I6529 Occlusion and stenosis of unspecified carotid artery: Secondary | ICD-10-CM | POA: Diagnosis present

## 2024-04-05 DIAGNOSIS — I6310 Cerebral infarction due to embolism of unspecified precerebral artery: Principal | ICD-10-CM

## 2024-04-05 DIAGNOSIS — H53452 Other localized visual field defect, left eye: Secondary | ICD-10-CM | POA: Diagnosis present

## 2024-04-05 DIAGNOSIS — I63111 Cerebral infarction due to embolism of right vertebral artery: Principal | ICD-10-CM | POA: Insufficient documentation

## 2024-04-05 DIAGNOSIS — I6782 Cerebral ischemia: Secondary | ICD-10-CM | POA: Diagnosis not present

## 2024-04-05 LAB — DIFFERENTIAL
Abs Immature Granulocytes: 0 10*3/uL (ref 0.00–0.07)
Band Neutrophils: 1 %
Basophils Absolute: 0 10*3/uL (ref 0.0–0.1)
Basophils Relative: 0 %
Eosinophils Absolute: 0.1 10*3/uL (ref 0.0–0.5)
Eosinophils Relative: 2 %
Lymphocytes Relative: 18 %
Lymphs Abs: 0.9 10*3/uL (ref 0.7–4.0)
Monocytes Absolute: 0.2 10*3/uL (ref 0.1–1.0)
Monocytes Relative: 4 %
Neutro Abs: 3.7 10*3/uL (ref 1.7–7.7)
Neutrophils Relative %: 75 %

## 2024-04-05 LAB — COMPREHENSIVE METABOLIC PANEL WITH GFR
ALT: 21 U/L (ref 0–44)
AST: 17 U/L (ref 15–41)
Albumin: 4.2 g/dL (ref 3.5–5.0)
Alkaline Phosphatase: 97 U/L (ref 38–126)
Anion gap: 5 (ref 5–15)
BUN: 19 mg/dL (ref 8–23)
CO2: 24 mmol/L (ref 22–32)
Calcium: 9.1 mg/dL (ref 8.9–10.3)
Chloride: 104 mmol/L (ref 98–111)
Creatinine, Ser: 1.25 mg/dL — ABNORMAL HIGH (ref 0.61–1.24)
GFR, Estimated: >60 mL/min (ref >60)
Glucose, Bld: 117 mg/dL — ABNORMAL HIGH (ref 70–99)
Potassium: 4.6 mmol/L (ref 3.5–5.1)
Sodium: 133 mmol/L — ABNORMAL LOW (ref 135–145)
Total Bilirubin: 0.6 mg/dL (ref 0.0–1.2)
Total Protein: 7.9 g/dL (ref 6.5–8.1)

## 2024-04-05 LAB — I-STAT CHEM 8, ED
BUN: 21 mg/dL (ref 8–23)
Calcium, Ion: 1.21 mmol/L (ref 1.15–1.40)
Chloride: 104 mmol/L (ref 98–111)
Creatinine, Ser: 1.3 mg/dL — ABNORMAL HIGH (ref 0.61–1.24)
Glucose, Bld: 119 mg/dL — ABNORMAL HIGH (ref 70–99)
HCT: 46 % (ref 39.0–52.0)
Hemoglobin: 15.6 g/dL (ref 13.0–17.0)
Potassium: 4.8 mmol/L (ref 3.5–5.1)
Sodium: 139 mmol/L (ref 135–145)
TCO2: 24 mmol/L (ref 22–32)

## 2024-04-05 LAB — RAPID URINE DRUG SCREEN, HOSP PERFORMED
Amphetamines: NOT DETECTED
Barbiturates: NOT DETECTED
Benzodiazepines: NOT DETECTED
Cocaine: NOT DETECTED
Opiates: NOT DETECTED
Tetrahydrocannabinol: NOT DETECTED

## 2024-04-05 LAB — CBC
HCT: 45.1 % (ref 39.0–52.0)
Hemoglobin: 15.5 g/dL (ref 13.0–17.0)
MCH: 30.5 pg (ref 26.0–34.0)
MCHC: 34.4 g/dL (ref 30.0–36.0)
MCV: 88.6 fL (ref 80.0–100.0)
Platelets: 183 10*3/uL (ref 150–400)
RBC: 5.09 MIL/uL (ref 4.22–5.81)
RDW: 12.5 % (ref 11.5–15.5)
WBC: 4.9 10*3/uL (ref 4.0–10.5)
nRBC: 0 % (ref 0.0–0.2)

## 2024-04-05 LAB — ETHANOL: Alcohol, Ethyl (B): <15 mg/dL (ref <15)

## 2024-04-05 MED ORDER — SULFAMETHOXAZOLE-TRIMETHOPRIM 800-160 MG PO TABS
1.0000 | ORAL_TABLET | Freq: Two times a day (BID) | ORAL | Status: DC
  Administered 2024-04-05 – 2024-04-06 (×2): 1 via ORAL
  Filled 2024-04-05 (×2): qty 1

## 2024-04-05 MED ORDER — ROSUVASTATIN CALCIUM 20 MG PO TABS
20.0000 mg | ORAL_TABLET | Freq: Every day | ORAL | Status: DC
Start: 2024-04-05 — End: 2024-04-06
  Administered 2024-04-05: 20 mg via ORAL
  Filled 2024-04-05: qty 1

## 2024-04-05 MED ORDER — SENNOSIDES-DOCUSATE SODIUM 8.6-50 MG PO TABS: 1.0000 | ORAL_TABLET | Freq: Every evening | ORAL | Status: DC | PRN

## 2024-04-05 MED ORDER — GUAIFENESIN ER 600 MG PO TB12: 600.0000 mg | ORAL_TABLET | Freq: Two times a day (BID) | ORAL | Status: DC | PRN

## 2024-04-05 MED ORDER — ACETAMINOPHEN 325 MG PO TABS: 650.0000 mg | ORAL_TABLET | ORAL | Status: DC | PRN

## 2024-04-05 MED ORDER — CLOPIDOGREL BISULFATE 75 MG PO TABS
75.0000 mg | ORAL_TABLET | Freq: Every day | ORAL | Status: DC
  Administered 2024-04-06: 75 mg via ORAL
  Filled 2024-04-05: qty 1

## 2024-04-05 MED ORDER — STROKE: EARLY STAGES OF RECOVERY BOOK: Freq: Once | Status: AC

## 2024-04-05 MED ORDER — SODIUM CHLORIDE 0.9% FLUSH
3.0000 mL | Freq: Two times a day (BID) | INTRAVENOUS | Status: DC
  Administered 2024-04-05 – 2024-04-06 (×2): 10 mL via INTRAVENOUS

## 2024-04-05 MED ORDER — PANTOPRAZOLE SODIUM 40 MG PO TBEC
40.0000 mg | DELAYED_RELEASE_TABLET | Freq: Every day | ORAL | Status: DC
  Administered 2024-04-06: 40 mg via ORAL
  Filled 2024-04-05: qty 1

## 2024-04-05 MED ORDER — LEVOTHYROXINE SODIUM 75 MCG PO TABS
175.0000 ug | ORAL_TABLET | Freq: Every day | ORAL | Status: DC
  Administered 2024-04-06: 175 ug via ORAL
  Filled 2024-04-05: qty 1

## 2024-04-05 MED ORDER — METOPROLOL SUCCINATE ER 25 MG PO TB24
25.0000 mg | ORAL_TABLET | Freq: Every day | ORAL | Status: DC
  Administered 2024-04-06: 25 mg via ORAL
  Filled 2024-04-05: qty 1

## 2024-04-05 MED ORDER — ACETAMINOPHEN 160 MG/5ML PO SOLN: 650.0000 mg | ORAL | Status: DC | PRN

## 2024-04-05 MED ORDER — ENOXAPARIN SODIUM 40 MG/0.4ML IJ SOSY
40.0000 mg | PREFILLED_SYRINGE | INTRAMUSCULAR | Status: DC
  Administered 2024-04-05: 40 mg via SUBCUTANEOUS
  Filled 2024-04-05: qty 0.4

## 2024-04-05 MED ORDER — IOHEXOL 350 MG/ML SOLN
75.0000 mL | Freq: Once | INTRAVENOUS | Status: AC | PRN
  Administered 2024-04-05: 75 mL via INTRAVENOUS

## 2024-04-05 MED ORDER — LOSARTAN POTASSIUM 50 MG PO TABS
50.0000 mg | ORAL_TABLET | Freq: Every day | ORAL | Status: DC
  Administered 2024-04-06: 50 mg via ORAL
  Filled 2024-04-05: qty 1

## 2024-04-05 MED ORDER — ISOSORBIDE MONONITRATE ER 60 MG PO TB24
60.0000 mg | ORAL_TABLET | Freq: Every day | ORAL | Status: DC
  Administered 2024-04-06: 60 mg via ORAL
  Filled 2024-04-05: qty 1

## 2024-04-05 MED ORDER — AMLODIPINE BESYLATE 5 MG PO TABS
10.0000 mg | ORAL_TABLET | Freq: Every day | ORAL | Status: DC
  Administered 2024-04-06: 10 mg via ORAL
  Filled 2024-04-05: qty 2

## 2024-04-05 MED ORDER — CLOPIDOGREL BISULFATE 75 MG PO TABS
75.0000 mg | ORAL_TABLET | Freq: Once | ORAL | Status: AC
  Administered 2024-04-05: 75 mg via ORAL
  Filled 2024-04-05: qty 1

## 2024-04-05 MED ORDER — SODIUM CHLORIDE 0.9% FLUSH: 3.0000 mL | INTRAVENOUS | Status: DC | PRN

## 2024-04-05 MED ORDER — ACETAMINOPHEN 650 MG RE SUPP: 650.0000 mg | RECTAL | Status: DC | PRN

## 2024-04-05 MED ORDER — ASPIRIN 81 MG PO TBEC
81.0000 mg | DELAYED_RELEASE_TABLET | Freq: Every day | ORAL | Status: DC
  Administered 2024-04-05: 81 mg via ORAL
  Filled 2024-04-05: qty 1

## 2024-04-05 NOTE — ED Notes (Signed)
 Called wife to update her and let her know husband has been admitted to room 311

## 2024-04-05 NOTE — ED Notes (Signed)
 Patient is sitting at the edge of bed and he is anxious. Patient is stating that he walked in here and he wants to walk out of here. Medic made patient aware of risk factors associated with leaving the hospital, and notified EDP, and EDP at bedside.

## 2024-04-05 NOTE — Care Management Obs Status (Signed)
 MEDICARE OBSERVATION STATUS NOTIFICATION   Patient Details  Name: Seth Savage MRN: [id number] Date of Birth: 11-Jun-1951   Medicare Observation Status Notification Given:   Yes.    Lynda Sands, RN 04/05/2024, 6:44 PM

## 2024-04-05 NOTE — ED Notes (Signed)
 Patient spoke with EDP and is back in the bed relaxing and has decided to stay.

## 2024-04-05 NOTE — Progress Notes (Signed)
   04/05/24 1847  TOC Brief Assessment  Insurance and Status Reviewed  Patient has primary care physician Yes  Home environment has been reviewed Single Family Home  Prior level of function: Independent  Prior/Current Home Services No current home services  Social Drivers of Health Review SDOH reviewed no interventions necessary  Readmission risk has been reviewed Yes  Transition of care needs no transition of care needs at this time   Admitted with Acute stroke due to ischemia. Transition of Care Department [facility]) has reviewed patient, and no TOC needs have been identified at this time. We will continue to monitor patient advancement through interdisciplinary progressions rounds. If new patient transition needs arise, please place a TOC consult.

## 2024-04-05 NOTE — ED Provider Notes (Signed)
  EMERGENCY DEPARTMENT AT [facility] Provider Note   CSN: [id number] Arrival date & time: 04/05/24  1355     History  Chief Complaint  Patient presents with   Abnormal Imaging    Seth Savage is a 73 y.o. male.  73 year old male with history of bilateral carotid endarterectomies, CAD, COPD, hypertension, and hyperlipidemia who presents emergency department with vision changes.  Patient reports that on 03/25/2024 he noticed that his left side of his vision seemed funny.  Says that afterwards the whole thing appeared like shadow.  After several days it resolved.  No flashes or floaters.  No eye pain.  His ophthalmologist sent him to get an MRI which was performed today which shows a right sided occipital stroke.  Denies any symptoms at this time.  Not on any blood thinners aside from aspirin .  No history of stroke.       Home Medications Prior to Admission medications   Medication Sig Start Date End Date Taking? Authorizing Provider  amLODipine  (NORVASC ) 10 MG tablet TAKE 1 TABLET BY MOUTH EVERY DAY 05/10/23   Gerard Knight, MD  aspirin  EC 81 MG tablet Take 81 mg by mouth at bedtime.     [provider]  azelastine  (ASTELIN ) 0.1 % nasal spray Place 1 spray into both nostrils 2 (two) times daily. Use in each nostril as directed 04/01/24   Corbin Dess, PA-C  cephALEXin  (KEFLEX ) 500 MG capsule Take 1 capsule (500 mg total) by mouth 2 (two) times daily. 04/01/24   Corbin Dess, PA-C  dicyclomine  (BENTYL ) 10 MG capsule Take 1 capsule (10 mg total) by mouth 4 (four) times daily as needed for spasms. 10/16/23   Rourk, Windsor Hatcher, MD  guaiFENesin  (MUCINEX ) 600 MG 12 hr tablet Take 1 tablet (600 mg total) by mouth 2 (two) times daily as needed. 04/01/24   Corbin Dess, PA-C  isosorbide  mononitrate (IMDUR ) 60 MG 24 hr tablet TAKE 1 TABLET BY MOUTH DAILY 01/25/24   Gerard Knight, MD  levothyroxine  (SYNTHROID ) 175 MCG tablet Take 175  mcg by mouth daily. 01/14/21   [provider]  losartan  (COZAAR ) 50 MG tablet TAKE 1 TABLET BY MOUTH EVERY DAY 11/05/23   Gerard Knight, MD  mesalamine  (LIALDA ) 1.2 g EC tablet Take 2 tablets (2.4 g total) by mouth daily with breakfast. 03/11/24   Evander Hills, PA-C  metoprolol  succinate (TOPROL -XL) 25 MG 24 hr tablet TAKE 1 TABLET BY MOUTH EVERY DAY 11/05/23   Gerard Knight, MD  nitroGLYCERIN  (NITROSTAT ) 0.4 MG SL tablet Place 1 tablet (0.4 mg total) under the tongue every 5 (five) minutes x 3 doses as needed for chest pain (if no relief after 3rd dose, proceed to ED or call 911). 04/02/24   Gerard Knight, MD  pantoprazole  (PROTONIX ) 40 MG tablet Take 40 mg by mouth daily.    [provider]  rosuvastatin  (CRESTOR ) 20 MG tablet TAKE 1 TABLET BY MOUTH AT BEDTIME 11/05/23   Gerard Knight, MD  sulfamethoxazole -trimethoprim  (BACTRIM  DS) 800-160 MG tablet Take 1 tablet by mouth 2 (two) times daily for 7 days. 04/03/24 04/10/24  Eloise Hake Scales, PA-C      Allergies    Lyrica [pregabalin], Celecoxib, and Codeine    Review of Systems   Review of Systems  Physical Exam Updated Vital Signs BP (!) 149/66   Pulse (!) 56   Temp 97.7 F (36.5 C) (Oral)   Resp 17  Ht 6' (1.829 m)   Wt 98.2 kg   SpO2 97%   BMI 29.35 kg/m  Physical Exam Vitals and nursing note reviewed.  Constitutional:      General: He is not in acute distress.    Appearance: He is well-developed.  HENT:     Head: Normocephalic and atraumatic.     Right Ear: External ear normal.     Left Ear: External ear normal.     Nose: Nose normal.  Eyes:     Extraocular Movements: Extraocular movements intact.     Conjunctiva/sclera: Conjunctivae normal.     Pupils: Pupils are equal, round, and reactive to light.  Cardiovascular:     Rate and Rhythm: Normal rate and regular rhythm.     Heart sounds: Normal heart sounds.  Pulmonary:     Effort: Pulmonary effort is normal. No  respiratory distress.     Breath sounds: Normal breath sounds.  Musculoskeletal:     Cervical back: Normal range of motion and neck supple.     Right lower leg: No edema.     Left lower leg: No edema.  Skin:    General: Skin is warm and dry.  Neurological:     Mental Status: He is alert.     Comments: NIHSS Exam  Level of Consciousness: Alert  LOC Questions: Answers Month and Age Correctly  LOC Commands: Opens and Closes Eyes and Hands on command  Best Gaze: Horizontal ocular movements intact  Visual Fields: No visual field loss  Facial Palsy: None  L Upper Extremity Motor: No drift after 10 seconds  R Upper Extremity Motor: No drift after 10 seconds  L Lower extremity Motor: No drift after 5 seconds  R Lower extremity Motor: No drift after 5 seconds  Ataxia: Absent  Sensory: Intact sensation to light touch on face, arms, trunk, and legs bilaterally  Best Language: No aphasia  Dysarthria: No dysarthria  Neglect: No visual or sensory neglect    Psychiatric:        Mood and Affect: Mood normal.        Behavior: Behavior normal.     ED Results / Procedures / Treatments   Labs (all labs ordered are listed, but only abnormal results are displayed) Labs Reviewed  COMPREHENSIVE METABOLIC PANEL WITH GFR - Abnormal; Notable for the following components:      Result Value   Sodium 133 (*)    Glucose, Bld 117 (*)    Creatinine, Ser 1.25 (*)    All other components within normal limits  I-STAT CHEM 8, ED - Abnormal; Notable for the following components:   Creatinine, Ser 1.30 (*)    Glucose, Bld 119 (*)    All other components within normal limits  ETHANOL  CBC  DIFFERENTIAL  RAPID URINE DRUG SCREEN, HOSP PERFORMED    EKG EKG Interpretation Date/Time:  Saturday Apr 05 2024 14:01:07 EDT Ventricular Rate:  62 PR Interval:  169 QRS Duration:  142 QT Interval:  480 QTC Calculation: 488 R Axis:   99  Text Interpretation: Sinus rhythm Nonspecific intraventricular  conduction delay Minimal ST depression, inferior leads Confirmed by Shyrl Doyne [phone]) on 04/05/2024 4:15:57 PM  Radiology CT ANGIO HEAD NECK W WO CM Result Date: 04/05/2024 CLINICAL DATA:  Provided history: Neuro deficit, acute, stroke suspected. Vision loss. EXAM: CT ANGIOGRAPHY HEAD AND NECK WITH AND WITHOUT CONTRAST TECHNIQUE: Multidetector CT imaging of the head and neck was performed using the standard protocol during bolus administration of intravenous  contrast. Multiplanar CT image reconstructions and MIPs were obtained to evaluate the vascular anatomy. Carotid stenosis measurements (when applicable) are obtained utilizing NASCET criteria, using the distal internal carotid diameter as the denominator. RADIATION DOSE REDUCTION: This exam was performed according to the departmental dose-optimization program which includes automated exposure control, adjustment of the mA and/or kV according to patient size and/or use of iterative reconstruction technique. CONTRAST:  75mL OMNIPAQUE  IOHEXOL  350 MG/ML SOLN COMPARISON:  Brain MRI 04/05/2024.  CTA neck 06/07/2016. FINDINGS: CT HEAD FINDINGS Brain: Mild generalized cerebral atrophy. Known small acute/subacute cortical infarcts within the right occipital lobe (PCA vascular territory), some of which were better appreciated on the brain MRI performed earlier today. Patchy and ill-defined hypoattenuation within the cerebral white matter, nonspecific but compatible with mild chronic small vessel ischemic disease. There is no acute intracranial hemorrhage. No extra-axial fluid collection. No evidence of an intracranial mass. No midline shift. Vascular: No hyperdense vessel.  Atherosclerotic calcifications. Skull: No calvarial fracture or aggressive osseous lesion. Sinuses/Orbits: Bilateral proptosis. 17 mm mucous retention cyst or polyp within the left maxillary sinus. Review of the MIP images confirms the above findings CTA NECK FINDINGS Aortic arch: The left  vertebral artery arises directly from the aortic arch. Atherosclerotic plaque within the visualized aortic arch and proximal major branch vessels of the neck. No hemodynamically significant innominate or proximal subclavian artery stenosis. Right carotid system: Right carotid endarterectomy has been performed since the prior CTA of 06/07/2016. CCA and ICA patent within the neck without stenosis. Mild atherosclerotic plaque scattered within the CCA, but the carotid bifurcation and within the distal cervical ICA. Left carotid system: CCA and ICA patent within the neck without hemodynamically significant stenosis (50% or greater). Atherosclerotic plaque scattered within the CCA and within the proximal ICA, similar to the prior examination of 06/07/2016. Vertebral arteries: Venous reflux of contrast obscures portions of the right vertebral artery V1 segment. Within this limitation, the right vertebral artery is patent within the neck. Progressive atherosclerotic plaque within the right vertebral artery at the V3/V4 junction resulting in moderate/severe stenosis. Additionally, the right vertebral artery is diffusely decreased in caliber as compared to the prior examination of 06/07/2016. This could potentially be secondary to an obscured high-grade proximal right vertebral artery stenosis. The left vertebral artery is patent within the neck without stenosis. Nonstenotic atherosclerotic plaque at the origin of this vessel Skeleton: Partially imaged thoracic scoliosis. Cervical spondylosis. Mild grade 1 anterolisthesis at C3-C4 and C4-C5. No acute fracture or aggressive osseous lesion. Other neck: No neck mass or cervical lymphadenopathy. Thyroid  gland is poorly delineated, presumed surgically absent. Upper chest: No consolidation within the imaged lung apices. Review of the MIP images confirms the above findings CTA HEAD FINDINGS Anterior circulation: The intracranial internal carotid arteries are patent. Calcified plaque  within both vessels. Most notably, calcified plaque within the distal cavernous/paraclinoid right ICA results in up to moderate stenosis. The M1 middle cerebral arteries are patent. No M2 proximal branch occlusion or high-grade proximal stenosis. The anterior cerebral arteries are patent. No intracranial aneurysm is identified. Posterior circulation: The intracranial vertebral arteries are patent. Progressive atherosclerotic plaque within the right vertebral artery at the V3/V4 junction resulting in moderate/severe stenosis. The basilar artery is patent. The posterior cerebral arteries are patent. Bilateral PCA atherosclerotic irregularity. Most notably, there are severe stenoses within bilateral PCA branches at the P3 segment level. Small posterior communicating arteries are present bilaterally. Venous sinuses: Within the limitations of contrast timing, no convincing thrombus. Anatomic variants: None significant.  Review of the MIP images confirms the above findings IMPRESSION: Non-contrast head CT: 1. Small acute/subacute cortical infarcts within the right occipital lobe (PCA vascular territory), some of which were better appreciated on the brain MRI performed earlier today. 2. Mild cerebral white matter chronic small vessel ischemic disease. 3. Mild generalized cerebral atrophy. 4. 17 mm mucous retention cyst or polyp within the left maxillary sinus. CTA neck: 1. Right carotid endarterectomy has been performed since the prior CTA neck of 06/07/2016. 2. The common carotid and internal carotid arteries are patent within the neck without hemodynamically significant stenosis (50% or greater). Atherosclerotic plaque bilaterally, as described. 3. Venous reflux of contrast partially obscures the right vertebral artery V1 segment. Within this limitation, the vertebral arteries are patent within the neck. Progressive atherosclerotic plaque within the right vertebral artery at the V3/V4 junction resulting and  moderate/severe stenosis. Additionally, the right vertebral artery is diffusely decreased in caliber as compared to the prior examination of 06/07/2016. This could potentially be secondary to an obscured high-grade proximal right vertebral artery stenosis. 4. Aortic Atherosclerosis (ICD10-I70.0). CTA head: 1. No proximal intracranial large vessel occlusion identified. 2. Intracranial atherosclerotic disease with multifocal stenoses, most notably as follows. 3. Progressive atherosclerotic plaque within the right vertebral artery at the V3/V4 junction resulting in moderate/severe stenosis. 4. Severe stenoses within bilateral posterior cerebral artery branches at the P3 segment level. 5. Up to moderate stenosis within the cavernous/paraclinoid right internal carotid artery. Electronically Signed   By: Bascom Lily D.O.   On: 04/05/2024 15:28   MR BRAIN WO CONTRAST Addendum Date: 04/05/2024 ADDENDUM REPORT: 04/05/2024 13:49 ADDENDUM: Correction: There is a dictation error within MRI brain impression #1, which should read small patchy acute/subacute cortical infarcts within the right occipital lobe (PCA vascular territory). These results were called by telephone at the time of interpretation on 04/05/2024 at 1:45 pm to provider [facility] VYAS , who verbally acknowledged these results. Electronically Signed   By: Bascom Lily D.O.   On: 04/05/2024 13:49   Result Date: 04/05/2024 CLINICAL DATA:  Provided history: Sudden visual loss of left eye. Unspecified exophthalmos. Additional history provided: Left eye bulging following recent trauma. EXAM: MRI HEAD AND ORBITS WITHOUT CONTRAST TECHNIQUE: Multiplanar, multi-echo pulse sequences of the brain and surrounding structures were acquired without intravenous contrast. Multiplanar, multi-echo pulse sequences of the orbits and surrounding structures were acquired including fat saturation techniques, without intravenous contrast administration. COMPARISON:  Brain MRI 09/01/2013.  FINDINGS: MRI HEAD FINDINGS Brain: No age-advanced or lobar predominant cerebral atrophy. Patchy small acute/subacute cortical infarcts within the right occipital lobe (PCA vascular territory). Multifocal T2 FLAIR hyperintense signal abnormality within the cerebral white matter, nonspecific but compatible with mild chronic small vessel ischemic disease. There is no acute infarct. No evidence of an intracranial mass. No chronic intracranial blood products. No extra-axial fluid collection. No midline shift. Vascular: Maintained flow voids within the proximal large arterial vessels. 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 Skull and upper cervical spine: No focal worrisome marrow lesion. Incompletely assessed cervical spondylosis. Other: Small-volume fluid within bilateral mastoid air cells. 7 mm Tornwaldt cyst. MRI ORBITS FINDINGS Orbits: Bilateral proptosis. The globes are normal in size and contour. The extraocular muscles, optic nerve sheath complexes and lacrimal glands are symmetric and unremarkable on this non-contrast examination. No evidence of an orbital mass. Visualized sinuses: 1.7 cm mucous retention cyst within the left maxillary sinus. Soft tissues: Unremarkable appearance of the visible maxillofacial soft tissues.  Attempts are being made to reach the ordering provider at this time. IMPRESSION: MRI brain: 1. Small patchy acute cortical infarcts within the right occipital lobe (PCA vascular territory). 2. Mild chronic small vessel ischemic changes within the cerebral white matter, slightly progressed since the brain MRI of 09/01/2013. 3. 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 4. Small-volume fluid within bilateral mastoid air cells. MRI orbits: 1. Bilateral proptosis. 2. Otherwise unremarkable non-contrast MRI appearance of the orbits. 3. 17 mm left maxillary sinus  mucous retention cyst. Electronically Signed: By: Bascom Lily D.O. On: 04/05/2024 13:41   MR ORBITS WO CONTRAST Addendum Date: 04/05/2024 ADDENDUM REPORT: 04/05/2024 13:49 ADDENDUM: Correction: There is a dictation error within MRI brain impression #1, which should read small patchy acute/subacute cortical infarcts within the right occipital lobe (PCA vascular territory). These results were called by telephone at the time of interpretation on 04/05/2024 at 1:45 pm to provider [facility] VYAS , who verbally acknowledged these results. Electronically Signed   By: Bascom Lily D.O.   On: 04/05/2024 13:49   Result Date: 04/05/2024 CLINICAL DATA:  Provided history: Sudden visual loss of left eye. Unspecified exophthalmos. Additional history provided: Left eye bulging following recent trauma. EXAM: MRI HEAD AND ORBITS WITHOUT CONTRAST TECHNIQUE: Multiplanar, multi-echo pulse sequences of the brain and surrounding structures were acquired without intravenous contrast. Multiplanar, multi-echo pulse sequences of the orbits and surrounding structures were acquired including fat saturation techniques, without intravenous contrast administration. COMPARISON:  Brain MRI 09/01/2013. FINDINGS: MRI HEAD FINDINGS Brain: No age-advanced or lobar predominant cerebral atrophy. Patchy small acute/subacute cortical infarcts within the right occipital lobe (PCA vascular territory). Multifocal T2 FLAIR hyperintense signal abnormality within the cerebral white matter, nonspecific but compatible with mild chronic small vessel ischemic disease. There is no acute infarct. No evidence of an intracranial mass. No chronic intracranial blood products. No extra-axial fluid collection. No midline shift. Vascular: Maintained flow voids within the proximal large arterial vessels. 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 Skull and upper cervical spine: No focal worrisome marrow  lesion. Incompletely assessed cervical spondylosis. Other: Small-volume fluid within bilateral mastoid air cells. 7 mm Tornwaldt cyst. MRI ORBITS FINDINGS Orbits: Bilateral proptosis. The globes are normal in size and contour. The extraocular muscles, optic nerve sheath complexes and lacrimal glands are symmetric and unremarkable on this non-contrast examination. No evidence of an orbital mass. Visualized sinuses: 1.7 cm mucous retention cyst within the left maxillary sinus. Soft tissues: Unremarkable appearance of the visible maxillofacial soft tissues. Attempts are being made to reach the ordering provider at this time. IMPRESSION: MRI brain: 1. Small patchy acute cortical infarcts within the right occipital lobe (PCA vascular territory). 2. Mild chronic small vessel ischemic changes within the cerebral white matter, slightly progressed since the brain MRI of 09/01/2013. 3. 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 4. Small-volume fluid within bilateral mastoid air cells. MRI orbits: 1. Bilateral proptosis. 2. Otherwise unremarkable non-contrast MRI appearance of the orbits. 3. 17 mm left maxillary sinus mucous retention cyst. Electronically Signed: By: Bascom Lily D.O. On: 04/05/2024 13:41    Procedures Procedures    Medications Ordered in ED Medications  iohexol  (OMNIPAQUE ) 350 MG/ML injection 75 mL (75 mLs Intravenous Contrast Given 04/05/24 1444)    ED Course/ Medical Decision Making/ A&P Clinical Course as of 04/05/24 1619  Sat Apr 05, 2024  1600 Signed out to Dr  Annabell Key [RP]    Clinical Course User Index [RP] Ninetta Basket, MD                                 Medical Decision Making Amount and/or Complexity of Data Reviewed Labs: ordered. Radiology: ordered.  Risk Prescription drug management. Decision regarding hospitalization.   EBAN WEICK is a 73 y.o. male with comorbidities that complicate the patient  evaluation including bilateral carotid endarterectomies, CAD, COPD, hypertension, and hyperlipidemia who presents emergency department with vision changes.    Initial Ddx:  Stroke, retinal detachment, vitreous hemorrhage, CRAO, ICH  MDM/Course:  Patient presents to the emergency department with painless vision loss out of his left eye that occurred over a week ago.  Reports that his symptoms have improved at this time.  Was sent down because his MRI showed a right sided occipital stroke which would explain the symptoms that he was having.  At this point time has an NIH of 0.  Actually does not appear to have a visual field cut.  Also is out of the window for TNK.  Obtained a CTA especially with his history of carotid endarterectomy and discussed with Dr. Renaee Caro of neurology who is reviewing the case at this time.  Signed out to the oncoming physician (Dr. Annabell Key) awaiting final recommendations from neurology.    This patient presents to the ED for concern of complaints listed in HPI, this involves an extensive number of treatment options, and is a complaint that carries with it a high risk of complications and morbidity. Disposition including potential need for admission considered.   Dispo: Admit to Floor  Additional history obtained from spouse Records reviewed Outpatient Clinic Notes The following labs were independently interpreted: Chemistry and show CKD I independently reviewed the following imaging with scope of interpretation limited to determining acute life threatening conditions related to emergency care: ct head and agree with the radiologist interpretation with the following exceptions: none I personally reviewed and interpreted cardiac monitoring: normal sinus rhythm  I personally reviewed and interpreted the pt's EKG: see above for interpretation  I have reviewed the patients home medications and made adjustments as needed Consults: Neurology Social Determinants of health:   Geriatric  Portions of this note were generated with Scientist, clinical (histocompatibility and immunogenetics). Dictation errors may occur despite best attempts at proofreading.     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

## 2024-04-05 NOTE — ED Triage Notes (Signed)
 Pt sent over from outpatient MRI for positive result. Pt states Tuesday he had peripheral vision losses. PCP ordered the outpatient scan.

## 2024-04-05 NOTE — ED Provider Notes (Signed)
 This patient has had some visual symptoms for approximately 1 week, presents having significant improvement, on exam patient is ambulatory with normal gait, no ataxia, MRI confirms posterior right sided occipital stroke.  Discussed with Dr. Renaee Caro who agrees with admission echocardiogram and starting Plavix, will discuss with hospitalist for admission   Early Glisson, MD 04/05/24 1641

## 2024-04-05 NOTE — H&P (Signed)
 History and Physical    Patient: Seth Savage ZOX:[id number] DOB: [birth year] DOA: 04/05/2024 DOS: the patient was seen and examined on 04/05/2024 PCP: Orlena Bitters, MD  Patient coming from: Home  Chief Complaint:  Chief Complaint  Patient presents with   Abnormal Imaging   HPI: Seth Savage is a 73 y.o. male with medical history significant of COPD, carotid artery disease status post endarterectomy bilaterally, hypertension, hypothyroidism, hyperlipidemia.  Patient presents with left sided visual field defect that started 3 days ago and has gradually improved since the onset.  He describes it as a dark area of the left visual field of his left eye consistent with left temporal hemianopia. Currently, he feels like his vision is back to normal, however he came to the hospital for evaluation.  He denies headaches, numbness, weakness, chest pain, shortness of breath, other vision changes.  Review of Systems: As mentioned in the history of present illness. All other systems reviewed and are negative. 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   Carotid artery disease (HCC)    Left CEA 2001 and right CEA 2004   Childhood asthma    COPD (chronic obstructive pulmonary disease) (HCC)    Essential hypertension    Fatty liver    GERD (gastroesophageal reflux disease)    Goiter    Radioactive iodine  treatment   H pylori ulcer 11/2014   Treated with Prevpac; documented eradication by biopsy in 2018   History of bronchitis    History of kidney stones    History of pneumonia as a child    Hypothyroidism    Migraine    Mixed hyperlipidemia    Neuropathy    Past Surgical History:  Procedure Laterality Date   BIOPSY  05/01/2017   Procedure: BIOPSY;  Surgeon: Suzette Espy, MD;  Location: [facility];  Service: Endoscopy;;  gastric   BIOPSY  08/19/2018    Procedure: BIOPSY;  Surgeon: Suzette Espy, MD;  Location: [facility];  Service: Endoscopy;;  duodenal and gastric   BIOPSY  11/08/2023   Procedure: BIOPSY;  Surgeon: Suzette Espy, MD;  Location: [facility];  Service: Endoscopy;;   CARDIAC CATHETERIZATION N/A 07/05/2016   Procedure: Left Heart Cath and Coronary Angiography;  Surgeon: Odie Benne, MD;  Location: [facility] INVASIVE CV LAB;  Service: Cardiovascular;  Laterality: N/A;   CARDIAC CATHETERIZATION  03/12/2018   CAROTID ENDARTERECTOMY Left ?2009   CARPAL TUNNEL RELEASE Right    CHOLECYSTECTOMY N/A 04/24/2018   Procedure: LAPAROSCOPIC CHOLECYSTECTOMY;  Surgeon: Alanda Allegra, MD;  Location: [facility];  Service: General;  Laterality: N/A;   COLECTOMY  2000   Secondary to intussuception   COLONOSCOPY  08/24/2004   Normal rectum,Small polyp at 25 cm, cold snared/ The remainder of the colonic mucosa appeared normal   COLONOSCOPY  09/24/2012   Dr. Burrell Casa hemorrhoids-likely source of hematochezia.Colonic diverticulosis   COLONOSCOPY N/A 05/01/2017   Dr. Riley Cheadle: Diverticulosis, next colonoscopy in 5 years.   COLONOSCOPY WITH PROPOFOL  N/A 04/28/2019   Propofol ; Dr. Riley Cheadle; moderate, medium sized grade 1 internal hemorrhoids, diverticulosis in sigmoid and descending colon, otherwise normal.  Repeat in 5 years.   COLONOSCOPY WITH PROPOFOL  N/A 11/08/2023   Procedure: COLONOSCOPY WITH PROPOFOL ;  Surgeon: Suzette Espy, MD;  Location: [facility];  Service: Endoscopy;  Laterality: N/A;  12:45 pm .asa 2   ENDARTERECTOMY Right 07/07/2016   Procedure: ENDARTERECTOMY CAROTID;  Surgeon: Mayo Speck, MD;  Location: [facility];  Service: Vascular;  Laterality: Right;   ESOPHAGOGASTRODUODENOSCOPY N/A 12/09/2014   Dr. Riley Cheadle: Erosive reflux esophagitis. Peptic Ulcer disease secondary to H.pylori. small hiatal hernia. Nonbleeding duodenal AVM. Duodenal erosions. TREATED WITH PREVPAC   ESOPHAGOGASTRODUODENOSCOPY N/A 03/15/2015   Dr. Riley Cheadle: small  hiatal hernia, healed PUD, duodenal AVM.    ESOPHAGOGASTRODUODENOSCOPY N/A 05/01/2017   Dr. Riley Cheadle: LA grade a esophagitis, gastritis, no H pylori   ESOPHAGOGASTRODUODENOSCOPY (EGD) WITH PROPOFOL  N/A 08/19/2018   PROPOFOL ;  Surgeon: Suzette Espy, MD; normal esophagus, mild erythema and erosions in the entire stomach, small hiatal hernia, subtle white papular areas and duodenal bulb second and third portions of the duodenum.   FRACTURE SURGERY     LEFT HEART CATH AND CORONARY ANGIOGRAPHY N/A 03/12/2018   Procedure: LEFT HEART CATH AND CORONARY ANGIOGRAPHY;  Surgeon: Odie Benne, MD;  Location: [facility] INVASIVE CV LAB;  Service: Cardiovascular;  Laterality: N/A;   Multiple bilateral arm/wrist surgeries after falls     ORIF SHOULDER FRACTURE Left 06/2002   "shattered; put a bunch of metal plates in it"   PATCH ANGIOPLASTY Right 07/07/2016   Procedure: PATCH ANGIOPLASTY USING 0.8CM X 7.6CM HEMASHIELD PATCH;  Surgeon: Mayo Speck, MD;  Location[facility] OR;  Service: Vascular;  Laterality: Right;   SHOULDER ARTHROSCOPY W/ ROTATOR CUFF REPAIR Right 1999   SHOULDER OPEN ROTATOR CUFF REPAIR Left 1992   Metal implant    Social History:  reports that he quit smoking about 6 years ago. His smoking use included cigarettes. He started smoking about 51 years ago. He has a 9 pack-year smoking history. He has never used smokeless tobacco. He reports current alcohol  use. He reports that he does not use drugs.  Allergies  Allergen Reactions   Lyrica [Pregabalin] Shortness Of Breath   Celecoxib Other (See Comments)    GI bleed, fatigue   Codeine Itching    Family History  Problem Relation Age of Onset   COPD Mother    Mental illness Mother    Hypertension Mother    Varicose Veins Mother    Heart attack Mother 4   Hypertension Father    Alcohol  abuse Father    Colon cancer Neg Hx     Prior to Admission medications   Medication Sig Start Date End Date Taking? Authorizing Provider  amLODipine   (NORVASC ) 10 MG tablet TAKE 1 TABLET BY MOUTH EVERY DAY 05/10/23   Gerard Knight, MD  aspirin  EC 81 MG tablet Take 81 mg by mouth at bedtime.     [provider]  azelastine  (ASTELIN ) 0.1 % nasal spray Place 1 spray into both nostrils 2 (two) times daily. Use in each nostril as directed 04/01/24   Corbin Dess, PA-C  isosorbide  mononitrate (IMDUR ) 60 MG 24 hr tablet TAKE 1 TABLET BY MOUTH DAILY 01/25/24   Gerard Knight, MD  levothyroxine  (SYNTHROID ) 175 MCG tablet Take 175 mcg by mouth daily. 01/14/21   [provider]  losartan  (COZAAR ) 50 MG tablet TAKE 1 TABLET BY MOUTH EVERY DAY 11/05/23   Gerard Knight, MD  metoprolol  succinate (TOPROL -XL) 25 MG 24 hr tablet TAKE 1 TABLET BY MOUTH EVERY DAY 11/05/23   Gerard Knight, MD  nitroGLYCERIN  (NITROSTAT ) 0.4 MG SL tablet Place 1 tablet (0.4 mg total) under the tongue every 5 (five) minutes x 3 doses as needed  for chest pain (if no relief after 3rd dose, proceed to ED or call 911). 04/02/24   Gerard Knight, MD  pantoprazole  (PROTONIX ) 40 MG tablet Take 40 mg by mouth daily.    [provider]  rosuvastatin  (CRESTOR ) 20 MG tablet TAKE 1 TABLET BY MOUTH AT BEDTIME 11/05/23   Gerard Knight, MD  sulfamethoxazole -trimethoprim  (BACTRIM  DS) 800-160 MG tablet Take 1 tablet by mouth 2 (two) times daily for 7 days. 04/03/24 04/10/24  Eloise Hake Scales, PA-C    Physical Exam: Vitals:   04/05/24 1415 04/05/24 1430 04/05/24 1755 04/05/24 1803  BP: 134/67 (!) 149/66 (!) 154/59 (!) 158/69  Pulse: (!) 55 (!) 56 (!) 59 (!) 57  Resp: 16 17 20 16   Temp:   98 F (36.7 C) 98.6 F (37 C)  TempSrc:   Oral Oral  SpO2: 96% 97% 98% 98%  Weight:      Height:       General: Older male. Awake and alert and oriented x3. No acute cardiopulmonary distress.  HEENT: Normocephalic atraumatic.  Right and left ears normal in appearance.  Pupils equal, round, reactive to light. Extraocular muscles are intact. Sclerae  anicteric and noninjected.  Moist mucosal membranes. No mucosal lesions.  Neck: Neck supple without lymphadenopathy. No carotid bruits. No masses palpated.  Cardiovascular: Regular rate with normal S1-S2 sounds. No murmurs, rubs, gallops auscultated. No JVD.  Respiratory: Good respiratory effort with no wheezes, rales, rhonchi. Lungs clear to auscultation bilaterally.  No accessory muscle use. Abdomen: Soft, nontender, nondistended. Active bowel sounds. No masses or hepatosplenomegaly  Skin: No rashes, lesions, or ulcerations.  Dry, warm to touch. 2+ dorsalis pedis and radial pulses. Musculoskeletal: No calf or leg pain. All major joints not erythematous nontender.  No upper or lower joint deformation.  Good ROM.  No contractures  Psychiatric: Intact judgment and insight. Pleasant and cooperative. Neurologic: No focal neurological deficits. Strength is 5/5 and symmetric in upper and lower extremities.  Cranial nerves II through XII are grossly intact.  Data Reviewed: Results for orders placed or performed during the hospital encounter of 04/05/24 (from the past 24 hours)  Ethanol     Status: None   Collection Time: 04/05/24  2:08 PM  Result Value Ref Range   Alcohol , Ethyl (B) <15 <15 mg/dL  CBC     Status: None   Collection Time: 04/05/24  2:08 PM  Result Value Ref Range   WBC 4.9 4.0 - 10.5 K/uL   RBC 5.09 4.22 - 5.81 MIL/uL   Hemoglobin 15.5 13.0 - 17.0 g/dL   HCT 16.1 09.6 - 04.5 %   MCV 88.6 80.0 - 100.0 fL   MCH 30.5 26.0 - 34.0 pg   MCHC 34.4 30.0 - 36.0 g/dL   RDW 40.9 81.1 - 91.4 %   Platelets 183 150 - 400 K/uL   nRBC 0.0 0.0 - 0.2 %  Differential     Status: None   Collection Time: 04/05/24  2:08 PM  Result Value Ref Range   Neutrophils Relative % 75 %   Neutro Abs 3.7 1.7 - 7.7 K/uL   Band Neutrophils 1 %   Lymphocytes Relative 18 %   Lymphs Abs 0.9 0.7 - 4.0 K/uL   Monocytes Relative 4 %   Monocytes Absolute 0.2 0.1 - 1.0 K/uL   Eosinophils Relative 2 %    Eosinophils Absolute 0.1 0.0 - 0.5 K/uL   Basophils Relative 0 %   Basophils Absolute 0.0 0.0 - 0.1  K/uL   WBC Morphology MORPHOLOGY UNREMARKABLE    RBC Morphology MORPHOLOGY UNREMARKABLE    Smear Review MORPHOLOGY UNREMARKABLE    Abs Immature Granulocytes 0.00 0.00 - 0.07 K/uL  Comprehensive metabolic panel     Status: Abnormal   Collection Time: 04/05/24  2:08 PM  Result Value Ref Range   Sodium 133 (L) 135 - 145 mmol/L   Potassium 4.6 3.5 - 5.1 mmol/L   Chloride 104 98 - 111 mmol/L   CO2 24 22 - 32 mmol/L   Glucose, Bld 117 (H) 70 - 99 mg/dL   BUN 19 8 - 23 mg/dL   Creatinine, Ser 1.61 (H) 0.61 - 1.24 mg/dL   Calcium  9.1 8.9 - 10.3 mg/dL   Total Protein 7.9 6.5 - 8.1 g/dL   Albumin 4.2 3.5 - 5.0 g/dL   AST 17 15 - 41 U/L   ALT 21 0 - 44 U/L   Alkaline Phosphatase 97 38 - 126 U/L   Total Bilirubin 0.6 0.0 - 1.2 mg/dL   GFR, Estimated >09 >60 mL/min   Anion gap 5 5 - 15  I-stat chem 8, ED     Status: Abnormal   Collection Time: 04/05/24  2:10 PM  Result Value Ref Range   Sodium 139 135 - 145 mmol/L   Potassium 4.8 3.5 - 5.1 mmol/L   Chloride 104 98 - 111 mmol/L   BUN 21 8 - 23 mg/dL   Creatinine, Ser 4.54 (H) 0.61 - 1.24 mg/dL   Glucose, Bld 098 (H) 70 - 99 mg/dL   Calcium , Ion 1.21 1.15 - 1.40 mmol/L   TCO2 24 22 - 32 mmol/L   Hemoglobin 15.6 13.0 - 17.0 g/dL   HCT 11.9 14.7 - 82.9 %  Urine rapid drug screen (hosp performed)     Status: None   Collection Time: 04/05/24  3:40 PM  Result Value Ref Range   Opiates NONE DETECTED NONE DETECTED   Cocaine NONE DETECTED NONE DETECTED   Benzodiazepines NONE DETECTED NONE DETECTED   Amphetamines NONE DETECTED NONE DETECTED   Tetrahydrocannabinol NONE DETECTED NONE DETECTED   Barbiturates NONE DETECTED NONE DETECTED    CT ANGIO HEAD NECK W WO CM Result Date: 04/05/2024 CLINICAL DATA:  Provided history: Neuro deficit, acute, stroke suspected. Vision loss. EXAM: CT ANGIOGRAPHY HEAD AND NECK WITH AND WITHOUT CONTRAST TECHNIQUE:  Multidetector CT imaging of the head and neck was performed using the standard protocol during bolus administration of intravenous contrast. Multiplanar CT image reconstructions and MIPs were obtained to evaluate the vascular anatomy. Carotid stenosis measurements (when applicable) are obtained utilizing NASCET criteria, using the distal internal carotid diameter as the denominator. RADIATION DOSE REDUCTION: This exam was performed according to the departmental dose-optimization program which includes automated exposure control, adjustment of the mA and/or kV according to patient size and/or use of iterative reconstruction technique. CONTRAST:  75mL OMNIPAQUE  IOHEXOL  350 MG/ML SOLN COMPARISON:  Brain MRI 04/05/2024.  CTA neck 06/07/2016. FINDINGS: CT HEAD FINDINGS Brain: Mild generalized cerebral atrophy. Known small acute/subacute cortical infarcts within the right occipital lobe (PCA vascular territory), some of which were better appreciated on the brain MRI performed earlier today. Patchy and ill-defined hypoattenuation within the cerebral white matter, nonspecific but compatible with mild chronic small vessel ischemic disease. There is no acute intracranial hemorrhage. No extra-axial fluid collection. No evidence of an intracranial mass. No midline shift. Vascular: No hyperdense vessel.  Atherosclerotic calcifications. Skull: No calvarial fracture or aggressive osseous lesion. Sinuses/Orbits: Bilateral proptosis. 17 mm  mucous retention cyst or polyp within the left maxillary sinus. Review of the MIP images confirms the above findings CTA NECK FINDINGS Aortic arch: The left vertebral artery arises directly from the aortic arch. Atherosclerotic plaque within the visualized aortic arch and proximal major branch vessels of the neck. No hemodynamically significant innominate or proximal subclavian artery stenosis. Right carotid system: Right carotid endarterectomy has been performed since the prior CTA of 06/07/2016.  CCA and ICA patent within the neck without stenosis. Mild atherosclerotic plaque scattered within the CCA, but the carotid bifurcation and within the distal cervical ICA. Left carotid system: CCA and ICA patent within the neck without hemodynamically significant stenosis (50% or greater). Atherosclerotic plaque scattered within the CCA and within the proximal ICA, similar to the prior examination of 06/07/2016. Vertebral arteries: Venous reflux of contrast obscures portions of the right vertebral artery V1 segment. Within this limitation, the right vertebral artery is patent within the neck. Progressive atherosclerotic plaque within the right vertebral artery at the V3/V4 junction resulting in moderate/severe stenosis. Additionally, the right vertebral artery is diffusely decreased in caliber as compared to the prior examination of 06/07/2016. This could potentially be secondary to an obscured high-grade proximal right vertebral artery stenosis. The left vertebral artery is patent within the neck without stenosis. Nonstenotic atherosclerotic plaque at the origin of this vessel Skeleton: Partially imaged thoracic scoliosis. Cervical spondylosis. Mild grade 1 anterolisthesis at C3-C4 and C4-C5. No acute fracture or aggressive osseous lesion. Other neck: No neck mass or cervical lymphadenopathy. Thyroid  gland is poorly delineated, presumed surgically absent. Upper chest: No consolidation within the imaged lung apices. Review of the MIP images confirms the above findings CTA HEAD FINDINGS Anterior circulation: The intracranial internal carotid arteries are patent. Calcified plaque within both vessels. Most notably, calcified plaque within the distal cavernous/paraclinoid right ICA results in up to moderate stenosis. The M1 middle cerebral arteries are patent. No M2 proximal branch occlusion or high-grade proximal stenosis. The anterior cerebral arteries are patent. No intracranial aneurysm is identified. Posterior  circulation: The intracranial vertebral arteries are patent. Progressive atherosclerotic plaque within the right vertebral artery at the V3/V4 junction resulting in moderate/severe stenosis. The basilar artery is patent. The posterior cerebral arteries are patent. Bilateral PCA atherosclerotic irregularity. Most notably, there are severe stenoses within bilateral PCA branches at the P3 segment level. Small posterior communicating arteries are present bilaterally. Venous sinuses: Within the limitations of contrast timing, no convincing thrombus. Anatomic variants: None significant. Review of the MIP images confirms the above findings IMPRESSION: Non-contrast head CT: 1. Small acute/subacute cortical infarcts within the right occipital lobe (PCA vascular territory), some of which were better appreciated on the brain MRI performed earlier today. 2. Mild cerebral white matter chronic small vessel ischemic disease. 3. Mild generalized cerebral atrophy. 4. 17 mm mucous retention cyst or polyp within the left maxillary sinus. CTA neck: 1. Right carotid endarterectomy has been performed since the prior CTA neck of 06/07/2016. 2. The common carotid and internal carotid arteries are patent within the neck without hemodynamically significant stenosis (50% or greater). Atherosclerotic plaque bilaterally, as described. 3. Venous reflux of contrast partially obscures the right vertebral artery V1 segment. Within this limitation, the vertebral arteries are patent within the neck. Progressive atherosclerotic plaque within the right vertebral artery at the V3/V4 junction resulting and moderate/severe stenosis. Additionally, the right vertebral artery is diffusely decreased in caliber as compared to the prior examination of 06/07/2016. This could potentially be secondary to an obscured high-grade proximal right  vertebral artery stenosis. 4. Aortic Atherosclerosis (ICD10-I70.0). CTA head: 1. No proximal intracranial large vessel  occlusion identified. 2. Intracranial atherosclerotic disease with multifocal stenoses, most notably as follows. 3. Progressive atherosclerotic plaque within the right vertebral artery at the V3/V4 junction resulting in moderate/severe stenosis. 4. Severe stenoses within bilateral posterior cerebral artery branches at the P3 segment level. 5. Up to moderate stenosis within the cavernous/paraclinoid right internal carotid artery. Electronically Signed   By: Bascom Lily D.O.   On: 04/05/2024 15:28   MR BRAIN WO CONTRAST Addendum Date: 04/05/2024 ADDENDUM REPORT: 04/05/2024 13:49 ADDENDUM: Correction: There is a dictation error within MRI brain impression #1, which should read small patchy acute/subacute cortical infarcts within the right occipital lobe (PCA vascular territory). These results were called by telephone at the time of interpretation on 04/05/2024 at 1:45 pm to provider [facility] , who verbally acknowledged these results. Electronically Signed   By: Bascom Lily D.O.   On: 04/05/2024 13:49   Result Date: 04/05/2024 CLINICAL DATA:  Provided history: Sudden visual loss of left eye. Unspecified exophthalmos. Additional history provided: Left eye bulging following recent trauma. EXAM: MRI HEAD AND ORBITS WITHOUT CONTRAST TECHNIQUE: Multiplanar, multi-echo pulse sequences of the brain and surrounding structures were acquired without intravenous contrast. Multiplanar, multi-echo pulse sequences of the orbits and surrounding structures were acquired including fat saturation techniques, without intravenous contrast administration. COMPARISON:  Brain MRI 09/01/2013. FINDINGS: MRI HEAD FINDINGS Brain: No age-advanced or lobar predominant cerebral atrophy. Patchy small acute/subacute cortical infarcts within the right occipital lobe (PCA vascular territory). Multifocal T2 FLAIR hyperintense signal abnormality within the cerebral white matter, nonspecific but compatible with mild chronic small vessel ischemic  disease. There is no acute infarct. No evidence of an intracranial mass. No chronic intracranial blood products. No extra-axial fluid collection. No midline shift. Vascular: Maintained flow voids within the proximal large arterial vessels. 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 Skull and upper cervical spine: No focal worrisome marrow lesion. Incompletely assessed cervical spondylosis. Other: Small-volume fluid within bilateral mastoid air cells. 7 mm Tornwaldt cyst. MRI ORBITS FINDINGS Orbits: Bilateral proptosis. The globes are normal in size and contour. The extraocular muscles, optic nerve sheath complexes and lacrimal glands are symmetric and unremarkable on this non-contrast examination. No evidence of an orbital mass. Visualized sinuses: 1.7 cm mucous retention cyst within the left maxillary sinus. Soft tissues: Unremarkable appearance of the visible maxillofacial soft tissues. Attempts are being made to reach the ordering provider at this time. IMPRESSION: MRI brain: 1. Small patchy acute cortical infarcts within the right occipital lobe (PCA vascular territory). 2. Mild chronic small vessel ischemic changes within the cerebral white matter, slightly progressed since the brain MRI of 09/01/2013. 3. 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 4. Small-volume fluid within bilateral mastoid air cells. MRI orbits: 1. Bilateral proptosis. 2. Otherwise unremarkable non-contrast MRI appearance of the orbits. 3. 17 mm left maxillary sinus mucous retention cyst. Electronically Signed: By: Bascom Lily D.O. On: 04/05/2024 13:41   MR ORBITS WO CONTRAST Addendum Date: 04/05/2024 ADDENDUM REPORT: 04/05/2024 13:49 ADDENDUM: Correction: There is a dictation error within MRI brain impression #1, which should read small patchy acute/subacute cortical infarcts within the right occipital  lobe (PCA vascular territory). These results were called by telephone at the time of interpretation on 04/05/2024 at 1:45 pm to provider [facility] , who verbally acknowledged these results. Electronically Signed   By:  Bascom Lily D.O.   On: 04/05/2024 13:49   Result Date: 04/05/2024 CLINICAL DATA:  Provided history: Sudden visual loss of left eye. Unspecified exophthalmos. Additional history provided: Left eye bulging following recent trauma. EXAM: MRI HEAD AND ORBITS WITHOUT CONTRAST TECHNIQUE: Multiplanar, multi-echo pulse sequences of the brain and surrounding structures were acquired without intravenous contrast. Multiplanar, multi-echo pulse sequences of the orbits and surrounding structures were acquired including fat saturation techniques, without intravenous contrast administration. COMPARISON:  Brain MRI 09/01/2013. FINDINGS: MRI HEAD FINDINGS Brain: No age-advanced or lobar predominant cerebral atrophy. Patchy small acute/subacute cortical infarcts within the right occipital lobe (PCA vascular territory). Multifocal T2 FLAIR hyperintense signal abnormality within the cerebral white matter, nonspecific but compatible with mild chronic small vessel ischemic disease. There is no acute infarct. No evidence of an intracranial mass. No chronic intracranial blood products. No extra-axial fluid collection. No midline shift. Vascular: Maintained flow voids within the proximal large arterial vessels. 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 Skull and upper cervical spine: No focal worrisome marrow lesion. Incompletely assessed cervical spondylosis. Other: Small-volume fluid within bilateral mastoid air cells. 7 mm Tornwaldt cyst. MRI ORBITS FINDINGS Orbits: Bilateral proptosis. The globes are normal in size and contour. The extraocular muscles, optic nerve sheath complexes and lacrimal glands are symmetric and unremarkable on this non-contrast  examination. No evidence of an orbital mass. Visualized sinuses: 1.7 cm mucous retention cyst within the left maxillary sinus. Soft tissues: Unremarkable appearance of the visible maxillofacial soft tissues. Attempts are being made to reach the ordering provider at this time. IMPRESSION: MRI brain: 1. Small patchy acute cortical infarcts within the right occipital lobe (PCA vascular territory). 2. Mild chronic small vessel ischemic changes within the cerebral white matter, slightly progressed since the brain MRI of 09/01/2013. 3. 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 4. Small-volume fluid within bilateral mastoid air cells. MRI orbits: 1. Bilateral proptosis. 2. Otherwise unremarkable non-contrast MRI appearance of the orbits. 3. 17 mm left maxillary sinus mucous retention cyst. Electronically Signed: By: Bascom Lily D.O. On: 04/05/2024 13:41     Assessment and Plan: No notes have been filed under this hospital service. Service: Hospitalist  Principal Problem:   Acute stroke due to ischemia [facility]) Active Problems:   Essential hypertension   Coronary artery disease involving native coronary artery of native heart without angina pectoris   Asymptomatic carotid artery stenosis   Hypothyroid   COPD (chronic obstructive pulmonary disease) (HCC)   Carotid artery disease (HCC)  Occipital lobe stroke Observation on telemetry Echocardiogram tomorrow Hemoglobin A1c, lipid panel in the morning PT/OT/speech therapy consult aspirin  and Plavix (failed the aspirin  alone) Carotid artery disease status post bilateral endarterectomy Continue statin He does have some high vessel diseasE Coronary artery disease Hypothyroidism Continue levothyroxine  COPD Hypertension Continue blood pressure control as past the acute phase.   Advance Care Planning:   Code Status: Full Code   Consults: Neurology  Family Communication: Wife  present  Severity of Illness: The appropriate patient status for this patient is INPATIENT. Inpatient status is judged to be reasonable and necessary in order to provide the required intensity of service to ensure the patient's safety. The patient's presenting symptoms, physical exam findings, and initial radiographic and laboratory data in the context of their chronic comorbidities is felt to place them at high risk for further clinical deterioration. Furthermore, it is not anticipated that the patient will be  medically stable for discharge from the hospital within 2 midnights of admission.   * I certify that at the point of admission it is my clinical judgment that the patient will require inpatient hospital care spanning beyond 2 midnights from the point of admission due to high intensity of service, high risk for further deterioration and high frequency of surveillance required.*  Author: Josemanuel Eakins J Akiem Urieta, DO 04/05/2024 6:48 PM  For on call review [email or web address].

## 2024-04-06 ENCOUNTER — Observation Stay (HOSPITAL_COMMUNITY)

## 2024-04-06 DIAGNOSIS — I639 Cerebral infarction, unspecified: Secondary | ICD-10-CM

## 2024-04-06 DIAGNOSIS — I6389 Other cerebral infarction: Secondary | ICD-10-CM | POA: Diagnosis not present

## 2024-04-06 LAB — LIPID PANEL
Cholesterol: 99 mg/dL (ref 0–200)
HDL: 29 mg/dL — ABNORMAL LOW (ref >40)
LDL Cholesterol: 38 mg/dL (ref 0–99)
Total CHOL/HDL Ratio: 3.4 ratio
Triglycerides: 162 mg/dL — ABNORMAL HIGH (ref <150)
VLDL: 32 mg/dL (ref 0–40)

## 2024-04-06 LAB — ECHOCARDIOGRAM COMPLETE
AR max vel: 2.66 cm2
AV Peak grad: 8.2 mmHg
Ao pk vel: 1.43 m/s
Area-P 1/2: 2.3 cm2
Height: 72 in
MV VTI: 2.36 cm2
S' Lateral: 3.2 cm
Weight: 3462.46 [oz_av]

## 2024-04-06 LAB — HIV ANTIBODY (ROUTINE TESTING W REFLEX): HIV Screen 4th Generation wRfx: NONREACTIVE

## 2024-04-06 LAB — HEMOGLOBIN A1C
Hgb A1c MFr Bld: 6.5 % — ABNORMAL HIGH (ref 4.8–5.6)
Mean Plasma Glucose: 139.85 mg/dL

## 2024-04-06 MED ORDER — DAPAGLIFLOZIN PROPANEDIOL 5 MG PO TABS: 5.0000 mg | ORAL_TABLET | Freq: Every day | ORAL | 2 refills | Status: DC

## 2024-04-06 MED ORDER — FUROSEMIDE 20 MG PO TABS: 20.0000 mg | ORAL_TABLET | Freq: Every day | ORAL | 1 refills | Status: DC | PRN

## 2024-04-06 MED ORDER — ATORVASTATIN CALCIUM 40 MG PO TABS: 40.0000 mg | ORAL_TABLET | Freq: Every day | ORAL | 11 refills | Status: DC

## 2024-04-06 MED ORDER — CLOPIDOGREL BISULFATE 75 MG PO TABS: 75.0000 mg | ORAL_TABLET | Freq: Every day | ORAL | 1 refills | Status: DC

## 2024-04-06 NOTE — Discharge Summary (Signed)
 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 Started on Farxiga 5 mg daily and patient is already on losartan  and beta-blocker Given Lasix 20 mg as needed for any swelling or shortness of breath or weight gain Consider initiation of spironolactone based on blood pressure readings on cardiology visit Referral to neurology in 8 weeks for stroke follow-up Rosuvastatin  dose increased to 40 mg daily and patient to remain on DAPT as prescribed with addition of Plavix indefinitely per neurology Continue other home medications as prior  Home Health: None  Equipment/Devices: None  Discharge Condition:Stable  CODE STATUS: Full  Diet recommendation: Heart Healthy  Brief/Interim Summary:  Seth Savage is a 73 y.o. male with medical history significant of COPD, carotid artery disease status post endarterectomy bilaterally, hypertension, hypothyroidism, hyperlipidemia.  Patient presents with left sided visual field defect that started 3 days ago and has gradually improved since the onset.  He was noted to have acute occipital lobe CVA noted on MRI in the setting of carotid artery disease as well as coronary artery disease.  He has no further visual deficits and states that this appears to have resolved almost completely.  PT/OT recommending no further follow-up at this time and neurology recommends increased dose of atorvastatin  to 40 mg daily and use of aspirin  and Plavix indefinitely.  2D echocardiogram does reveal reduced LVEF of 40-45% along with new wall motion abnormalities.  Cardiology has scheduled outpatient visit to further  evaluate and patient has been started on GDMT.  He is asymptomatic from the standpoint.  No other acute events or concerns noted and he is stable for discharge.  Discharge Diagnoses:  Principal Problem:   Acute stroke due to ischemia [facility]) Active Problems:   Essential hypertension   Coronary artery disease involving native coronary artery of native heart without angina pectoris   Asymptomatic carotid artery stenosis   Hypothyroid   COPD (chronic obstructive pulmonary disease) (HCC)   Carotid artery disease (HCC)  Principal discharge diagnosis: Ischemic occipital lobe CVA.  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  Discharge Instructions  Discharge Instructions     (HEART FAILURE PATIENTS) Call MD:  Anytime you have any of the following symptoms: 1) 3 pound weight gain in 24 hours or 5 pounds in 1 week 2) shortness of breath, with or without a dry hacking cough 3) swelling in the hands, feet or stomach 4) if you have to sleep on extra pillows at night in order to breathe.   Complete by: As directed    Ambulatory referral to Neurology   Complete by: As directed    An appointment is requested in approximately: 8 weeks   Diet - low sodium heart healthy   Complete by: As directed    Increase activity slowly   Complete by: As directed       Allergies as of 04/06/2024       Reactions   Lyrica [pregabalin] Shortness Of Breath   Celecoxib Other (See Comments)   GI bleed, fatigue   Codeine Itching        Medication List     STOP taking these medications    rosuvastatin  20  MG tablet Commonly known as: CRESTOR        TAKE these medications    amLODipine  10 MG tablet Commonly known as: NORVASC  TAKE 1 TABLET BY MOUTH EVERY DAY   aspirin  EC 81 MG tablet Take 81 mg by mouth at bedtime.   atorvastatin  40 MG tablet Commonly known as: Lipitor Take 1 tablet (40 mg total) by mouth daily.   azelastine  0.1 % nasal spray Commonly known as: ASTELIN  Place 1 spray  into both nostrils 2 (two) times daily. Use in each nostril as directed   clopidogrel 75 MG tablet Commonly known as: PLAVIX Take 1 tablet (75 mg total) by mouth daily. Start taking on: Apr 07, 2024   dapagliflozin propanediol 5 MG Tabs tablet Commonly known as: Farxiga Take 1 tablet (5 mg total) by mouth daily before breakfast.   furosemide 20 MG tablet Commonly known as: Lasix Take 1 tablet (20 mg total) by mouth daily as needed for fluid or edema (shortness of breath).   guaiFENesin  600 MG 12 hr tablet Commonly known as: MUCINEX  Take 600 mg by mouth 2 (two) times daily as needed for cough or to loosen phlegm.   isosorbide  mononitrate 60 MG 24 hr tablet Commonly known as: IMDUR  TAKE 1 TABLET BY MOUTH DAILY   levothyroxine  175 MCG tablet Commonly known as: SYNTHROID  Take 175 mcg by mouth daily.   losartan  50 MG tablet Commonly known as: COZAAR  TAKE 1 TABLET BY MOUTH EVERY DAY   metoprolol  succinate 25 MG 24 hr tablet Commonly known as: TOPROL -XL TAKE 1 TABLET BY MOUTH EVERY DAY   nitroGLYCERIN  0.4 MG SL tablet Commonly known as: NITROSTAT  Place 1 tablet (0.4 mg total) under the tongue every 5 (five) minutes x 3 doses as needed for chest pain (if no relief after 3rd dose, proceed to ED or call 911).   pantoprazole  40 MG tablet Commonly known as: PROTONIX  Take 40 mg by mouth daily.   sulfamethoxazole -trimethoprim  800-160 MG tablet Commonly known as: BACTRIM  DS Take 1 tablet by mouth 2 (two) times daily for 7 days.        Follow-up Information     Dorma Gash, PA-C Follow up.   Specialties: Cardiology, Cardiology Why: Hospital follow-up with Cardiology at our [facility] office scheduled for 05/01/2024 at 2:30pm. Please arrive 15 minutes early for check-in. If this date/ time does not work for you, please call our office to reschedule. Contact information: [address] [place] [address] [phone]         Orlena Bitters, MD. Schedule an appointment as  soon as possible for a visit in 1 week(s).   Specialty: Internal Medicine Contact information: [address] [place] [address] [phone]         [facility]. Go to.   Contact information: [address]     [address] [place]  [address] [phone]               Allergies  Allergen Reactions   Lyrica [Pregabalin] Shortness Of Breath   Celecoxib Other (See Comments)    GI bleed, fatigue   Codeine Itching    Consultations: Discussed with neurology Dr. Renaee Caro   Procedures/Studies: ECHOCARDIOGRAM COMPLETE Result Date: 04/06/2024    ECHOCARDIOGRAM REPORT   Patient Name:   Seth Savage Date of Exam: 04/06/2024 Medical Rec #:  [id number]       Height:       72.0 in Accession #:    [id number]  Weight:       216.4 lb Date of Birth:  1951/01/18       BSA:          2.203 m Patient Age:    72 years        BP:           110/60 mmHg Patient Gender: M               HR:           63 bpm. Exam Location:  [facility] Procedure: 2D Echo, Cardiac Doppler and Color Doppler (Both Spectral and Color            Flow Doppler were utilized during procedure). Indications:    Stroke  History:        Patient has prior history of Echocardiogram examinations. CAD,                 COPD; Risk Factors:Former Smoker and Hypertension.  Sonographer:    Willey Harrier Referring Phys: [phone] JACOB J STINSON  Sonographer Comments: Technically difficult study due to poor echo windows. IMPRESSIONS  1. Limited image quality due to poor sound wave transmission. On the 3-chamber images there appears to be a basilar to min inferoalteral wall motion abnormality. Consider repeat echo with Definity contrast to furher evalaute. Left ventricular ejection fraction, by estimation, is 40 to 45%. The left ventricle has mildly decreased function. The left ventricle demonstrates regional wall motion abnormalities (see scoring diagram/findings for description). Left ventricular diastolic  parameters are consistent with Grade I diastolic dysfunction (impaired relaxation).  2. Right ventricular systolic function is normal. The right ventricular size is normal.  3. The mitral valve is normal in structure. Trivial mitral valve regurgitation. No evidence of mitral stenosis.  4. The aortic valve is normal in structure. Aortic valve regurgitation is not visualized. No aortic stenosis is present.  5. The inferior vena cava is normal in size with greater than 50% respiratory variability, suggesting right atrial pressure of 3 mmHg. FINDINGS  Left Ventricle: Limited image quality due to poor sound wave transmission. On the 3-chamber images there appears to be a basilar to min inferoalteral wall motion abnormality. Consider repeat echo with Definity contrast to furher evalaute. Left ventricular ejection fraction, by estimation, is 40 to 45%. The left ventricle has mildly decreased function. The left ventricle demonstrates regional wall motion abnormalities. The left ventricular internal cavity size was normal in size. There is no left ventricular hypertrophy. Left ventricular diastolic parameters are consistent with Grade I diastolic dysfunction (impaired relaxation).  LV Wall Scoring: The inferior wall and posterior wall are hypokinetic. Right Ventricle: The right ventricular size is normal. No increase in right ventricular wall thickness. Right ventricular systolic function is normal. Left Atrium: Left atrial size was normal in size. Right Atrium: Right atrial size was normal in size. Pericardium: There is no evidence of pericardial effusion. Mitral Valve: The mitral valve is normal in structure. Trivial mitral valve regurgitation. No evidence of mitral valve stenosis. MV peak gradient, 3.7 mmHg. The mean mitral valve gradient is 1.0 mmHg. Tricuspid Valve: The tricuspid valve is normal in structure. Tricuspid valve regurgitation is trivial. No evidence of tricuspid stenosis. Aortic Valve: The aortic valve is  normal in structure. Aortic valve regurgitation is not visualized. No aortic stenosis is present. Aortic valve peak gradient measures 8.2 mmHg. Pulmonic Valve: The pulmonic valve was normal in structure. Pulmonic valve regurgitation is not visualized. No evidence of pulmonic stenosis. Aorta: The aortic  root is normal in size and structure. Venous: The inferior vena cava is normal in size with greater than 50% respiratory variability, suggesting right atrial pressure of 3 mmHg. IAS/Shunts: No atrial level shunt detected by color flow Doppler.  LEFT VENTRICLE PLAX 2D LVIDd:         4.90 cm   Diastology LVIDs:         3.20 cm   LV e' medial:    7.14 cm/s LV PW:         0.90 cm   LV E/e' medial:  10.3 LV IVS:        0.90 cm   LV e' lateral:   8.45 cm/s LVOT diam:     2.10 cm   LV E/e' lateral: 8.7 LV SV:         78 LV SV Index:   36 LVOT Area:     3.46 cm  RIGHT VENTRICLE             IVC RV Basal diam:  3.90 cm     IVC diam: 1.70 cm RV S prime:     10.20 cm/s TAPSE (M-mode): 1.8 cm LEFT ATRIUM             Index        RIGHT ATRIUM           Index LA Vol (A2C):   50.0 ml 22.69 ml/m  RA Area:     13.50 cm LA Vol (A4C):   33.1 ml 15.02 ml/m  RA Volume:   35.90 ml  16.29 ml/m LA Biplane Vol: 42.1 ml 19.11 ml/m  AORTIC VALVE AV Area (Vmax): 2.66 cm AV Vmax:        143.00 cm/s AV Peak Grad:   8.2 mmHg LVOT Vmax:      110.00 cm/s LVOT Vmean:     73.700 cm/s LVOT VTI:       0.226 m  AORTA Ao Root diam: 3.40 cm Ao Asc diam:  3.40 cm MITRAL VALVE MV Area (PHT): 2.30 cm    SHUNTS MV Area VTI:   2.36 cm    Systemic VTI:  0.23 m MV Peak grad:  3.7 mmHg    Systemic Diam: 2.10 cm MV Mean grad:  1.0 mmHg MV Vmax:       0.96 m/s MV Vmean:      54.8 cm/s MV Decel Time: 330 msec MV E velocity: 73.30 cm/s MV A velocity: 87.00 cm/s MV E/A ratio:  0.84 Jules Oar MD Electronically signed by Jules Oar MD Signature Date/Time: 04/06/2024/3:36:36 PM    Final    CT ANGIO HEAD NECK W WO CM Result Date: 04/05/2024 CLINICAL  DATA:  Provided history: Neuro deficit, acute, stroke suspected. Vision loss. EXAM: CT ANGIOGRAPHY HEAD AND NECK WITH AND WITHOUT CONTRAST TECHNIQUE: Multidetector CT imaging of the head and neck was performed using the standard protocol during bolus administration of intravenous contrast. Multiplanar CT image reconstructions and MIPs were obtained to evaluate the vascular anatomy. Carotid stenosis measurements (when applicable) are obtained utilizing NASCET criteria, using the distal internal carotid diameter as the denominator. RADIATION DOSE REDUCTION: This exam was performed according to the departmental dose-optimization program which includes automated exposure control, adjustment of the mA and/or kV according to patient size and/or use of iterative reconstruction technique. CONTRAST:  75mL OMNIPAQUE  IOHEXOL  350 MG/ML SOLN COMPARISON:  Brain MRI 04/05/2024.  CTA neck 06/07/2016. FINDINGS: CT HEAD FINDINGS Brain: Mild generalized cerebral atrophy. Known small acute/subacute cortical infarcts  within the right occipital lobe (PCA vascular territory), some of which were better appreciated on the brain MRI performed earlier today. Patchy and ill-defined hypoattenuation within the cerebral white matter, nonspecific but compatible with mild chronic small vessel ischemic disease. There is no acute intracranial hemorrhage. No extra-axial fluid collection. No evidence of an intracranial mass. No midline shift. Vascular: No hyperdense vessel.  Atherosclerotic calcifications. Skull: No calvarial fracture or aggressive osseous lesion. Sinuses/Orbits: Bilateral proptosis. 17 mm mucous retention cyst or polyp within the left maxillary sinus. Review of the MIP images confirms the above findings CTA NECK FINDINGS Aortic arch: The left vertebral artery arises directly from the aortic arch. Atherosclerotic plaque within the visualized aortic arch and proximal major branch vessels of the neck. No hemodynamically significant  innominate or proximal subclavian artery stenosis. Right carotid system: Right carotid endarterectomy has been performed since the prior CTA of 06/07/2016. CCA and ICA patent within the neck without stenosis. Mild atherosclerotic plaque scattered within the CCA, but the carotid bifurcation and within the distal cervical ICA. Left carotid system: CCA and ICA patent within the neck without hemodynamically significant stenosis (50% or greater). Atherosclerotic plaque scattered within the CCA and within the proximal ICA, similar to the prior examination of 06/07/2016. Vertebral arteries: Venous reflux of contrast obscures portions of the right vertebral artery V1 segment. Within this limitation, the right vertebral artery is patent within the neck. Progressive atherosclerotic plaque within the right vertebral artery at the V3/V4 junction resulting in moderate/severe stenosis. Additionally, the right vertebral artery is diffusely decreased in caliber as compared to the prior examination of 06/07/2016. This could potentially be secondary to an obscured high-grade proximal right vertebral artery stenosis. The left vertebral artery is patent within the neck without stenosis. Nonstenotic atherosclerotic plaque at the origin of this vessel Skeleton: Partially imaged thoracic scoliosis. Cervical spondylosis. Mild grade 1 anterolisthesis at C3-C4 and C4-C5. No acute fracture or aggressive osseous lesion. Other neck: No neck mass or cervical lymphadenopathy. Thyroid  gland is poorly delineated, presumed surgically absent. Upper chest: No consolidation within the imaged lung apices. Review of the MIP images confirms the above findings CTA HEAD FINDINGS Anterior circulation: The intracranial internal carotid arteries are patent. Calcified plaque within both vessels. Most notably, calcified plaque within the distal cavernous/paraclinoid right ICA results in up to moderate stenosis. The M1 middle cerebral arteries are patent. No M2  proximal branch occlusion or high-grade proximal stenosis. The anterior cerebral arteries are patent. No intracranial aneurysm is identified. Posterior circulation: The intracranial vertebral arteries are patent. Progressive atherosclerotic plaque within the right vertebral artery at the V3/V4 junction resulting in moderate/severe stenosis. The basilar artery is patent. The posterior cerebral arteries are patent. Bilateral PCA atherosclerotic irregularity. Most notably, there are severe stenoses within bilateral PCA branches at the P3 segment level. Small posterior communicating arteries are present bilaterally. Venous sinuses: Within the limitations of contrast timing, no convincing thrombus. Anatomic variants: None significant. Review of the MIP images confirms the above findings IMPRESSION: Non-contrast head CT: 1. Small acute/subacute cortical infarcts within the right occipital lobe (PCA vascular territory), some of which were better appreciated on the brain MRI performed earlier today. 2. Mild cerebral white matter chronic small vessel ischemic disease. 3. Mild generalized cerebral atrophy. 4. 17 mm mucous retention cyst or polyp within the left maxillary sinus. CTA neck: 1. Right carotid endarterectomy has been performed since the prior CTA neck of 06/07/2016. 2. The common carotid and internal carotid arteries are patent within the neck without hemodynamically  significant stenosis (50% or greater). Atherosclerotic plaque bilaterally, as described. 3. Venous reflux of contrast partially obscures the right vertebral artery V1 segment. Within this limitation, the vertebral arteries are patent within the neck. Progressive atherosclerotic plaque within the right vertebral artery at the V3/V4 junction resulting and moderate/severe stenosis. Additionally, the right vertebral artery is diffusely decreased in caliber as compared to the prior examination of 06/07/2016. This could potentially be secondary to an  obscured high-grade proximal right vertebral artery stenosis. 4. Aortic Atherosclerosis (ICD10-I70.0). CTA head: 1. No proximal intracranial large vessel occlusion identified. 2. Intracranial atherosclerotic disease with multifocal stenoses, most notably as follows. 3. Progressive atherosclerotic plaque within the right vertebral artery at the V3/V4 junction resulting in moderate/severe stenosis. 4. Severe stenoses within bilateral posterior cerebral artery branches at the P3 segment level. 5. Up to moderate stenosis within the cavernous/paraclinoid right internal carotid artery. Electronically Signed   By: Bascom Lily D.O.   On: 04/05/2024 15:28   MR BRAIN WO CONTRAST Addendum Date: 04/05/2024 ADDENDUM REPORT: 04/05/2024 13:49 ADDENDUM: Correction: There is a dictation error within MRI brain impression #1, which should read small patchy acute/subacute cortical infarcts within the right occipital lobe (PCA vascular territory). These results were called by telephone at the time of interpretation on 04/05/2024 at 1:45 pm to provider [facility] , who verbally acknowledged these results. Electronically Signed   By: Bascom Lily D.O.   On: 04/05/2024 13:49   Result Date: 04/05/2024 CLINICAL DATA:  Provided history: Sudden visual loss of left eye. Unspecified exophthalmos. Additional history provided: Left eye bulging following recent trauma. EXAM: MRI HEAD AND ORBITS WITHOUT CONTRAST TECHNIQUE: Multiplanar, multi-echo pulse sequences of the brain and surrounding structures were acquired without intravenous contrast. Multiplanar, multi-echo pulse sequences of the orbits and surrounding structures were acquired including fat saturation techniques, without intravenous contrast administration. COMPARISON:  Brain MRI 09/01/2013. FINDINGS: MRI HEAD FINDINGS Brain: No age-advanced or lobar predominant cerebral atrophy. Patchy small acute/subacute cortical infarcts within the right occipital lobe (PCA vascular territory).  Multifocal T2 FLAIR hyperintense signal abnormality within the cerebral white matter, nonspecific but compatible with mild chronic small vessel ischemic disease. There is no acute infarct. No evidence of an intracranial mass. No chronic intracranial blood products. No extra-axial fluid collection. No midline shift. Vascular: Maintained flow voids within the proximal large arterial vessels. 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 Skull and upper cervical spine: No focal worrisome marrow lesion. Incompletely assessed cervical spondylosis. Other: Small-volume fluid within bilateral mastoid air cells. 7 mm Tornwaldt cyst. MRI ORBITS FINDINGS Orbits: Bilateral proptosis. The globes are normal in size and contour. The extraocular muscles, optic nerve sheath complexes and lacrimal glands are symmetric and unremarkable on this non-contrast examination. No evidence of an orbital mass. Visualized sinuses: 1.7 cm mucous retention cyst within the left maxillary sinus. Soft tissues: Unremarkable appearance of the visible maxillofacial soft tissues. Attempts are being made to reach the ordering provider at this time. IMPRESSION: MRI brain: 1. Small patchy acute cortical infarcts within the right occipital lobe (PCA vascular territory). 2. Mild chronic small vessel ischemic changes within the cerebral white matter, slightly progressed since the brain MRI of 09/01/2013. 3. 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 4. Small-volume fluid within bilateral mastoid air cells. MRI orbits: 1. Bilateral proptosis. 2. Otherwise unremarkable non-contrast MRI appearance of the orbits. 3. 17 mm left maxillary sinus mucous retention cyst. Electronically Signed: By: Jenine Mix  Jenean Minus D.O. On: 04/05/2024 13:41   MR ORBITS WO CONTRAST Addendum Date: 04/05/2024 ADDENDUM REPORT: 04/05/2024 13:49 ADDENDUM: Correction:  There is a dictation error within MRI brain impression #1, which should read small patchy acute/subacute cortical infarcts within the right occipital lobe (PCA vascular territory). These results were called by telephone at the time of interpretation on 04/05/2024 at 1:45 pm to provider [facility] , who verbally acknowledged these results. Electronically Signed   By: Bascom Lily D.O.   On: 04/05/2024 13:49   Result Date: 04/05/2024 CLINICAL DATA:  Provided history: Sudden visual loss of left eye. Unspecified exophthalmos. Additional history provided: Left eye bulging following recent trauma. EXAM: MRI HEAD AND ORBITS WITHOUT CONTRAST TECHNIQUE: Multiplanar, multi-echo pulse sequences of the brain and surrounding structures were acquired without intravenous contrast. Multiplanar, multi-echo pulse sequences of the orbits and surrounding structures were acquired including fat saturation techniques, without intravenous contrast administration. COMPARISON:  Brain MRI 09/01/2013. FINDINGS: MRI HEAD FINDINGS Brain: No age-advanced or lobar predominant cerebral atrophy. Patchy small acute/subacute cortical infarcts within the right occipital lobe (PCA vascular territory). Multifocal T2 FLAIR hyperintense signal abnormality within the cerebral white matter, nonspecific but compatible with mild chronic small vessel ischemic disease. There is no acute infarct. No evidence of an intracranial mass. No chronic intracranial blood products. No extra-axial fluid collection. No midline shift. Vascular: Maintained flow voids within the proximal large arterial vessels. 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 Skull and upper cervical spine: No focal worrisome marrow lesion. Incompletely assessed cervical spondylosis. Other: Small-volume fluid within bilateral mastoid air cells. 7 mm Tornwaldt cyst. MRI ORBITS FINDINGS Orbits: Bilateral proptosis. The globes are  normal in size and contour. The extraocular muscles, optic nerve sheath complexes and lacrimal glands are symmetric and unremarkable on this non-contrast examination. No evidence of an orbital mass. Visualized sinuses: 1.7 cm mucous retention cyst within the left maxillary sinus. Soft tissues: Unremarkable appearance of the visible maxillofacial soft tissues. Attempts are being made to reach the ordering provider at this time. IMPRESSION: MRI brain: 1. Small patchy acute cortical infarcts within the right occipital lobe (PCA vascular territory). 2. Mild chronic small vessel ischemic changes within the cerebral white matter, slightly progressed since the brain MRI of 09/01/2013. 3. 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 4. Small-volume fluid within bilateral mastoid air cells. MRI orbits: 1. Bilateral proptosis. 2. Otherwise unremarkable non-contrast MRI appearance of the orbits. 3. 17 mm left maxillary sinus mucous retention cyst. Electronically Signed: By: Bascom Lily D.O. On: 04/05/2024 13:41     Discharge Exam: Vitals:   04/06/24 0207 04/06/24 0602  BP: (!) 109/51 110/60  Pulse: (!) 55 (!) 58  Resp: 17 18  Temp: 98.4 F (36.9 C) 98.2 F (36.8 C)  SpO2: 94% 94%   Vitals:   04/05/24 1803 04/05/24 2233 04/06/24 0207 04/06/24 0602  BP: (!) 158/69 (!) 116/46 (!) 109/51 110/60  Pulse: (!) 57 (!) 58 (!) 55 (!) 58  Resp: 16 18 17 18   Temp: 98.6 F (37 C) 98.3 F (36.8 C) 98.4 F (36.9 C) 98.2 F (36.8 C)  TempSrc: Oral Oral Oral Oral  SpO2: 98% 94% 94% 94%  Weight:      Height:        General: Pt is alert, awake, not in acute distress Cardiovascular: RRR, S1/S2 +, no rubs, no gallops Respiratory: CTA bilaterally, no wheezing, no rhonchi Abdominal: Soft, NT, ND,  bowel sounds + Extremities: no edema, no cyanosis    The results of significant diagnostics from this hospitalization (including imaging, microbiology,  ancillary and laboratory) are listed below for reference.     Microbiology: Recent Results (from the past 240 hours)  Urine Culture     Status: Abnormal   Collection Time: 04/01/24  6:20 PM   Specimen: Urine, Clean Catch  Result Value Ref Range Status   Specimen Description   Final    URINE, CLEAN CATCH Performed at [facility], [address] [place], [place] [address]    Special Requests   Final    NONE Performed at [facility], [address] [place], [place] [address]    Culture >=100,000 COLONIES/mL SERRATIA MARCESCENS (A)  Final   Report Status 04/03/2024 FINAL  Final   Organism ID, Bacteria SERRATIA MARCESCENS (A)  Final      Susceptibility   Serratia marcescens - MIC*    CEFEPIME <=0.12 SENSITIVE Sensitive     CEFTRIAXONE <=0.25 SENSITIVE Sensitive     CIPROFLOXACIN  <=0.25 SENSITIVE Sensitive     GENTAMICIN <=1 SENSITIVE Sensitive     NITROFURANTOIN 128 RESISTANT Resistant     TRIMETH /SULFA  <=20 SENSITIVE Sensitive     * >=100,000 COLONIES/mL SERRATIA MARCESCENS     Labs: BNP (last 3 results) No results for input(s): "BNP" in the last 8760 hours. Basic Metabolic Panel: Recent Labs  Lab 04/05/24 1408 04/05/24 1410  NA 133* 139  K 4.6 4.8  CL 104 104  CO2 24  --   GLUCOSE 117* 119*  BUN 19 21  CREATININE 1.25* 1.30*  CALCIUM  9.1  --    Liver Function Tests: Recent Labs  Lab 04/05/24 1408  AST 17  ALT 21  ALKPHOS 97  BILITOT 0.6  PROT 7.9  ALBUMIN 4.2   No results for input(s): "LIPASE", "AMYLASE" in the last 168 hours. No results for input(s): "AMMONIA" in the last 168 hours. CBC: Recent Labs  Lab 04/05/24 1408 04/05/24 1410  WBC 4.9  --   NEUTROABS 3.7  --   HGB 15.5 15.6  HCT 45.1 46.0  MCV 88.6  --   PLT 183  --    Cardiac Enzymes: No results for input(s): "CKTOTAL", "CKMB", "CKMBINDEX", "TROPONINI" in the last 168 hours. BNP: Invalid input(s): "POCBNP" CBG: No results for input(s): "GLUCAP" in the last 168 hours. D-Dimer No  results for input(s): "DDIMER" in the last 72 hours. Hgb A1c Recent Labs    04/06/24 0354  HGBA1C 6.5*   Lipid Profile Recent Labs    04/06/24 0354  CHOL 99  HDL 29*  LDLCALC 38  TRIG 914*  CHOLHDL 3.4   Thyroid  function studies No results for input(s): "TSH", "T4TOTAL", "T3FREE", "THYROIDAB" in the last 72 hours.  Invalid input(s): "FREET3" Anemia work up No results for input(s): "VITAMINB12", "FOLATE", "FERRITIN", "TIBC", "IRON", "RETICCTPCT" in the last 72 hours. Urinalysis    Component Value Date/Time   COLORURINE YELLOW 06/29/2016 0952   APPEARANCEUR CLEAR 06/29/2016 0952   LABSPEC 1.023 06/29/2016 0952   PHURINE 6.0 06/29/2016 0952   GLUCOSEU NEGATIVE 06/29/2016 0952   HGBUR NEGATIVE 06/29/2016 0952   BILIRUBINUR small (A) 04/01/2024 1813   KETONESUR trace (5) (A) 04/01/2024 1813   KETONESUR NEGATIVE 06/29/2016 0952   PROTEINUR =100 (A) 04/01/2024 1813   PROTEINUR NEGATIVE 06/29/2016 0952   UROBILINOGEN 0.2 04/01/2024 1813   UROBILINOGEN 0.2 08/28/2014 0410   NITRITE Positive (A) 04/01/2024 1813   NITRITE NEGATIVE 06/29/2016 7829  LEUKOCYTESUR Small (1+) (A) 04/01/2024 1813   Sepsis Labs Recent Labs  Lab 04/05/24 1408  WBC 4.9   Microbiology Recent Results (from the past 240 hours)  Urine Culture     Status: Abnormal   Collection Time: 04/01/24  6:20 PM   Specimen: Urine, Clean Catch  Result Value Ref Range Status   Specimen Description   Final    URINE, CLEAN CATCH Performed at [facility] [address], [place], [place] [address]    Special Requests   Final    NONE Performed at [facility], [address] [place], [place] [address]    Culture >=100,000 COLONIES/mL SERRATIA MARCESCENS (A)  Final   Report Status 04/03/2024 FINAL  Final   Organism ID, Bacteria SERRATIA MARCESCENS (A)  Final      Susceptibility   Serratia marcescens - MIC*    CEFEPIME <=0.12 SENSITIVE Sensitive     CEFTRIAXONE <=0.25 SENSITIVE Sensitive     CIPROFLOXACIN   <=0.25 SENSITIVE Sensitive     GENTAMICIN <=1 SENSITIVE Sensitive     NITROFURANTOIN 128 RESISTANT Resistant     TRIMETH /SULFA  <=20 SENSITIVE Sensitive     * >=100,000 COLONIES/mL SERRATIA MARCESCENS     Time coordinating discharge: 35 minutes  SIGNED:   Cornelius Dill, DO [facility] 04/06/2024, 4:13 PM  If 7PM-7AM, please contact night-coverage [email or web address]

## 2024-04-06 NOTE — Evaluation (Signed)
 Physical Therapy Evaluation Patient Details Name: Seth Savage MRN: [id number] DOB: 02/25/1951 Today's Date: 04/06/2024  History of Present Illness  Seth Savage is a 73 y.o. male with medical history significant of COPD, carotid artery disease status post endarterectomy bilaterally, hypertension, hypothyroidism, hyperlipidemia.  Patient presents with left sided visual field defect that started 3 days ago and has gradually improved since the onset.  He describes it as a dark area of the left visual field of his left eye consistent with left temporal hemianopia. Currently, he feels like his vision is back to normal, however he came to the hospital for evaluation.  He denies headaches, numbness, weakness, chest pain, shortness of breath, other vision changes.   Clinical Impression  Patient demonstrates independence with bed mobility, functional transfers, and ambulation with no AD. Pt demonstrates good coordination, and no abnormal sensation at this time. Pt does deomstrate corrective saccades with horizontal testing but no with vertical testing. Pt reports being at his baseline and feels normal. No balance deficits demonstrated during evaluation. Patient discharged from physical therapy to care of nursing for ambulation daily as tolerated for length of stay.         If plan is discharge home, recommend the following: Other (comment) (pt independent)   Can travel by private vehicle        Equipment Recommendations None recommended by PT  Recommendations for Other Services       Functional Status Assessment       Precautions / Restrictions Precautions Precautions: None Recall of Precautions/Restrictions: Intact Restrictions Weight Bearing Restrictions Per Provider Order: No      Mobility  Bed Mobility Overal bed mobility: Independent                  Transfers Overall transfer level: Independent                      Ambulation/Gait Ambulation/Gait  assistance: Independent Gait Distance (Feet): 150 Feet Assistive device: None   Gait velocity: WFL     General Gait Details: [facility]  Stairs            Wheelchair Mobility     Tilt Bed    Modified Rankin (Stroke Patients Only)       Balance Overall balance assessment: Independent                                           Pertinent Vitals/Pain Pain Assessment Pain Assessment: Faces Faces Pain Scale: No hurt    Home Living Family/patient expects to be discharged to:: Private residence Living Arrangements: Spouse/significant other Available Help at Discharge: Family Type of Home: House Home Access: Stairs to enter         Home Equipment: None      Prior Function Prior Level of Function : Independent/Modified Independent             Mobility Comments: Tourist information centre manager, transportation for people with handicap ADLs Comments: independent     Extremity/Trunk Assessment   Upper Extremity Assessment Upper Extremity Assessment: Overall WFL for tasks assessed    Lower Extremity Assessment Lower Extremity Assessment: Overall WFL for tasks assessed    Cervical / Trunk Assessment Cervical / Trunk Assessment: Normal  Communication   Communication Communication: No apparent difficulties    Cognition Arousal: Alert Behavior During Therapy: WFL for tasks assessed/performed   PT -  Cognitive impairments: No apparent impairments                         Following commands: Intact       Cueing Cueing Techniques: Verbal cues     General Comments      Exercises     Assessment/Plan    PT Assessment Patient does not need any further PT services  PT Problem List         PT Treatment Interventions      PT Goals (Current goals can be found in the Care Plan section)  Acute Rehab PT Goals Patient Stated Goal: to return home PT Goal Formulation: With patient/family Time For Goal Achievement: 04/06/24 Potential to  Achieve Goals: Good    Frequency       Co-evaluation               AM-PAC PT "6 Clicks" Mobility  Outcome Measure Help needed turning from your back to your side while in a flat bed without using bedrails?: None Help needed moving from lying on your back to sitting on the side of a flat bed without using bedrails?: None Help needed moving to and from a bed to a chair (including a wheelchair)?: None Help needed standing up from a chair using your arms (e.g., wheelchair or bedside chair)?: None Help needed to walk in hospital room?: None Help needed climbing 3-5 steps with a railing? : None 6 Click Score: 24    End of Session   Activity Tolerance: Patient tolerated treatment well Patient left: in chair;with call bell/phone within reach;with family/visitor present        Time: 4098-1191 PT Time Calculation (min) (ACUTE ONLY): 13 min   Charges:   PT Evaluation $PT Eval Low Complexity: 1 Low PT Treatments $Therapeutic Activity: 8-22 mins PT General Charges $$ ACUTE PT VISIT: 1 Visit         Armond Bertin, PT, DPT [facility] Office: [phone] 1:15 PM, 04/06/24

## 2024-04-07 DIAGNOSIS — E7800 Pure hypercholesterolemia, unspecified: Secondary | ICD-10-CM | POA: Diagnosis not present

## 2024-04-07 DIAGNOSIS — I10 Essential (primary) hypertension: Secondary | ICD-10-CM | POA: Diagnosis not present

## 2024-04-07 DIAGNOSIS — I429 Cardiomyopathy, unspecified: Secondary | ICD-10-CM | POA: Diagnosis not present

## 2024-04-07 DIAGNOSIS — Z09 Encounter for follow-up examination after completed treatment for conditions other than malignant neoplasm: Secondary | ICD-10-CM | POA: Diagnosis not present

## 2024-04-07 DIAGNOSIS — E119 Type 2 diabetes mellitus without complications: Secondary | ICD-10-CM | POA: Diagnosis not present

## 2024-04-10 ENCOUNTER — Ambulatory Visit (INDEPENDENT_AMBULATORY_CARE_PROVIDER_SITE_OTHER): Admitting: Vascular Surgery

## 2024-04-10 ENCOUNTER — Encounter: Payer: Self-pay | Admitting: Neurology

## 2024-04-10 ENCOUNTER — Ambulatory Visit (INDEPENDENT_AMBULATORY_CARE_PROVIDER_SITE_OTHER): Admitting: Neurology

## 2024-04-10 ENCOUNTER — Encounter: Payer: Self-pay | Admitting: Vascular Surgery

## 2024-04-10 VITALS — BP 103/58 | HR 63 | Ht 72.0 in | Wt 213.0 lb

## 2024-04-10 VITALS — BP 122/74 | HR 94

## 2024-04-10 DIAGNOSIS — R5383 Other fatigue: Secondary | ICD-10-CM | POA: Diagnosis not present

## 2024-04-10 DIAGNOSIS — Z9889 Other specified postprocedural states: Secondary | ICD-10-CM

## 2024-04-10 DIAGNOSIS — R0683 Snoring: Secondary | ICD-10-CM | POA: Diagnosis not present

## 2024-04-10 DIAGNOSIS — I639 Cerebral infarction, unspecified: Secondary | ICD-10-CM | POA: Diagnosis not present

## 2024-04-10 MED ORDER — CLOPIDOGREL BISULFATE 75 MG PO TABS: 75.0000 mg | ORAL_TABLET | Freq: Every day | ORAL | 3 refills | Status: AC

## 2024-04-10 NOTE — Progress Notes (Signed)
 [facility] [place]  Patient name: Seth Savage MRN: [id number] DOB: 1951/02/09 Sex: male  REASON FOR VISIT: Follow-up prior carotid surgery  HPI: Seth Savage is a 73 y.o. male here today for follow-up.  He is well-known to me from prior staged bilateral carotid endarterectomies.  Left side was in 2001 and right side 2004.  Had no neurologic deficits.  He does report recent episodes of a tingling sensation in his left chest.  No substernal pain and no symptoms suggestive of angina.  04/10/24: Patient returns to clinic after being lost to follow-up for little over a year.  He was recently seen in the emergency department for evaluation of left eye visual acuity loss.  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  CT angiogram did show some scattered atherosclerotic change to the imaged vessels, but widely patent carotid arteries bilaterally.  We reviewed his scan in detail.  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   Carotid artery disease (HCC)    Left CEA 2001 and right CEA 2004   Childhood asthma    COPD (chronic obstructive pulmonary disease) (HCC)    Essential hypertension    Fatty liver    GERD (gastroesophageal reflux disease)    Goiter    Radioactive iodine  treatment   H pylori ulcer 11/2014   Treated with Prevpac; documented eradication by biopsy in 2018   History of bronchitis    History of kidney stones    History of pneumonia as a child    Hypothyroidism    Migraine    Mixed hyperlipidemia    Neuropathy    Stroke (HCC)     Family History  Problem Relation Age of Onset   COPD Mother    Mental illness Mother    Hypertension Mother    Varicose Veins Mother    Heart attack Mother 41   Hypertension Father    Alcohol  abuse Father    Colon cancer Neg Hx     SOCIAL  HISTORY: Social History   Tobacco Use   Smoking status: Former    Current packs/day: 0.00    Average packs/day: 0.2 packs/day for 45.0 years (9.0 ttl pk-yrs)    Types: Cigarettes    Start date: 12/21/1972    Quit date: 12/21/2017    Years since quitting: 6.3   Smokeless tobacco: Never  Substance Use Topics   Alcohol  use: Yes    Alcohol /week: 0.0 standard drinks of alcohol     Comment: 1-2 beers in 6 months.     Allergies  Allergen Reactions   Lyrica [Pregabalin] Shortness Of Breath   Celecoxib Other (See Comments)    GI bleed, fatigue   Codeine Itching    Current Outpatient Medications  Medication Sig Dispense Refill   amLODipine  (NORVASC ) 10 MG tablet TAKE 1 TABLET BY MOUTH EVERY DAY 90 tablet 3   aspirin  EC 81 MG tablet Take 81 mg by mouth at bedtime.      atorvastatin  (LIPITOR) 40 MG tablet Take 1 tablet (40 mg total) by mouth daily. 30 tablet 11   azelastine  (ASTELIN ) 0.1 % nasal spray Place 1 spray into both nostrils 2 (two) times daily. Use in each nostril as directed 30 mL 0   clopidogrel  (PLAVIX ) 75 MG tablet Take 1 tablet (75 mg total) by mouth daily. 30 tablet 1  dapagliflozin  propanediol (FARXIGA ) 5 MG TABS tablet Take 1 tablet (5 mg total) by mouth daily before breakfast. 30 tablet 2   furosemide  (LASIX ) 20 MG tablet Take 1 tablet (20 mg total) by mouth daily as needed for fluid or edema (shortness of breath). 30 tablet 1   guaiFENesin  (MUCINEX ) 600 MG 12 hr tablet Take 600 mg by mouth 2 (two) times daily as needed for cough or to loosen phlegm.     isosorbide  mononitrate (IMDUR ) 60 MG 24 hr tablet TAKE 1 TABLET BY MOUTH DAILY 90 tablet 3   JARDIANCE 10 MG TABS tablet Take 10 mg by mouth daily.     levothyroxine  (SYNTHROID ) 175 MCG tablet Take 175 mcg by mouth daily.     losartan  (COZAAR ) 50 MG tablet TAKE 1 TABLET BY MOUTH EVERY DAY 90 tablet 1   metoprolol  succinate (TOPROL -XL) 25 MG 24 hr tablet TAKE 1 TABLET BY MOUTH EVERY DAY 90 tablet 1   nitroGLYCERIN   (NITROSTAT ) 0.4 MG SL tablet Place 1 tablet (0.4 mg total) under the tongue every 5 (five) minutes x 3 doses as needed for chest pain (if no relief after 3rd dose, proceed to ED or call 911). 25 tablet 3   pantoprazole  (PROTONIX ) 40 MG tablet Take 40 mg by mouth daily.     sulfamethoxazole -trimethoprim  (BACTRIM  DS) 800-160 MG tablet Take 1 tablet by mouth 2 (two) times daily for 7 days. 14 tablet 0   No current facility-administered medications for this visit.   PHYSICAL EXAM: Vitals:   04/10/24 1116 04/10/24 1119  BP: 104/61 (!) 103/58  Pulse: 63   SpO2: 95%   Weight: 213 lb (96.6 kg)   Height: 6' (1.829 m)    Well-appearing elderly man in no distress Regular rate and rhythm Unlabored breathing Healed neck incisions bilaterally Normal gait and station No cranial nerve abnormalities No weakness in the extremities appreciated  DATA:  CT angiogram head and neck 04/05/2024.  Widely patent cervical carotid arteries bilaterally there is mixed atherosclerotic plaque throughout the visualized arteries from the aortic arch to the skull base.  There is no hemodynamically significant stenosis in cervical carotid arteries.  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 male with history of bilateral carotid ectomy.  Recent PCA territory stroke.  Recommend:  Abstinence from all tobacco products. Blood glucose control with goal A1c < 7%. Blood pressure control with goal blood pressure < 140/90 mmHg. Lipid reduction therapy with goal LDL-C 55mg /dL Aspirin  81mg  PO QD.  Atorvastatin  40-80mg  PO QD (or other "high intensity" statin therapy).  No role for vascular intervention at this time.  Recommend follow-up with our group in 1 year with repeat carotid duplex for surveillance.  Heber Little. Edgardo Goodwill, MD [facility] Phone Number: [phone] 04/10/2024 1:06 PM

## 2024-04-10 NOTE — Progress Notes (Signed)
 Chief Complaint  Patient presents with   Cerebrovascular Accident    RM 15. Dr. Vira Grieves 2014/internal referral for CVA/Received urgent referral via proficient for ischemic stroke/Dr. Harlo Ligas Internal [phone] Stroke: Pt reports having less energy, weakness, and fatigue since stroke. Gets tired and light headed when in the sun/using energy. Pt reported no weakness in limbs       ASSESSMENT AND PLAN  Seth Savage is a 73 y.o. male   Right occipital stroke in May 2025  Most likely due to thrombosis  Multiple vascular risk factor, aging, hypertension, hyperlipidemia, coronary artery disease, history of bilateral endarterectomy, and risk for obstructive sleep apnea  CT angiogram head and neck showed no hemodynamic significant stenosis, evidence of progressive bilateral PCA stenosis  Dual antiplatelet agent aspirin  81+ Plavix  75 mg daily for 3 months then Plavix  alone  Echocardiogram showed mildly decreased ejection fraction 40 to 45%, regional wall motion abnormality, pending cardiology appointment  High risk for obstructive sleep apnea  Refer to sleep study  DIAGNOSTIC DATA (LABS, IMAGING, TESTING) - I reviewed patient records, labs, notes, testing and imaging myself where available.   MEDICAL HISTORY:  Seth Savage is a 73 years old male, accompanied by his wife seen in request by his primary care from [facility] Dr. Darien Eden, Derek Flavors for evaluation of stroke, initial evaluation was on Apr 10, 2024  History is obtained from the patient and review of electronic medical records. I personally reviewed pertinent available imaging films in PACS.   PMHx of  HTN Hypothyroidism HLD CAD COPD Hx of smoker, quit in 2014 History of kidney stone. Right endarterectomy in August 2017 by Dr. Ena Harries Early Left endarterectomy in 2009 for severe asymptomatic left carotid stenosis   He woke up on Mar 25, 2024, noticed difficulty seeing in his left visual field, he was still  able to drive, just have to turn his head to the left to cover his left visual field, he works part-time as a Hospital doctor,  Left visual difficulty last about 2 days, by third day , his vision recovered back to normal  He was sent by primary care to emergency room on May 17, had MRI of the brain, and MRI of orbit, small patchy acute/subacute cortical infarction in the right occipital lobe  CT angiogram of head and neck, evidence of right carotid endarterectomy common and internal carotid arteries are patent bilaterally without hemodynamic significant stenosis, but there was atherosclerotic plaque, progressive atherosclerotic plaque within the right vertebral artery at the V3/4 junction, with moderate to severe stenosis, severe stenosis within bilateral posterior cerebral artery branch at P3 segment level, moderate stenosis within the right cavernous/paraclinoid internal carotid artery  He was on aspirin  81 mg daily, Plavix  75 mg was added on, also switched from Crestor  to current Lipitor 40 mg daily  Laboratory evaluation showed A1c 6.5, LDL 38, negative UDS, alcohol , evidence UTI, is not on treatment  He has a mild snoring, excessive daytime sleepiness, narrow oropharyngeal space, high risk for obstructive sleep apnea  Echocardiogram ejection fraction 40 to 45%, regional wall motion abnormality   PHYSICAL EXAM: Vitals:   04/10/24 1425 04/10/24 1448  BP: (!) 147/67 122/74  Pulse: 94     PHYSICAL EXAMNIATION:  Gen: NAD, conversant, well nourised, well groomed                     Cardiovascular: Regular rate rhythm, no peripheral edema, warm, nontender. Eyes: Conjunctivae clear without exudates or hemorrhage  Neck: Supple, no carotid bruits. Pulmonary: Clear to auscultation bilaterally   NEUROLOGICAL EXAM:  MENTAL STATUS: Speech/cognition: Awake, alert, oriented to history taking and casual conversation CRANIAL NERVES: CN II: Visual fields are full to confrontation. Pupils are round equal  and briskly reactive to light. CN III, IV, VI: extraocular movement are normal. No ptosis. CN V: Facial sensation is intact to light touch CN VII: Face is symmetric with normal eye closure  CN VIII: Hearing is normal to causal conversation. CN IX, X: Phonation is normal. CN XI: Head turning and shoulder shrug are intact CN XII: Narrow pharyngeal space  MOTOR: There is no pronator drift of out-stretched arms. Muscle bulk and tone are normal. Muscle strength is normal.  REFLEXES: Reflexes are 2+ and symmetric at the biceps, triceps, knees, and ankles. Plantar responses are flexor.  SENSORY: Intact to light touch, pinprick and vibratory sensation are intact in fingers and toes.  COORDINATION: There is no trunk or limb dysmetria noted.  GAIT/STANCE: Posture is normal. Gait is steady with normal steps, base, arm swing, and turning. Heel and toe walking are normal. Tandem gait is normal.  Romberg is absent.  REVIEW OF SYSTEMS:  Full 14 system review of systems performed and notable only for as above All other review of systems were negative.   ALLERGIES: Allergies  Allergen Reactions   Lyrica [Pregabalin] Shortness Of Breath   Celecoxib Other (See Comments)    GI bleed, fatigue   Codeine Itching    HOME MEDICATIONS: Current Outpatient Medications  Medication Sig Dispense Refill   amLODipine  (NORVASC ) 10 MG tablet TAKE 1 TABLET BY MOUTH EVERY DAY 90 tablet 3   aspirin  EC 81 MG tablet Take 81 mg by mouth at bedtime.      atorvastatin  (LIPITOR) 40 MG tablet Take 1 tablet (40 mg total) by mouth daily. 30 tablet 11   azelastine  (ASTELIN ) 0.1 % nasal spray Place 1 spray into both nostrils 2 (two) times daily. Use in each nostril as directed 30 mL 0   clopidogrel  (PLAVIX ) 75 MG tablet Take 1 tablet (75 mg total) by mouth daily. 30 tablet 1   dapagliflozin  propanediol (FARXIGA ) 5 MG TABS tablet Take 1 tablet (5 mg total) by mouth daily before breakfast. 30 tablet 2   furosemide   (LASIX ) 20 MG tablet Take 1 tablet (20 mg total) by mouth daily as needed for fluid or edema (shortness of breath). 30 tablet 1   guaiFENesin  (MUCINEX ) 600 MG 12 hr tablet Take 600 mg by mouth 2 (two) times daily as needed for cough or to loosen phlegm.     isosorbide  mononitrate (IMDUR ) 60 MG 24 hr tablet TAKE 1 TABLET BY MOUTH DAILY 90 tablet 3   JARDIANCE 10 MG TABS tablet Take 10 mg by mouth daily.     levothyroxine  (SYNTHROID ) 175 MCG tablet Take 175 mcg by mouth daily.     losartan  (COZAAR ) 50 MG tablet TAKE 1 TABLET BY MOUTH EVERY DAY 90 tablet 1   metoprolol  succinate (TOPROL -XL) 25 MG 24 hr tablet TAKE 1 TABLET BY MOUTH EVERY DAY 90 tablet 1   nitroGLYCERIN  (NITROSTAT ) 0.4 MG SL tablet Place 1 tablet (0.4 mg total) under the tongue every 5 (five) minutes x 3 doses as needed for chest pain (if no relief after 3rd dose, proceed to ED or call 911). 25 tablet 3   pantoprazole  (PROTONIX ) 40 MG tablet Take 40 mg by mouth daily.     sulfamethoxazole -trimethoprim  (BACTRIM  DS) 800-160 MG tablet Take 1  tablet by mouth 2 (two) times daily for 7 days. 14 tablet 0   No current facility-administered medications for this visit.    PAST MEDICAL HISTORY: 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   Carotid artery disease (HCC)    Left CEA 2001 and right CEA 2004   Childhood asthma    COPD (chronic obstructive pulmonary disease) (HCC)    Essential hypertension    Fatty liver    GERD (gastroesophageal reflux disease)    Goiter    Radioactive iodine  treatment   H pylori ulcer 11/2014   Treated with Prevpac; documented eradication by biopsy in 2018   History of bronchitis    History of kidney stones    History of pneumonia as a child    Hypothyroidism    Migraine    Mixed hyperlipidemia    Neuropathy    Stroke [facility])     PAST SURGICAL HISTORY: Past Surgical  History:  Procedure Laterality Date   BIOPSY  05/01/2017   Procedure: BIOPSY;  Surgeon: Suzette Espy, MD;  [facility];  Service: Endoscopy;;  gastric   BIOPSY  08/19/2018   Procedure: BIOPSY;  Surgeon: Suzette Espy, MD;  [facility];  Service: Endoscopy;;  duodenal and gastric   BIOPSY  11/08/2023   Procedure: BIOPSY;  Surgeon: Suzette Espy, MD;  [facility];  Service: Endoscopy;;   CARDIAC CATHETERIZATION N/A 07/05/2016   Procedure: Left Heart Cath and Coronary Angiography;  Surgeon: Odie Benne, MD;  Location: [facility] INVASIVE CV LAB;  Service: Cardiovascular;  Laterality: N/A;   CARDIAC CATHETERIZATION  03/12/2018   CAROTID ENDARTERECTOMY Left ?2009   CARPAL TUNNEL RELEASE Right    CHOLECYSTECTOMY N/A 04/24/2018   Procedure: LAPAROSCOPIC CHOLECYSTECTOMY;  Surgeon: Alanda Allegra, MD;  Location: [facility] ORS;  Service: General;  Laterality: N/A;   COLECTOMY  2000   Secondary to intussuception   COLONOSCOPY  08/24/2004   Normal rectum,Small polyp at 25 cm, cold snared/ The remainder of the colonic mucosa appeared normal   COLONOSCOPY  09/24/2012   Dr. Burrell Casa hemorrhoids-likely source of hematochezia.Colonic diverticulosis   COLONOSCOPY N/A 05/01/2017   Dr. Riley Cheadle: Diverticulosis, next colonoscopy in 5 years.   COLONOSCOPY WITH PROPOFOL  N/A 04/28/2019   Propofol ; Dr. Riley Cheadle; moderate, medium sized grade 1 internal hemorrhoids, diverticulosis in sigmoid and descending colon, otherwise normal.  Repeat in 5 years.   COLONOSCOPY WITH PROPOFOL  N/A 11/08/2023   Procedure: COLONOSCOPY WITH PROPOFOL ;  Surgeon: Suzette Espy, MD;  [facility];  Service: Endoscopy;  Laterality: N/A;  12:45 pm .asa 2   ENDARTERECTOMY Right 07/07/2016   Procedure: ENDARTERECTOMY CAROTID;  Surgeon: Mayo Speck, MD;  Location: [facility] OR;  Service: Vascular;  Laterality: Right;   ESOPHAGOGASTRODUODENOSCOPY N/A 12/09/2014   Dr. Riley Cheadle: Erosive reflux esophagitis. Peptic  Ulcer disease secondary to H.pylori. small hiatal hernia. Nonbleeding duodenal AVM. Duodenal erosions. TREATED WITH PREVPAC   ESOPHAGOGASTRODUODENOSCOPY N/A 03/15/2015   Dr. Riley Cheadle: small hiatal hernia, healed PUD, duodenal AVM.    ESOPHAGOGASTRODUODENOSCOPY N/A 05/01/2017   Dr. Riley Cheadle: LA grade a esophagitis, gastritis, no H pylori   ESOPHAGOGASTRODUODENOSCOPY (EGD) WITH PROPOFOL  N/A 08/19/2018   PROPOFOL ;  Surgeon: Suzette Espy, MD; normal esophagus, mild erythema and erosions in the entire stomach, small hiatal hernia, subtle white papular areas and duodenal bulb second  and third portions of the duodenum.   FRACTURE SURGERY     LEFT HEART CATH AND CORONARY ANGIOGRAPHY N/A 03/12/2018   Procedure: LEFT HEART CATH AND CORONARY ANGIOGRAPHY;  Surgeon: Odie Benne, MD;  Location: [facility] INVASIVE CV LAB;  Service: Cardiovascular;  Laterality: N/A;   Multiple bilateral arm/wrist surgeries after falls     ORIF SHOULDER FRACTURE Left 06/2002   "shattered; put a bunch of metal plates in it"   PATCH ANGIOPLASTY Right 07/07/2016   Procedure: PATCH ANGIOPLASTY USING 0.8CM X 7.6CM HEMASHIELD PATCH;  Surgeon: Mayo Speck, MD;  Location: [facility] OR;  Service: Vascular;  Laterality: Right;   SHOULDER ARTHROSCOPY W/ ROTATOR CUFF REPAIR Right 1999   SHOULDER OPEN ROTATOR CUFF REPAIR Left 1992   Metal implant     FAMILY HISTORY: Family History  Problem Relation Age of Onset   COPD Mother    Mental illness Mother    Hypertension Mother    Varicose Veins Mother    Heart attack Mother 87   Hypertension Father    Alcohol  abuse Father    Colon cancer Neg Hx     SOCIAL HISTORY: Social History   Socioeconomic History   Marital status: Married    Spouse name: Margie   Number of children: 0   Years of education: Ged   Highest education level: Not on file  Occupational History    Comment: Disabled  Tobacco Use   Smoking status: Former    Current packs/day: 0.00    Average packs/day: 0.2 packs/day  for 45.0 years (9.0 ttl pk-yrs)    Types: Cigarettes    Start date: 12/21/1972    Quit date: 12/21/2017    Years since quitting: 6.3   Smokeless tobacco: Never  Vaping Use   Vaping status: Former  Substance and Sexual Activity   Alcohol  use: Yes    Alcohol /week: 0.0 standard drinks of alcohol     Comment: 1-2 beers in 6 months.    Drug use: No   Sexual activity: Not Currently  Other Topics Concern   Not on file  Social History Narrative   Patient lives at home with his wife. [facility]).    Disabled.   Right handed.   Caffeine- two cups daily.   Three step children.   Social Drivers of Corporate investment banker Strain: Not on file  Food Insecurity: No Food Insecurity (04/05/2024)   Hunger Vital Sign    Worried About Running Out of Food in the Last Year: Never true    Ran Out of Food in the Last Year: Never true  Transportation Needs: No Transportation Needs (04/05/2024)   PRAPARE - Administrator, Civil Service (Medical): No    Lack of Transportation (Non-Medical): No  Physical Activity: Not on file  Stress: Not on file  Social Connections: Socially Integrated (04/05/2024)   Social Connection and Isolation Panel [NHANES]    Frequency of Communication with Friends and Family: More than three times a week    Frequency of Social Gatherings with Friends and Family: More than three times a week    Attends Religious Services: More than 4 times per year    Active Member of [employer] or Organizations: Yes    Attends Banker Meetings: More than 4 times per year    Marital Status: Married  Catering manager Violence: Not At Risk (04/05/2024)   Humiliation, Afraid, Rape, and Kick questionnaire    Fear of Current or Ex-Partner: No  Emotionally Abused: No    Physically Abused: No    Sexually Abused: No      Phebe Brasil, M.D. Ph.D.  [facility] [address] [place], [place] [address] Ph: [phone] Fax: [phone]  CC:  Doreene Gammon D, DO [address] [place],  [place] [address]  Orlena Bitters, MD

## 2024-04-17 DIAGNOSIS — Z008 Encounter for other general examination: Secondary | ICD-10-CM | POA: Diagnosis not present

## 2024-04-17 DIAGNOSIS — J449 Chronic obstructive pulmonary disease, unspecified: Secondary | ICD-10-CM | POA: Diagnosis not present

## 2024-04-17 DIAGNOSIS — I509 Heart failure, unspecified: Secondary | ICD-10-CM | POA: Diagnosis not present

## 2024-04-17 DIAGNOSIS — E119 Type 2 diabetes mellitus without complications: Secondary | ICD-10-CM | POA: Diagnosis not present

## 2024-04-17 DIAGNOSIS — I639 Cerebral infarction, unspecified: Secondary | ICD-10-CM | POA: Diagnosis not present

## 2024-04-21 ENCOUNTER — Ambulatory Visit: Attending: Cardiology

## 2024-04-21 DIAGNOSIS — I6523 Occlusion and stenosis of bilateral carotid arteries: Secondary | ICD-10-CM | POA: Diagnosis not present

## 2024-04-23 ENCOUNTER — Ambulatory Visit: Payer: Self-pay | Admitting: Cardiology

## 2024-04-26 ENCOUNTER — Other Ambulatory Visit: Payer: Self-pay | Admitting: Cardiology

## 2024-05-01 ENCOUNTER — Encounter: Payer: Self-pay | Admitting: Student

## 2024-05-01 ENCOUNTER — Ambulatory Visit: Attending: Student | Admitting: Student

## 2024-05-01 VITALS — BP 136/60 | HR 66 | Ht 72.0 in | Wt 216.0 lb

## 2024-05-01 DIAGNOSIS — I5020 Unspecified systolic (congestive) heart failure: Secondary | ICD-10-CM

## 2024-05-01 DIAGNOSIS — I2510 Atherosclerotic heart disease of native coronary artery without angina pectoris: Secondary | ICD-10-CM

## 2024-05-01 DIAGNOSIS — I10 Essential (primary) hypertension: Secondary | ICD-10-CM

## 2024-05-01 DIAGNOSIS — E785 Hyperlipidemia, unspecified: Secondary | ICD-10-CM | POA: Diagnosis not present

## 2024-05-01 DIAGNOSIS — I6523 Occlusion and stenosis of bilateral carotid arteries: Secondary | ICD-10-CM

## 2024-05-01 DIAGNOSIS — Z8673 Personal history of transient ischemic attack (TIA), and cerebral infarction without residual deficits: Secondary | ICD-10-CM | POA: Diagnosis not present

## 2024-05-01 DIAGNOSIS — Z8679 Personal history of other diseases of the circulatory system: Secondary | ICD-10-CM

## 2024-05-01 MED ORDER — ROSUVASTATIN CALCIUM 20 MG PO TABS: 20.0000 mg | ORAL_TABLET | Freq: Every day | ORAL | 3 refills | Status: DC

## 2024-05-01 NOTE — Progress Notes (Signed)
 Cardiology Office Note    Date:  05/01/2024  ID:  Seth Savage, DOB 02-26-1951, MRN [id number] Cardiologist: Teddie Favre, MD    History of Present Illness:    Seth Savage is a 73 y.o. male with past medical history of CAD (mild, nonobstructive disease by cardiac catheterization in 02/2018), carotid artery stenosis (s/p bilateral CEA's), HTN, HLD, GERD and COPD who presents to the office today for hospital follow-up.  He was last examined by Dr. Londa Rival in 03/2024 and reported having NYHA class II dyspnea but no chest pain or palpitations. Had recently been diagnosed with a URI and UTI. No changes were made to his cardiac medications and he was continued on Amlodipine  10 mg daily, ASA 81 mg daily, Imdur  60 mg daily, Losartan  50 mg daily, Toprol -XL 25 mg daily and Crestor  20 mg daily. Repeat carotid dopplers were recommended and he was informed to follow-up in 1 year.    In the interim, he was admitted to [facility] from 5/17 - 04/06/2024 for an acute CVA as Brain MRI showed small, patchy acute and subacute cortical infarcts within the right occipital lobe. Was evaluated by PT and OT with no further follow-up recommended at that time. Neurology did recommend increasing Atorvastatin  to 40 mg daily and being on DAPT with ASA and Plavix  . Echocardiogram was performed as part of his workup and showed his EF was mildly reduced at 40 to 45% with possible basilar to mid inferior lateral wall abnormality but image quality was poor. Was recommended to consider repeat echo with Definity to further evaluate. RV function was normal and he did not have any significant valve abnormalities. He did have carotid dopplers as an outpatient which showed 1 to 39% stenosis bilaterally.  He did follow-up with Neurology in the interim and they recommended DAPT with ASA and Plavix  for 3 months and then Plavix  alone.  In talking the patient and his wife today, he reports overall feeling well since his stroke.He  denies any residual deficits and reports vision returned within several days following his event. He has baseline dyspnea on exertion but no acute changes in this. No recent chest pain or palpitations.  No specific orthopnea, PND or pitting edema. It sounds like he did have candida balanitis while on Farixga and was switched to Jardiance by his PCP. Overall tolerating well thus far. He was previously on Crestor  and was switched to Atorvastatin  during his admission and wishes to switch back to Crestor  as he was overall tolerating it well and LDL was at 38 when checked in 03/2024.  Studies Reviewed:   EKG: EKG is not ordered today.  LHC: 02/2018 Prox RCA lesion is 30% stenosed. Post Atrio lesion is 40% stenosed. Ost RPDA lesion is 20% stenosed. Ost 1st Mrg lesion is 30% stenosed. Ost 2nd Mrg lesion is 30% stenosed. Ost Cx to Prox Cx lesion is 20% stenosed. Ost LAD to Prox LAD lesion is 30% stenosed. The left ventricular systolic function is normal. LV end diastolic pressure is normal. The left ventricular ejection fraction is 55-65% by visual estimate. There is no mitral valve regurgitation.   1. Mild non-obstructive CAD 2. Normal LV systolic function   Recommendations: consider addition of long acting nitrate. Consider non-cardiac causes of chest pain. Continue medical management of mild CAD  Echocardiogram: 03/2024 IMPRESSIONS     1. Limited image quality due to poor sound wave transmission. On the  3-chamber images there appears to be a basilar to min inferoalteral wall  motion abnormality. Consider repeat echo with Definity contrast to furher  evalaute. Left ventricular ejection  fraction, by estimation, is 40 to 45%. The left ventricle has mildly  decreased function. The left ventricle demonstrates regional wall motion  abnormalities (see scoring diagram/findings for description). Left  ventricular diastolic parameters are  consistent with Grade I diastolic dysfunction  (impaired relaxation).   2. Right ventricular systolic function is normal. The right ventricular  size is normal.   3. The mitral valve is normal in structure. Trivial mitral valve  regurgitation. No evidence of mitral stenosis.   4. The aortic valve is normal in structure. Aortic valve regurgitation is  not visualized. No aortic stenosis is present.   5. The inferior vena cava is normal in size with greater than 50%  respiratory variability, suggesting right atrial pressure of 3 mmHg.   Carotid Dopplers: 04/2024 Summary:  Right Carotid: Velocities in the right ICA are consistent with a 1-39%  stenosis.                Non-hemodynamically significant plaque <50% noted in the  CCA. The                 ECA appears <50% stenosed. Patent right endarterectomy  site.   Left Carotid: Velocities in the left ICA are consistent with a 1-39%  stenosis.               Non-hemodynamically significant plaque <50% noted in the  CCA. The                ECA appears <50% stenosed. Patent left endarterectomy site.   Vertebrals:  Bilateral vertebral arteries demonstrate antegrade flow.  Subclavians: Normal flow hemodynamics were seen in bilateral subclavian               arteries.    Physical Exam:   VS:  BP 136/60 (BP Location: Left Arm, Cuff Size: Large)   Pulse 66   Ht 6' (1.829 m)   Wt 216 lb (98 kg)   SpO2 96%   BMI 29.29 kg/m    Wt Readings from Last 3 Encounters:  05/01/24 216 lb (98 kg)  04/10/24 213 lb (96.6 kg)  04/05/24 216 lb 6.5 oz (98.2 kg)     GEN: Well nourished, well developed male appearing in no acute distress NECK: No JVD; No carotid bruits CARDIAC: RRR, no murmurs, rubs, gallops RESPIRATORY:  Clear to auscultation without rales, wheezing or rhonchi  ABDOMEN: Appears non-distended. No obvious abdominal masses. EXTREMITIES: No clubbing or cyanosis. No pitting edema.  Distal pedal pulses are 2+ bilaterally.   Assessment and Plan:   1. Coronary artery disease  involving native coronary artery of native heart without angina pectoris/Newly Diagnosed HFmrEF - Prior cardiac catheterization in 02/2018 showed mild, nonobstructive disease. EF was low normal at 50 to 55% at that time but normal at 55 to 60% by echocardiogram in 06/2018. Recent echo showed his EF was reduced at 40 to 45% but poor image quality and limited study with Definity was recommended. - He denies any recent anginal symptoms as discussed above. Will plan for a follow-up limited echocardiogram given his reduced EF and possible WMA by recent echo. If his EF is in fact reduced and WMA noted, would need to consider repeat ischemic evaluation. - Continue current medical therapy with ASA 81 mg daily, Plavix  75 mg daily (on this per neurology), Crestor  20mg  daily, Lasix  20 mg as needed, Imdur  60 mg daily, Jardiance 10 mg  daily, Losartan  50 mg daily and Toprol -XL 25 mg daily. If EF is reduced by repeat echocardiogram, would switch Losartan  to Entresto.  2. Bilateral carotid artery stenosis - He has a history of bilateral CEA's and recent carotid dopplers in 04/2024 showed patent sites with 1 to 39% stenosis bilaterally. Continue ASA and statin therapy.  3. Essential hypertension - BP is at 136/60 during today's visit. Continue Amlodipine  10 mg daily, Imdur  60 mg daily, Losartan  50 mg daily and Toprol -XL 25 mg daily.  4. Hyperlipidemia LDL goal <70 - FLP in 03/2024 showed total cholesterol 99, triglycerides 162, HDL 29 and LDL 38. He was switched from Crestor  to Atorvastatin  during admission but prefers to be on Crestor  as he was overall tolerating well and LDL was at goal. Will stop Atorvastatin  and switch to Crestor  20 mg daily.  5. History of CVA (cerebrovascular accident) - Diagnosed during recent admission and Neurology did recommend ASA and Plavix  for 3 months and then Plavix  alone.   Signed, Dorma Gash, PA-C

## 2024-05-01 NOTE — Patient Instructions (Signed)
 Medication Instructions:   Stop Atorvastatin  Restart Crestor  20mg  daily.   *If you need a refill on your cardiac medications before your next appointment, please call your pharmacy*  Testing/Procedures:  Echocardiogram - Ultrasound of your heart  Follow-Up: At [facility] [place], you and your health needs are our priority.  As part of our continuing mission to provide you with exceptional heart care, our providers are all part of one team.  This team includes your primary Cardiologist (physician) and Advanced Practice Providers or APPs (Physician Assistants and Nurse Practitioners) who all work together to provide you with the care you need, when you need it.  Your next appointment:   2-3 month(s)  Provider:   You may see Teddie Favre, MD or one of the following Advanced Practice Providers on your designated Care Team:   Woodfin Hays, PA-C  [place], [place] Theotis Flake, [place]     We recommend signing up for the patient portal called MyChart.  Sign up information is provided on this After Visit Summary.  MyChart is used to connect with patients for Virtual Visits (Telemedicine).  Patients are able to view lab/test results, encounter notes, upcoming appointments, etc.  Non-urgent messages can be sent to your provider as well.   To learn more about what you can do with MyChart, go to [email or web address].

## 2024-05-02 ENCOUNTER — Encounter: Payer: Self-pay | Admitting: Student

## 2024-05-12 ENCOUNTER — Ambulatory Visit (HOSPITAL_COMMUNITY)
Admission: RE | Admit: 2024-05-12 | Discharge: 2024-05-12 | Disposition: A | Discharge status: Home / Self Care | Source: Ambulatory Visit | Attending: Student | Admitting: Student

## 2024-05-12 DIAGNOSIS — I5020 Unspecified systolic (congestive) heart failure: Secondary | ICD-10-CM | POA: Diagnosis not present

## 2024-05-12 DIAGNOSIS — I2510 Atherosclerotic heart disease of native coronary artery without angina pectoris: Secondary | ICD-10-CM | POA: Diagnosis not present

## 2024-05-12 LAB — ECHOCARDIOGRAM LIMITED: S' Lateral: 3.4 cm

## 2024-05-12 MED ORDER — PERFLUTREN LIPID MICROSPHERE
1.0000 mL | INTRAVENOUS | Status: AC | PRN
  Administered 2024-05-12: 4 mL via INTRAVENOUS

## 2024-05-12 NOTE — Progress Notes (Signed)
*  PRELIMINARY RESULTS* Echocardiogram Limited 2-D Echocardiogram has been performed with Definity.  Seth Savage 05/12/2024, 9:20 AM

## 2024-05-14 ENCOUNTER — Ambulatory Visit: Payer: Self-pay | Admitting: Student

## 2024-05-14 ENCOUNTER — Telehealth: Payer: Self-pay | Admitting: Student

## 2024-05-14 ENCOUNTER — Telehealth: Payer: Self-pay | Admitting: Pharmacy Technician

## 2024-05-14 ENCOUNTER — Other Ambulatory Visit (HOSPITAL_COMMUNITY): Payer: Self-pay

## 2024-05-14 MED ORDER — EMPAGLIFLOZIN 10 MG PO TABS: 10.0000 mg | ORAL_TABLET | Freq: Every day | ORAL | 0 refills | Status: DC

## 2024-05-14 NOTE — Telephone Encounter (Signed)
 Patient Advocate Encounter   The patient was approved for a [employer] grant that will help cover the cost of Jardiance Total amount awarded, 4500.00.  Effective: 04/14/24 - 04/13/25   APW:[id number] ERW:[id number] Hmnle:[id number] PI:[id number]  [employer] ID: [id number]   Pharmacy provided with approval and processing information. Patient informed via call

## 2024-05-14 NOTE — Telephone Encounter (Signed)
    Called the patient to review his echocardiogram results and overall improved as compared to prior imaging in 03/2024 but image quality was poor at that time. EF is at 45 to 50% which has been noted on prior results as EF was mildly reduced at 50 to 55% in 2017 and cath showed mild, nonobstructive CAD at that time. Repeat echocardiogram also showed global HK as compared to specific regional WMA. Right ventricular function normal.  He is already on medical therapy with Jardiance 10 mg daily, Toprol -XL 25 mg daily and Losartan  50 mg daily. Reports Bernadine is costing over $160 per month which is not affordable. Will route today's note to our clinical team to see if samples are available. Will also ask them to route to the pharmacy team in [place] to see if patient assistance information can be provided to him for Jardiance (previously intolerant to Farxiga ). Could possibly switch Losartan  to Entresto but this might be cost prohibitive and would initially focus on getting patient assistance for his SGLT2 inhibitor improved.  Signed, Laymon CHRISTELLA Qua, PA-C 05/14/2024, 1:54 PM Pager: [phone]

## 2024-05-14 NOTE — Telephone Encounter (Signed)
 Samples at office,left message for patient

## 2024-05-22 ENCOUNTER — Telehealth: Payer: Self-pay | Admitting: Neurology

## 2024-05-22 ENCOUNTER — Ambulatory Visit: Admitting: Neurology

## 2024-05-22 ENCOUNTER — Encounter: Payer: Self-pay | Admitting: Neurology

## 2024-05-22 VITALS — BP 125/65 | HR 55 | Ht 72.0 in | Wt 217.0 lb

## 2024-05-22 DIAGNOSIS — Z9189 Other specified personal risk factors, not elsewhere classified: Secondary | ICD-10-CM | POA: Diagnosis not present

## 2024-05-22 DIAGNOSIS — R351 Nocturia: Secondary | ICD-10-CM

## 2024-05-22 DIAGNOSIS — Z8673 Personal history of transient ischemic attack (TIA), and cerebral infarction without residual deficits: Secondary | ICD-10-CM

## 2024-05-22 DIAGNOSIS — I2510 Atherosclerotic heart disease of native coronary artery without angina pectoris: Secondary | ICD-10-CM

## 2024-05-22 DIAGNOSIS — E663 Overweight: Secondary | ICD-10-CM

## 2024-05-22 DIAGNOSIS — R0683 Snoring: Secondary | ICD-10-CM

## 2024-05-22 NOTE — Telephone Encounter (Signed)
 Patient returned call, He is asking when he can go to work after stroke 04/03/24. 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  he saw Dr. Buck for sleep this morning sleep and says he was cleared by cardiology and ophthalmology. He reports no residual side effects. Advised I would send to Dr. Onita to review and request eye records. Patient appreciative.

## 2024-05-22 NOTE — Telephone Encounter (Signed)
 NPSG [facility] pending

## 2024-05-22 NOTE — Telephone Encounter (Signed)
 Patient came to check out and asked when he would be able to go back to work. He said that he was not able to go back until Dr. Onita clears him. Please call patient to discuss. Best contact # is [phone]

## 2024-05-22 NOTE — Telephone Encounter (Signed)
 Call to patient, gave Dr. Onita recommendations that if was cleared by ophthalmology she has no limitations on driving at previous level. Patient appreciative. Asked that my chart message be sent with recommendations.

## 2024-05-22 NOTE — Telephone Encounter (Signed)
 MRI in May 2025 only showed small right occipital cortical stroke, examination showed no significant visual field deficit  If he is cleared by ophthalmologist, I saw no limitation for him to go back to drive at previous level

## 2024-05-22 NOTE — Telephone Encounter (Signed)
 Call to patient, no answer. Left message to return call

## 2024-05-22 NOTE — Progress Notes (Signed)
 Subjective:    Patient ID: Seth Savage is a 73 y.o. male.  HPI    True Mar, MD, PhD [facility] [address], [address] [address] [place], [place] [address]  Dear Seth Savage,  I saw your patient, Seth Savage, upon your kind request in my sleep clinic today for additional consultation of his sleep disorder, in particular, concern for underlying obstructive sleep apnea.  The patient is accompanied by his wife today.  As you know, Seth Savage is a 73 year old male with an underlying complex medical history of stroke in May 2025, hypertension, hyperlipidemia, coronary artery disease, prior smoking, COPD, history of kidney stone, carotid artery disease with status post bilateral carotid endarterectomies, hypothyroidism, and overweight state, who reports snoring he sleeps a little better.  His snoring is not as prominent per wife's report.  He denies any recurrent morning headaches.  He does have nocturia about once per average night. His Epworth sleepiness score is 3 out of 24, fatigue severity score is 15 out of 63.  I reviewed your office note from 04/10/2024.  He is currently not working.  He works in patient transportation.  He sleeps with 1 pillow.  He does watch TV in his bedroom but they turn the TV off around 10 or 10:30 PM.  Bedtime is around 10 and rise time between 6:30 AM and 7 AM.  He is not aware of any family history of sleep apnea.  He drinks caffeine in the form of coffee, 1 cup in the morning and alcohol  rarely.  He quit smoking 10 or 11 years ago.  They have a small dog in the household.  His Past Medical History Is Significant For: 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   Carotid artery disease (HCC)    Left CEA 2001 and right CEA 2004   Childhood asthma    COPD (chronic obstructive pulmonary disease) (HCC)    Essential  hypertension    Fatty liver    GERD (gastroesophageal reflux disease)    Goiter    Radioactive iodine  treatment   H pylori ulcer 11/2014   Treated with Prevpac; documented eradication by biopsy in 2018   History of bronchitis    History of kidney stones    History of pneumonia as a child    Hypothyroidism    Migraine    Mixed hyperlipidemia    Neuropathy    Stroke [facility])     His Past Surgical History Is Significant For: Past Surgical History:  Procedure Laterality Date   BIOPSY  05/01/2017   Procedure: BIOPSY;  Surgeon: Shaaron Lamar HERO, MD;  [facility];  Service: Endoscopy;;  gastric   BIOPSY  08/19/2018   Procedure: BIOPSY;  Surgeon: Shaaron Lamar HERO, MD;  [facility];  Service: Endoscopy;;  duodenal and gastric   BIOPSY  11/08/2023   Procedure: BIOPSY;  Surgeon: Shaaron Lamar HERO, MD;  [facility];  Service: Endoscopy;;   CARDIAC CATHETERIZATION N/A 07/05/2016   Procedure: Left Heart Cath and Coronary Angiography;  Surgeon: Lonni JONETTA Cash, MD;  Location: [facility] INVASIVE CV LAB;  Service: Cardiovascular;  Laterality: N/A;   CARDIAC CATHETERIZATION  03/12/2018   CAROTID ENDARTERECTOMY Left ?2009   CARPAL TUNNEL RELEASE Right    CHOLECYSTECTOMY N/A 04/24/2018   Procedure: LAPAROSCOPIC CHOLECYSTECTOMY;  Surgeon: Mavis Anes,  MD;  Location: [facility];  Service: General;  Laterality: N/A;   COLECTOMY  2000   Secondary to intussuception   COLONOSCOPY  08/24/2004   Normal rectum,Small polyp at 25 cm, cold snared/ The remainder of the colonic mucosa appeared normal   COLONOSCOPY  09/24/2012   Dr. Lenox hemorrhoids-likely source of hematochezia.Colonic diverticulosis   COLONOSCOPY N/A 05/01/2017   Dr. Shaaron: Diverticulosis, next colonoscopy in 5 years.   COLONOSCOPY WITH PROPOFOL  N/A 04/28/2019   Propofol ; Dr. Shaaron; moderate, medium sized grade 1 internal hemorrhoids, diverticulosis in sigmoid and descending colon, otherwise normal.  Repeat in 5  years.   COLONOSCOPY WITH PROPOFOL  N/A 11/08/2023   Procedure: COLONOSCOPY WITH PROPOFOL ;  Surgeon: Shaaron Lamar HERO, MD;  [facility];  Service: Endoscopy;  Laterality: N/A;  12:45 pm .asa 2   ENDARTERECTOMY Right 07/07/2016   Procedure: ENDARTERECTOMY CAROTID;  Surgeon: Krystal JULIANNA Doing, MD;  Location: [facility] OR;  Service: Vascular;  Laterality: Right;   ESOPHAGOGASTRODUODENOSCOPY N/A 12/09/2014   Dr. Shaaron: Erosive reflux esophagitis. Peptic Ulcer disease secondary to H.pylori. small hiatal hernia. Nonbleeding duodenal AVM. Duodenal erosions. TREATED WITH PREVPAC   ESOPHAGOGASTRODUODENOSCOPY N/A 03/15/2015   Dr. Shaaron: small hiatal hernia, healed PUD, duodenal AVM.    ESOPHAGOGASTRODUODENOSCOPY N/A 05/01/2017   Dr. Shaaron: LA grade a esophagitis, gastritis, no H pylori   ESOPHAGOGASTRODUODENOSCOPY (EGD) WITH PROPOFOL  N/A 08/19/2018   PROPOFOL ;  Surgeon: Shaaron Lamar HERO, MD; normal esophagus, mild erythema and erosions in the entire stomach, small hiatal hernia, subtle white papular areas and duodenal bulb second and third portions of the duodenum.   FRACTURE SURGERY     LEFT HEART CATH AND CORONARY ANGIOGRAPHY N/A 03/12/2018   Procedure: LEFT HEART CATH AND CORONARY ANGIOGRAPHY;  Surgeon: Verlin Lonni BIRCH, MD;  Location: [facility] INVASIVE CV LAB;  Service: Cardiovascular;  Laterality: N/A;   Multiple bilateral arm/wrist surgeries after falls     ORIF SHOULDER FRACTURE Left 06/2002   shattered; put a bunch of metal plates in it   [facility] ANGIOPLASTY Right 07/07/2016   Procedure: PATCH ANGIOPLASTY USING 0.8CM X 7.6CM HEMASHIELD PATCH;  Surgeon: Krystal JULIANNA Doing, MD;  Location: [facility] OR;  Service: Vascular;  Laterality: Right;   SHOULDER ARTHROSCOPY W/ ROTATOR CUFF REPAIR Right 1999   SHOULDER OPEN ROTATOR CUFF REPAIR Left 1992   Metal implant     His Family History Is Significant For: Family History  Problem Relation Age of Onset   COPD Mother    Mental illness Mother    Hypertension Mother     Varicose Veins Mother    Heart attack Mother 81   Hypertension Father    Alcohol  abuse Father    Colon cancer Neg Hx     His Social History Is Significant For: Social History   Socioeconomic History   Marital status: Married    Spouse name: Seth Savage   Number of children: 0   Years of education: Ged   Highest education level: Not on file  Occupational History    Comment: Disabled  Tobacco Use   Smoking status: Former    Current packs/day: 0.00    Average packs/day: 0.2 packs/day for 45.0 years (9.0 ttl pk-yrs)    Types: Cigarettes    Start date: 12/21/1972    Quit date: 12/21/2017    Years since quitting: 6.4   Smokeless tobacco: Never  Vaping Use   Vaping status: Former  Substance and Sexual Activity   Alcohol  use: Yes    Alcohol /week: 0.0 standard  drinks of alcohol     Comment: 1-2 beers in 6 months.    Drug use: No   Sexual activity: Not Currently  Other Topics Concern   Not on file  Social History Narrative   Patient lives at home with his wife. [facility]).    Disabled.   Right handed.   Caffeine- 1 cup daily.   Three step children.   Social Drivers of Corporate investment banker Strain: Not on file  Food Insecurity: No Food Insecurity (04/05/2024)   Hunger Vital Sign    Worried About Running Out of Food in the Last Year: Never true    Ran Out of Food in the Last Year: Never true  Transportation Needs: No Transportation Needs (04/05/2024)   PRAPARE - Administrator, Civil Service (Medical): No    Lack of Transportation (Non-Medical): No  Physical Activity: Not on file  Stress: Not on file  Social Connections: Socially Integrated (04/05/2024)   Social Connection and Isolation Panel    Frequency of Communication with Friends and Family: More than three times a week    Frequency of Social Gatherings with Friends and Family: More than three times a week    Attends Religious Services: More than 4 times per year    Active Member of [employer] or Organizations: Yes     Attends Engineer, structural: More than 4 times per year    Marital Status: Married    His Allergies Are:  Allergies  Allergen Reactions   Lyrica [Pregabalin] Shortness Of Breath   Celecoxib Other (See Comments)    GI bleed, fatigue   Codeine Itching  :   His Current Medications Are:  Outpatient Encounter Medications as of 05/22/2024  Medication Sig   amLODipine  (NORVASC ) 10 MG tablet TAKE 1 TABLET BY MOUTH EVERY DAY   aspirin  EC 81 MG tablet Take 81 mg by mouth at bedtime.    azelastine  (ASTELIN ) 0.1 % nasal spray Place 1 spray into both nostrils 2 (two) times daily. Use in each nostril as directed   clopidogrel  (PLAVIX ) 75 MG tablet Take 1 tablet (75 mg total) by mouth daily.   empagliflozin  (JARDIANCE ) 10 MG TABS tablet Take 1 tablet (10 mg total) by mouth daily before breakfast.   furosemide  (LASIX ) 20 MG tablet Take 1 tablet (20 mg total) by mouth daily as needed for fluid or edema (shortness of breath).   isosorbide  mononitrate (IMDUR ) 60 MG 24 hr tablet TAKE 1 TABLET BY MOUTH DAILY   JARDIANCE  10 MG TABS tablet Take 10 mg by mouth daily.   levothyroxine  (SYNTHROID ) 175 MCG tablet Take 175 mcg by mouth daily.   losartan  (COZAAR ) 50 MG tablet TAKE 1 TABLET BY MOUTH EVERY DAY   metoprolol  succinate (TOPROL -XL) 25 MG 24 hr tablet TAKE 1 TABLET BY MOUTH EVERY DAY   nitroGLYCERIN  (NITROSTAT ) 0.4 MG SL tablet Place 1 tablet (0.4 mg total) under the tongue every 5 (five) minutes x 3 doses as needed for chest pain (if no relief after 3rd dose, proceed to ED or call 911).   pantoprazole  (PROTONIX ) 40 MG tablet Take 40 mg by mouth daily.   rosuvastatin  (CRESTOR ) 20 MG tablet Take 1 tablet (20 mg total) by mouth daily.   guaiFENesin  (MUCINEX ) 600 MG 12 hr tablet Take 600 mg by mouth 2 (two) times daily as needed for cough or to loosen phlegm. (Patient not taking: Reported on 05/22/2024)   No facility-administered encounter medications on file as of 05/22/2024.  :  Review of  Systems:  Out of a complete 14 point review of systems, all are reviewed and negative with the exception of these symptoms as listed below:  Review of Systems  Neurological:        Patient is here with his wife for sleep consult, referred from Dr Onita. Patient and wife report that his snoring stopped when they got a new bed. He states he feels great. He doesn't feel sleepy during the day. He states he had a sleep study a long time ago here in [place]. He thinks his grandmother dozed off easily but was not on cpap. ESS 3 FSS 15     Objective:  Neurological Exam  Physical Exam Physical Examination:   Vitals:   05/22/24 0917  BP: 125/65  Pulse: (!) 55    General Examination: The patient is a very pleasant 73 y.o. male in no acute distress. He appears well-developed and well-nourished and well groomed.   HEENT: Normocephalic, atraumatic, pupils are equal, round and reactive to light, corrective eyeglasses in place.  Extraocular tracking is good without limitation to gaze excursion or nystagmus noted. Hearing is grossly intact. Face is symmetric with normal facial animation. Speech is clear with no dysarthria noted. There is no hypophonia. There is no lip, neck/head, jaw or voice tremor. Neck is supple with full range of passive and active motion. There are no carotid bruits on auscultation. Oropharynx exam reveals: mild mouth dryness, adequate dental hygiene and moderate airway crowding, due to Mallampati class III, redundant soft palate and wider uvula noted.  Tonsils not fully visualized.  Tongue protrudes centrally and palate elevates symmetrically.  Neck circumference 18 inches, unremarkable scars bilateral carotid areas.  Chest: Clear to auscultation without wheezing, rhonchi or crackles noted.  Heart: S1+S2+0, regular and normal without murmurs, rubs or gallops noted.   Abdomen: Soft, non-tender and non-distended.  Extremities: There is no obvious swelling in the distal  lower extremities bilaterally.   Skin: Warm and dry without trophic changes noted.   Musculoskeletal: exam reveals no obvious joint deformities.   Neurologically:  Mental status: The patient is awake, alert and oriented in all 4 spheres. His immediate and remote memory, attention, language skills and fund of knowledge are appropriate. There is no evidence of aphasia, agnosia, apraxia or anomia. Speech is clear with normal prosody and enunciation. Thought process is linear. Mood is normal and affect is normal.  Cranial nerves II - XII are as described above under HEENT exam.  Motor exam: Normal bulk, strength and tone is noted. There is no obvious action or resting tremor.  Fine motor skills and coordination: grossly intact.  Cerebellar testing: No dysmetria or intention tremor. There is no truncal or gait ataxia.  Sensory exam: intact to light touch in the upper and lower extremities.  Gait, station and balance: He stands easily. No veering to one side is noted. No leaning to one side is noted. Posture is age-appropriate and stance is narrow based. Gait shows normal stride length and normal pace. No problems turning are noted.   Assessment and Plan:   In summary, Seth Savage is a very pleasant 73 y.o.-year old male with an underlying complex medical history of stroke in May 2025, hypertension, hyperlipidemia, coronary artery disease, prior smoking, COPD, history of kidney stone, carotid artery disease with status post bilateral carotid endarterectomies, hypothyroidism, and overweight state, whose history and physical exam are concerning for sleep disordered breathing, particularly obstructive sleep apnea (OSA). A laboratory  attended sleep study is typically considered gold standard for evaluation of sleep disordered breathing.   I had a long chat with the patient and his wife about my findings and the diagnosis of sleep apnea, particularly OSA, its prognosis and treatment options. We talked  about medical/conservative treatments, surgical interventions and non-pharmacological approaches for symptom control. I explained, in particular, the risks and ramifications of untreated moderate to severe OSA, especially with respect to developing cardiovascular disease down the road, including congestive heart failure (CHF), difficult to treat hypertension, cardiac arrhythmias (particularly A-fib), neurovascular complications including TIA, stroke and dementia. Even type 2 diabetes has, in part, been linked to untreated OSA. Symptoms of untreated OSA may include (but may not be limited to) daytime sleepiness, nocturia (i.e. frequent nighttime urination), memory problems, mood irritability and suboptimally controlled or worsening mood disorder such as depression and/or anxiety, lack of energy, lack of motivation, physical discomfort, as well as recurrent headaches, especially morning or nocturnal headaches. We talked about the importance of maintaining a healthy lifestyle and striving for healthy weight. In addition, we talked about the importance of striving for and maintaining good sleep hygiene. I recommended a sleep study at this time. I outlined the differences between a laboratory attended sleep study which is considered more comprehensive and accurate over the option of a home sleep test (HST); the latter may lead to underestimation of sleep disordered breathing in some instances and does not help with diagnosing upper airway resistance syndrome and is not accurate enough to diagnose primary central sleep apnea typically. I outlined possible surgical and non-surgical treatment options of OSA, including the use of a positive airway pressure (PAP) device (i.e. CPAP, AutoPAP/APAP or BiPAP in certain circumstances), a custom-made dental device (aka oral appliance, which would require a referral to a specialist dentist or orthodontist typically, and is generally speaking not considered for patients with full  dentures or edentulous state), upper airway surgical options, such as traditional UPPP (which is not considered a first-line treatment) or the Inspire device (hypoglossal nerve stimulator, which would involve a referral for consultation with an ENT surgeon, after careful selection, following inclusion criteria - also not first-line treatment). I explained the PAP treatment option to the patient in detail, as this is generally considered first-line treatment.  The patient indicated that he would be willing to try PAP therapy, if the need arises. I explained the importance of being compliant with PAP treatment, not only for insurance purposes but primarily to improve patient's symptoms symptoms, and for the patient's long term health benefit, including to reduce His cardiovascular risks longer-term.    We will pick up our discussion about the next steps and treatment options after testing.  We will keep him posted as to the test results by phone call and/or MyChart messaging where possible.  We will plan to follow-up in sleep clinic accordingly as well.  I answered all their questions today and the patient and his wife were in agreement.   I encouraged him to call with any interim questions, concerns, problems or updates or email us  through MyChart.  Generally speaking, sleep test authorizations may take up to 2 weeks, sometimes less, sometimes longer, the patient is encouraged to get in touch with us  if they do not hear back from the sleep lab staff directly within the next 2 weeks.  Thank you very much for allowing me to participate in the care of this nice patient. If I can be of any further assistance to you please do  not hesitate to talk to me.    Sincerely,   True Mar, MD, PhD

## 2024-06-03 NOTE — Telephone Encounter (Signed)
 NPSG still pending

## 2024-06-03 NOTE — Telephone Encounter (Signed)
 NPSG [employer] health auth: [id number] (exp. 05/22/24 to 08/19/24)

## 2024-06-05 NOTE — Telephone Encounter (Signed)
 NPSG [employer] health auth: [id number] (exp. 05/22/24 to 08/19/24)   Patient is scheduled at [facility] for 07/04/2024 at 8 pm. Mailed packet and sent mychart

## 2024-07-03 ENCOUNTER — Other Ambulatory Visit: Payer: Self-pay | Admitting: Cardiology

## 2024-07-04 ENCOUNTER — Ambulatory Visit: Admitting: Neurology

## 2024-07-04 DIAGNOSIS — E663 Overweight: Secondary | ICD-10-CM

## 2024-07-04 DIAGNOSIS — R351 Nocturia: Secondary | ICD-10-CM

## 2024-07-04 DIAGNOSIS — R0683 Snoring: Secondary | ICD-10-CM

## 2024-07-04 DIAGNOSIS — Z9189 Other specified personal risk factors, not elsewhere classified: Secondary | ICD-10-CM

## 2024-07-04 DIAGNOSIS — Z8673 Personal history of transient ischemic attack (TIA), and cerebral infarction without residual deficits: Secondary | ICD-10-CM

## 2024-07-04 DIAGNOSIS — I2510 Atherosclerotic heart disease of native coronary artery without angina pectoris: Secondary | ICD-10-CM

## 2024-07-04 DIAGNOSIS — G4720 Circadian rhythm sleep disorder, unspecified type: Secondary | ICD-10-CM

## 2024-07-09 NOTE — Procedures (Unsigned)
 Physician Interpretation: Please see link under Procedure Tab or under Encounters tab for physician report, technical report, as well as O2 titration and/or PAP titration tables (if applicable).   Referred by: Dr. Modena Callander   History and Indication for Testing (obtained from visit note dated 05/22/2024): 73 year old male with an underlying complex medical history of stroke in May 2025, hypertension, hyperlipidemia, coronary artery disease, prior smoking, COPD, history of kidney stone, carotid artery disease with status post bilateral carotid endarterectomies, hypothyroidism, and overweight state, who reports snoring and nocturia. His Epworth sleepiness score is 3 out of 24, fatigue severity score is 15 out of 63.     Review of the EEG showed no abnormal electrical discharges and symmetrical bihemispheric findings.     EKG: The EKG revealed normal sinus rhythm (NSR). ***   AUDIO/VIDEO REVIEW: The audio and video review did not show any abnormal or unusual behaviors, movements, phonations or vocalizations. The patient took *** restroom breaks. Snoring was noted, ***   POST-STUDY QUESTIONNAIRE: Post study, the patient indicated, that sleep was *** the same as usual.    IMPRESSION:    Obstructive Sleep Apnea (OSA), *** ***Central Sleep Apnea (CSA) ***Primary Snoring ***Primary Central Sleep Apnea ***Complex Sleep Apnea ***PLMD (periodic limb movement disorder [of sleep]) ***Dysfunctions associated with sleep stages or arousal from sleep ***Non-specific abnormal electrocardiogram (EKG) ***Poor sleep pattern ***Inconclusive Test   RECOMMENDATIONS:         I certify that I have reviewed the entire raw data recording prior to the issuance of this report in accordance with the Standards of Accreditation of the American Academy of Sleep Medicine (AASM).   True Mar, MD, PhD Medical Director, [facility]) Diplomat, ABPN (Neurology and  Sleep)

## 2024-07-10 ENCOUNTER — Ambulatory Visit: Payer: Self-pay | Admitting: Neurology

## 2024-07-16 DIAGNOSIS — N481 Balanitis: Secondary | ICD-10-CM | POA: Diagnosis not present

## 2024-07-16 DIAGNOSIS — Z125 Encounter for screening for malignant neoplasm of prostate: Secondary | ICD-10-CM | POA: Diagnosis not present

## 2024-07-16 DIAGNOSIS — Z299 Encounter for prophylactic measures, unspecified: Secondary | ICD-10-CM | POA: Diagnosis not present

## 2024-07-16 DIAGNOSIS — Z79899 Other long term (current) drug therapy: Secondary | ICD-10-CM | POA: Diagnosis not present

## 2024-07-16 DIAGNOSIS — E119 Type 2 diabetes mellitus without complications: Secondary | ICD-10-CM | POA: Diagnosis not present

## 2024-07-16 DIAGNOSIS — Z0000 Encounter for general adult medical examination without abnormal findings: Secondary | ICD-10-CM | POA: Diagnosis not present

## 2024-07-16 DIAGNOSIS — I10 Essential (primary) hypertension: Secondary | ICD-10-CM | POA: Diagnosis not present

## 2024-07-16 DIAGNOSIS — E039 Hypothyroidism, unspecified: Secondary | ICD-10-CM | POA: Diagnosis not present

## 2024-07-16 DIAGNOSIS — I429 Cardiomyopathy, unspecified: Secondary | ICD-10-CM | POA: Diagnosis not present

## 2024-08-08 ENCOUNTER — Encounter: Payer: Self-pay | Admitting: Student

## 2024-08-08 ENCOUNTER — Ambulatory Visit: Attending: Student | Admitting: Student

## 2024-08-08 VITALS — BP 112/70 | HR 62 | Ht 72.0 in | Wt 214.0 lb

## 2024-08-08 DIAGNOSIS — E785 Hyperlipidemia, unspecified: Secondary | ICD-10-CM | POA: Diagnosis not present

## 2024-08-08 DIAGNOSIS — I10 Essential (primary) hypertension: Secondary | ICD-10-CM | POA: Diagnosis not present

## 2024-08-08 DIAGNOSIS — I2510 Atherosclerotic heart disease of native coronary artery without angina pectoris: Secondary | ICD-10-CM

## 2024-08-08 DIAGNOSIS — I6523 Occlusion and stenosis of bilateral carotid arteries: Secondary | ICD-10-CM | POA: Diagnosis not present

## 2024-08-08 DIAGNOSIS — I5020 Unspecified systolic (congestive) heart failure: Secondary | ICD-10-CM | POA: Diagnosis not present

## 2024-08-08 NOTE — Patient Instructions (Signed)
 Medication Instructions:  Your physician recommends that you continue on your current medications as directed. Please refer to the Current Medication list given to you today.  *If you need a refill on your cardiac medications before your next appointment, please call your pharmacy*  Lab Work: NONE   If you have labs (blood work) drawn today and your tests are completely normal, you will receive your results only by: MyChart Message (if you have MyChart) OR A paper copy in the mail If you have any lab test that is abnormal or we need to change your treatment, we will call you to review the results.  Testing/Procedures: NONE   Follow-Up: At [facility], you and your health needs are our priority.  As part of our continuing mission to provide you with exceptional heart care, our providers are all part of one team.  This team includes your primary Cardiologist (physician) and Advanced Practice Providers or APPs (Physician Assistants and Nurse Practitioners) who all work together to provide you with the care you need, when you need it.  Your next appointment:   4 -5 month(s)  Provider:   Jayson Sierras, MD or Laymon Qua, PA-C    We recommend signing up for the patient portal called MyChart.  Sign up information is provided on this After Visit Summary.  MyChart is used to connect with patients for Virtual Visits (Telemedicine).  Patients are able to view lab/test results, encounter notes, upcoming appointments, etc.  Non-urgent messages can be sent to your provider as well.   To learn more about what you can do with MyChart, go to [email or web address].   Other Instructions Thank you for choosing [facility]!

## 2024-08-08 NOTE — Progress Notes (Signed)
 Cardiology Office Note    Date:  08/08/2024  ID:  Seth Savage, DOB 1951/04/17, MRN [id number] Cardiologist: Jayson Sierras, MD Cardiology APP:  Johnson Laymon HERO, PA-C { : History of Present Illness:    Seth Savage is a 73 y.o. 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  He was last examined by myself in 04/2024 and had recently been admitted in May for an acute CVA. Echocardiogram during his workup had shown his EF was mildly reduced at 40 to 45% with possible WMA but image quality was poor. Neurology did recommend DAPT with ASA and Plavix  for 3 months and then Plavix  alone. At the time of follow-up, he denied any residual deficits and reported having baseline dyspnea on exertion but no acute changes in this. A follow-up limited echocardiogram was recommended and if his EF remained reduced with wall motion abnormalities noted, would need to consider ischemic evaluation. Was not an ideal candidate for ischemic evaluation at that time given his recent CVA. He was previously on Crestor  with well-controlled LDL at 38 but this was switched to Atorvastatin  for unclear reasons during admission and he was switched back to Crestor  20 mg daily.  Repeat limited echocardiogram showed his EF was at 45 to 50% with global hypokinesis which was similar to prior studies as his EF had been at 50 to 55% in 2017. He reported Jardiance  was not affordable for him and was previously intolerant to Farxiga , therefore this was sent to patient assistance to see if options were available to help with this. He was approved for a [employer].   In talking with the patient and his wife today, he reports overall doing well since his last office visit. He has  been staying active around his home and enjoys working on cars and doing yard work and denies any recent chest pain or progressive dyspnea on exertion with this. Reports this past Wednesday that he developed a bee sting sensation intermittently along his chest which would occur while driving. He took several sublingual nitroglycerin  throughout the day and pain eventually resolved. He denies any recurrence since. Breathing has been stable and he denies any orthopnea, PND, pitting edema or palpitations.  Studies Reviewed:   EKG: EKG is ordered today and shows NSR, HR 61 with known LBBB.   LHC: 02/2018 Prox RCA lesion is 30% stenosed. Post Atrio lesion is 40% stenosed. Ost RPDA lesion is 20% stenosed. Ost 1st Mrg lesion is 30% stenosed. Ost 2nd Mrg lesion is 30% stenosed. Ost Cx to Prox Cx lesion is 20% stenosed. Ost LAD to Prox LAD lesion is 30% stenosed. The left ventricular systolic function is normal. LV end diastolic pressure is normal. The left ventricular ejection fraction is 55-65% by visual estimate. There is no mitral valve regurgitation.   1. Mild non-obstructive CAD 2. Normal LV systolic function   Recommendations: consider addition of long acting nitrate. Consider non-cardiac causes of chest pain. Continue medical management of mild CAD  Limited Echo: 04/2024 IMPRESSIONS     1. Left ventricular ejection fraction, by estimation, is 45 to 50%. The  left ventricle has mildly decreased function. The left ventricle  demonstrates global hypokinesis. There is mild left ventricular  hypertrophy.   2. Right ventricular systolic function is  normal. The right ventricular  size is normal.   3. The inferior vena cava is normal in size with greater than 50%  respiratory variability, suggesting right atrial pressure of 3 mmHg.   4. Limited echo evaluate LV function   Carotid Dopplers: 04/2024 Summary:  Right Carotid: Velocities in the right ICA are consistent with a 1-39%   stenosis.                Non-hemodynamically significant plaque <50% noted in the  CCA. The                 ECA appears <50% stenosed. Patent right endarterectomy  site.   Left Carotid: Velocities in the left ICA are consistent with a 1-39%  stenosis.               Non-hemodynamically significant plaque <50% noted in the  CCA. The                ECA appears <50% stenosed. Patent left endarterectomy site.    Physical Exam:   VS:  BP 112/70 (BP Location: Right Arm, Cuff Size: Normal)   Pulse 62   Ht 6' (1.829 m)   Wt 214 lb (97.1 kg)   SpO2 92%   BMI 29.02 kg/m    Wt Readings from Last 3 Encounters:  08/08/24 214 lb (97.1 kg)  05/22/24 217 lb (98.4 kg)  05/01/24 216 lb (98 kg)     GEN: Well nourished, well developed male appearing in no acute distress NECK: No JVD; No carotid bruits CARDIAC: RRR, no murmurs, rubs, gallops RESPIRATORY:  Clear to auscultation without rales, wheezing or rhonchi  ABDOMEN: Appears non-distended. No obvious abdominal masses. EXTREMITIES: No clubbing or cyanosis. No pitting edema.  Distal pedal pulses are 2+ bilaterally.   Assessment and Plan:   1. Heart failure with mildly reduced ejection fraction (HFmrEF, 41-49%) (HCC) - Most recent limited echocardiogram in 04/2024 showed his EF was mildly reduced at 45 to 50% with global hypokinesis and RV function was normal.  - He denies any recent respiratory issues and appears euvolemic by examination today. Will continue current GDMT with Imdur  60 mg daily, Jardiance  10 mg daily, Losartan  50 mg daily and Toprol -XL 25 mg daily.   2. Coronary artery disease involving native coronary artery of native heart without angina pectoris - Cardiac catheterization in 02/2018 showed mild, nonobstructive disease as outlined above and medical management was recommended.  - He reports episodes of a bee sting sensation this past Wednesday as outlined above but symptoms occurred while sitting and he has been active  since without any recurrence. Overall, seems atypical for a cardiac etiology based off his description. EKG today shows known LBBB. Reviewed options with the patient and will hold off on further testing at this time. I encouraged him to make us  aware if he develops recurrent symptoms or exertional pain as we could arrange for a Coronary CTA for ischemic evaluation. - Continue current medical therapy with ASA 81 mg daily, Plavix  75 mg daily (on this per Neurology), Imdur  60 mg daily, Toprol -XL 25 mg daily and Crestor  20 mg daily.  3. Bilateral carotid artery stenosis - He previously underwent bilateral CEAs and carotid dopplers in 04/2024 showed patent sites as outlined above. Remains on ASA and statin therapy.  4. Essential hypertension - BP is well-controlled at 112/70 during today's visit. Continue current medical therapy with Amlodipine  10 mg daily, Imdur  60 mg daily, Losartan  50 mg daily and Toprol -XL 25  mg daily.  5. Hyperlipidemia LDL goal <70 - LDL was at 38 when checked in 03/2024. Continue current medical therapy with Crestor  20 mg daily.   Signed, Laymon CHRISTELLA Qua, PA-C

## 2024-08-11 ENCOUNTER — Institutional Professional Consult (permissible substitution): Admitting: Neurology

## 2024-08-20 NOTE — Progress Notes (Signed)
 Seth Savage                                          MRN: [id number]   08/20/2024   The VBCI Quality Team Specialist reviewed this patient medical record for the purposes of chart review for care gap closure. The following were reviewed: abstraction for care gap closure-glycemic status assessment.    VBCI Quality Team

## 2024-08-28 DIAGNOSIS — I10 Essential (primary) hypertension: Secondary | ICD-10-CM | POA: Diagnosis not present

## 2024-08-28 DIAGNOSIS — Z299 Encounter for prophylactic measures, unspecified: Secondary | ICD-10-CM | POA: Diagnosis not present

## 2024-08-28 DIAGNOSIS — E039 Hypothyroidism, unspecified: Secondary | ICD-10-CM | POA: Diagnosis not present

## 2024-08-28 DIAGNOSIS — Z23 Encounter for immunization: Secondary | ICD-10-CM | POA: Diagnosis not present

## 2024-09-24 ENCOUNTER — Encounter: Payer: Self-pay | Admitting: Emergency Medicine

## 2024-09-24 ENCOUNTER — Ambulatory Visit
Admission: EM | Admit: 2024-09-24 | Discharge: 2024-09-24 | Disposition: A | Discharge status: Home / Self Care | Attending: Family Medicine | Admitting: Family Medicine

## 2024-09-24 DIAGNOSIS — S39012A Strain of muscle, fascia and tendon of lower back, initial encounter: Secondary | ICD-10-CM | POA: Diagnosis not present

## 2024-09-24 MED ORDER — TIZANIDINE HCL 2 MG PO CAPS: 2.0000 mg | ORAL_CAPSULE | Freq: Three times a day (TID) | ORAL | 0 refills | Status: AC | PRN

## 2024-09-24 MED ORDER — PREDNISONE 20 MG PO TABS: 20.0000 mg | ORAL_TABLET | Freq: Every day | ORAL | 0 refills | Status: AC

## 2024-09-24 NOTE — ED Provider Notes (Signed)
 [facility]    CSN: [id number] Arrival date & time: 09/24/24  1257      History   Chief Complaint No chief complaint on file.   HPI Seth Savage is a 73 y.o. male.   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  Denies radiation of pain down leg, weakness, numbness, tingling, bowel or bladder incontinence, saddle anesthesia, fever, urinary symptoms.  So far not trying anything over-the-counter for symptoms other than rest.    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   Carotid artery disease    Left CEA 2001 and right CEA 2004   Childhood asthma    COPD (chronic obstructive pulmonary disease) (HCC)    Essential hypertension    Fatty liver    GERD (gastroesophageal reflux disease)    Goiter    Radioactive iodine  treatment   H pylori ulcer 11/2014   Treated with Prevpac; documented eradication by biopsy in 2018   History of bronchitis    History of kidney stones    History of pneumonia as a child    Hypothyroidism    Migraine    Mixed hyperlipidemia    Neuropathy    Stroke [facility])     Patient Active Problem List   Diagnosis Date Noted   Snores 04/10/2024   Cerebrovascular accident (CVA) (HCC) 04/05/2024   COPD (chronic obstructive pulmonary disease) (HCC)    Carotid artery disease    Dysphagia 03/07/2021   Fatty liver 09/03/2020   Loose stools 09/03/2020   Heme positive stool 05/13/2018   Abdominal pain, epigastric 05/13/2018   Acute URI 05/02/2018   Adult body mass index 28.0-28.9 05/02/2018   Allergic rhinitis 05/02/2018   Elevated PSA 05/02/2018   Screening for prostate cancer 05/02/2018   Fatigue 05/02/2018   Former smoker 05/02/2018   Gastroesophageal reflux disease  05/02/2018   History of hay fever 05/02/2018   History of migraine headaches 05/02/2018   History of hypothyroidism 05/02/2018   History of renal calculi 05/02/2018   Hypercholesterolemia 05/02/2018   Hypothyroid 05/02/2018   Kidney stone 05/02/2018   Migraine 05/02/2018   Screening for depression 05/02/2018   Biliary colic 04/25/2018   Ventricular tachyarrhythmia (HCC) 04/24/2018   Gallbladder sludge    VT (ventricular tachycardia) (HCC)    Coronary artery disease due to lipid rich plaque    S/P laparoscopic cholecystectomy    Unstable angina (HCC) 03/12/2018   History of colonic polyps 03/23/2017   RUQ pain 02/07/2017   Asymptomatic carotid artery stenosis 07/07/2016   Abnormal stress test    Coronary artery disease involving native coronary artery of native heart without angina pectoris    Tobacco use disorder 03/23/2015   Essential hypertension 03/23/2015   Chest pain 03/22/2015   History of peptic ulcer disease    PUD (peptic ulcer disease) 02/25/2015   Acute gastric ulcer    Reflux esophagitis    Headache 09/03/2013   Occlusion and stenosis of carotid artery without mention of cerebral infarction 12/12/2012   LUQ pain 09/23/2012   Hematochezia 09/23/2012    Past Surgical History:  Procedure Laterality Date   BIOPSY  05/01/2017   Procedure: BIOPSY;  Surgeon: Shaaron Lamar HERO, MD;  Location: [facility];  Service: Endoscopy;;  gastric   BIOPSY  08/19/2018   Procedure: BIOPSY;  Surgeon: Shaaron Lamar HERO, MD;  Location: [facility];  Service: Endoscopy;;  duodenal and gastric   BIOPSY  11/08/2023   Procedure: BIOPSY;  Surgeon: Shaaron Lamar HERO, MD;  Location: [facility];  Service: Endoscopy;;   CARDIAC CATHETERIZATION N/A 07/05/2016   Procedure: Left Heart Cath and Coronary Angiography;  Surgeon: Lonni JONETTA Cash, MD;  Location: [facility] INVASIVE CV LAB;  Service: Cardiovascular;  Laterality: N/A;   CARDIAC CATHETERIZATION  03/12/2018   CAROTID ENDARTERECTOMY Left  ?2009   CARPAL TUNNEL RELEASE Right    CHOLECYSTECTOMY N/A 04/24/2018   Procedure: LAPAROSCOPIC CHOLECYSTECTOMY;  Surgeon: Mavis Anes, MD;  Location: [facility];  Service: General;  Laterality: N/A;   COLECTOMY  2000   Secondary to intussuception   COLONOSCOPY  08/24/2004   Normal rectum,Small polyp at 25 cm, cold snared/ The remainder of the colonic mucosa appeared normal   COLONOSCOPY  09/24/2012   Dr. Lenox hemorrhoids-likely source of hematochezia.Colonic diverticulosis   COLONOSCOPY N/A 05/01/2017   Dr. Shaaron: Diverticulosis, next colonoscopy in 5 years.   COLONOSCOPY WITH PROPOFOL  N/A 04/28/2019   Propofol ; Dr. Shaaron; moderate, medium sized grade 1 internal hemorrhoids, diverticulosis in sigmoid and descending colon, otherwise normal.  Repeat in 5 years.   COLONOSCOPY WITH PROPOFOL  N/A 11/08/2023   Procedure: COLONOSCOPY WITH PROPOFOL ;  Surgeon: Shaaron Lamar HERO, MD;  Location: [facility];  Service: Endoscopy;  Laterality: N/A;  12:45 pm .asa 2   ENDARTERECTOMY Right 07/07/2016   Procedure: ENDARTERECTOMY CAROTID;  Surgeon: Krystal JULIANNA Doing, MD;  Location[facility] OR;  Service: Vascular;  Laterality: Right;   ESOPHAGOGASTRODUODENOSCOPY N/A 12/09/2014   Dr. Shaaron: Erosive reflux esophagitis. Peptic Ulcer disease secondary to H.pylori. small hiatal hernia. Nonbleeding duodenal AVM. Duodenal erosions. TREATED WITH PREVPAC   ESOPHAGOGASTRODUODENOSCOPY N/A 03/15/2015   Dr. Shaaron: small hiatal hernia, healed PUD, duodenal AVM.    ESOPHAGOGASTRODUODENOSCOPY N/A 05/01/2017   Dr. Shaaron: LA grade a esophagitis, gastritis, no H pylori   ESOPHAGOGASTRODUODENOSCOPY (EGD) WITH PROPOFOL  N/A 08/19/2018   PROPOFOL ;  Surgeon: Shaaron Lamar HERO, MD; normal esophagus, mild erythema and erosions in the entire stomach, small hiatal hernia, subtle white papular areas and duodenal bulb second and third portions of the duodenum.   FRACTURE SURGERY     LEFT HEART CATH AND CORONARY ANGIOGRAPHY N/A 03/12/2018   Procedure:  LEFT HEART CATH AND CORONARY ANGIOGRAPHY;  Surgeon: Cash Lonni JONETTA, MD;  Location: [facility] INVASIVE CV LAB;  Service: Cardiovascular;  Laterality: N/A;   Multiple bilateral arm/wrist surgeries after falls     ORIF SHOULDER FRACTURE Left 06/2002   shattered; put a bunch of metal plates in it   [facility] ANGIOPLASTY Right 07/07/2016   Procedure: PATCH ANGIOPLASTY USING 0.8CM X 7.6CM HEMASHIELD PATCH;  Surgeon: Krystal JULIANNA Doing, MD;  Location[facility] OR;  Service: Vascular;  Laterality: Right;   SHOULDER ARTHROSCOPY W/ ROTATOR CUFF REPAIR Right 1999   SHOULDER OPEN ROTATOR CUFF REPAIR Left 1992   Metal implant        Home Medications    Prior to Admission medications   Medication Sig Start Date End Date Taking? Authorizing Provider  predniSONE  (DELTASONE ) 20 MG tablet Take 1 tablet (20 mg total) by mouth daily with breakfast. 09/24/24  Yes Stuart Vernell Norris, PA-C  tizanidine  (ZANAFLEX ) 2 MG capsule Take 1 capsule (2 mg total) by mouth 3 (three) times daily as needed for muscle  spasms. Do not drink alcohol  drive while taking this medication.  May cause drowsiness. 09/24/24  Yes Stuart Vernell Norris, PA-C  amLODipine  (NORVASC ) 10 MG tablet TAKE 1 TABLET BY MOUTH EVERY DAY 04/28/24   Debera Jayson MATSU, MD  aspirin  EC 81 MG tablet Take 81 mg by mouth at bedtime.     [provider]  azelastine  (ASTELIN ) 0.1 % nasal spray Place 1 spray into both nostrils 2 (two) times daily. Use in each nostril as directed 04/01/24   Stuart Vernell Norris, PA-C  clopidogrel  (PLAVIX ) 75 MG tablet Take 1 tablet (75 mg total) by mouth daily. 04/10/24 04/05/25  Onita Duos, MD  isosorbide  mononitrate (IMDUR ) 60 MG 24 hr tablet TAKE 1 TABLET BY MOUTH DAILY 01/25/24   Debera Jayson MATSU, MD  JARDIANCE  10 MG TABS tablet Take 10 mg by mouth daily. 04/07/24   [provider]  levothyroxine  (SYNTHROID ) 150 MCG tablet Take 150 mcg by mouth daily. 07/17/24   [provider]  losartan  (COZAAR ) 50 MG tablet  TAKE 1 TABLET BY MOUTH EVERY DAY 04/28/24   Debera Jayson MATSU, MD  metoprolol  succinate (TOPROL -XL) 25 MG 24 hr tablet TAKE 1 TABLET BY MOUTH EVERY DAY 04/28/24   Debera Jayson MATSU, MD  nitroGLYCERIN  (NITROSTAT ) 0.4 MG SL tablet DISSOLVE 1 TABLET UNDER THE TONGUE EVERY 5 MINUTES AS NEEDED FOR CHEST PAIN. DO NOT EXCEED A TOTAL OF 3 DOSES IN 15 MINUTES. 07/03/24   Strader, Brittany M, PA-C  pantoprazole  (PROTONIX ) 40 MG tablet Take 40 mg by mouth daily.    [provider]  rosuvastatin  (CRESTOR ) 20 MG tablet Take 1 tablet (20 mg total) by mouth daily. 05/01/24 08/08/24  Johnson Laymon HERO, PA-C    Family History Family History  Problem Relation Age of Onset   COPD Mother    Mental illness Mother    Hypertension Mother    Varicose Veins Mother    Heart attack Mother 12   Hypertension Father    Alcohol  abuse Father    Colon cancer Neg Hx     Social History Social History   Tobacco Use   Smoking status: Former    Current packs/day: 0.00    Average packs/day: 0.2 packs/day for 45.0 years (9.0 ttl pk-yrs)    Types: Cigarettes    Start date: 12/21/1972    Quit date: 12/21/2017    Years since quitting: 6.7   Smokeless tobacco: Never  Vaping Use   Vaping status: Former  Substance Use Topics   Alcohol  use: Yes    Alcohol /week: 0.0 standard drinks of alcohol     Comment: 1-2 beers in 6 months.    Drug use: No     Allergies   Lyrica [pregabalin], Celecoxib, and Codeine   Review of Systems Review of Systems Per HPI  Physical Exam Triage Vital Signs ED Triage Vitals  Encounter Vitals Group     BP 09/24/24 1306 128/70     Girls Systolic BP Percentile --      Girls Diastolic BP Percentile --      Boys Systolic BP Percentile --      Boys Diastolic BP Percentile --      Pulse Rate 09/24/24 1306 96     Resp 09/24/24 1306 18     Temp 09/24/24 1306 98.1 F (36.7 C)     Temp Source 09/24/24 1306 Oral     SpO2 09/24/24 1306 93 %     Weight --      Height --  Head  Circumference --      Peak Flow --      Pain Score 09/24/24 1307 7     Pain Loc --      Pain Education --      Exclude from Growth Chart --    No data found.  Updated Vital Signs BP 128/70 (BP Location: Right Arm)   Pulse 96   Temp 98.1 F (36.7 C) (Oral)   Resp 18   SpO2 93%   Visual Acuity Right Eye Distance:   Left Eye Distance:   Bilateral Distance:    Right Eye Near:   Left Eye Near:    Bilateral Near:     Physical Exam Vitals and nursing note reviewed.  Constitutional:      Appearance: Normal appearance.  HENT:     Head: Atraumatic.  Eyes:     Extraocular Movements: Extraocular movements intact.     Conjunctiva/sclera: Conjunctivae normal.  Cardiovascular:     Rate and Rhythm: Normal rate.  Pulmonary:     Effort: Pulmonary effort is normal.  Musculoskeletal:        General: Tenderness present. Normal range of motion.     Cervical back: Normal range of motion and neck supple.     Comments: Right lateral lumbar, posterior buttock tenderness to palpation without bony deformity.  No midline spinal tenderness to palpation diffusely.  Negative straight leg raise bilateral lower extremity.  Right lower extremity range of motion intact  Skin:    General: Skin is warm and dry.  Neurological:     Mental Status: He is oriented to person, place, and time.     Motor: No weakness.     Gait: Gait normal.     Comments: Bilateral lower extremities neurovascularly intact  Psychiatric:        Mood and Affect: Mood normal.        Thought Content: Thought content normal.        Judgment: Judgment normal.      UC Treatments / Results  Labs (all labs ordered are listed, but only abnormal results are displayed) Labs Reviewed - No data to display  EKG   Radiology No results found.  Procedures Procedures (including critical care time)  Medications Ordered in UC Medications - No data to display  Initial Impression / Assessment and Plan / UC Course  I have  reviewed the triage vital signs and the nursing notes.  Pertinent labs & imaging results that were available during my care of the patient were reviewed by me and considered in my medical decision making (see chart for details).     Consistent with muscular strain.  Very low suspicion for bony abnormality, x-ray imaging deferred with shared decision making.  Will treat with Zanaflex , low-dose prednisone , heat, massage, stretches, rest.  Work note given.  Return for worsening or unresolving symptoms.  Final Clinical Impressions(s) / UC Diagnoses   Final diagnoses:  Strain of lumbar region, initial encounter   Discharge Instructions   None    ED Prescriptions     Medication Sig Dispense Auth. Provider   tizanidine  (ZANAFLEX ) 2 MG capsule Take 1 capsule (2 mg total) by mouth 3 (three) times daily as needed for muscle spasms. Do not drink alcohol  drive while taking this medication.  May cause drowsiness. 15 capsule Stuart Vernell Norris, PA-C   predniSONE  (DELTASONE ) 20 MG tablet Take 1 tablet (20 mg total) by mouth daily with breakfast. 5 tablet Stuart Vernell Norris, PA-C  PDMP not reviewed this encounter.   Stuart Vernell Norris, [place] 09/24/24 1411

## 2024-09-24 NOTE — ED Triage Notes (Signed)
 Right hip pain and lower back pain since yesterday.  States has been pushing and pulling a heavy person in a wheelchair and felt the pain after doing this

## 2024-11-10 ENCOUNTER — Telehealth: Payer: Self-pay | Admitting: Cardiology

## 2024-11-10 NOTE — Telephone Encounter (Signed)
 Will forward to med assist

## 2024-11-10 NOTE — Telephone Encounter (Signed)
 Pt c/o medication issue:  1. Name of Medication: JARDIANCE  10 MG TABS tablet   2. How are you currently taking this medication (dosage and times per day)?    3. Are you having a reaction (difficulty breathing--STAT)? No   4. What is your medication issue? Patient states that he is unable to afford medication. Calling to see what options they are. Please advise

## 2024-11-11 NOTE — Telephone Encounter (Signed)
 Noted and thank you

## 2024-12-07 NOTE — Progress Notes (Unsigned)
 "  Cardiology Office Note    Date:  12/10/2024  ID:  Seth Savage, DOB Apr 03, 1951, MRN [id number] Cardiologist: Jayson Sierras, MD Cardiology APP:  Johnson Laymon HERO, PA-C { :  History of Present Illness:    Seth Savage is a 74 y.o. 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  He was last examined by myself in 07/2024 and was remaining active at baseline and denied any specific anginal symptoms. He appeared euvolemic by examination and was continued on his current medical therapy with Amlodipine  10 mg daily, ASA 81 mg daily, Plavix  75 mg daily, Jardiance  10 mg daily, Imdur  60 mg daily, Losartan  50 mg daily, Toprol -XL 25 mg daily and Crestor  20 mg daily.  In talking with the patient today, he reports feeling great since his last office visit. He denies any exertional chest pain or dyspnea on exertion. He does report an occasional bee sting sensation along his chest and this typically occurs when he is stressed at work. 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 He denies any specific orthopnea, PND or pitting edema. Says his shortness of breath significantly improved after he quit smoking 12+ years ago. Reports good compliance with his current medications  Studies Reviewed:   EKG: EKG is not ordered today.  Limited Echo: 04/2024 IMPRESSIONS     1. Left ventricular ejection fraction, by estimation, is 45 to 50%. The  left ventricle has mildly decreased function. The left ventricle  demonstrates global hypokinesis. There is mild left ventricular  hypertrophy.   2. Right ventricular systolic function is normal. The right ventricular  size is normal.   3. The inferior vena cava is normal in size with greater than 50%   respiratory variability, suggesting right atrial pressure of 3 mmHg.   4. Limited echo evaluate LV function    Physical Exam:   VS:  BP 118/72   Pulse 71   Ht 6' (1.829 m)   Wt 217 lb 3.2 oz (98.5 kg)   SpO2 95%   BMI 29.46 kg/m    Wt Readings from Last 3 Encounters:  12/09/24 217 lb 3.2 oz (98.5 kg)  08/08/24 214 lb (97.1 kg)  05/22/24 217 lb (98.4 kg)     GEN: Pleasant male appearing in no acute distress NECK: No JVD; No carotid bruits CARDIAC: RRR, no murmurs, rubs, gallops RESPIRATORY:  Clear to auscultation without rales, wheezing or rhonchi  ABDOMEN: Appears non-distended. No obvious abdominal masses. EXTREMITIES: No clubbing or cyanosis. No pitting edema.  Distal pedal pulses are 2+ bilaterally.   Assessment and Plan:   1. Heart failure with mildly reduced ejection fraction (HFmrEF, 41-49%) (HCC) - His EF was mildly reduced at 40 to 45% by echocardiogram in 03/2024 and at 45 to 50% by echocardiogram in 04/2024. He reports overall feeling well and denies any respiratory issues. Appears euvolemic today. - Will continue current medical therapy with Imdur  60 mg daily, Jardiance  10 mg daily, Losartan  50 mg daily and Toprol -XL 25 mg daily. Would arrange for a follow-up echocardiogram around the time of his next visit for reassessment of his EF. Based off results, could consider switching Losartan  to Entresto.  2. Coronary artery disease involving native coronary artery of native heart without angina pectoris - Prior cardiac catheterization  in 02/2018 showed mild, nonobstructive disease. He denies any recent exertional chest pain and has overall been feeling well.  - Continue current medical therapy for now with ASA 81 mg daily, Plavix  75 mg daily (on this per Neurology), Toprol -XL 25 mg daily, Crestor  20 mg daily and Imdur  60 mg daily. 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  3. Essential  hypertension - His blood pressure is well-controlled at 118/72 during today's visit. Continue current medical therapy with Amlodipine  10 mg daily, Imdur  60 mg daily, Losartan  50 mg daily and Toprol -XL 25 mg daily.  4. Hyperlipidemia LDL goal <70 - FLP in 03/2024 showed his total cholesterol was at 99 with LDL at 38. Continue current medical therapy with Crestor  20 mg daily.  5. Bilateral carotid artery stenosis - He previously underwent bilateral CEA's and most recent carotid dopplers in 04/2024 showed patent sites with 1 to 39% stenosis bilaterally. Continue ASA and statin therapy.   Signed, Laymon CHRISTELLA Qua, PA-C   "

## 2024-12-09 ENCOUNTER — Encounter: Payer: Self-pay | Admitting: Student

## 2024-12-09 ENCOUNTER — Ambulatory Visit: Attending: Student | Admitting: Student

## 2024-12-09 VITALS — BP 118/72 | HR 71 | Ht 72.0 in | Wt 217.2 lb

## 2024-12-09 DIAGNOSIS — I6523 Occlusion and stenosis of bilateral carotid arteries: Secondary | ICD-10-CM | POA: Diagnosis not present

## 2024-12-09 DIAGNOSIS — I5020 Unspecified systolic (congestive) heart failure: Secondary | ICD-10-CM | POA: Diagnosis not present

## 2024-12-09 DIAGNOSIS — I2510 Atherosclerotic heart disease of native coronary artery without angina pectoris: Secondary | ICD-10-CM | POA: Diagnosis not present

## 2024-12-09 DIAGNOSIS — E785 Hyperlipidemia, unspecified: Secondary | ICD-10-CM

## 2024-12-09 DIAGNOSIS — I10 Essential (primary) hypertension: Secondary | ICD-10-CM | POA: Diagnosis not present

## 2024-12-09 MED ORDER — ISOSORBIDE MONONITRATE ER 60 MG PO TB24: 60.0000 mg | ORAL_TABLET | Freq: Every day | ORAL | 3 refills | Status: AC

## 2024-12-09 MED ORDER — AMLODIPINE BESYLATE 10 MG PO TABS: 10.0000 mg | ORAL_TABLET | Freq: Every day | ORAL | 3 refills | Status: AC

## 2024-12-09 MED ORDER — ROSUVASTATIN CALCIUM 20 MG PO TABS: 20.0000 mg | ORAL_TABLET | Freq: Every day | ORAL | 3 refills | Status: AC

## 2024-12-09 MED ORDER — LOSARTAN POTASSIUM 50 MG PO TABS: 50.0000 mg | ORAL_TABLET | Freq: Every day | ORAL | 3 refills | Status: AC

## 2024-12-09 MED ORDER — METOPROLOL SUCCINATE ER 25 MG PO TB24: 25.0000 mg | ORAL_TABLET | Freq: Every day | ORAL | 3 refills | Status: AC

## 2024-12-09 NOTE — Patient Instructions (Signed)
 Medication Instructions:  Your physician recommends that you continue on your current medications as directed. Please refer to the Current Medication list given to you today.  *If you need a refill on your cardiac medications before your next appointment, please call your pharmacy*  Lab Work: NONE   If you have labs (blood work) drawn today and your tests are completely normal, you will receive your results only by: MyChart Message (if you have MyChart) OR A paper copy in the mail If you have any lab test that is abnormal or we need to change your treatment, we will call you to review the results.  Testing/Procedures: NONE   Follow-Up: At [facility], you and your health needs are our priority.  As part of our continuing mission to provide you with exceptional heart care, our providers are all part of one team.  This team includes your primary Cardiologist (physician) and Advanced Practice Providers or APPs (Physician Assistants and Nurse Practitioners) who all work together to provide you with the care you need, when you need it.  Your next appointment:   6 month(s)  Provider:   Jayson Sierras, MD or Laymon Qua, PA-C    We recommend signing up for the patient portal called MyChart.  Sign up information is provided on this After Visit Summary.  MyChart is used to connect with patients for Virtual Visits (Telemedicine).  Patients are able to view lab/test results, encounter notes, upcoming appointments, etc.  Non-urgent messages can be sent to your provider as well.   To learn more about what you can do with MyChart, go to [email or web address].   Other Instructions Thank you for choosing [facility]!
